# Patient Record
Sex: Female | Born: 1950
Health system: Southern US, Community
[De-identification: ages and names within clinical notes are randomized; demographics above are authoritative.]

## PROBLEM LIST (undated history)

## (undated) DIAGNOSIS — G4730 Sleep apnea, unspecified: Secondary | ICD-10-CM

## (undated) DIAGNOSIS — K219 Gastro-esophageal reflux disease without esophagitis: Secondary | ICD-10-CM

## (undated) DIAGNOSIS — I10 Essential (primary) hypertension: Secondary | ICD-10-CM

## (undated) DIAGNOSIS — F419 Anxiety disorder, unspecified: Secondary | ICD-10-CM

## (undated) DIAGNOSIS — Z789 Other specified health status: Secondary | ICD-10-CM

## (undated) DIAGNOSIS — G20A1 Parkinson's disease without dyskinesia, without mention of fluctuations: Secondary | ICD-10-CM

## (undated) DIAGNOSIS — E079 Disorder of thyroid, unspecified: Secondary | ICD-10-CM

## (undated) DIAGNOSIS — E785 Hyperlipidemia, unspecified: Secondary | ICD-10-CM

## (undated) DIAGNOSIS — E119 Type 2 diabetes mellitus without complications: Secondary | ICD-10-CM

## (undated) DIAGNOSIS — G20 Parkinson's disease: Secondary | ICD-10-CM

## (undated) DIAGNOSIS — M1990 Unspecified osteoarthritis, unspecified site: Secondary | ICD-10-CM

## (undated) HISTORY — PX: THYROIDECTOMY: SHX17

## (undated) HISTORY — PX: LAPAROSCOPIC HYSTERECTOMY: SHX1926

## (undated) HISTORY — PX: TONSILLECTOMY: SUR1361

## (undated) HISTORY — DX: Sleep apnea, unspecified: G47.30

## (undated) HISTORY — PX: CHOLECYSTECTOMY: SHX55

## (undated) HISTORY — DX: Parkinson's disease: G20

## (undated) HISTORY — DX: Anxiety disorder, unspecified: F41.9

## (undated) HISTORY — PX: GALLBLADDER SURGERY: SHX652

## (undated) HISTORY — DX: Unspecified osteoarthritis, unspecified site: M19.90

## (undated) HISTORY — PX: CATARACT EXTRACTION: SUR2

## (undated) HISTORY — DX: Parkinson's disease without dyskinesia, without mention of fluctuations: G20.A1

## (undated) HISTORY — DX: Disorder of thyroid, unspecified: E07.9

## (undated) HISTORY — DX: Type 2 diabetes mellitus without complications: E11.9

## (undated) HISTORY — PX: ABDOMINAL HYSTERECTOMY: SHX81

## (undated) HISTORY — DX: Gastro-esophageal reflux disease without esophagitis: K21.9

## (undated) HISTORY — DX: Essential (primary) hypertension: I10

## (undated) HISTORY — DX: Hyperlipidemia, unspecified: E78.5

---

## 2006-10-10 ENCOUNTER — Ambulatory Visit: Payer: Self-pay | Admitting: Gastroenterology

## 2006-11-28 ENCOUNTER — Ambulatory Visit: Payer: Self-pay | Admitting: Internal Medicine

## 2007-01-10 ENCOUNTER — Ambulatory Visit: Payer: Self-pay | Admitting: Internal Medicine

## 2007-11-22 ENCOUNTER — Ambulatory Visit: Payer: Self-pay | Admitting: Internal Medicine

## 2007-12-04 ENCOUNTER — Ambulatory Visit: Payer: Self-pay | Admitting: Internal Medicine

## 2007-12-04 ENCOUNTER — Ambulatory Visit: Payer: Self-pay

## 2007-12-10 ENCOUNTER — Ambulatory Visit: Payer: Self-pay

## 2008-05-25 ENCOUNTER — Ambulatory Visit: Payer: Self-pay

## 2008-06-11 ENCOUNTER — Ambulatory Visit: Payer: Self-pay | Admitting: Otolaryngology

## 2008-06-17 ENCOUNTER — Ambulatory Visit: Payer: Self-pay | Admitting: Otolaryngology

## 2008-06-18 ENCOUNTER — Ambulatory Visit: Payer: Self-pay | Admitting: Otolaryngology

## 2008-12-23 ENCOUNTER — Ambulatory Visit: Payer: Self-pay | Admitting: Otolaryngology

## 2009-03-19 ENCOUNTER — Ambulatory Visit: Payer: Self-pay | Admitting: Internal Medicine

## 2009-06-14 ENCOUNTER — Ambulatory Visit: Payer: Self-pay | Admitting: Otolaryngology

## 2009-10-21 ENCOUNTER — Ambulatory Visit: Payer: Self-pay | Admitting: Gastroenterology

## 2010-03-21 ENCOUNTER — Ambulatory Visit: Payer: Self-pay | Admitting: Internal Medicine

## 2010-06-22 ENCOUNTER — Ambulatory Visit: Payer: Self-pay | Admitting: Otolaryngology

## 2011-03-23 ENCOUNTER — Ambulatory Visit: Payer: Self-pay | Admitting: Internal Medicine

## 2011-06-23 ENCOUNTER — Ambulatory Visit: Payer: Self-pay | Admitting: Otolaryngology

## 2012-11-20 ENCOUNTER — Encounter: Payer: Self-pay | Admitting: Podiatry

## 2012-11-20 ENCOUNTER — Ambulatory Visit (INDEPENDENT_AMBULATORY_CARE_PROVIDER_SITE_OTHER): Payer: BC Managed Care – PPO | Admitting: Podiatry

## 2012-11-20 VITALS — BP 102/53 | HR 75 | Resp 16 | Ht 64.0 in | Wt 320.0 lb

## 2012-11-20 DIAGNOSIS — M79609 Pain in unspecified limb: Secondary | ICD-10-CM

## 2012-11-20 DIAGNOSIS — B351 Tinea unguium: Secondary | ICD-10-CM

## 2012-11-20 NOTE — Progress Notes (Signed)
Theresa Mills presents today with a chief complaint of painful toenails one through 5 bilaterally. She denies fever chills nausea vomiting muscle aches or pains.  Objective: Vital signs are stable she is alert and oriented x3. There is no erythema edema saline is drainage or odor. Pulses remain palpable bilateral foot. Nails are thick yellow dystrophic clinically mycotic.  Assessment: Pain in limb secondary to onychomycosis.  Plan: Debridement of nails 1 through 5 bilateral covered service secondary to pain. Followup with her in 3 months.

## 2013-01-20 ENCOUNTER — Ambulatory Visit (INDEPENDENT_AMBULATORY_CARE_PROVIDER_SITE_OTHER): Payer: BC Managed Care – PPO | Admitting: Podiatry

## 2013-01-20 ENCOUNTER — Encounter: Payer: Self-pay | Admitting: Podiatry

## 2013-01-20 VITALS — BP 121/68 | HR 70 | Resp 16 | Ht 64.0 in | Wt 320.8 lb

## 2013-01-20 DIAGNOSIS — B351 Tinea unguium: Secondary | ICD-10-CM

## 2013-01-20 DIAGNOSIS — M79609 Pain in unspecified limb: Secondary | ICD-10-CM

## 2013-01-20 NOTE — Progress Notes (Signed)
She presents today with a chief complaint of painful elongated toenails bilateral.  Objective: Vital signs are stable she is alert and oriented x3. Pulses remain palpable bilateral. Nails are thick yellow dystrophic lytic mycotic and painful palpation.  Assessment: Pain in limb secondary to onychomycosis 1 through 5 bilateral.  Plan: Debridement of nails 1 through 5 bilateral is cover service secondary to pain.

## 2013-03-24 ENCOUNTER — Encounter: Payer: Self-pay | Admitting: Podiatry

## 2013-03-24 ENCOUNTER — Ambulatory Visit (INDEPENDENT_AMBULATORY_CARE_PROVIDER_SITE_OTHER): Payer: BC Managed Care – PPO | Admitting: Podiatry

## 2013-03-24 VITALS — BP 113/67 | HR 81 | Resp 18

## 2013-03-24 DIAGNOSIS — L600 Ingrowing nail: Secondary | ICD-10-CM

## 2013-03-24 DIAGNOSIS — B351 Tinea unguium: Secondary | ICD-10-CM

## 2013-03-24 DIAGNOSIS — M79609 Pain in unspecified limb: Secondary | ICD-10-CM

## 2013-03-24 NOTE — Progress Notes (Signed)
Nails and calluses they are hurting about to have been trimmed.  Objective: Vital signs are stable she is alert and oriented x3. Nails are thick yellow dystrophic onychomycotic and painful palpation as well as as debridement.  Assessment: Pain in limb secondary to onychomycosis 1 through 5 bilateral.  Plan: Debridement of nails 1 through 5 bilateral covered service secondary to pain.

## 2013-04-10 ENCOUNTER — Ambulatory Visit: Payer: Self-pay | Admitting: Physician Assistant

## 2013-06-30 ENCOUNTER — Ambulatory Visit (INDEPENDENT_AMBULATORY_CARE_PROVIDER_SITE_OTHER): Payer: BC Managed Care – PPO | Admitting: Podiatry

## 2013-06-30 ENCOUNTER — Encounter: Payer: Self-pay | Admitting: Podiatry

## 2013-06-30 VITALS — BP 136/82 | HR 68 | Resp 16

## 2013-06-30 DIAGNOSIS — B351 Tinea unguium: Secondary | ICD-10-CM

## 2013-06-30 DIAGNOSIS — M79609 Pain in unspecified limb: Secondary | ICD-10-CM

## 2013-06-30 NOTE — Progress Notes (Signed)
She presents today with a chief complaint of painful elongated toenails.  Objective: Pulses are strongly palpable bilateral. Nails are thick yellow dystrophic onychomycotic and painful palpation.  Assessment: Pain in limb secondary to onychomycosis 1 through 5 bilateral.  Plan: Debridement of nails and all reactive hyperkeratotic tissue is cover service secondary to pain.

## 2013-08-22 ENCOUNTER — Ambulatory Visit (INDEPENDENT_AMBULATORY_CARE_PROVIDER_SITE_OTHER): Payer: BC Managed Care – PPO | Admitting: Podiatry

## 2013-08-22 ENCOUNTER — Encounter: Payer: Self-pay | Admitting: Podiatry

## 2013-08-22 DIAGNOSIS — M79609 Pain in unspecified limb: Secondary | ICD-10-CM

## 2013-08-22 DIAGNOSIS — B351 Tinea unguium: Secondary | ICD-10-CM

## 2013-08-22 DIAGNOSIS — M79676 Pain in unspecified toe(s): Secondary | ICD-10-CM

## 2013-08-23 NOTE — Progress Notes (Signed)
She presents today with a chief complaint of painful elongated toenails.  Objective: Nails are thick yellow dystrophic onychomycotic painful palpation. Nails are thick yellow dystrophic with mycotic painful palpation.  Assessment: Pain in limb secondary to onychomycosis 1 through 5 bilateral.  Plan: Debridement nails 1 through 5 bilateral.

## 2013-09-22 ENCOUNTER — Ambulatory Visit: Payer: BC Managed Care – PPO | Admitting: Podiatry

## 2013-10-08 ENCOUNTER — Ambulatory Visit (INDEPENDENT_AMBULATORY_CARE_PROVIDER_SITE_OTHER): Payer: BC Managed Care – PPO | Admitting: Podiatry

## 2013-10-08 DIAGNOSIS — B351 Tinea unguium: Secondary | ICD-10-CM

## 2013-10-08 DIAGNOSIS — M79676 Pain in unspecified toe(s): Secondary | ICD-10-CM

## 2013-10-08 DIAGNOSIS — M79609 Pain in unspecified limb: Secondary | ICD-10-CM

## 2013-10-08 NOTE — Progress Notes (Signed)
She presents today for a followup of her painful toenails. She informs me today that she has had Parkinson's disease for the past 2 years and her tremors are getting worse.  Objective: Vital signs are stable she is alert and oriented x3. She has pain on palpation of toenails one through 5 bilateral. Nails are thick yellow dystrophic onychomycotic and painful.  Assessment: Pain in limb secondary onychomycosis 1 through 5 bilateral.  Plan: Debridement of nails 1 through 5 bilateral covered service secondary to pain debridement all reactive hyperkeratosis.

## 2014-01-07 ENCOUNTER — Ambulatory Visit: Payer: BC Managed Care – PPO | Admitting: Podiatry

## 2014-01-12 DIAGNOSIS — M25561 Pain in right knee: Secondary | ICD-10-CM | POA: Diagnosis not present

## 2014-01-12 DIAGNOSIS — R05 Cough: Secondary | ICD-10-CM | POA: Diagnosis not present

## 2014-01-12 DIAGNOSIS — R55 Syncope and collapse: Secondary | ICD-10-CM | POA: Diagnosis not present

## 2014-01-12 DIAGNOSIS — W19XXXA Unspecified fall, initial encounter: Secondary | ICD-10-CM | POA: Diagnosis not present

## 2014-01-12 DIAGNOSIS — S0083XA Contusion of other part of head, initial encounter: Secondary | ICD-10-CM | POA: Diagnosis not present

## 2014-01-12 DIAGNOSIS — S0081XA Abrasion of other part of head, initial encounter: Secondary | ICD-10-CM | POA: Diagnosis not present

## 2014-01-12 DIAGNOSIS — R0602 Shortness of breath: Secondary | ICD-10-CM | POA: Diagnosis not present

## 2014-01-12 DIAGNOSIS — R079 Chest pain, unspecified: Secondary | ICD-10-CM | POA: Diagnosis not present

## 2014-01-12 LAB — COMPREHENSIVE METABOLIC PANEL
ALK PHOS: 45 U/L — AB
ALT: 24 U/L
ANION GAP: 8 (ref 7–16)
Albumin: 3.2 g/dL — ABNORMAL LOW (ref 3.4–5.0)
BUN: 20 mg/dL — ABNORMAL HIGH (ref 7–18)
Bilirubin,Total: 0.7 mg/dL (ref 0.2–1.0)
CO2: 28 mmol/L (ref 21–32)
Calcium, Total: 8.9 mg/dL (ref 8.5–10.1)
Chloride: 104 mmol/L (ref 98–107)
Creatinine: 0.42 mg/dL — ABNORMAL LOW (ref 0.60–1.30)
EGFR (Non-African Amer.): >60
Glucose: 111 mg/dL — ABNORMAL HIGH (ref 65–99)
OSMOLALITY: 283 (ref 275–301)
POTASSIUM: 4 mmol/L (ref 3.5–5.1)
SGOT(AST): 33 U/L (ref 15–37)
Sodium: 140 mmol/L (ref 136–145)
Total Protein: 7.3 g/dL (ref 6.4–8.2)

## 2014-01-12 LAB — CBC WITH DIFFERENTIAL/PLATELET
BASOS ABS: 0.1 10*3/uL (ref 0.0–0.1)
Basophil %: 0.6 %
Eosinophil #: 0.1 10*3/uL (ref 0.0–0.7)
Eosinophil %: 1 %
HCT: 48.3 % — ABNORMAL HIGH (ref 35.0–47.0)
HGB: 15.6 g/dL (ref 12.0–16.0)
Lymphocyte #: 2 10*3/uL (ref 1.0–3.6)
Lymphocyte %: 21.4 %
MCH: 30.3 pg (ref 26.0–34.0)
MCHC: 32.4 g/dL (ref 32.0–36.0)
MCV: 94 fL (ref 80–100)
Monocyte #: 0.6 x10 3/mm (ref 0.2–0.9)
Monocyte %: 7 %
NEUTROS PCT: 70 %
Neutrophil #: 6.4 10*3/uL (ref 1.4–6.5)
PLATELETS: 171 10*3/uL (ref 150–440)
RBC: 5.15 10*6/uL (ref 3.80–5.20)
RDW: 14 % (ref 11.5–14.5)
WBC: 9.1 10*3/uL (ref 3.6–11.0)

## 2014-01-12 LAB — LIPASE, BLOOD: LIPASE: 104 U/L (ref 73–393)

## 2014-01-12 LAB — TROPONIN I

## 2014-01-12 LAB — PROTIME-INR
INR: 1.1
Prothrombin Time: 14.3 secs (ref 11.5–14.7)

## 2014-01-12 LAB — MAGNESIUM: Magnesium: 1.8 mg/dL

## 2014-01-12 LAB — ETHANOL: Ethanol: <3 mg/dL

## 2014-01-13 ENCOUNTER — Inpatient Hospital Stay: Payer: Self-pay | Admitting: Specialist

## 2014-01-13 DIAGNOSIS — E785 Hyperlipidemia, unspecified: Secondary | ICD-10-CM | POA: Diagnosis present

## 2014-01-13 DIAGNOSIS — N390 Urinary tract infection, site not specified: Secondary | ICD-10-CM | POA: Diagnosis not present

## 2014-01-13 DIAGNOSIS — Z7983 Long term (current) use of bisphosphonates: Secondary | ICD-10-CM | POA: Diagnosis not present

## 2014-01-13 DIAGNOSIS — G2581 Restless legs syndrome: Secondary | ICD-10-CM | POA: Diagnosis present

## 2014-01-13 DIAGNOSIS — Z6841 Body Mass Index (BMI) 40.0 and over, adult: Secondary | ICD-10-CM | POA: Diagnosis not present

## 2014-01-13 DIAGNOSIS — E039 Hypothyroidism, unspecified: Secondary | ICD-10-CM | POA: Diagnosis not present

## 2014-01-13 DIAGNOSIS — R55 Syncope and collapse: Secondary | ICD-10-CM | POA: Diagnosis not present

## 2014-01-13 DIAGNOSIS — M8580 Other specified disorders of bone density and structure, unspecified site: Secondary | ICD-10-CM | POA: Diagnosis not present

## 2014-01-13 DIAGNOSIS — I951 Orthostatic hypotension: Secondary | ICD-10-CM | POA: Diagnosis present

## 2014-01-13 DIAGNOSIS — F418 Other specified anxiety disorders: Secondary | ICD-10-CM | POA: Diagnosis present

## 2014-01-13 DIAGNOSIS — Z8542 Personal history of malignant neoplasm of other parts of uterus: Secondary | ICD-10-CM | POA: Diagnosis not present

## 2014-01-13 DIAGNOSIS — B369 Superficial mycosis, unspecified: Secondary | ICD-10-CM | POA: Diagnosis present

## 2014-01-13 DIAGNOSIS — S0081XA Abrasion of other part of head, initial encounter: Secondary | ICD-10-CM | POA: Diagnosis not present

## 2014-01-13 DIAGNOSIS — Z8249 Family history of ischemic heart disease and other diseases of the circulatory system: Secondary | ICD-10-CM | POA: Diagnosis not present

## 2014-01-13 DIAGNOSIS — I10 Essential (primary) hypertension: Secondary | ICD-10-CM | POA: Diagnosis not present

## 2014-01-13 DIAGNOSIS — S0083XA Contusion of other part of head, initial encounter: Secondary | ICD-10-CM | POA: Diagnosis not present

## 2014-01-13 DIAGNOSIS — E119 Type 2 diabetes mellitus without complications: Secondary | ICD-10-CM | POA: Diagnosis not present

## 2014-01-13 DIAGNOSIS — R531 Weakness: Secondary | ICD-10-CM | POA: Diagnosis not present

## 2014-01-13 LAB — URINALYSIS, COMPLETE
BACTERIA: NONE SEEN
Bilirubin,UR: NEGATIVE
Glucose,UR: >=500 mg/dL (ref 0–75)
NITRITE: NEGATIVE
PH: 6 (ref 4.5–8.0)
Protein: NEGATIVE
RBC,UR: 40 /HPF (ref 0–5)
SPECIFIC GRAVITY: 1.021 (ref 1.003–1.030)
Squamous Epithelial: 13

## 2014-01-14 DIAGNOSIS — I10 Essential (primary) hypertension: Secondary | ICD-10-CM | POA: Diagnosis not present

## 2014-01-14 DIAGNOSIS — N390 Urinary tract infection, site not specified: Secondary | ICD-10-CM | POA: Diagnosis not present

## 2014-01-14 DIAGNOSIS — E039 Hypothyroidism, unspecified: Secondary | ICD-10-CM | POA: Diagnosis not present

## 2014-01-14 DIAGNOSIS — R531 Weakness: Secondary | ICD-10-CM | POA: Diagnosis not present

## 2014-01-14 DIAGNOSIS — R55 Syncope and collapse: Secondary | ICD-10-CM | POA: Diagnosis not present

## 2014-01-14 LAB — TSH: Thyroid Stimulating Horm: 3.04 u[IU]/mL

## 2014-01-14 LAB — URINE CULTURE

## 2014-01-14 LAB — HEMOGLOBIN A1C: Hemoglobin A1C: 6.3 % (ref 4.2–6.3)

## 2014-01-21 ENCOUNTER — Ambulatory Visit (INDEPENDENT_AMBULATORY_CARE_PROVIDER_SITE_OTHER): Payer: Medicare Other | Admitting: Podiatry

## 2014-01-21 DIAGNOSIS — E1142 Type 2 diabetes mellitus with diabetic polyneuropathy: Secondary | ICD-10-CM

## 2014-01-21 DIAGNOSIS — M79676 Pain in unspecified toe(s): Secondary | ICD-10-CM | POA: Diagnosis not present

## 2014-01-21 DIAGNOSIS — Q828 Other specified congenital malformations of skin: Secondary | ICD-10-CM

## 2014-01-21 DIAGNOSIS — B351 Tinea unguium: Secondary | ICD-10-CM

## 2014-01-21 NOTE — Progress Notes (Signed)
She presents today for routine nail debridement and slight C by getting a pair of diabetic shoes.  Objective: Vital signs are stable she is alert and oriented 3 pulses are diminished bilateral neurologic sensorium is decreased about per Semmes-Weinstein monofilament bilateral. She has a history of hammertoe deformities a history of pre-also give lesions and corns and calluses to the distal aspect of the second and third digits right foot.  Assessment: Diabetes mellitus with pain in limb secondary to onychomycosis and poor keratomas bilateral diabetic peripheral neuropathy and angiopathy. Pes planus right.  Plan: Debrided reactive hyperkeratoses and nails 1 through 5 bilateral. She was scanned today for set of insoles for diabetic shoes.

## 2014-01-29 ENCOUNTER — Telehealth: Payer: Self-pay | Admitting: *Deleted

## 2014-01-29 NOTE — Telephone Encounter (Signed)
OPENED IN ERROR

## 2014-01-30 DIAGNOSIS — G4710 Hypersomnia, unspecified: Secondary | ICD-10-CM | POA: Diagnosis not present

## 2014-01-30 DIAGNOSIS — E1165 Type 2 diabetes mellitus with hyperglycemia: Secondary | ICD-10-CM | POA: Diagnosis not present

## 2014-01-30 DIAGNOSIS — G20 Parkinson's disease: Secondary | ICD-10-CM | POA: Diagnosis not present

## 2014-01-30 DIAGNOSIS — N390 Urinary tract infection, site not specified: Secondary | ICD-10-CM | POA: Diagnosis not present

## 2014-01-30 DIAGNOSIS — R55 Syncope and collapse: Secondary | ICD-10-CM | POA: Diagnosis not present

## 2014-02-12 DIAGNOSIS — R55 Syncope and collapse: Secondary | ICD-10-CM | POA: Diagnosis not present

## 2014-03-04 ENCOUNTER — Emergency Department: Payer: Self-pay | Admitting: Emergency Medicine

## 2014-03-04 DIAGNOSIS — I10 Essential (primary) hypertension: Secondary | ICD-10-CM | POA: Diagnosis not present

## 2014-03-04 DIAGNOSIS — E119 Type 2 diabetes mellitus without complications: Secondary | ICD-10-CM | POA: Diagnosis not present

## 2014-03-04 DIAGNOSIS — R5383 Other fatigue: Secondary | ICD-10-CM | POA: Diagnosis not present

## 2014-03-04 DIAGNOSIS — F068 Other specified mental disorders due to known physiological condition: Secondary | ICD-10-CM | POA: Diagnosis not present

## 2014-03-04 DIAGNOSIS — M2550 Pain in unspecified joint: Secondary | ICD-10-CM | POA: Diagnosis not present

## 2014-03-04 DIAGNOSIS — E1165 Type 2 diabetes mellitus with hyperglycemia: Secondary | ICD-10-CM | POA: Diagnosis not present

## 2014-03-04 DIAGNOSIS — M6281 Muscle weakness (generalized): Secondary | ICD-10-CM | POA: Diagnosis not present

## 2014-03-04 DIAGNOSIS — R4182 Altered mental status, unspecified: Secondary | ICD-10-CM | POA: Diagnosis not present

## 2014-03-04 DIAGNOSIS — R531 Weakness: Secondary | ICD-10-CM | POA: Diagnosis not present

## 2014-03-04 DIAGNOSIS — R55 Syncope and collapse: Secondary | ICD-10-CM | POA: Diagnosis not present

## 2014-03-04 DIAGNOSIS — J069 Acute upper respiratory infection, unspecified: Secondary | ICD-10-CM | POA: Diagnosis not present

## 2014-03-04 DIAGNOSIS — N390 Urinary tract infection, site not specified: Secondary | ICD-10-CM | POA: Diagnosis not present

## 2014-03-04 DIAGNOSIS — J9811 Atelectasis: Secondary | ICD-10-CM | POA: Diagnosis not present

## 2014-03-12 DIAGNOSIS — N390 Urinary tract infection, site not specified: Secondary | ICD-10-CM | POA: Diagnosis not present

## 2014-03-12 DIAGNOSIS — G259 Extrapyramidal and movement disorder, unspecified: Secondary | ICD-10-CM | POA: Diagnosis not present

## 2014-03-12 DIAGNOSIS — E1165 Type 2 diabetes mellitus with hyperglycemia: Secondary | ICD-10-CM | POA: Diagnosis not present

## 2014-03-12 DIAGNOSIS — I10 Essential (primary) hypertension: Secondary | ICD-10-CM | POA: Diagnosis not present

## 2014-03-12 DIAGNOSIS — F411 Generalized anxiety disorder: Secondary | ICD-10-CM | POA: Diagnosis not present

## 2014-03-12 DIAGNOSIS — F330 Major depressive disorder, recurrent, mild: Secondary | ICD-10-CM | POA: Diagnosis not present

## 2014-03-14 DIAGNOSIS — E669 Obesity, unspecified: Secondary | ICD-10-CM | POA: Diagnosis not present

## 2014-03-14 DIAGNOSIS — F329 Major depressive disorder, single episode, unspecified: Secondary | ICD-10-CM | POA: Diagnosis not present

## 2014-03-14 DIAGNOSIS — Z87440 Personal history of urinary (tract) infections: Secondary | ICD-10-CM | POA: Diagnosis not present

## 2014-03-14 DIAGNOSIS — M6281 Muscle weakness (generalized): Secondary | ICD-10-CM | POA: Diagnosis not present

## 2014-03-14 DIAGNOSIS — R262 Difficulty in walking, not elsewhere classified: Secondary | ICD-10-CM | POA: Diagnosis not present

## 2014-03-14 DIAGNOSIS — G20 Parkinson's disease: Secondary | ICD-10-CM | POA: Diagnosis not present

## 2014-03-14 DIAGNOSIS — E119 Type 2 diabetes mellitus without complications: Secondary | ICD-10-CM | POA: Diagnosis not present

## 2014-03-17 DIAGNOSIS — F329 Major depressive disorder, single episode, unspecified: Secondary | ICD-10-CM | POA: Diagnosis not present

## 2014-03-17 DIAGNOSIS — M6281 Muscle weakness (generalized): Secondary | ICD-10-CM | POA: Diagnosis not present

## 2014-03-17 DIAGNOSIS — E119 Type 2 diabetes mellitus without complications: Secondary | ICD-10-CM | POA: Diagnosis not present

## 2014-03-17 DIAGNOSIS — R262 Difficulty in walking, not elsewhere classified: Secondary | ICD-10-CM | POA: Diagnosis not present

## 2014-03-17 DIAGNOSIS — G20 Parkinson's disease: Secondary | ICD-10-CM | POA: Diagnosis not present

## 2014-03-17 DIAGNOSIS — E669 Obesity, unspecified: Secondary | ICD-10-CM | POA: Diagnosis not present

## 2014-03-18 DIAGNOSIS — E669 Obesity, unspecified: Secondary | ICD-10-CM | POA: Diagnosis not present

## 2014-03-18 DIAGNOSIS — F329 Major depressive disorder, single episode, unspecified: Secondary | ICD-10-CM | POA: Diagnosis not present

## 2014-03-18 DIAGNOSIS — R262 Difficulty in walking, not elsewhere classified: Secondary | ICD-10-CM | POA: Diagnosis not present

## 2014-03-18 DIAGNOSIS — G20 Parkinson's disease: Secondary | ICD-10-CM | POA: Diagnosis not present

## 2014-03-18 DIAGNOSIS — E119 Type 2 diabetes mellitus without complications: Secondary | ICD-10-CM | POA: Diagnosis not present

## 2014-03-18 DIAGNOSIS — M6281 Muscle weakness (generalized): Secondary | ICD-10-CM | POA: Diagnosis not present

## 2014-03-19 DIAGNOSIS — G3184 Mild cognitive impairment, so stated: Secondary | ICD-10-CM | POA: Diagnosis not present

## 2014-03-19 DIAGNOSIS — G20 Parkinson's disease: Secondary | ICD-10-CM | POA: Diagnosis not present

## 2014-03-19 DIAGNOSIS — F329 Major depressive disorder, single episode, unspecified: Secondary | ICD-10-CM | POA: Diagnosis not present

## 2014-03-19 DIAGNOSIS — E538 Deficiency of other specified B group vitamins: Secondary | ICD-10-CM | POA: Diagnosis not present

## 2014-03-20 DIAGNOSIS — M6281 Muscle weakness (generalized): Secondary | ICD-10-CM | POA: Diagnosis not present

## 2014-03-20 DIAGNOSIS — R262 Difficulty in walking, not elsewhere classified: Secondary | ICD-10-CM | POA: Diagnosis not present

## 2014-03-20 DIAGNOSIS — E669 Obesity, unspecified: Secondary | ICD-10-CM | POA: Diagnosis not present

## 2014-03-20 DIAGNOSIS — E119 Type 2 diabetes mellitus without complications: Secondary | ICD-10-CM | POA: Diagnosis not present

## 2014-03-20 DIAGNOSIS — G20 Parkinson's disease: Secondary | ICD-10-CM | POA: Diagnosis not present

## 2014-03-20 DIAGNOSIS — F329 Major depressive disorder, single episode, unspecified: Secondary | ICD-10-CM | POA: Diagnosis not present

## 2014-03-23 DIAGNOSIS — R262 Difficulty in walking, not elsewhere classified: Secondary | ICD-10-CM | POA: Diagnosis not present

## 2014-03-23 DIAGNOSIS — F329 Major depressive disorder, single episode, unspecified: Secondary | ICD-10-CM | POA: Diagnosis not present

## 2014-03-23 DIAGNOSIS — E669 Obesity, unspecified: Secondary | ICD-10-CM | POA: Diagnosis not present

## 2014-03-23 DIAGNOSIS — M6281 Muscle weakness (generalized): Secondary | ICD-10-CM | POA: Diagnosis not present

## 2014-03-23 DIAGNOSIS — G20 Parkinson's disease: Secondary | ICD-10-CM | POA: Diagnosis not present

## 2014-03-23 DIAGNOSIS — E119 Type 2 diabetes mellitus without complications: Secondary | ICD-10-CM | POA: Diagnosis not present

## 2014-03-24 DIAGNOSIS — G20 Parkinson's disease: Secondary | ICD-10-CM | POA: Diagnosis not present

## 2014-03-24 DIAGNOSIS — G4700 Insomnia, unspecified: Secondary | ICD-10-CM | POA: Diagnosis not present

## 2014-03-24 DIAGNOSIS — F330 Major depressive disorder, recurrent, mild: Secondary | ICD-10-CM | POA: Diagnosis not present

## 2014-03-24 DIAGNOSIS — E039 Hypothyroidism, unspecified: Secondary | ICD-10-CM | POA: Diagnosis not present

## 2014-03-24 DIAGNOSIS — R4182 Altered mental status, unspecified: Secondary | ICD-10-CM | POA: Diagnosis not present

## 2014-03-24 DIAGNOSIS — Z0001 Encounter for general adult medical examination with abnormal findings: Secondary | ICD-10-CM | POA: Diagnosis not present

## 2014-03-24 DIAGNOSIS — I952 Hypotension due to drugs: Secondary | ICD-10-CM | POA: Diagnosis not present

## 2014-03-24 DIAGNOSIS — E119 Type 2 diabetes mellitus without complications: Secondary | ICD-10-CM | POA: Diagnosis not present

## 2014-03-26 DIAGNOSIS — M6281 Muscle weakness (generalized): Secondary | ICD-10-CM | POA: Diagnosis not present

## 2014-03-26 DIAGNOSIS — E669 Obesity, unspecified: Secondary | ICD-10-CM | POA: Diagnosis not present

## 2014-03-26 DIAGNOSIS — E119 Type 2 diabetes mellitus without complications: Secondary | ICD-10-CM | POA: Diagnosis not present

## 2014-03-26 DIAGNOSIS — R262 Difficulty in walking, not elsewhere classified: Secondary | ICD-10-CM | POA: Diagnosis not present

## 2014-03-26 DIAGNOSIS — F329 Major depressive disorder, single episode, unspecified: Secondary | ICD-10-CM | POA: Diagnosis not present

## 2014-03-26 DIAGNOSIS — G20 Parkinson's disease: Secondary | ICD-10-CM | POA: Diagnosis not present

## 2014-03-31 DIAGNOSIS — G20 Parkinson's disease: Secondary | ICD-10-CM | POA: Diagnosis not present

## 2014-03-31 DIAGNOSIS — E669 Obesity, unspecified: Secondary | ICD-10-CM | POA: Diagnosis not present

## 2014-03-31 DIAGNOSIS — M6281 Muscle weakness (generalized): Secondary | ICD-10-CM | POA: Diagnosis not present

## 2014-03-31 DIAGNOSIS — F329 Major depressive disorder, single episode, unspecified: Secondary | ICD-10-CM | POA: Diagnosis not present

## 2014-03-31 DIAGNOSIS — E119 Type 2 diabetes mellitus without complications: Secondary | ICD-10-CM | POA: Diagnosis not present

## 2014-03-31 DIAGNOSIS — R262 Difficulty in walking, not elsewhere classified: Secondary | ICD-10-CM | POA: Diagnosis not present

## 2014-04-01 DIAGNOSIS — M6281 Muscle weakness (generalized): Secondary | ICD-10-CM | POA: Diagnosis not present

## 2014-04-01 DIAGNOSIS — E669 Obesity, unspecified: Secondary | ICD-10-CM | POA: Diagnosis not present

## 2014-04-01 DIAGNOSIS — E119 Type 2 diabetes mellitus without complications: Secondary | ICD-10-CM | POA: Diagnosis not present

## 2014-04-01 DIAGNOSIS — R262 Difficulty in walking, not elsewhere classified: Secondary | ICD-10-CM | POA: Diagnosis not present

## 2014-04-01 DIAGNOSIS — G20 Parkinson's disease: Secondary | ICD-10-CM | POA: Diagnosis not present

## 2014-04-01 DIAGNOSIS — F329 Major depressive disorder, single episode, unspecified: Secondary | ICD-10-CM | POA: Diagnosis not present

## 2014-04-02 DIAGNOSIS — E119 Type 2 diabetes mellitus without complications: Secondary | ICD-10-CM | POA: Diagnosis not present

## 2014-04-02 DIAGNOSIS — F329 Major depressive disorder, single episode, unspecified: Secondary | ICD-10-CM | POA: Diagnosis not present

## 2014-04-02 DIAGNOSIS — R262 Difficulty in walking, not elsewhere classified: Secondary | ICD-10-CM | POA: Diagnosis not present

## 2014-04-02 DIAGNOSIS — M6281 Muscle weakness (generalized): Secondary | ICD-10-CM | POA: Diagnosis not present

## 2014-04-02 DIAGNOSIS — G20 Parkinson's disease: Secondary | ICD-10-CM | POA: Diagnosis not present

## 2014-04-02 DIAGNOSIS — E669 Obesity, unspecified: Secondary | ICD-10-CM | POA: Diagnosis not present

## 2014-04-03 ENCOUNTER — Ambulatory Visit: Payer: Self-pay | Admitting: Neurology

## 2014-04-03 DIAGNOSIS — G939 Disorder of brain, unspecified: Secondary | ICD-10-CM | POA: Diagnosis not present

## 2014-04-03 DIAGNOSIS — G3189 Other specified degenerative diseases of nervous system: Secondary | ICD-10-CM | POA: Diagnosis not present

## 2014-04-03 DIAGNOSIS — R413 Other amnesia: Secondary | ICD-10-CM | POA: Diagnosis not present

## 2014-04-03 DIAGNOSIS — I679 Cerebrovascular disease, unspecified: Secondary | ICD-10-CM | POA: Diagnosis not present

## 2014-04-03 DIAGNOSIS — G3184 Mild cognitive impairment, so stated: Secondary | ICD-10-CM | POA: Diagnosis not present

## 2014-04-03 DIAGNOSIS — R251 Tremor, unspecified: Secondary | ICD-10-CM | POA: Diagnosis not present

## 2014-04-03 DIAGNOSIS — R262 Difficulty in walking, not elsewhere classified: Secondary | ICD-10-CM | POA: Diagnosis not present

## 2014-04-03 DIAGNOSIS — F0280 Dementia in other diseases classified elsewhere without behavioral disturbance: Secondary | ICD-10-CM | POA: Diagnosis not present

## 2014-04-07 DIAGNOSIS — R5383 Other fatigue: Secondary | ICD-10-CM | POA: Diagnosis not present

## 2014-04-07 DIAGNOSIS — R262 Difficulty in walking, not elsewhere classified: Secondary | ICD-10-CM | POA: Diagnosis not present

## 2014-04-07 DIAGNOSIS — G20 Parkinson's disease: Secondary | ICD-10-CM | POA: Diagnosis not present

## 2014-04-07 DIAGNOSIS — E669 Obesity, unspecified: Secondary | ICD-10-CM | POA: Diagnosis not present

## 2014-04-07 DIAGNOSIS — F329 Major depressive disorder, single episode, unspecified: Secondary | ICD-10-CM | POA: Diagnosis not present

## 2014-04-07 DIAGNOSIS — M6281 Muscle weakness (generalized): Secondary | ICD-10-CM | POA: Diagnosis not present

## 2014-04-07 DIAGNOSIS — E119 Type 2 diabetes mellitus without complications: Secondary | ICD-10-CM | POA: Diagnosis not present

## 2014-04-08 DIAGNOSIS — M6281 Muscle weakness (generalized): Secondary | ICD-10-CM | POA: Diagnosis not present

## 2014-04-08 DIAGNOSIS — G20 Parkinson's disease: Secondary | ICD-10-CM | POA: Diagnosis not present

## 2014-04-08 DIAGNOSIS — E669 Obesity, unspecified: Secondary | ICD-10-CM | POA: Diagnosis not present

## 2014-04-08 DIAGNOSIS — R262 Difficulty in walking, not elsewhere classified: Secondary | ICD-10-CM | POA: Diagnosis not present

## 2014-04-08 DIAGNOSIS — F329 Major depressive disorder, single episode, unspecified: Secondary | ICD-10-CM | POA: Diagnosis not present

## 2014-04-08 DIAGNOSIS — E119 Type 2 diabetes mellitus without complications: Secondary | ICD-10-CM | POA: Diagnosis not present

## 2014-04-09 DIAGNOSIS — F329 Major depressive disorder, single episode, unspecified: Secondary | ICD-10-CM | POA: Diagnosis not present

## 2014-04-09 DIAGNOSIS — R262 Difficulty in walking, not elsewhere classified: Secondary | ICD-10-CM | POA: Diagnosis not present

## 2014-04-09 DIAGNOSIS — M6281 Muscle weakness (generalized): Secondary | ICD-10-CM | POA: Diagnosis not present

## 2014-04-09 DIAGNOSIS — E669 Obesity, unspecified: Secondary | ICD-10-CM | POA: Diagnosis not present

## 2014-04-09 DIAGNOSIS — G20 Parkinson's disease: Secondary | ICD-10-CM | POA: Diagnosis not present

## 2014-04-09 DIAGNOSIS — E119 Type 2 diabetes mellitus without complications: Secondary | ICD-10-CM | POA: Diagnosis not present

## 2014-04-10 DIAGNOSIS — F329 Major depressive disorder, single episode, unspecified: Secondary | ICD-10-CM | POA: Diagnosis not present

## 2014-04-10 DIAGNOSIS — E119 Type 2 diabetes mellitus without complications: Secondary | ICD-10-CM | POA: Diagnosis not present

## 2014-04-10 DIAGNOSIS — R262 Difficulty in walking, not elsewhere classified: Secondary | ICD-10-CM | POA: Diagnosis not present

## 2014-04-10 DIAGNOSIS — E669 Obesity, unspecified: Secondary | ICD-10-CM | POA: Diagnosis not present

## 2014-04-10 DIAGNOSIS — M6281 Muscle weakness (generalized): Secondary | ICD-10-CM | POA: Diagnosis not present

## 2014-04-10 DIAGNOSIS — G20 Parkinson's disease: Secondary | ICD-10-CM | POA: Diagnosis not present

## 2014-04-14 DIAGNOSIS — E669 Obesity, unspecified: Secondary | ICD-10-CM | POA: Diagnosis not present

## 2014-04-14 DIAGNOSIS — E119 Type 2 diabetes mellitus without complications: Secondary | ICD-10-CM | POA: Diagnosis not present

## 2014-04-14 DIAGNOSIS — R262 Difficulty in walking, not elsewhere classified: Secondary | ICD-10-CM | POA: Diagnosis not present

## 2014-04-14 DIAGNOSIS — F329 Major depressive disorder, single episode, unspecified: Secondary | ICD-10-CM | POA: Diagnosis not present

## 2014-04-14 DIAGNOSIS — M6281 Muscle weakness (generalized): Secondary | ICD-10-CM | POA: Diagnosis not present

## 2014-04-14 DIAGNOSIS — G20 Parkinson's disease: Secondary | ICD-10-CM | POA: Diagnosis not present

## 2014-04-15 ENCOUNTER — Ambulatory Visit: Payer: Medicare Other

## 2014-04-16 DIAGNOSIS — M6281 Muscle weakness (generalized): Secondary | ICD-10-CM | POA: Diagnosis not present

## 2014-04-16 DIAGNOSIS — G20 Parkinson's disease: Secondary | ICD-10-CM | POA: Diagnosis not present

## 2014-04-16 DIAGNOSIS — R262 Difficulty in walking, not elsewhere classified: Secondary | ICD-10-CM | POA: Diagnosis not present

## 2014-04-16 DIAGNOSIS — E669 Obesity, unspecified: Secondary | ICD-10-CM | POA: Diagnosis not present

## 2014-04-16 DIAGNOSIS — F329 Major depressive disorder, single episode, unspecified: Secondary | ICD-10-CM | POA: Diagnosis not present

## 2014-04-16 DIAGNOSIS — E119 Type 2 diabetes mellitus without complications: Secondary | ICD-10-CM | POA: Diagnosis not present

## 2014-04-20 ENCOUNTER — Ambulatory Visit: Payer: Medicare Other

## 2014-04-21 DIAGNOSIS — E669 Obesity, unspecified: Secondary | ICD-10-CM | POA: Diagnosis not present

## 2014-04-21 DIAGNOSIS — G20 Parkinson's disease: Secondary | ICD-10-CM | POA: Diagnosis not present

## 2014-04-21 DIAGNOSIS — M6281 Muscle weakness (generalized): Secondary | ICD-10-CM | POA: Diagnosis not present

## 2014-04-21 DIAGNOSIS — E119 Type 2 diabetes mellitus without complications: Secondary | ICD-10-CM | POA: Diagnosis not present

## 2014-04-21 DIAGNOSIS — R262 Difficulty in walking, not elsewhere classified: Secondary | ICD-10-CM | POA: Diagnosis not present

## 2014-04-21 DIAGNOSIS — F329 Major depressive disorder, single episode, unspecified: Secondary | ICD-10-CM | POA: Diagnosis not present

## 2014-04-22 DIAGNOSIS — E669 Obesity, unspecified: Secondary | ICD-10-CM | POA: Diagnosis not present

## 2014-04-22 DIAGNOSIS — G20 Parkinson's disease: Secondary | ICD-10-CM | POA: Diagnosis not present

## 2014-04-22 DIAGNOSIS — F329 Major depressive disorder, single episode, unspecified: Secondary | ICD-10-CM | POA: Diagnosis not present

## 2014-04-22 DIAGNOSIS — R262 Difficulty in walking, not elsewhere classified: Secondary | ICD-10-CM | POA: Diagnosis not present

## 2014-04-22 DIAGNOSIS — M6281 Muscle weakness (generalized): Secondary | ICD-10-CM | POA: Diagnosis not present

## 2014-04-22 DIAGNOSIS — E119 Type 2 diabetes mellitus without complications: Secondary | ICD-10-CM | POA: Diagnosis not present

## 2014-04-23 DIAGNOSIS — R262 Difficulty in walking, not elsewhere classified: Secondary | ICD-10-CM | POA: Diagnosis not present

## 2014-04-23 DIAGNOSIS — E119 Type 2 diabetes mellitus without complications: Secondary | ICD-10-CM | POA: Diagnosis not present

## 2014-04-23 DIAGNOSIS — M6281 Muscle weakness (generalized): Secondary | ICD-10-CM | POA: Diagnosis not present

## 2014-04-23 DIAGNOSIS — F329 Major depressive disorder, single episode, unspecified: Secondary | ICD-10-CM | POA: Diagnosis not present

## 2014-04-23 DIAGNOSIS — E669 Obesity, unspecified: Secondary | ICD-10-CM | POA: Diagnosis not present

## 2014-04-23 DIAGNOSIS — G20 Parkinson's disease: Secondary | ICD-10-CM | POA: Diagnosis not present

## 2014-05-07 DIAGNOSIS — G259 Extrapyramidal and movement disorder, unspecified: Secondary | ICD-10-CM | POA: Diagnosis not present

## 2014-05-07 DIAGNOSIS — R0602 Shortness of breath: Secondary | ICD-10-CM | POA: Diagnosis not present

## 2014-05-07 DIAGNOSIS — G20 Parkinson's disease: Secondary | ICD-10-CM | POA: Diagnosis not present

## 2014-05-07 DIAGNOSIS — D519 Vitamin B12 deficiency anemia, unspecified: Secondary | ICD-10-CM | POA: Diagnosis not present

## 2014-05-07 DIAGNOSIS — E119 Type 2 diabetes mellitus without complications: Secondary | ICD-10-CM | POA: Diagnosis not present

## 2014-05-07 DIAGNOSIS — M6281 Muscle weakness (generalized): Secondary | ICD-10-CM | POA: Diagnosis not present

## 2014-05-07 DIAGNOSIS — F330 Major depressive disorder, recurrent, mild: Secondary | ICD-10-CM | POA: Diagnosis not present

## 2014-05-07 DIAGNOSIS — R5383 Other fatigue: Secondary | ICD-10-CM | POA: Diagnosis not present

## 2014-05-07 DIAGNOSIS — E782 Mixed hyperlipidemia: Secondary | ICD-10-CM | POA: Diagnosis not present

## 2014-05-12 ENCOUNTER — Telehealth: Payer: Self-pay | Admitting: Podiatry

## 2014-05-12 NOTE — Telephone Encounter (Signed)
CALLED PATIENT TO MOVE APPOINTMENT FOR SCAN, PATIENT PHONE NOT WORKING

## 2014-05-13 ENCOUNTER — Ambulatory Visit (INDEPENDENT_AMBULATORY_CARE_PROVIDER_SITE_OTHER): Payer: Medicare Other | Admitting: Podiatry

## 2014-05-13 DIAGNOSIS — B351 Tinea unguium: Secondary | ICD-10-CM

## 2014-05-13 DIAGNOSIS — M79676 Pain in unspecified toe(s): Secondary | ICD-10-CM | POA: Diagnosis not present

## 2014-05-13 NOTE — Progress Notes (Signed)
Patient presents to the office today with a chief complaint of painful elongated toenails.  Objective: Pulses are palpable bilateral. Nails are thick yellow dystrophic clinically mycotic and painful palpation.  Assessment: Pain in limb secondary to onychomycosis 1 through 5 bilateral.  Plan: Debridement of nails 1 through 5 bilateral covered service secondary to pain.  

## 2014-05-16 NOTE — Discharge Summary (Signed)
PATIENT NAME:  Theresa Mills, Theresa Mills MR#:  [id number] DATE OF BIRTH:  [birth year]  DATE OF ADMISSION:  01/13/2014 DATE OF DISCHARGE: 01/14/2014.   For a detailed note, please see the history and physical done on admission by Dr. Norva Riffle. Diamond.   DIAGNOSES AT DISCHARGE: As follows:  1.  Syncope/fall likely vasovagal in nature probably related to deconditioning from morbid obesity.  2.  Urinary tract infection.  3.  Hypertension.  4.  Hypothyroidism.  5.  Hyperlipidemia.   The patient is being discharged with home health nursing and physical therapy services.   ACTIVITY: As tolerated.   DISPOSITION: The patient is being discharged on a low-sodium, low-fat, carb -controlled diet. Follow up is in the next one to two weeks with Dr. Clayborn Bigness.  DISCHARGE MEDICATIONS:  Simvastatin 40 mg at bedtime, Celebrex 200 mg b.i.d., Celexa 20 mg daily, pramipexole 0.5 mg t.i.d., HCTZ, losartan 12.5/50 one tablet daily, Coreg 6.25 mg b.i.d., verapamil 180 mg at bedtime, Synthroid 50 mcg daily, Janumet 50/500 mg 1 tab b.i.d., omeprazole 40 mg daily, fexofenadine 180 mg as needed, Xanax 0.5 mg daily at bedtime as needed, Actonel 150 mg monthly, Lexapro 10 mg daily, Darvocet 1 to 2 tabs q.4 hours as needed, ciprofloxacin 250 mg b.i.d. x4 days.   PERTINENT STUDIES DONE DURING THE HOSPITAL COURSE: As follows:  A CT scan of the head done without contrast showing no acute intracranial process. A chest x-ray done on admission showing bilateral lower lobe scarring or atelectasis.   HOSPITAL COURSE: This is a 64 year old female who presented to the hospital with suspected syncopal episode and a fall.    1.  Fall/syncope. The exact etiology of this is unclear, but suspected to be vasovagal or related to her deconditioning for morbid obesity.  This is unlikely a neurogenic syncope as she has a nonfocal neurological exam. Her CT scan of the head was negative. She was observed on telemetry, had no evidence of any acute  cardiac arrhythmias. She was seen by physical therapy initiated recommended short-term rehab, although after re-evaluating her she had significantly improved in 24 hours. She is currently being discharged with home health services.  Her orthostatic vital signs were also checked, which were negative too.  2.  Urinary tract infection. This was based on her urinalysis. The patient is being discharged on oral Cipro. Her urine cultures are essentially benign presently.  3.  Hypertension. The patient was maintained on losartan and verapamil and HCTZ. She will continue that. She was not noted to be orthostatic . 4.  Depression/anxiety. The patient was maintained on Lexapro and Xanax. She will resume that.  5.  Fungal skin infection. This was in her abdominal folds. She was given some nystatin powder and this has improved. 6.  Hypothyroidism. The patient was maintained on Synthroid. She will continue that.  7.  Hyperlipidemia. The patient was maintained on simvastatin and she will also resume that upon discharge.   CODE STATUS: The patient is a full code.   The patient is being discharged with home health services.   TIME SPENT: 35 minutes.    ____________________________ Belia Heman. Verdell Carmine, MD vjs:at D: 01/14/2014 16:15:51 ET T: 01/14/2014 16:39:11 ET JOB#: [id number]  cc: Belia Heman. Verdell Carmine, MD, <Dictator> Lavera Guise, MD Henreitta Leber MD ELECTRONICALLY SIGNED 01/22/2014 10:47

## 2014-05-20 NOTE — H&P (Signed)
PATIENT NAME:  Theresa Mills, Theresa Mills MR#:  [id number] DATE OF BIRTH:  1950-06-27  DATE OF ADMISSION:  01/13/2014  REFERRING PHYSICIAN:  Yetta Numbers. Karma Greaser, MD     PRIMARY CARE DOCTOR:  Lavera Guise, MD    ADMIT DIAGNOSIS:  Syncope.    HISTORY OF PRESENT ILLNESS:  This is a 64 year old Caucasian female who presents to the emergency department via EMS apparently after having an episode of syncope.  The patient does not remember any of the circumstances after she called 911 nor does she recall prodrome to losing consciousness.  It is clear at this time that she has a bruise on the right frontal aspect of her forehead but she has not had any nausea or vomiting or visual changes since being in the Emergency Department.  Overall the patient complains of weakness although she denies feeling acutely ill.  Due to inability to determine that she has a safe environment at home and potential concussive syndrome, the Emergency Department called for admission.    REVIEW OF SYSTEMS:   CONSTITUTIONAL: The patient denies fever but admits to generalized weakness.  EYES: Denies inflammation or blurred vision.  EARS, NOSE AND THROAT: Denies tinnitus or sore throat.  RESPIRATORY: Denies shortness of breath or cough.  CARDIOVASCULAR: Denies chest pain or palpitations.  GASTROINTESTINAL: Denies nausea, vomiting, diarrhea or abdominal pain.   GENITOURINARY: Denies dysuria, increased frequency or hesitancy of urination.  HEMATOLOGIC AND LYMPHATIC: Denies easy bruising or bleeding.   INTEGUMENTARY: Denies rashes or lesions.  MUSCULOSKELETAL: Denies myalgias or arthralgias but admits to restless legs.   ENDOCRINE: Denies polyuria or polydipsia.  NEUROLOGIC: Denies numbness in her extremities or dysarthria although notably the patient does have some difficulty finding words.   PSYCHIATRIC: Denies depression or suicidal ideation.    PAST MEDICAL HISTORY:  Hypertension, diabetes, history of endometrial cancer, osteopenia and  restless leg syndrome and obesity.    FAMILY HISTORY:   The patient admits to hypertension to her family.    SOCIAL HISTORY:   The patient lives alone.  She does not smoke, drink, or do any drugs.    MEDICATIONS:   1.  Actonel 150 mg 1 tablet p.o. once monthly.   2.  Alprazolam 0.5 mg 1 tablet p.o. at bedtime.   3.  Celebrex 200 mg 1 capsule p.o. b.i.d.   4.  Cephalexin 500 mg 1 tablet p.o. every 6 hours.   5.  Darvocet-N 100 mg tablet, take 1 to 2 tablets p.o. every 4 to 6 hours as needed for pain.   6.  Diovan HCT 80 mg - 12.5 mg 1 tablet p.o. daily.   7.  Fexofenadine 180 mg 1 tablet p.o. daily as needed.   8.  Janumet 50 mg - 500 mg 1 tablet p.o. b.i.d. with meals.   9.  Lexapro 10 mg 1 tablet p.o. daily.   10.  Omeprazole 40 mg delayed release capsule 1 capsule p.o. daily.   11.  Pramipexole 0.125 mg 1 tablet p.o. at bedtime.   12.  Synthroid 50 mcg 1 tablet p.o. once a day.   13.  Verapamil 180 mg extended release 1 tablet p.o. at bedtime.     ALLERGIES:  CODEINE, EPINEPHRINE, HYDROCODONE, AND VALIUM.    PERTINENT LABORATORY RESULTS AND RADIOGRAPHIC FINDINGS:  Serum glucose is 111, BUN is 20, creatinine 0.42, serum sodium is 140, potassium is 4, chloride 104, bicarb 28, magnesium 1.8, lipase 104, calcium is 8.9, serum albumin is 3.2, alkaline phosphatase is 45,  AST 33, ALT 24, troponin is negative, white blood cell count is 9.1, hemoglobin 15.6, hematocrit 48.3, platelet count is 171,000, INR is 1.1.  CT of the head without contrast shows generalized atrophy.  Chest x-ray shows bilateral lower lobe scarring or atelectasis.    PHYSICAL EXAMINATION:   VITAL SIGNS:   Temperature 98.9, pulse 73, respirations 18, blood pressure 125/55, pulse oximetry is 96% on room air.   GENERAL: The patient is alert and oriented, in no apparent distress but she looks very anxious.   HEENT: Normocephalic but the patient does have a bruise with petechia on the right frontal aspect of her forehead.   Extraocular movements are intact. Pupils equal, round, and reactive to light and accommodation. Mucous membranes are dry.  NECK: Trachea is midline. No adenopathy.  CHEST: Symmetric, atraumatic.  CARDIOVASCULAR: Regular rate and rhythm. Normal S1, S2. No rubs, clicks, or murmurs appreciated.  LUNGS: Clear to auscultation bilaterally. Normal effort and excursion.  ABDOMEN: Positive bowel sounds. Soft. Nontender.  Nondistended. No hepatosplenomegaly.  GENITOURINARY: Deferred.  MUSCULOSKELETAL: The patient moves all 4 extremities equally. There is 5/5 strength in upper and lower extremities bilaterally.  SKIN: No rashes or lesions.  EXTREMITIES: No clubbing or cyanosis. The patient does have tense edema of her lower extremities, right more than left that is painful to palpation.  NEUROLOGIC: Cranial nerves II through XII are grossly intact.  PSYCHIATRIC: The patient's mood is please but her affect is odd in that she takes an extraordinary amount of time to say very short sentences with simple syntax.  She also states that she is scared but she will not tell me of what or whom.    ASSESSMENT AND PLAN:  This is a 64 year old female admitted for syncope.   1.  Syncope:  The patient has no recollection of the prodrome or immediate aftermath of falling.  She states she feels confused but when challenged on details about her past medical history she is very forthcoming and I have verified some of those details in her past documentation.  Her head CT shows some general atrophy particularly in the right frontal lobe.  That may have something to do with her odd affect or if this is  a component of her personality, however, I am concerned that she may have some social stressors or home environment that is causing her a lot of anxiety.  At this time she does not have discernible etiology to syncope.  Her EKG is normal and she does not have a history of arrhythmias.  She may in fact be orthostatic at this time but it  is difficult to ambulate the patient given her weight and generalized weakness.  For these reasons, we will observe the patient overnight.  Also, she may have some concussive syndrome.   2.  Hypertension:  It is controlled at this time.  Continue Diovan as well as verapamil.   3.  Diabetes:  We will place the patient on sliding-scale insulin while hospitalized.  I will hold her oral hyperglycemics.   4.  Osteopenia:  The patient has no musculoskeletal pain at this time to warrant a skeletal survey and fortunately it seems the only trauma she has sustained is to her head and we will monitor her neurologic status overnight.  The patient will continue her bisphosphonate therapy per her monthly schedule.    5.  Restless leg syndrome, stable:  We will give the patient her pramipexole if she complains of excessive movement of her lower  extremities.   6.  Obesity:  The patient's body mass index is 50.7.  I have encouraged a diet and some form of exercise even if it is upper body calisthenics or range of motion and strengthening exercises.   7.  Deep venous thrombosis prophylaxis:  Sequential compression devices.   8.  Gastrointestinal prophylaxis:  None as the patient is not critically ill at this time.    CODE STATUS:  The patient is a full code.    TIME SPENT ON ADMISSION ORDERS AND PATIENT CARE:  Approximately 45 minutes.    ____________________________ Norva Riffle. Marcille Blanco, MD msd:AT D: 01/13/2014 28:20:60 ET T: 01/13/2014 03:19:24 ET JOB#: [id number]  cc: Norva Riffle. Marcille Blanco, MD, <Dictator> Norva Riffle Posie Lillibridge MD ELECTRONICALLY SIGNED 01/28/2014 0:29

## 2014-05-21 DIAGNOSIS — R0602 Shortness of breath: Secondary | ICD-10-CM | POA: Diagnosis not present

## 2014-05-29 DIAGNOSIS — Z01419 Encounter for gynecological examination (general) (routine) without abnormal findings: Secondary | ICD-10-CM | POA: Diagnosis not present

## 2014-05-29 DIAGNOSIS — Z1231 Encounter for screening mammogram for malignant neoplasm of breast: Secondary | ICD-10-CM | POA: Diagnosis not present

## 2014-05-29 DIAGNOSIS — K625 Hemorrhage of anus and rectum: Secondary | ICD-10-CM | POA: Diagnosis not present

## 2014-06-25 DIAGNOSIS — G20 Parkinson's disease: Secondary | ICD-10-CM | POA: Diagnosis not present

## 2014-06-25 DIAGNOSIS — R410 Disorientation, unspecified: Secondary | ICD-10-CM | POA: Diagnosis not present

## 2014-06-25 DIAGNOSIS — G3184 Mild cognitive impairment, so stated: Secondary | ICD-10-CM | POA: Diagnosis not present

## 2014-06-25 DIAGNOSIS — G479 Sleep disorder, unspecified: Secondary | ICD-10-CM | POA: Diagnosis not present

## 2014-06-25 DIAGNOSIS — R262 Difficulty in walking, not elsewhere classified: Secondary | ICD-10-CM | POA: Diagnosis not present

## 2014-06-25 DIAGNOSIS — G259 Extrapyramidal and movement disorder, unspecified: Secondary | ICD-10-CM | POA: Diagnosis not present

## 2014-06-25 DIAGNOSIS — E119 Type 2 diabetes mellitus without complications: Secondary | ICD-10-CM | POA: Diagnosis not present

## 2014-06-29 DIAGNOSIS — M159 Polyosteoarthritis, unspecified: Secondary | ICD-10-CM | POA: Diagnosis not present

## 2014-06-29 DIAGNOSIS — E668 Other obesity: Secondary | ICD-10-CM | POA: Diagnosis not present

## 2014-06-29 DIAGNOSIS — E063 Autoimmune thyroiditis: Secondary | ICD-10-CM | POA: Diagnosis not present

## 2014-06-29 DIAGNOSIS — E785 Hyperlipidemia, unspecified: Secondary | ICD-10-CM | POA: Diagnosis not present

## 2014-06-29 DIAGNOSIS — E118 Type 2 diabetes mellitus with unspecified complications: Secondary | ICD-10-CM | POA: Diagnosis not present

## 2014-06-29 DIAGNOSIS — E039 Hypothyroidism, unspecified: Secondary | ICD-10-CM | POA: Diagnosis not present

## 2014-07-01 DIAGNOSIS — G20 Parkinson's disease: Secondary | ICD-10-CM | POA: Diagnosis not present

## 2014-07-01 DIAGNOSIS — Z85850 Personal history of malignant neoplasm of thyroid: Secondary | ICD-10-CM | POA: Diagnosis not present

## 2014-07-01 DIAGNOSIS — M170 Bilateral primary osteoarthritis of knee: Secondary | ICD-10-CM | POA: Diagnosis not present

## 2014-07-01 DIAGNOSIS — E1151 Type 2 diabetes mellitus with diabetic peripheral angiopathy without gangrene: Secondary | ICD-10-CM | POA: Diagnosis not present

## 2014-07-01 DIAGNOSIS — I10 Essential (primary) hypertension: Secondary | ICD-10-CM | POA: Diagnosis not present

## 2014-07-01 DIAGNOSIS — E1165 Type 2 diabetes mellitus with hyperglycemia: Secondary | ICD-10-CM | POA: Diagnosis not present

## 2014-07-01 DIAGNOSIS — F330 Major depressive disorder, recurrent, mild: Secondary | ICD-10-CM | POA: Diagnosis not present

## 2014-07-01 DIAGNOSIS — Z9981 Dependence on supplemental oxygen: Secondary | ICD-10-CM | POA: Diagnosis not present

## 2014-07-06 ENCOUNTER — Ambulatory Visit (INDEPENDENT_AMBULATORY_CARE_PROVIDER_SITE_OTHER): Payer: Medicare Other | Admitting: Podiatry

## 2014-07-06 DIAGNOSIS — B351 Tinea unguium: Secondary | ICD-10-CM

## 2014-07-06 DIAGNOSIS — M79676 Pain in unspecified toe(s): Secondary | ICD-10-CM

## 2014-07-06 DIAGNOSIS — E1142 Type 2 diabetes mellitus with diabetic polyneuropathy: Secondary | ICD-10-CM

## 2014-07-06 NOTE — Progress Notes (Signed)
Patient presents to the office today with a chief complaint of painful elongated toenails.  Objective: Pulses are palpable bilateral. Nails are thick yellow dystrophic clinically mycotic and painful palpation.  Assessment: Pain in limb secondary to onychomycosis 1 through 5 bilateral.  Plan: Debridement of nails 1 through 5 bilateral covered service secondary to pain.          Mild hallux pinch callus B reduced.

## 2014-07-07 DIAGNOSIS — R404 Transient alteration of awareness: Secondary | ICD-10-CM | POA: Diagnosis not present

## 2014-07-07 DIAGNOSIS — G3184 Mild cognitive impairment, so stated: Secondary | ICD-10-CM | POA: Diagnosis not present

## 2014-07-08 DIAGNOSIS — M170 Bilateral primary osteoarthritis of knee: Secondary | ICD-10-CM | POA: Diagnosis not present

## 2014-07-08 DIAGNOSIS — I10 Essential (primary) hypertension: Secondary | ICD-10-CM | POA: Diagnosis not present

## 2014-07-08 DIAGNOSIS — G20 Parkinson's disease: Secondary | ICD-10-CM | POA: Diagnosis not present

## 2014-07-08 DIAGNOSIS — E1151 Type 2 diabetes mellitus with diabetic peripheral angiopathy without gangrene: Secondary | ICD-10-CM | POA: Diagnosis not present

## 2014-07-08 DIAGNOSIS — E1165 Type 2 diabetes mellitus with hyperglycemia: Secondary | ICD-10-CM | POA: Diagnosis not present

## 2014-07-08 DIAGNOSIS — F330 Major depressive disorder, recurrent, mild: Secondary | ICD-10-CM | POA: Diagnosis not present

## 2014-07-15 DIAGNOSIS — E1165 Type 2 diabetes mellitus with hyperglycemia: Secondary | ICD-10-CM | POA: Diagnosis not present

## 2014-07-15 DIAGNOSIS — I10 Essential (primary) hypertension: Secondary | ICD-10-CM | POA: Diagnosis not present

## 2014-07-15 DIAGNOSIS — F330 Major depressive disorder, recurrent, mild: Secondary | ICD-10-CM | POA: Diagnosis not present

## 2014-07-15 DIAGNOSIS — M170 Bilateral primary osteoarthritis of knee: Secondary | ICD-10-CM | POA: Diagnosis not present

## 2014-07-15 DIAGNOSIS — E1151 Type 2 diabetes mellitus with diabetic peripheral angiopathy without gangrene: Secondary | ICD-10-CM | POA: Diagnosis not present

## 2014-07-15 DIAGNOSIS — G20 Parkinson's disease: Secondary | ICD-10-CM | POA: Diagnosis not present

## 2014-07-21 DIAGNOSIS — F330 Major depressive disorder, recurrent, mild: Secondary | ICD-10-CM | POA: Diagnosis not present

## 2014-07-21 DIAGNOSIS — I10 Essential (primary) hypertension: Secondary | ICD-10-CM | POA: Diagnosis not present

## 2014-07-21 DIAGNOSIS — M170 Bilateral primary osteoarthritis of knee: Secondary | ICD-10-CM | POA: Diagnosis not present

## 2014-07-21 DIAGNOSIS — E1165 Type 2 diabetes mellitus with hyperglycemia: Secondary | ICD-10-CM | POA: Diagnosis not present

## 2014-07-21 DIAGNOSIS — E1151 Type 2 diabetes mellitus with diabetic peripheral angiopathy without gangrene: Secondary | ICD-10-CM | POA: Diagnosis not present

## 2014-07-21 DIAGNOSIS — G20 Parkinson's disease: Secondary | ICD-10-CM | POA: Diagnosis not present

## 2014-07-29 DIAGNOSIS — F330 Major depressive disorder, recurrent, mild: Secondary | ICD-10-CM | POA: Diagnosis not present

## 2014-07-29 DIAGNOSIS — E1151 Type 2 diabetes mellitus with diabetic peripheral angiopathy without gangrene: Secondary | ICD-10-CM | POA: Diagnosis not present

## 2014-07-29 DIAGNOSIS — I10 Essential (primary) hypertension: Secondary | ICD-10-CM | POA: Diagnosis not present

## 2014-07-29 DIAGNOSIS — M170 Bilateral primary osteoarthritis of knee: Secondary | ICD-10-CM | POA: Diagnosis not present

## 2014-07-29 DIAGNOSIS — G20 Parkinson's disease: Secondary | ICD-10-CM | POA: Diagnosis not present

## 2014-07-29 DIAGNOSIS — E1165 Type 2 diabetes mellitus with hyperglycemia: Secondary | ICD-10-CM | POA: Diagnosis not present

## 2014-08-04 DIAGNOSIS — R262 Difficulty in walking, not elsewhere classified: Secondary | ICD-10-CM | POA: Diagnosis not present

## 2014-08-04 DIAGNOSIS — E1165 Type 2 diabetes mellitus with hyperglycemia: Secondary | ICD-10-CM | POA: Diagnosis not present

## 2014-08-04 DIAGNOSIS — G479 Sleep disorder, unspecified: Secondary | ICD-10-CM | POA: Diagnosis not present

## 2014-08-04 DIAGNOSIS — G20 Parkinson's disease: Secondary | ICD-10-CM | POA: Diagnosis not present

## 2014-08-05 DIAGNOSIS — E1151 Type 2 diabetes mellitus with diabetic peripheral angiopathy without gangrene: Secondary | ICD-10-CM | POA: Diagnosis not present

## 2014-08-05 DIAGNOSIS — G20 Parkinson's disease: Secondary | ICD-10-CM | POA: Diagnosis not present

## 2014-08-05 DIAGNOSIS — E1165 Type 2 diabetes mellitus with hyperglycemia: Secondary | ICD-10-CM | POA: Diagnosis not present

## 2014-08-05 DIAGNOSIS — M170 Bilateral primary osteoarthritis of knee: Secondary | ICD-10-CM | POA: Diagnosis not present

## 2014-08-05 DIAGNOSIS — I10 Essential (primary) hypertension: Secondary | ICD-10-CM | POA: Diagnosis not present

## 2014-08-05 DIAGNOSIS — F330 Major depressive disorder, recurrent, mild: Secondary | ICD-10-CM | POA: Diagnosis not present

## 2014-08-12 DIAGNOSIS — E1165 Type 2 diabetes mellitus with hyperglycemia: Secondary | ICD-10-CM | POA: Diagnosis not present

## 2014-08-12 DIAGNOSIS — E1151 Type 2 diabetes mellitus with diabetic peripheral angiopathy without gangrene: Secondary | ICD-10-CM | POA: Diagnosis not present

## 2014-08-12 DIAGNOSIS — G20 Parkinson's disease: Secondary | ICD-10-CM | POA: Diagnosis not present

## 2014-08-12 DIAGNOSIS — M170 Bilateral primary osteoarthritis of knee: Secondary | ICD-10-CM | POA: Diagnosis not present

## 2014-08-19 DIAGNOSIS — F330 Major depressive disorder, recurrent, mild: Secondary | ICD-10-CM | POA: Diagnosis not present

## 2014-08-19 DIAGNOSIS — E1151 Type 2 diabetes mellitus with diabetic peripheral angiopathy without gangrene: Secondary | ICD-10-CM | POA: Diagnosis not present

## 2014-08-19 DIAGNOSIS — M170 Bilateral primary osteoarthritis of knee: Secondary | ICD-10-CM | POA: Diagnosis not present

## 2014-08-19 DIAGNOSIS — E1165 Type 2 diabetes mellitus with hyperglycemia: Secondary | ICD-10-CM | POA: Diagnosis not present

## 2014-08-19 DIAGNOSIS — G20 Parkinson's disease: Secondary | ICD-10-CM | POA: Diagnosis not present

## 2014-08-19 DIAGNOSIS — I10 Essential (primary) hypertension: Secondary | ICD-10-CM | POA: Diagnosis not present

## 2014-08-31 ENCOUNTER — Ambulatory Visit (INDEPENDENT_AMBULATORY_CARE_PROVIDER_SITE_OTHER): Payer: Medicare Other | Admitting: Podiatry

## 2014-08-31 DIAGNOSIS — B351 Tinea unguium: Secondary | ICD-10-CM

## 2014-08-31 DIAGNOSIS — M79676 Pain in unspecified toe(s): Secondary | ICD-10-CM | POA: Diagnosis not present

## 2014-08-31 NOTE — Progress Notes (Signed)
Patient presents to the office today with a chief complaint of painful elongated toenails.  Objective: Pulses are palpable bilateral. Nails are thick yellow dystrophic clinically mycotic and painful palpation.  Assessment: Pain in limb secondary to onychomycosis 1 through 5 bilateral.  Plan: Debridement of nails 1 through 5 bilateral covered service secondary to pain.          Mild hallux pinch callus B reduced, greater R.  3 mo.Marland Kitchen

## 2014-09-03 DIAGNOSIS — D519 Vitamin B12 deficiency anemia, unspecified: Secondary | ICD-10-CM | POA: Diagnosis not present

## 2014-09-03 DIAGNOSIS — G20 Parkinson's disease: Secondary | ICD-10-CM | POA: Diagnosis not present

## 2014-09-03 DIAGNOSIS — R262 Difficulty in walking, not elsewhere classified: Secondary | ICD-10-CM | POA: Diagnosis not present

## 2014-09-03 DIAGNOSIS — E1151 Type 2 diabetes mellitus with diabetic peripheral angiopathy without gangrene: Secondary | ICD-10-CM | POA: Diagnosis not present

## 2014-09-08 DIAGNOSIS — I10 Essential (primary) hypertension: Secondary | ICD-10-CM | POA: Diagnosis not present

## 2014-09-08 DIAGNOSIS — F411 Generalized anxiety disorder: Secondary | ICD-10-CM | POA: Diagnosis not present

## 2014-09-08 DIAGNOSIS — Z9981 Dependence on supplemental oxygen: Secondary | ICD-10-CM | POA: Diagnosis not present

## 2014-09-08 DIAGNOSIS — G20 Parkinson's disease: Secondary | ICD-10-CM | POA: Diagnosis not present

## 2014-09-08 DIAGNOSIS — M1990 Unspecified osteoarthritis, unspecified site: Secondary | ICD-10-CM | POA: Diagnosis not present

## 2014-09-08 DIAGNOSIS — Z85850 Personal history of malignant neoplasm of thyroid: Secondary | ICD-10-CM | POA: Diagnosis not present

## 2014-09-08 DIAGNOSIS — F330 Major depressive disorder, recurrent, mild: Secondary | ICD-10-CM | POA: Diagnosis not present

## 2014-09-08 DIAGNOSIS — E1151 Type 2 diabetes mellitus with diabetic peripheral angiopathy without gangrene: Secondary | ICD-10-CM | POA: Diagnosis not present

## 2014-09-08 DIAGNOSIS — Z8542 Personal history of malignant neoplasm of other parts of uterus: Secondary | ICD-10-CM | POA: Diagnosis not present

## 2014-09-10 DIAGNOSIS — I10 Essential (primary) hypertension: Secondary | ICD-10-CM | POA: Diagnosis not present

## 2014-09-10 DIAGNOSIS — E1151 Type 2 diabetes mellitus with diabetic peripheral angiopathy without gangrene: Secondary | ICD-10-CM | POA: Diagnosis not present

## 2014-09-10 DIAGNOSIS — M1990 Unspecified osteoarthritis, unspecified site: Secondary | ICD-10-CM | POA: Diagnosis not present

## 2014-09-10 DIAGNOSIS — F330 Major depressive disorder, recurrent, mild: Secondary | ICD-10-CM | POA: Diagnosis not present

## 2014-09-10 DIAGNOSIS — G20 Parkinson's disease: Secondary | ICD-10-CM | POA: Diagnosis not present

## 2014-09-10 DIAGNOSIS — F411 Generalized anxiety disorder: Secondary | ICD-10-CM | POA: Diagnosis not present

## 2014-09-15 DIAGNOSIS — E1151 Type 2 diabetes mellitus with diabetic peripheral angiopathy without gangrene: Secondary | ICD-10-CM | POA: Diagnosis not present

## 2014-09-15 DIAGNOSIS — F411 Generalized anxiety disorder: Secondary | ICD-10-CM | POA: Diagnosis not present

## 2014-09-15 DIAGNOSIS — I10 Essential (primary) hypertension: Secondary | ICD-10-CM | POA: Diagnosis not present

## 2014-09-15 DIAGNOSIS — F330 Major depressive disorder, recurrent, mild: Secondary | ICD-10-CM | POA: Diagnosis not present

## 2014-09-15 DIAGNOSIS — M1990 Unspecified osteoarthritis, unspecified site: Secondary | ICD-10-CM | POA: Diagnosis not present

## 2014-09-15 DIAGNOSIS — G20 Parkinson's disease: Secondary | ICD-10-CM | POA: Diagnosis not present

## 2014-09-17 DIAGNOSIS — N390 Urinary tract infection, site not specified: Secondary | ICD-10-CM | POA: Diagnosis not present

## 2014-09-17 DIAGNOSIS — Z0000 Encounter for general adult medical examination without abnormal findings: Secondary | ICD-10-CM | POA: Diagnosis not present

## 2014-09-23 DIAGNOSIS — G20 Parkinson's disease: Secondary | ICD-10-CM | POA: Diagnosis not present

## 2014-09-23 DIAGNOSIS — F330 Major depressive disorder, recurrent, mild: Secondary | ICD-10-CM | POA: Diagnosis not present

## 2014-09-23 DIAGNOSIS — M1990 Unspecified osteoarthritis, unspecified site: Secondary | ICD-10-CM | POA: Diagnosis not present

## 2014-09-23 DIAGNOSIS — F411 Generalized anxiety disorder: Secondary | ICD-10-CM | POA: Diagnosis not present

## 2014-09-23 DIAGNOSIS — I10 Essential (primary) hypertension: Secondary | ICD-10-CM | POA: Diagnosis not present

## 2014-09-23 DIAGNOSIS — E1151 Type 2 diabetes mellitus with diabetic peripheral angiopathy without gangrene: Secondary | ICD-10-CM | POA: Diagnosis not present

## 2014-09-25 DIAGNOSIS — E1151 Type 2 diabetes mellitus with diabetic peripheral angiopathy without gangrene: Secondary | ICD-10-CM | POA: Diagnosis not present

## 2014-09-25 DIAGNOSIS — F411 Generalized anxiety disorder: Secondary | ICD-10-CM | POA: Diagnosis not present

## 2014-09-25 DIAGNOSIS — M1990 Unspecified osteoarthritis, unspecified site: Secondary | ICD-10-CM | POA: Diagnosis not present

## 2014-09-25 DIAGNOSIS — F330 Major depressive disorder, recurrent, mild: Secondary | ICD-10-CM | POA: Diagnosis not present

## 2014-09-25 DIAGNOSIS — I10 Essential (primary) hypertension: Secondary | ICD-10-CM | POA: Diagnosis not present

## 2014-09-25 DIAGNOSIS — G20 Parkinson's disease: Secondary | ICD-10-CM | POA: Diagnosis not present

## 2014-09-30 DIAGNOSIS — M1990 Unspecified osteoarthritis, unspecified site: Secondary | ICD-10-CM | POA: Diagnosis not present

## 2014-09-30 DIAGNOSIS — F330 Major depressive disorder, recurrent, mild: Secondary | ICD-10-CM | POA: Diagnosis not present

## 2014-09-30 DIAGNOSIS — E1151 Type 2 diabetes mellitus with diabetic peripheral angiopathy without gangrene: Secondary | ICD-10-CM | POA: Diagnosis not present

## 2014-09-30 DIAGNOSIS — F411 Generalized anxiety disorder: Secondary | ICD-10-CM | POA: Diagnosis not present

## 2014-09-30 DIAGNOSIS — I10 Essential (primary) hypertension: Secondary | ICD-10-CM | POA: Diagnosis not present

## 2014-09-30 DIAGNOSIS — G20 Parkinson's disease: Secondary | ICD-10-CM | POA: Diagnosis not present

## 2014-10-02 DIAGNOSIS — E1151 Type 2 diabetes mellitus with diabetic peripheral angiopathy without gangrene: Secondary | ICD-10-CM | POA: Diagnosis not present

## 2014-10-02 DIAGNOSIS — F411 Generalized anxiety disorder: Secondary | ICD-10-CM | POA: Diagnosis not present

## 2014-10-02 DIAGNOSIS — I10 Essential (primary) hypertension: Secondary | ICD-10-CM | POA: Diagnosis not present

## 2014-10-02 DIAGNOSIS — E785 Hyperlipidemia, unspecified: Secondary | ICD-10-CM | POA: Diagnosis not present

## 2014-10-02 DIAGNOSIS — E118 Type 2 diabetes mellitus with unspecified complications: Secondary | ICD-10-CM | POA: Diagnosis not present

## 2014-10-02 DIAGNOSIS — G20 Parkinson's disease: Secondary | ICD-10-CM | POA: Diagnosis not present

## 2014-10-02 DIAGNOSIS — M1990 Unspecified osteoarthritis, unspecified site: Secondary | ICD-10-CM | POA: Diagnosis not present

## 2014-10-02 DIAGNOSIS — F330 Major depressive disorder, recurrent, mild: Secondary | ICD-10-CM | POA: Diagnosis not present

## 2014-10-06 DIAGNOSIS — I10 Essential (primary) hypertension: Secondary | ICD-10-CM | POA: Diagnosis not present

## 2014-10-06 DIAGNOSIS — E1151 Type 2 diabetes mellitus with diabetic peripheral angiopathy without gangrene: Secondary | ICD-10-CM | POA: Diagnosis not present

## 2014-10-06 DIAGNOSIS — F411 Generalized anxiety disorder: Secondary | ICD-10-CM | POA: Diagnosis not present

## 2014-10-06 DIAGNOSIS — F330 Major depressive disorder, recurrent, mild: Secondary | ICD-10-CM | POA: Diagnosis not present

## 2014-10-06 DIAGNOSIS — M1990 Unspecified osteoarthritis, unspecified site: Secondary | ICD-10-CM | POA: Diagnosis not present

## 2014-10-06 DIAGNOSIS — G20 Parkinson's disease: Secondary | ICD-10-CM | POA: Diagnosis not present

## 2014-10-07 DIAGNOSIS — E039 Hypothyroidism, unspecified: Secondary | ICD-10-CM | POA: Diagnosis not present

## 2014-10-07 DIAGNOSIS — C73 Malignant neoplasm of thyroid gland: Secondary | ICD-10-CM | POA: Diagnosis not present

## 2014-10-07 DIAGNOSIS — Z23 Encounter for immunization: Secondary | ICD-10-CM | POA: Diagnosis not present

## 2014-10-07 DIAGNOSIS — E063 Autoimmune thyroiditis: Secondary | ICD-10-CM | POA: Diagnosis not present

## 2014-10-07 DIAGNOSIS — E118 Type 2 diabetes mellitus with unspecified complications: Secondary | ICD-10-CM | POA: Diagnosis not present

## 2014-10-07 DIAGNOSIS — M159 Polyosteoarthritis, unspecified: Secondary | ICD-10-CM | POA: Diagnosis not present

## 2014-10-07 DIAGNOSIS — E668 Other obesity: Secondary | ICD-10-CM | POA: Diagnosis not present

## 2014-10-07 DIAGNOSIS — E785 Hyperlipidemia, unspecified: Secondary | ICD-10-CM | POA: Diagnosis not present

## 2014-10-12 ENCOUNTER — Ambulatory Visit (INDEPENDENT_AMBULATORY_CARE_PROVIDER_SITE_OTHER): Payer: Medicare Other | Admitting: Podiatry

## 2014-10-12 DIAGNOSIS — B351 Tinea unguium: Secondary | ICD-10-CM

## 2014-10-12 DIAGNOSIS — M79676 Pain in unspecified toe(s): Secondary | ICD-10-CM | POA: Diagnosis not present

## 2014-10-12 DIAGNOSIS — E1142 Type 2 diabetes mellitus with diabetic polyneuropathy: Secondary | ICD-10-CM

## 2014-10-12 NOTE — Progress Notes (Signed)
Patient presents to the office today with a chief complaint of painful elongated toenails.  Objective: Pulses are palpable bilateral. Nails are thick yellow dystrophic clinically mycotic and painful palpation.  Assessment: Pain in limb secondary to onychomycosis 1 through 5 bilateral.  Plan: Debridement of nails 1 through 5 bilateral covered service secondary to pain.          Mild hallux pinch callus B reduced, greater R.  Mild distal corn 3 R.  3 mo.

## 2014-10-13 DIAGNOSIS — G20 Parkinson's disease: Secondary | ICD-10-CM | POA: Diagnosis not present

## 2014-10-13 DIAGNOSIS — F330 Major depressive disorder, recurrent, mild: Secondary | ICD-10-CM | POA: Diagnosis not present

## 2014-10-13 DIAGNOSIS — M1990 Unspecified osteoarthritis, unspecified site: Secondary | ICD-10-CM | POA: Diagnosis not present

## 2014-10-13 DIAGNOSIS — I10 Essential (primary) hypertension: Secondary | ICD-10-CM | POA: Diagnosis not present

## 2014-10-13 DIAGNOSIS — F411 Generalized anxiety disorder: Secondary | ICD-10-CM | POA: Diagnosis not present

## 2014-10-13 DIAGNOSIS — E1151 Type 2 diabetes mellitus with diabetic peripheral angiopathy without gangrene: Secondary | ICD-10-CM | POA: Diagnosis not present

## 2014-10-15 DIAGNOSIS — E1151 Type 2 diabetes mellitus with diabetic peripheral angiopathy without gangrene: Secondary | ICD-10-CM | POA: Diagnosis not present

## 2014-10-15 DIAGNOSIS — F411 Generalized anxiety disorder: Secondary | ICD-10-CM | POA: Diagnosis not present

## 2014-10-15 DIAGNOSIS — G20 Parkinson's disease: Secondary | ICD-10-CM | POA: Diagnosis not present

## 2014-10-15 DIAGNOSIS — F330 Major depressive disorder, recurrent, mild: Secondary | ICD-10-CM | POA: Diagnosis not present

## 2014-10-15 DIAGNOSIS — I10 Essential (primary) hypertension: Secondary | ICD-10-CM | POA: Diagnosis not present

## 2014-10-15 DIAGNOSIS — M1990 Unspecified osteoarthritis, unspecified site: Secondary | ICD-10-CM | POA: Diagnosis not present

## 2014-10-19 ENCOUNTER — Ambulatory Visit (INDEPENDENT_AMBULATORY_CARE_PROVIDER_SITE_OTHER): Payer: Medicare Other

## 2014-10-19 ENCOUNTER — Encounter: Payer: Self-pay | Admitting: Podiatry

## 2014-10-19 ENCOUNTER — Ambulatory Visit (INDEPENDENT_AMBULATORY_CARE_PROVIDER_SITE_OTHER): Payer: Medicare Other | Admitting: Podiatry

## 2014-10-19 VITALS — BP 129/76 | HR 78 | Resp 16

## 2014-10-19 DIAGNOSIS — S92912A Unspecified fracture of left toe(s), initial encounter for closed fracture: Secondary | ICD-10-CM

## 2014-10-19 NOTE — Progress Notes (Signed)
She presents today for chief complaint of fourth painful toe which she had on the bedpost earlier last week. She states that I know I broke my toe but I would like to know how bad it is.. She denies any other traumas..  Objective: Vital signs are stable alert and oriented 3. Pulses are strongly palpable. She has ecchymosis with mild lateral deviation of the fourth digit at the level of the mid diaphyseal proximal phalanx. Pulses remain palpable. 3 views radiographs taken in the office today demonstrates an attenuated diaphysis of the fourth digit left foot with a transverse fracture and mild displacement laterally. The bone is in contact with the other fragment and there is no comminution.  Assessment: Fracture proximal phalanx fourth digit left foot no comminution or fragmentation.  Plan: Placed her in a Darco shoe instructed her to stay in the shoe and will follow-up with her in 4-6 weeks.

## 2014-10-23 DIAGNOSIS — F330 Major depressive disorder, recurrent, mild: Secondary | ICD-10-CM | POA: Diagnosis not present

## 2014-10-23 DIAGNOSIS — F411 Generalized anxiety disorder: Secondary | ICD-10-CM | POA: Diagnosis not present

## 2014-10-23 DIAGNOSIS — E1151 Type 2 diabetes mellitus with diabetic peripheral angiopathy without gangrene: Secondary | ICD-10-CM | POA: Diagnosis not present

## 2014-10-23 DIAGNOSIS — I10 Essential (primary) hypertension: Secondary | ICD-10-CM | POA: Diagnosis not present

## 2014-10-23 DIAGNOSIS — M1990 Unspecified osteoarthritis, unspecified site: Secondary | ICD-10-CM | POA: Diagnosis not present

## 2014-10-23 DIAGNOSIS — G20 Parkinson's disease: Secondary | ICD-10-CM | POA: Diagnosis not present

## 2014-11-04 DIAGNOSIS — G20 Parkinson's disease: Secondary | ICD-10-CM | POA: Diagnosis not present

## 2014-11-04 DIAGNOSIS — I10 Essential (primary) hypertension: Secondary | ICD-10-CM | POA: Diagnosis not present

## 2014-11-04 DIAGNOSIS — M1990 Unspecified osteoarthritis, unspecified site: Secondary | ICD-10-CM | POA: Diagnosis not present

## 2014-11-04 DIAGNOSIS — E1151 Type 2 diabetes mellitus with diabetic peripheral angiopathy without gangrene: Secondary | ICD-10-CM | POA: Diagnosis not present

## 2014-11-04 DIAGNOSIS — F411 Generalized anxiety disorder: Secondary | ICD-10-CM | POA: Diagnosis not present

## 2014-11-04 DIAGNOSIS — F330 Major depressive disorder, recurrent, mild: Secondary | ICD-10-CM | POA: Diagnosis not present

## 2014-11-23 ENCOUNTER — Encounter: Payer: Self-pay | Admitting: Podiatry

## 2014-11-23 ENCOUNTER — Ambulatory Visit (INDEPENDENT_AMBULATORY_CARE_PROVIDER_SITE_OTHER): Payer: Medicare Other | Admitting: Podiatry

## 2014-11-23 ENCOUNTER — Ambulatory Visit (INDEPENDENT_AMBULATORY_CARE_PROVIDER_SITE_OTHER): Payer: Medicare Other

## 2014-11-23 VITALS — BP 120/69 | HR 68 | Resp 16

## 2014-11-23 DIAGNOSIS — S92912D Unspecified fracture of left toe(s), subsequent encounter for fracture with routine healing: Secondary | ICD-10-CM

## 2014-11-23 DIAGNOSIS — E1142 Type 2 diabetes mellitus with diabetic polyneuropathy: Secondary | ICD-10-CM | POA: Diagnosis not present

## 2014-11-23 DIAGNOSIS — B351 Tinea unguium: Secondary | ICD-10-CM

## 2014-11-23 DIAGNOSIS — M79674 Pain in right toe(s): Secondary | ICD-10-CM

## 2014-11-23 DIAGNOSIS — M79675 Pain in left toe(s): Secondary | ICD-10-CM

## 2014-11-23 NOTE — Progress Notes (Signed)
She presents today for follow-up of her fractured proximal phalanx fourth digit of the left foot. She states this seems to be doing some better and she continues to wear her Darco shoe. She also like to have her toenails cut setting and they're long and painful with ambulation.  Objective: Vital signs are stable she is alert and oriented 3. Pulses remain palpable bilateral. She has much decrease in symptomatology such as pain on palpation fourth toe left foot. Much decrease in edema no erythema saline as drainage or ecchymosis. Radiograph confirms soft tissue callus forming around the bone and healing. I would estimate probably 50-75% healed. Her toenails are thick yellow dystrophic clinic for mycotic bilateral foot. Areas of reactive hyperkeratosis are also present bilateral.  Assessment: Fracture fourth toe left foot healing well. Pain in limb secondary to onychomycosis bilateral.  Plan: Debrided all reactive hyperkeratosis bilateral. Debrided all of her nails 1 through 5 bilateral. Encouraged her to continue to wear the Darco shoe for at least another 3 weeks. I will follow-up with her at her regular scheduled appointment.  Roselind Messier DPM

## 2014-11-30 ENCOUNTER — Ambulatory Visit: Payer: Medicare Other

## 2014-12-04 DIAGNOSIS — E039 Hypothyroidism, unspecified: Secondary | ICD-10-CM | POA: Diagnosis not present

## 2014-12-04 DIAGNOSIS — F330 Major depressive disorder, recurrent, mild: Secondary | ICD-10-CM | POA: Diagnosis not present

## 2014-12-04 DIAGNOSIS — G20 Parkinson's disease: Secondary | ICD-10-CM | POA: Diagnosis not present

## 2014-12-04 DIAGNOSIS — E782 Mixed hyperlipidemia: Secondary | ICD-10-CM | POA: Diagnosis not present

## 2014-12-04 DIAGNOSIS — E1151 Type 2 diabetes mellitus with diabetic peripheral angiopathy without gangrene: Secondary | ICD-10-CM | POA: Diagnosis not present

## 2014-12-08 DIAGNOSIS — Z23 Encounter for immunization: Secondary | ICD-10-CM | POA: Diagnosis not present

## 2014-12-11 DIAGNOSIS — Z7984 Long term (current) use of oral hypoglycemic drugs: Secondary | ICD-10-CM | POA: Diagnosis not present

## 2014-12-11 DIAGNOSIS — M170 Bilateral primary osteoarthritis of knee: Secondary | ICD-10-CM | POA: Diagnosis not present

## 2014-12-11 DIAGNOSIS — Z8542 Personal history of malignant neoplasm of other parts of uterus: Secondary | ICD-10-CM | POA: Diagnosis not present

## 2014-12-11 DIAGNOSIS — F419 Anxiety disorder, unspecified: Secondary | ICD-10-CM | POA: Diagnosis not present

## 2014-12-11 DIAGNOSIS — G20 Parkinson's disease: Secondary | ICD-10-CM | POA: Diagnosis not present

## 2014-12-11 DIAGNOSIS — Z87440 Personal history of urinary (tract) infections: Secondary | ICD-10-CM | POA: Diagnosis not present

## 2014-12-11 DIAGNOSIS — E1151 Type 2 diabetes mellitus with diabetic peripheral angiopathy without gangrene: Secondary | ICD-10-CM | POA: Diagnosis not present

## 2014-12-11 DIAGNOSIS — Z9981 Dependence on supplemental oxygen: Secondary | ICD-10-CM | POA: Diagnosis not present

## 2014-12-11 DIAGNOSIS — I10 Essential (primary) hypertension: Secondary | ICD-10-CM | POA: Diagnosis not present

## 2014-12-11 DIAGNOSIS — Z85850 Personal history of malignant neoplasm of thyroid: Secondary | ICD-10-CM | POA: Diagnosis not present

## 2014-12-11 DIAGNOSIS — E669 Obesity, unspecified: Secondary | ICD-10-CM | POA: Diagnosis not present

## 2014-12-11 DIAGNOSIS — R309 Painful micturition, unspecified: Secondary | ICD-10-CM | POA: Diagnosis not present

## 2014-12-11 DIAGNOSIS — F329 Major depressive disorder, single episode, unspecified: Secondary | ICD-10-CM | POA: Diagnosis not present

## 2014-12-13 DIAGNOSIS — N399 Disorder of urinary system, unspecified: Secondary | ICD-10-CM | POA: Diagnosis not present

## 2014-12-13 DIAGNOSIS — N390 Urinary tract infection, site not specified: Secondary | ICD-10-CM | POA: Diagnosis not present

## 2014-12-15 DIAGNOSIS — R309 Painful micturition, unspecified: Secondary | ICD-10-CM | POA: Diagnosis not present

## 2014-12-15 DIAGNOSIS — F329 Major depressive disorder, single episode, unspecified: Secondary | ICD-10-CM | POA: Diagnosis not present

## 2014-12-15 DIAGNOSIS — G20 Parkinson's disease: Secondary | ICD-10-CM | POA: Diagnosis not present

## 2014-12-15 DIAGNOSIS — M170 Bilateral primary osteoarthritis of knee: Secondary | ICD-10-CM | POA: Diagnosis not present

## 2014-12-15 DIAGNOSIS — I10 Essential (primary) hypertension: Secondary | ICD-10-CM | POA: Diagnosis not present

## 2014-12-15 DIAGNOSIS — E1151 Type 2 diabetes mellitus with diabetic peripheral angiopathy without gangrene: Secondary | ICD-10-CM | POA: Diagnosis not present

## 2014-12-21 DIAGNOSIS — F329 Major depressive disorder, single episode, unspecified: Secondary | ICD-10-CM | POA: Diagnosis not present

## 2014-12-21 DIAGNOSIS — E1151 Type 2 diabetes mellitus with diabetic peripheral angiopathy without gangrene: Secondary | ICD-10-CM | POA: Diagnosis not present

## 2014-12-21 DIAGNOSIS — M170 Bilateral primary osteoarthritis of knee: Secondary | ICD-10-CM | POA: Diagnosis not present

## 2014-12-21 DIAGNOSIS — G20 Parkinson's disease: Secondary | ICD-10-CM | POA: Diagnosis not present

## 2014-12-21 DIAGNOSIS — R309 Painful micturition, unspecified: Secondary | ICD-10-CM | POA: Diagnosis not present

## 2014-12-21 DIAGNOSIS — I10 Essential (primary) hypertension: Secondary | ICD-10-CM | POA: Diagnosis not present

## 2014-12-25 DIAGNOSIS — R309 Painful micturition, unspecified: Secondary | ICD-10-CM | POA: Diagnosis not present

## 2014-12-25 DIAGNOSIS — G20 Parkinson's disease: Secondary | ICD-10-CM | POA: Diagnosis not present

## 2014-12-25 DIAGNOSIS — M170 Bilateral primary osteoarthritis of knee: Secondary | ICD-10-CM | POA: Diagnosis not present

## 2014-12-25 DIAGNOSIS — I10 Essential (primary) hypertension: Secondary | ICD-10-CM | POA: Diagnosis not present

## 2014-12-25 DIAGNOSIS — E1151 Type 2 diabetes mellitus with diabetic peripheral angiopathy without gangrene: Secondary | ICD-10-CM | POA: Diagnosis not present

## 2014-12-25 DIAGNOSIS — F329 Major depressive disorder, single episode, unspecified: Secondary | ICD-10-CM | POA: Diagnosis not present

## 2014-12-30 DIAGNOSIS — R309 Painful micturition, unspecified: Secondary | ICD-10-CM | POA: Diagnosis not present

## 2014-12-30 DIAGNOSIS — G20 Parkinson's disease: Secondary | ICD-10-CM | POA: Diagnosis not present

## 2014-12-30 DIAGNOSIS — I10 Essential (primary) hypertension: Secondary | ICD-10-CM | POA: Diagnosis not present

## 2014-12-30 DIAGNOSIS — M170 Bilateral primary osteoarthritis of knee: Secondary | ICD-10-CM | POA: Diagnosis not present

## 2014-12-30 DIAGNOSIS — R3915 Urgency of urination: Secondary | ICD-10-CM | POA: Diagnosis not present

## 2014-12-30 DIAGNOSIS — F329 Major depressive disorder, single episode, unspecified: Secondary | ICD-10-CM | POA: Diagnosis not present

## 2014-12-30 DIAGNOSIS — E1151 Type 2 diabetes mellitus with diabetic peripheral angiopathy without gangrene: Secondary | ICD-10-CM | POA: Diagnosis not present

## 2015-01-01 DIAGNOSIS — M170 Bilateral primary osteoarthritis of knee: Secondary | ICD-10-CM | POA: Diagnosis not present

## 2015-01-01 DIAGNOSIS — F329 Major depressive disorder, single episode, unspecified: Secondary | ICD-10-CM | POA: Diagnosis not present

## 2015-01-01 DIAGNOSIS — R309 Painful micturition, unspecified: Secondary | ICD-10-CM | POA: Diagnosis not present

## 2015-01-01 DIAGNOSIS — I10 Essential (primary) hypertension: Secondary | ICD-10-CM | POA: Diagnosis not present

## 2015-01-01 DIAGNOSIS — G20 Parkinson's disease: Secondary | ICD-10-CM | POA: Diagnosis not present

## 2015-01-01 DIAGNOSIS — E1151 Type 2 diabetes mellitus with diabetic peripheral angiopathy without gangrene: Secondary | ICD-10-CM | POA: Diagnosis not present

## 2015-01-04 DIAGNOSIS — G250 Essential tremor: Secondary | ICD-10-CM | POA: Diagnosis not present

## 2015-01-04 DIAGNOSIS — G20 Parkinson's disease: Secondary | ICD-10-CM | POA: Diagnosis not present

## 2015-01-05 DIAGNOSIS — E668 Other obesity: Secondary | ICD-10-CM | POA: Diagnosis not present

## 2015-01-05 DIAGNOSIS — C73 Malignant neoplasm of thyroid gland: Secondary | ICD-10-CM | POA: Diagnosis not present

## 2015-01-05 DIAGNOSIS — E118 Type 2 diabetes mellitus with unspecified complications: Secondary | ICD-10-CM | POA: Diagnosis not present

## 2015-01-05 DIAGNOSIS — E063 Autoimmune thyroiditis: Secondary | ICD-10-CM | POA: Diagnosis not present

## 2015-01-05 DIAGNOSIS — M159 Polyosteoarthritis, unspecified: Secondary | ICD-10-CM | POA: Diagnosis not present

## 2015-01-05 DIAGNOSIS — E785 Hyperlipidemia, unspecified: Secondary | ICD-10-CM | POA: Diagnosis not present

## 2015-01-05 DIAGNOSIS — E039 Hypothyroidism, unspecified: Secondary | ICD-10-CM | POA: Diagnosis not present

## 2015-01-08 DIAGNOSIS — R309 Painful micturition, unspecified: Secondary | ICD-10-CM | POA: Diagnosis not present

## 2015-01-08 DIAGNOSIS — G20 Parkinson's disease: Secondary | ICD-10-CM | POA: Diagnosis not present

## 2015-01-08 DIAGNOSIS — E1151 Type 2 diabetes mellitus with diabetic peripheral angiopathy without gangrene: Secondary | ICD-10-CM | POA: Diagnosis not present

## 2015-01-08 DIAGNOSIS — F329 Major depressive disorder, single episode, unspecified: Secondary | ICD-10-CM | POA: Diagnosis not present

## 2015-01-08 DIAGNOSIS — I10 Essential (primary) hypertension: Secondary | ICD-10-CM | POA: Diagnosis not present

## 2015-01-08 DIAGNOSIS — M170 Bilateral primary osteoarthritis of knee: Secondary | ICD-10-CM | POA: Diagnosis not present

## 2015-01-11 ENCOUNTER — Ambulatory Visit (INDEPENDENT_AMBULATORY_CARE_PROVIDER_SITE_OTHER): Payer: Medicare Other

## 2015-01-11 ENCOUNTER — Ambulatory Visit: Payer: Medicare Other

## 2015-01-11 ENCOUNTER — Ambulatory Visit (INDEPENDENT_AMBULATORY_CARE_PROVIDER_SITE_OTHER): Payer: Medicare Other | Admitting: Podiatry

## 2015-01-11 ENCOUNTER — Encounter: Payer: Self-pay | Admitting: Podiatry

## 2015-01-11 DIAGNOSIS — S92912D Unspecified fracture of left toe(s), subsequent encounter for fracture with routine healing: Secondary | ICD-10-CM

## 2015-01-11 DIAGNOSIS — E1142 Type 2 diabetes mellitus with diabetic polyneuropathy: Secondary | ICD-10-CM

## 2015-01-11 DIAGNOSIS — M79676 Pain in unspecified toe(s): Secondary | ICD-10-CM

## 2015-01-11 DIAGNOSIS — B351 Tinea unguium: Secondary | ICD-10-CM | POA: Diagnosis not present

## 2015-01-11 NOTE — Progress Notes (Signed)
She presents today for follow-up of her fractured fourth toe left foot also for debridement of her painfully elongated toenails and calluses.  Objective: Vital signs are stable alert and oriented 3. Pulses are strongly palpable. Porokeratosis and calluses plantar aspect of the bilateral foot with sharp indurated thickened dystrophic clinic with mycotic nails 1 through 5 bilaterally. She has mild edema to the fourth digit of the left foot which is nontender. Radiographs taken today demonstrate well healing fracture fourth digit left foot.  Assessment: Limb secondary to onychomycosis 1 through 5 bilateral. Well healing fracture fourth proximal phalanx left foot.  Plan: Follow-up with the as needed for the toe. Follow-up with me in 3 months for nail debridement. Debrided toenails 1 through 5 bilateral covered service secondary to pain today.

## 2015-03-01 DIAGNOSIS — R1310 Dysphagia, unspecified: Secondary | ICD-10-CM | POA: Diagnosis not present

## 2015-03-01 DIAGNOSIS — K625 Hemorrhage of anus and rectum: Secondary | ICD-10-CM | POA: Diagnosis not present

## 2015-03-01 DIAGNOSIS — K219 Gastro-esophageal reflux disease without esophagitis: Secondary | ICD-10-CM | POA: Diagnosis not present

## 2015-03-05 ENCOUNTER — Encounter: Payer: Self-pay | Admitting: Sports Medicine

## 2015-03-05 ENCOUNTER — Ambulatory Visit (INDEPENDENT_AMBULATORY_CARE_PROVIDER_SITE_OTHER): Payer: Medicare Other | Admitting: Sports Medicine

## 2015-03-05 ENCOUNTER — Ambulatory Visit (INDEPENDENT_AMBULATORY_CARE_PROVIDER_SITE_OTHER): Payer: Medicare Other

## 2015-03-05 DIAGNOSIS — M79672 Pain in left foot: Secondary | ICD-10-CM

## 2015-03-05 DIAGNOSIS — S90122A Contusion of left lesser toe(s) without damage to nail, initial encounter: Secondary | ICD-10-CM | POA: Diagnosis not present

## 2015-03-05 NOTE — Progress Notes (Signed)
Patient ID: Theresa Mills, female   DOB: Jan 06, 1951, 65 y.o.   MRN: [id number] Subjective: Theresa Mills is a 65 y.o. female patient who presents to office for evaluation ofLeft foot pain. Patient states that 1 week ago was putting away groceries and a can fell off countertop onto foot; since the area is bruised and a little tender wanted to get it checked out to make sure she didn't break another toe like she did on the 4th toe. Admits to mild pain with extension of toes otherwise Patient denies any other pedal complaints.  There are no active problems to display for this patient.  Current Outpatient Prescriptions on File Prior to Visit  Medication Sig Dispense Refill  . ALPRAZolam (XANAX) 0.5 MG tablet daily.    . carbidopa-levodopa (SINEMET CR) 50-200 MG per tablet daily.    . carvedilol (COREG) 6.25 MG tablet daily.    . celecoxib (CELEBREX) 200 MG capsule Take 200 mg by mouth as needed for pain.    . citalopram (CELEXA) 20 MG tablet daily.    Marland Kitchen glimepiride (AMARYL) 2 MG tablet daily.    . INVOKANA 300 MG TABS daily.    Marland Kitchen KOMBIGLYZE XR 05-998 MG TB24 daily.    Marland Kitchen losartan-hydrochlorothiazide (HYZAAR) 50-12.5 MG per tablet daily.    Marland Kitchen omeprazole (PRILOSEC) 40 MG capsule daily.    . ONE TOUCH ULTRA TEST test strip daily.    Glory Rosebush DELICA LANCETS [id number] MISC daily.    . pramipexole (MIRAPEX) 0.5 MG tablet daily.    . simvastatin (ZOCOR) 40 MG tablet daily.    Marland Kitchen SYNTHROID 200 MCG tablet daily.     No current facility-administered medications on file prior to visit.   Allergies  Allergen Reactions  . Codeine Other (See Comments)    HALLUCINATIONS  . Ephadrene [Cholestatin] Other (See Comments)    HYPER  . Valium [Diazepam] Other (See Comments)    OVERLY SENSITIVE/TO STRONG    Objective:  General: Alert and oriented x3 in no acute distress, cane assisted gait  Dermatology: No open lesions bilateral lower extremities, no webspace macerations, + ecchymosis to base of left 3rd toe  with mild soft tissue swelling, no warmth, no acute signs of infection. Nails short and thickened 1-5 bilateral, asymptomatic.   Vascular: Dorsalis Pedis and Posterior Tibial pedal pulses 1/4, Capillary Fill Time 3 seconds,(+) pedal hair growth bilateral, Temperature gradient within normal limits.  Neurology: Johney Maine sensation intact via light touch bilateral.   Musculoskeletal: Mild tenderness with palpation at base of 3rd toe on left, mild guarding with range of motion,No pain with calf compression bilateral. Planus foot type bilateral with severe abdductus and limp deformity on right foot.   Xrays  Left Foot 2 views    Impression: Healed fracture of left 4th toe, No other fractures/dislocation, Significant inferior and posterior heel spurs, midfoot breach with arthritic changes, pes planus foot type. No foreign body. Soft tissues within normal limits  Assessment and Plan: Problem List Items Addressed This Visit    None    Visit Diagnoses    Left foot pain    -  Primary    Relevant Orders    DG Foot 2 Views Left    Contusion of lesser toe of left foot without damage to nail, initial encounter        3rd toe, contusion of skin and bone with no fracture, secondary to trauma/dropping can on foot        -Complete examination performed -Xrays  reviewed -Discussed treatement options for skin and bone contusion to left 3rd toe -Recommend to return to post op shoe until pain and ecchymosis improves -Recommend ice 1-2x daily to affected area -Recommend cont with current anti-inflammatory  -Patient to return to office in 1 month for re-check of contusion site or sooner if condition worsens.  Landis Martins, DPM

## 2015-03-12 DIAGNOSIS — E1151 Type 2 diabetes mellitus with diabetic peripheral angiopathy without gangrene: Secondary | ICD-10-CM | POA: Diagnosis not present

## 2015-03-12 DIAGNOSIS — M170 Bilateral primary osteoarthritis of knee: Secondary | ICD-10-CM | POA: Diagnosis not present

## 2015-03-12 DIAGNOSIS — Z0001 Encounter for general adult medical examination with abnormal findings: Secondary | ICD-10-CM | POA: Diagnosis not present

## 2015-03-12 DIAGNOSIS — G20 Parkinson's disease: Secondary | ICD-10-CM | POA: Diagnosis not present

## 2015-03-12 DIAGNOSIS — F329 Major depressive disorder, single episode, unspecified: Secondary | ICD-10-CM | POA: Diagnosis not present

## 2015-03-29 DIAGNOSIS — E1165 Type 2 diabetes mellitus with hyperglycemia: Secondary | ICD-10-CM | POA: Diagnosis not present

## 2015-03-29 DIAGNOSIS — E063 Autoimmune thyroiditis: Secondary | ICD-10-CM | POA: Diagnosis not present

## 2015-03-29 DIAGNOSIS — C73 Malignant neoplasm of thyroid gland: Secondary | ICD-10-CM | POA: Diagnosis not present

## 2015-03-29 DIAGNOSIS — E785 Hyperlipidemia, unspecified: Secondary | ICD-10-CM | POA: Diagnosis not present

## 2015-03-29 DIAGNOSIS — E668 Other obesity: Secondary | ICD-10-CM | POA: Diagnosis not present

## 2015-03-31 DIAGNOSIS — H903 Sensorineural hearing loss, bilateral: Secondary | ICD-10-CM | POA: Diagnosis not present

## 2015-04-01 ENCOUNTER — Encounter: Payer: Self-pay | Admitting: *Deleted

## 2015-04-02 ENCOUNTER — Ambulatory Visit: Payer: Medicare Other | Admitting: Anesthesiology

## 2015-04-02 ENCOUNTER — Ambulatory Visit
Admission: RE | Admit: 2015-04-02 | Discharge: 2015-04-02 | Disposition: A | Discharge status: Home / Self Care | Payer: Medicare Other | Source: Ambulatory Visit | Attending: Gastroenterology | Admitting: Gastroenterology

## 2015-04-02 ENCOUNTER — Encounter: Payer: Self-pay | Admitting: *Deleted

## 2015-04-02 ENCOUNTER — Encounter
Admission: RE | Disposition: A | Discharge status: Home / Self Care | Payer: Self-pay | Source: Ambulatory Visit | Attending: Gastroenterology

## 2015-04-02 DIAGNOSIS — K921 Melena: Secondary | ICD-10-CM | POA: Diagnosis not present

## 2015-04-02 DIAGNOSIS — R1310 Dysphagia, unspecified: Secondary | ICD-10-CM | POA: Insufficient documentation

## 2015-04-02 DIAGNOSIS — Z79899 Other long term (current) drug therapy: Secondary | ICD-10-CM | POA: Diagnosis not present

## 2015-04-02 DIAGNOSIS — Z888 Allergy status to other drugs, medicaments and biological substances status: Secondary | ICD-10-CM | POA: Insufficient documentation

## 2015-04-02 DIAGNOSIS — M1990 Unspecified osteoarthritis, unspecified site: Secondary | ICD-10-CM | POA: Insufficient documentation

## 2015-04-02 DIAGNOSIS — K2270 Barrett's esophagus without dysplasia: Secondary | ICD-10-CM | POA: Insufficient documentation

## 2015-04-02 DIAGNOSIS — K2970 Gastritis, unspecified, without bleeding: Secondary | ICD-10-CM | POA: Insufficient documentation

## 2015-04-02 DIAGNOSIS — K640 First degree hemorrhoids: Secondary | ICD-10-CM | POA: Insufficient documentation

## 2015-04-02 DIAGNOSIS — Z885 Allergy status to narcotic agent status: Secondary | ICD-10-CM | POA: Diagnosis not present

## 2015-04-02 DIAGNOSIS — E119 Type 2 diabetes mellitus without complications: Secondary | ICD-10-CM | POA: Diagnosis not present

## 2015-04-02 DIAGNOSIS — K5730 Diverticulosis of large intestine without perforation or abscess without bleeding: Secondary | ICD-10-CM | POA: Diagnosis not present

## 2015-04-02 DIAGNOSIS — D122 Benign neoplasm of ascending colon: Secondary | ICD-10-CM | POA: Diagnosis not present

## 2015-04-02 DIAGNOSIS — G4730 Sleep apnea, unspecified: Secondary | ICD-10-CM | POA: Diagnosis not present

## 2015-04-02 DIAGNOSIS — F419 Anxiety disorder, unspecified: Secondary | ICD-10-CM | POA: Insufficient documentation

## 2015-04-02 DIAGNOSIS — K625 Hemorrhage of anus and rectum: Secondary | ICD-10-CM | POA: Diagnosis not present

## 2015-04-02 DIAGNOSIS — K219 Gastro-esophageal reflux disease without esophagitis: Secondary | ICD-10-CM | POA: Insufficient documentation

## 2015-04-02 DIAGNOSIS — G20 Parkinson's disease: Secondary | ICD-10-CM | POA: Diagnosis not present

## 2015-04-02 DIAGNOSIS — Z800 Family history of malignant neoplasm of digestive organs: Secondary | ICD-10-CM | POA: Diagnosis not present

## 2015-04-02 DIAGNOSIS — E079 Disorder of thyroid, unspecified: Secondary | ICD-10-CM | POA: Diagnosis not present

## 2015-04-02 DIAGNOSIS — K210 Gastro-esophageal reflux disease with esophagitis: Secondary | ICD-10-CM | POA: Diagnosis not present

## 2015-04-02 DIAGNOSIS — K5790 Diverticulosis of intestine, part unspecified, without perforation or abscess without bleeding: Secondary | ICD-10-CM | POA: Diagnosis not present

## 2015-04-02 DIAGNOSIS — K635 Polyp of colon: Secondary | ICD-10-CM | POA: Diagnosis not present

## 2015-04-02 HISTORY — DX: Other specified health status: Z78.9

## 2015-04-02 HISTORY — PX: ESOPHAGOGASTRODUODENOSCOPY (EGD) WITH PROPOFOL: SHX5813

## 2015-04-02 HISTORY — PX: COLONOSCOPY WITH PROPOFOL: SHX5780

## 2015-04-02 LAB — GLUCOSE, CAPILLARY: GLUCOSE-CAPILLARY: 92 mg/dL (ref 65–99)

## 2015-04-02 SURGERY — COLONOSCOPY WITH PROPOFOL
Anesthesia: General

## 2015-04-02 MED ORDER — GLYCOPYRROLATE 0.2 MG/ML IJ SOLN
INTRAMUSCULAR | Status: DC | PRN
  Administered 2015-04-02 (×2): 0.2 mg via INTRAVENOUS

## 2015-04-02 MED ORDER — LIDOCAINE HCL (PF) 2 % IJ SOLN
INTRAMUSCULAR | Status: DC | PRN
  Administered 2015-04-02: 60 mg

## 2015-04-02 MED ORDER — SODIUM CHLORIDE 0.9 % IV SOLN
INTRAVENOUS | Status: DC
  Administered 2015-04-02: 08:00:00 via INTRAVENOUS

## 2015-04-02 MED ORDER — IPRATROPIUM-ALBUTEROL 0.5-2.5 (3) MG/3ML IN SOLN
RESPIRATORY_TRACT | Status: AC
  Filled 2015-04-02: qty 3

## 2015-04-02 MED ORDER — PROPOFOL 500 MG/50ML IV EMUL
INTRAVENOUS | Status: DC | PRN
  Administered 2015-04-02: 50 ug/kg/min via INTRAVENOUS

## 2015-04-02 MED ORDER — MIDAZOLAM HCL 5 MG/5ML IJ SOLN
INTRAMUSCULAR | Status: DC | PRN
  Administered 2015-04-02: 1 mg via INTRAVENOUS

## 2015-04-02 MED ORDER — PROPOFOL 10 MG/ML IV BOLUS: INTRAVENOUS | Status: DC | PRN

## 2015-04-02 MED ORDER — PROPOFOL 10 MG/ML IV BOLUS
INTRAVENOUS | Status: DC | PRN
  Administered 2015-04-02: 30 mg via INTRAVENOUS

## 2015-04-02 MED ORDER — PROPOFOL 500 MG/50ML IV EMUL: INTRAVENOUS | Status: DC | PRN

## 2015-04-02 MED ORDER — EPHEDRINE SULFATE 50 MG/ML IJ SOLN
INTRAMUSCULAR | Status: DC | PRN
  Administered 2015-04-02: 10 mg via INTRAVENOUS

## 2015-04-02 MED ORDER — IPRATROPIUM-ALBUTEROL 0.5-2.5 (3) MG/3ML IN SOLN
3.0000 mL | Freq: Once | RESPIRATORY_TRACT | Status: AC
Start: 2015-04-02 — End: 2015-04-02
  Administered 2015-04-02: 3 mL via RESPIRATORY_TRACT

## 2015-04-02 MED ORDER — FENTANYL CITRATE (PF) 100 MCG/2ML IJ SOLN
INTRAMUSCULAR | Status: DC | PRN
  Administered 2015-04-02: 50 ug via INTRAVENOUS

## 2015-04-02 NOTE — Anesthesia Postprocedure Evaluation (Signed)
Anesthesia Post Note  Patient: TANG RATHJE  Procedure(s) Performed: Procedure(s) (LRB): COLONOSCOPY WITH PROPOFOL (N/A) ESOPHAGOGASTRODUODENOSCOPY (EGD) WITH PROPOFOL (N/A)  Patient location during evaluation: Endoscopy Anesthesia Type: General Level of consciousness: awake and alert Pain management: pain level controlled Vital Signs Assessment: post-procedure vital signs reviewed and stable Respiratory status: spontaneous breathing, nonlabored ventilation, respiratory function stable and patient connected to nasal cannula oxygen Cardiovascular status: blood pressure returned to baseline and stable Postop Assessment: no signs of nausea or vomiting Anesthetic complications: no    Last Vitals:  Filed Vitals:   04/02/15 0940 04/02/15 0950  BP: 117/43 113/78  Pulse: 73 61  Temp:    Resp: 17 16    Last Pain: There were no vitals filed for this visit.               Precious Haws Lucillie Kiesel

## 2015-04-02 NOTE — Anesthesia Preprocedure Evaluation (Addendum)
Anesthesia Evaluation  Patient identified by MRN, date of birth, ID band Patient awake    Reviewed: Allergy & Precautions, H&P , NPO status , Patient's Chart, lab work & pertinent test results  History of Anesthesia Complications (+) DIFFICULT IV STICK / SPECIAL LINE and history of anesthetic complications  Airway Mallampati: III  TM Distance: >3 FB Neck ROM: limited    Dental  (+) Poor Dentition, Chipped   Pulmonary neg pulmonary ROS, shortness of breath and with exertion, sleep apnea, Continuous Positive Airway Pressure Ventilation and Oxygen sleep apnea ,    Pulmonary exam normal breath sounds clear to auscultation       Cardiovascular Exercise Tolerance: Poor hypertension, (-) angina+ DOE  (-) Past MI negative cardio ROS Normal cardiovascular exam Rhythm:regular Rate:Normal     Neuro/Psych PSYCHIATRIC DISORDERS Anxiety negative neurological ROS     GI/Hepatic negative GI ROS, Neg liver ROS, neg GERD  ,  Endo/Other  diabetes, Type 2  Renal/GU negative Renal ROS  negative genitourinary   Musculoskeletal  (+) Arthritis ,   Abdominal   Peds  Hematology negative hematology ROS (+)   Anesthesia Other Findings Past Medical History:   Diabetes ([place])                                               Osteoarthritis                                               HBP (high blood pressure)                                    Parkinson disease (H[facility])                                      Anxiety                                                      Thyroid disease                                             Past Surgical History:   LAPAROSCOPIC HYSTERECTOMY                                     TONSILLECTOMY                                                 GALLBLADDER SURGERY  CATARACT EXTRACTION                             Bilateral              THYROID SURGERY                                                CHOLECYSTECTOMY                                               ABDOMINAL HYSTERECTOMY                                       BMI    Body Mass Index   42.89 kg/m 2      Reproductive/Obstetrics negative OB ROS                            Anesthesia Physical Anesthesia Plan  ASA: IV  Anesthesia Plan: General   Post-op Pain Management:    Induction:   Airway Management Planned:   Additional Equipment:   Intra-op Plan:   Post-operative Plan:   Informed Consent: I have reviewed the patients History and Physical, chart, labs and discussed the procedure including the risks, benefits and alternatives for the proposed anesthesia with the patient or authorized representative who has indicated his/her understanding and acceptance.   Dental Advisory Given  Plan Discussed with: Anesthesiologist, CRNA and Surgeon  Anesthesia Plan Comments:         Anesthesia Quick Evaluation

## 2015-04-02 NOTE — Op Note (Signed)
[facility] Patient Name: Theresa Mills Procedure Date: 04/02/2015 8:33 AM MRN: [id number] Account #: [id number] Date of Birth: [birth year] Admit Type: Outpatient Age: 65 Room: [facility] ENDO ROOM 4 Gender: Female Note Status: Finalized Procedure:            Upper GI endoscopy Indications:          Dysphagia, Heartburn, Suspected esophageal reflux(now                        resolved) Patient Profile:      This is a 65 year old female. Providers:            Gerrit Heck. Rayann Heman, MD Referring MD:         Lavera Guise, MD (Referring MD) Medicines:            Propofol per Anesthesia Complications:        No immediate complications. Estimated blood loss:                        Minimal. Procedure:            Pre-Anesthesia Assessment:                       - Prior to the procedure, a History and Physical was                        performed, and patient medications, allergies and                        sensitivities were reviewed. The patient's tolerance of                        previous anesthesia was reviewed.                       After obtaining informed consent, the endoscope was                        passed under direct vision. Throughout the procedure,                        the patient's blood pressure, pulse, and oxygen                        saturations were monitored continuously. The Endoscope                        was introduced through the mouth, and advanced to the                        second part of duodenum. The upper GI endoscopy was                        accomplished without difficulty. The patient tolerated                        the procedure well. Findings:      Two tongues of salmon-colored mucosa were present. No other visible       abnormalities were present. The maximum longitudinal extent of these       esophageal mucosal changes  was 1 cm in length. Mucosa was biopsied with       a cold forceps for histology. One specimen bottle  was sent to pathology.      Localized mild inflammation characterized by erythema was found in the       gastric body.      The examined duodenum was normal. Impression:           - Salmon-colored mucosa suspicious for short-segment                        Barrett's esophagus. Biopsied.                       - Gastritis.                       - Normal examined duodenum. Recommendation:       - Perform a colonoscopy today.                       - The findings and recommendations were discussed with                        the patient.                       - The findings and recommendations were discussed with                        the patient's family.                       - Await pathology results. Procedure Code(s):    --- Professional ---                       [phone], Esophagogastroduodenoscopy, flexible, transoral;                        with biopsy, single or multiple Diagnosis Code(s):    --- Professional ---                       K22.8, Other specified diseases of esophagus                       K29.70, Gastritis, unspecified, without bleeding                       R13.10, Dysphagia, unspecified                       R12, Heartburn CPT copyright 2016 American Medical Association. All rights reserved. The codes documented in this report are preliminary and upon coder review may  be revised to meet current compliance requirements. Mellody Life, MD 04/02/2015 8:47:45 AM This report has been signed electronically. Number of Addenda: 0 Note Initiated On: 04/02/2015 8:33 AM      [facility]

## 2015-04-02 NOTE — Transfer of Care (Signed)
Immediate Anesthesia Transfer of Care Note  Patient: Theresa Mills  Procedure(s) Performed: Procedure(s): COLONOSCOPY WITH PROPOFOL (N/A) ESOPHAGOGASTRODUODENOSCOPY (EGD) WITH PROPOFOL (N/A)  Patient Location: PACU  Anesthesia Type:General  Level of Consciousness: sedated  Airway & Oxygen Therapy: Patient Spontanous Breathing and Patient connected to nasal cannula oxygen  Post-op Assessment: Report given to RN and Post -op Vital signs reviewed and stable  Post vital signs: Reviewed and stable  Last Vitals:  Filed Vitals:   04/02/15 0716  BP: 141/95  Pulse: 65  Temp: 37.4 C  Resp: 16    Complications: No apparent anesthesia complications

## 2015-04-02 NOTE — H&P (Signed)
Correction: colonoscopy is for rectal bleeeding

## 2015-04-02 NOTE — H&P (Signed)
Primary Care Physician:  Christie Nottingham., PA  Pre-Procedure History & Physical: HPI:  Theresa Mills is a 65 y.o. female is here for an endoscopy/colonoscopy   Past Medical History  Diagnosis Date  . Diabetes ([place])   . Osteoarthritis   . HBP (high blood pressure)   . Parkinson disease ([place])   . Anxiety   . Thyroid disease   . Difficult intravenous access     Past Surgical History  Procedure Laterality Date  . Laparoscopic hysterectomy    . Tonsillectomy    . Gallbladder surgery    . Cataract extraction Bilateral   . Thyroid surgery    . Cholecystectomy    . Abdominal hysterectomy      Prior to Admission medications   Medication Sig Start Date End Date Taking? Authorizing Provider  ALPRAZolam Duanne Moron) 0.5 MG tablet daily. 11/01/12  Yes Historical Provider, MD  carbidopa-levodopa (SINEMET CR) 50-200 MG per tablet daily. 11/01/12  Yes Historical Provider, MD  celecoxib (CELEBREX) 200 MG capsule Take 200 mg by mouth as needed for pain.   Yes Historical Provider, MD  carvedilol (COREG) 6.25 MG tablet daily. 11/01/12   Historical Provider, MD  citalopram (CELEXA) 20 MG tablet daily. 11/18/12   Historical Provider, MD  glimepiride (AMARYL) 2 MG tablet daily. 11/18/12   Historical Provider, MD  INVOKANA 300 MG TABS daily. 11/18/12   Historical Provider, MD  KOMBIGLYZE XR 05-998 MG TB24 daily. 11/18/12   Historical Provider, MD  losartan-hydrochlorothiazide (HYZAAR) 50-12.5 MG per tablet daily. 11/18/12   Historical Provider, MD  omeprazole (PRILOSEC) 40 MG capsule daily. 11/01/12   Historical Provider, MD  ONE TOUCH ULTRA TEST test strip daily. 10/18/12   Historical Provider, MD  Jonetta Speak LANCETS [phone] MISC daily. 09/23/12   Historical Provider, MD  pramipexole (MIRAPEX) 0.5 MG tablet daily. 11/01/12   Historical Provider, MD  simvastatin (ZOCOR) 40 MG tablet daily. 11/01/12   Historical Provider, MD  SYNTHROID 200 MCG tablet daily. 11/01/12   Historical Provider, MD     Allergies as of 03/23/2015 - Review Complete 03/05/2015  Allergen Reaction Noted  . Codeine Other (See Comments) 11/20/2012  . Ephadrene [cholestatin] Other (See Comments) 11/20/2012  . Valium [diazepam] Other (See Comments) 11/20/2012    History reviewed. No pertinent family history.  Social History   Social History  . Marital Status: Widowed    Spouse Name: N/A  . Number of Children: N/A  . Years of Education: N/A   Occupational History  . Not on file.   Social History Main Topics  . Smoking status: Never Smoker   . Smokeless tobacco: Never Used  . Alcohol Use: No     Comment: OCCASIONALLY  . Drug Use: No  . Sexual Activity: Not on file   Other Topics Concern  . Not on file   Social History Narrative     Physical Exam: BP 141/95 mmHg  Pulse 65  Temp(Src) 99.3 F (37.4 C) (Tympanic)  Resp 16  Ht 5\' 4"  (1.626 m)  Wt 113.399 kg (250 lb)  BMI 42.89 kg/m2  SpO2 96% General:   Alert,  pleasant and cooperative in NAD Head:  Normocephalic and atraumatic. Neck:  Supple; no masses or thyromegaly. Lungs:  Clear throughout to auscultation.    Heart:  Regular rate and rhythm. Abdomen:  Soft, nontender and nondistended. Normal bowel sounds, without guarding, and without rebound.   Neurologic:  Alert and  oriented x4;  grossly normal neurologically.  Impression/Plan: Theresa Mills  is here for an endoscopy to be performed for dysphagia, GERD,  Colonoscopy for screening, fam hx colon ca  Risks, benefits, limitations, and alternatives regarding  Endoscopy/colonoscopy have been reviewed with the patient.  Questions have been answered.  All parties agreeable.   Josefine Class, MD  04/02/2015, 8:32 AM

## 2015-04-02 NOTE — Discharge Instructions (Signed)

## 2015-04-02 NOTE — Op Note (Signed)
[facility] Patient Name: Theresa Mills Procedure Date: 04/02/2015 8:47 AM MRN: [id number] Account #: [id number] Date of Birth: [birth year] Admit Type: Outpatient Age: 65 Room: [facility] ENDO ROOM 4 Gender: Female Note Status: Finalized Procedure:            Colonoscopy Indications:          Last colonoscopy 10 years ago, Hematochezia, Family                        history of colon cancer in a first-degree relative Patient Profile:      This is a 65 year old female. Providers:            Gerrit Heck. Rayann Heman, MD Referring MD:         Lavera Guise, MD (Referring MD) Medicines:            Propofol per Anesthesia Complications:        No immediate complications. Procedure:            Pre-Anesthesia Assessment:                       - Prior to the procedure, a History and Physical was                        performed, and patient medications, allergies and                        sensitivities were reviewed. The patient's tolerance of                        previous anesthesia was reviewed.                       After obtaining informed consent, the colonoscope was                        passed under direct vision. Throughout the procedure,                        the patient's blood pressure, pulse, and oxygen                        saturations were monitored continuously. The Olympus                        CF-H180AL colonoscope ( S#: [id number] ) was introduced                        through the anus and advanced to the the cecum,                        identified by appendiceal orifice and ileocecal valve.                        The colonoscopy was performed without difficulty. The                        patient tolerated the procedure well. The quality of                        the bowel  preparation was good. Findings:      The perianal and digital rectal examinations were normal.      A 5 mm polyp was found in the ascending colon. The polyp was sessile.   The polyp was removed with a hot snare. Resection and retrieval were       complete.      A few large-mouthed diverticula were found in the sigmoid colon.      Internal hemorrhoids were found during retroflexion. The hemorrhoids       were Grade I (internal hemorrhoids that do not prolapse).      The exam was otherwise without abnormality. Impression:           - One 5 mm polyp in the ascending colon, removed with a                        hot snare. Resected and retrieved.                       - Diverticulosis in the sigmoid colon.                       - Internal hemorrhoids.                       - The examination was otherwise normal. Recommendation:       - Observe patient in GI recovery unit.                       - High fiber diet.                       - Continue present medications.                       - Await pathology results.                       - Repeat colonoscopy for surveillance based on                        pathology results.                       - Return to referring physician.                       - The findings and recommendations were discussed with                        the patient.                       - The findings and recommendations were discussed with                        the patient's family. Procedure Code(s):    --- Professional ---                       [phone], Colonoscopy, flexible; with removal of tumor(s),                        polyp(s), or other lesion(s) by snare technique Diagnosis Code(s):    ---  Professional ---                       D12.2, Benign neoplasm of ascending colon                       K64.0, First degree hemorrhoids                       K92.1, Melena (includes Hematochezia)                       Z80.0, Family history of malignant neoplasm of                        digestive organs                       K57.30, Diverticulosis of large intestine without                        perforation or abscess without bleeding CPT  copyright 2016 American Medical Association. All rights reserved. The codes documented in this report are preliminary and upon coder review may  be revised to meet current compliance requirements. Mellody Life, MD 04/02/2015 9:14:14 AM This report has been signed electronically. Number of Addenda: 0 Note Initiated On: 04/02/2015 8:47 AM Scope Withdrawal Time: 0 hours 9 minutes 45 seconds  Total Procedure Duration: 0 hours 17 minutes 52 seconds       [facility]

## 2015-04-03 ENCOUNTER — Encounter: Payer: Self-pay | Admitting: Gastroenterology

## 2015-04-05 ENCOUNTER — Ambulatory Visit (INDEPENDENT_AMBULATORY_CARE_PROVIDER_SITE_OTHER): Payer: Medicare Other

## 2015-04-05 ENCOUNTER — Encounter: Payer: Self-pay | Admitting: Podiatry

## 2015-04-05 ENCOUNTER — Ambulatory Visit (INDEPENDENT_AMBULATORY_CARE_PROVIDER_SITE_OTHER): Payer: Medicare Other | Admitting: Podiatry

## 2015-04-05 VITALS — BP 134/73 | HR 69 | Resp 16

## 2015-04-05 DIAGNOSIS — M79676 Pain in unspecified toe(s): Secondary | ICD-10-CM

## 2015-04-05 DIAGNOSIS — E785 Hyperlipidemia, unspecified: Secondary | ICD-10-CM | POA: Diagnosis not present

## 2015-04-05 DIAGNOSIS — S90122D Contusion of left lesser toe(s) without damage to nail, subsequent encounter: Secondary | ICD-10-CM | POA: Diagnosis not present

## 2015-04-05 DIAGNOSIS — B351 Tinea unguium: Secondary | ICD-10-CM | POA: Diagnosis not present

## 2015-04-05 DIAGNOSIS — E039 Hypothyroidism, unspecified: Secondary | ICD-10-CM | POA: Diagnosis not present

## 2015-04-05 DIAGNOSIS — E063 Autoimmune thyroiditis: Secondary | ICD-10-CM | POA: Diagnosis not present

## 2015-04-05 DIAGNOSIS — Q828 Other specified congenital malformations of skin: Secondary | ICD-10-CM | POA: Diagnosis not present

## 2015-04-05 DIAGNOSIS — M159 Polyosteoarthritis, unspecified: Secondary | ICD-10-CM | POA: Diagnosis not present

## 2015-04-05 DIAGNOSIS — E668 Other obesity: Secondary | ICD-10-CM | POA: Diagnosis not present

## 2015-04-05 DIAGNOSIS — E1142 Type 2 diabetes mellitus with diabetic polyneuropathy: Secondary | ICD-10-CM

## 2015-04-05 DIAGNOSIS — C73 Malignant neoplasm of thyroid gland: Secondary | ICD-10-CM | POA: Diagnosis not present

## 2015-04-05 DIAGNOSIS — E1165 Type 2 diabetes mellitus with hyperglycemia: Secondary | ICD-10-CM | POA: Diagnosis not present

## 2015-04-05 DIAGNOSIS — E118 Type 2 diabetes mellitus with unspecified complications: Secondary | ICD-10-CM | POA: Diagnosis not present

## 2015-04-05 LAB — SURGICAL PATHOLOGY

## 2015-04-05 NOTE — Progress Notes (Signed)
She presents today for follow-up of her contusion fourth toe left foot. She states it seems to be doing much improved. She also states her toenails are long and thick and painful.  Objective: Vital signs stable alert and oriented 3. Pulses are palpable. Long thick yellow dystrophic onychomycotic nails. Radiographs do not demonstrate any type of fracture fourth toe left foot.  Assessment: Contusion toe well-healing fourth left. Pain in limb secondary to onychomycosis and porokeratosis bilaterally.  Plan: Debrided all reactive hyperkeratosis today debridement of nails 1 through 5 bilateral covered service secondary to pain. X-rays taken today for contusion.

## 2015-04-06 DIAGNOSIS — M8580 Other specified disorders of bone density and structure, unspecified site: Secondary | ICD-10-CM | POA: Diagnosis not present

## 2015-04-06 DIAGNOSIS — E2839 Other primary ovarian failure: Secondary | ICD-10-CM | POA: Diagnosis not present

## 2015-04-06 DIAGNOSIS — Z13820 Encounter for screening for osteoporosis: Secondary | ICD-10-CM | POA: Diagnosis not present

## 2015-04-06 DIAGNOSIS — Z1231 Encounter for screening mammogram for malignant neoplasm of breast: Secondary | ICD-10-CM | POA: Diagnosis not present

## 2015-04-12 ENCOUNTER — Ambulatory Visit: Payer: Medicare Other | Admitting: Podiatry

## 2015-04-26 DIAGNOSIS — R0602 Shortness of breath: Secondary | ICD-10-CM | POA: Diagnosis not present

## 2015-04-26 DIAGNOSIS — Z6841 Body Mass Index (BMI) 40.0 and over, adult: Secondary | ICD-10-CM | POA: Diagnosis not present

## 2015-04-26 DIAGNOSIS — G4730 Sleep apnea, unspecified: Secondary | ICD-10-CM | POA: Diagnosis not present

## 2015-05-17 DIAGNOSIS — E119 Type 2 diabetes mellitus without complications: Secondary | ICD-10-CM | POA: Diagnosis not present

## 2015-06-02 ENCOUNTER — Ambulatory Visit: Payer: Medicare Other | Admitting: Podiatry

## 2015-06-07 ENCOUNTER — Ambulatory Visit (INDEPENDENT_AMBULATORY_CARE_PROVIDER_SITE_OTHER): Payer: Medicare Other | Admitting: Podiatry

## 2015-06-07 ENCOUNTER — Encounter: Payer: Self-pay | Admitting: Podiatry

## 2015-06-07 DIAGNOSIS — Q828 Other specified congenital malformations of skin: Secondary | ICD-10-CM

## 2015-06-07 DIAGNOSIS — M79676 Pain in unspecified toe(s): Secondary | ICD-10-CM

## 2015-06-07 DIAGNOSIS — E1142 Type 2 diabetes mellitus with diabetic polyneuropathy: Secondary | ICD-10-CM | POA: Diagnosis not present

## 2015-06-07 DIAGNOSIS — B351 Tinea unguium: Secondary | ICD-10-CM | POA: Diagnosis not present

## 2015-06-08 NOTE — Progress Notes (Signed)
She presents today chief complaint of painful elongated toenails and multiple calluses.  Objective: Vital signs are stable she is alert and oriented 3. Pulses are palpable. Toenails are grossly elongated painful palpation as well as debridement. Reactive hyperkeratosis to the medial aspect of the first metatarsophalangeal joint right foot greater than left. As well as distal clavi to the second third digits of the right foot. These appear to be preoperatively lesions.  Assessment: Pain in limb secondary to onychomycosis and porokeratosis.  Plan: Debridement of all reactive hyperkeratotic tissue and nails 1 through 5 bilateral. Also I will follow-up with her in 2 months.

## 2015-06-14 DIAGNOSIS — I5030 Unspecified diastolic (congestive) heart failure: Secondary | ICD-10-CM | POA: Diagnosis not present

## 2015-06-14 DIAGNOSIS — K2270 Barrett's esophagus without dysplasia: Secondary | ICD-10-CM | POA: Diagnosis not present

## 2015-06-14 DIAGNOSIS — D519 Vitamin B12 deficiency anemia, unspecified: Secondary | ICD-10-CM | POA: Diagnosis not present

## 2015-06-14 DIAGNOSIS — K219 Gastro-esophageal reflux disease without esophagitis: Secondary | ICD-10-CM | POA: Diagnosis not present

## 2015-06-14 DIAGNOSIS — M170 Bilateral primary osteoarthritis of knee: Secondary | ICD-10-CM | POA: Diagnosis not present

## 2015-06-14 DIAGNOSIS — E1151 Type 2 diabetes mellitus with diabetic peripheral angiopathy without gangrene: Secondary | ICD-10-CM | POA: Diagnosis not present

## 2015-07-05 DIAGNOSIS — G3184 Mild cognitive impairment, so stated: Secondary | ICD-10-CM | POA: Diagnosis not present

## 2015-07-05 DIAGNOSIS — Z6841 Body Mass Index (BMI) 40.0 and over, adult: Secondary | ICD-10-CM | POA: Diagnosis not present

## 2015-07-05 DIAGNOSIS — G20 Parkinson's disease: Secondary | ICD-10-CM | POA: Diagnosis not present

## 2015-07-12 ENCOUNTER — Ambulatory Visit: Payer: Medicare Other | Admitting: Podiatry

## 2015-08-16 ENCOUNTER — Ambulatory Visit (INDEPENDENT_AMBULATORY_CARE_PROVIDER_SITE_OTHER): Payer: Medicare Other | Admitting: Podiatry

## 2015-08-16 ENCOUNTER — Encounter: Payer: Self-pay | Admitting: Podiatry

## 2015-08-16 DIAGNOSIS — B351 Tinea unguium: Secondary | ICD-10-CM

## 2015-08-16 DIAGNOSIS — M79676 Pain in unspecified toe(s): Secondary | ICD-10-CM

## 2015-08-16 DIAGNOSIS — Q828 Other specified congenital malformations of skin: Secondary | ICD-10-CM | POA: Diagnosis not present

## 2015-08-16 NOTE — Progress Notes (Signed)
She presents today with a chief complaint of painful elongated toenails 1 through 5 bilaterally. She is also concerned of painful calluses.  Objective: Vital signs are stable alert and oriented 3 pulses are palpable bilateral. No open lesions or wounds noted. Toenails are thick yellow dystrophic onychomycotic reactive hyperkeratosis medial aspect of the hallux interphalangeal joint.  Assessment: Pain limb secondary to onychomycosis and poor keratomas.  Plan: Debridement of toenails 1 through 5 bilateral covered service secondary to pain.

## 2015-09-28 ENCOUNTER — Other Ambulatory Visit: Payer: Self-pay

## 2015-09-29 DIAGNOSIS — E039 Hypothyroidism, unspecified: Secondary | ICD-10-CM | POA: Diagnosis not present

## 2015-09-29 DIAGNOSIS — E063 Autoimmune thyroiditis: Secondary | ICD-10-CM | POA: Diagnosis not present

## 2015-09-29 DIAGNOSIS — E1165 Type 2 diabetes mellitus with hyperglycemia: Secondary | ICD-10-CM | POA: Diagnosis not present

## 2015-10-06 DIAGNOSIS — E785 Hyperlipidemia, unspecified: Secondary | ICD-10-CM | POA: Diagnosis not present

## 2015-10-06 DIAGNOSIS — M159 Polyosteoarthritis, unspecified: Secondary | ICD-10-CM | POA: Diagnosis not present

## 2015-10-06 DIAGNOSIS — E668 Other obesity: Secondary | ICD-10-CM | POA: Diagnosis not present

## 2015-10-06 DIAGNOSIS — C73 Malignant neoplasm of thyroid gland: Secondary | ICD-10-CM | POA: Diagnosis not present

## 2015-10-06 DIAGNOSIS — E1165 Type 2 diabetes mellitus with hyperglycemia: Secondary | ICD-10-CM | POA: Diagnosis not present

## 2015-10-06 DIAGNOSIS — E039 Hypothyroidism, unspecified: Secondary | ICD-10-CM | POA: Diagnosis not present

## 2015-10-06 DIAGNOSIS — E118 Type 2 diabetes mellitus with unspecified complications: Secondary | ICD-10-CM | POA: Diagnosis not present

## 2015-10-06 DIAGNOSIS — E063 Autoimmune thyroiditis: Secondary | ICD-10-CM | POA: Diagnosis not present

## 2015-10-06 DIAGNOSIS — Z23 Encounter for immunization: Secondary | ICD-10-CM | POA: Diagnosis not present

## 2015-10-12 DIAGNOSIS — K2270 Barrett's esophagus without dysplasia: Secondary | ICD-10-CM | POA: Diagnosis not present

## 2015-10-12 DIAGNOSIS — M170 Bilateral primary osteoarthritis of knee: Secondary | ICD-10-CM | POA: Diagnosis not present

## 2015-10-12 DIAGNOSIS — E1151 Type 2 diabetes mellitus with diabetic peripheral angiopathy without gangrene: Secondary | ICD-10-CM | POA: Diagnosis not present

## 2015-10-12 DIAGNOSIS — I10 Essential (primary) hypertension: Secondary | ICD-10-CM | POA: Diagnosis not present

## 2015-10-12 DIAGNOSIS — G20 Parkinson's disease: Secondary | ICD-10-CM | POA: Diagnosis not present

## 2015-10-12 DIAGNOSIS — G259 Extrapyramidal and movement disorder, unspecified: Secondary | ICD-10-CM | POA: Diagnosis not present

## 2015-10-12 DIAGNOSIS — E782 Mixed hyperlipidemia: Secondary | ICD-10-CM | POA: Diagnosis not present

## 2015-10-27 ENCOUNTER — Ambulatory Visit (INDEPENDENT_AMBULATORY_CARE_PROVIDER_SITE_OTHER): Payer: Medicare Other | Admitting: Podiatry

## 2015-10-27 ENCOUNTER — Encounter: Payer: Self-pay | Admitting: Podiatry

## 2015-10-27 DIAGNOSIS — Q828 Other specified congenital malformations of skin: Secondary | ICD-10-CM

## 2015-10-27 DIAGNOSIS — M79676 Pain in unspecified toe(s): Secondary | ICD-10-CM

## 2015-10-27 DIAGNOSIS — B351 Tinea unguium: Secondary | ICD-10-CM

## 2015-10-28 NOTE — Progress Notes (Signed)
She presents today with chief complaint of painful calluses and elongated toenails.  Objective: Vital signs are stable she is alert and oriented 3 pulses remain palpable no open lesions or wounds are noted. Her toenails are thick yellow dystrophic onychomycotic painful on palpation as well as debridement. Multiple calluses and particularly along the distal aspect of the toes and the medial aspect of the foot.  Assessment: Diabetes mellitus with diabetic peripheral neuropathy hammertoe deformities pain in limb secondary to onychomycosis and neuropathy. Pain in limb secondary to porokeratosis was anti-limbus.  Plan: Debridement of all reactive hyperkeratotic tissue and toenails.

## 2015-11-29 DIAGNOSIS — G20 Parkinson's disease: Secondary | ICD-10-CM | POA: Diagnosis not present

## 2015-11-29 DIAGNOSIS — G3184 Mild cognitive impairment, so stated: Secondary | ICD-10-CM | POA: Diagnosis not present

## 2016-01-03 ENCOUNTER — Ambulatory Visit (INDEPENDENT_AMBULATORY_CARE_PROVIDER_SITE_OTHER): Payer: Medicare Other | Admitting: Podiatry

## 2016-01-03 ENCOUNTER — Ambulatory Visit: Payer: Medicare Other | Admitting: Podiatry

## 2016-01-03 DIAGNOSIS — M79676 Pain in unspecified toe(s): Secondary | ICD-10-CM | POA: Diagnosis not present

## 2016-01-03 DIAGNOSIS — Q828 Other specified congenital malformations of skin: Secondary | ICD-10-CM | POA: Diagnosis not present

## 2016-01-03 DIAGNOSIS — B351 Tinea unguium: Secondary | ICD-10-CM | POA: Diagnosis not present

## 2016-01-03 NOTE — Progress Notes (Signed)
She presents today with a chief complaint of painful toenails and calluses bilateral.  Objective: Vital signs are stable she is alert and oriented 3. Pulsatile. Neurologic sensorium is intact. Deep tendon reflexes are intact. Toenails are thick yellow dystrophic with mycotic multiple porokeratotic lesions particularly distal aspect of the toes on the right foot.  Assessment: Pain in limb second onychomycosis and porokeratosis.  Plan: Debridement of toenails and porokeratotic lesions bilateral.

## 2016-01-07 DIAGNOSIS — G20 Parkinson's disease: Secondary | ICD-10-CM | POA: Diagnosis not present

## 2016-01-07 DIAGNOSIS — Z6841 Body Mass Index (BMI) 40.0 and over, adult: Secondary | ICD-10-CM | POA: Diagnosis not present

## 2016-01-07 DIAGNOSIS — F0280 Dementia in other diseases classified elsewhere without behavioral disturbance: Secondary | ICD-10-CM | POA: Diagnosis not present

## 2016-03-06 ENCOUNTER — Ambulatory Visit (INDEPENDENT_AMBULATORY_CARE_PROVIDER_SITE_OTHER): Payer: Medicare Other | Admitting: Podiatry

## 2016-03-06 DIAGNOSIS — M79676 Pain in unspecified toe(s): Secondary | ICD-10-CM

## 2016-03-06 DIAGNOSIS — M79674 Pain in right toe(s): Secondary | ICD-10-CM

## 2016-03-06 DIAGNOSIS — M79675 Pain in left toe(s): Secondary | ICD-10-CM

## 2016-03-06 DIAGNOSIS — Q828 Other specified congenital malformations of skin: Secondary | ICD-10-CM | POA: Diagnosis not present

## 2016-03-06 DIAGNOSIS — B351 Tinea unguium: Secondary | ICD-10-CM

## 2016-03-06 NOTE — Progress Notes (Signed)
She presents today. Complaint of painful elongated toenails.  Objective: Toenails along thick yellow dystrophic Lee mycotic and painful palpation.  Assessment: Pain limits her onychomycosis.  Plan: Debridement of toenails 1 through 5 bilateral debridement of all reactive hyperkeratosis bilateral. Follow up with me as needed.

## 2016-03-14 DIAGNOSIS — E119 Type 2 diabetes mellitus without complications: Secondary | ICD-10-CM | POA: Diagnosis not present

## 2016-03-14 DIAGNOSIS — E039 Hypothyroidism, unspecified: Secondary | ICD-10-CM | POA: Diagnosis not present

## 2016-03-14 DIAGNOSIS — Z0001 Encounter for general adult medical examination with abnormal findings: Secondary | ICD-10-CM | POA: Diagnosis not present

## 2016-03-14 DIAGNOSIS — E782 Mixed hyperlipidemia: Secondary | ICD-10-CM | POA: Diagnosis not present

## 2016-03-14 DIAGNOSIS — G20 Parkinson's disease: Secondary | ICD-10-CM | POA: Diagnosis not present

## 2016-03-14 DIAGNOSIS — F329 Major depressive disorder, single episode, unspecified: Secondary | ICD-10-CM | POA: Diagnosis not present

## 2016-03-14 DIAGNOSIS — K2270 Barrett's esophagus without dysplasia: Secondary | ICD-10-CM | POA: Diagnosis not present

## 2016-03-28 DIAGNOSIS — E063 Autoimmune thyroiditis: Secondary | ICD-10-CM | POA: Diagnosis not present

## 2016-03-28 DIAGNOSIS — C73 Malignant neoplasm of thyroid gland: Secondary | ICD-10-CM | POA: Diagnosis not present

## 2016-03-28 DIAGNOSIS — E1165 Type 2 diabetes mellitus with hyperglycemia: Secondary | ICD-10-CM | POA: Diagnosis not present

## 2016-03-28 DIAGNOSIS — E785 Hyperlipidemia, unspecified: Secondary | ICD-10-CM | POA: Diagnosis not present

## 2016-03-28 DIAGNOSIS — M159 Polyosteoarthritis, unspecified: Secondary | ICD-10-CM | POA: Diagnosis not present

## 2016-03-28 DIAGNOSIS — E559 Vitamin D deficiency, unspecified: Secondary | ICD-10-CM | POA: Diagnosis not present

## 2016-03-28 DIAGNOSIS — E039 Hypothyroidism, unspecified: Secondary | ICD-10-CM | POA: Diagnosis not present

## 2016-03-28 DIAGNOSIS — E118 Type 2 diabetes mellitus with unspecified complications: Secondary | ICD-10-CM | POA: Diagnosis not present

## 2016-03-28 DIAGNOSIS — E668 Other obesity: Secondary | ICD-10-CM | POA: Diagnosis not present

## 2016-04-04 DIAGNOSIS — E1165 Type 2 diabetes mellitus with hyperglycemia: Secondary | ICD-10-CM | POA: Diagnosis not present

## 2016-04-04 DIAGNOSIS — C73 Malignant neoplasm of thyroid gland: Secondary | ICD-10-CM | POA: Diagnosis not present

## 2016-04-04 DIAGNOSIS — E785 Hyperlipidemia, unspecified: Secondary | ICD-10-CM | POA: Diagnosis not present

## 2016-04-04 DIAGNOSIS — E039 Hypothyroidism, unspecified: Secondary | ICD-10-CM | POA: Diagnosis not present

## 2016-04-04 DIAGNOSIS — E063 Autoimmune thyroiditis: Secondary | ICD-10-CM | POA: Diagnosis not present

## 2016-04-04 DIAGNOSIS — E118 Type 2 diabetes mellitus with unspecified complications: Secondary | ICD-10-CM | POA: Diagnosis not present

## 2016-04-04 DIAGNOSIS — E668 Other obesity: Secondary | ICD-10-CM | POA: Diagnosis not present

## 2016-04-04 DIAGNOSIS — M159 Polyosteoarthritis, unspecified: Secondary | ICD-10-CM | POA: Diagnosis not present

## 2016-05-08 DIAGNOSIS — Z6841 Body Mass Index (BMI) 40.0 and over, adult: Secondary | ICD-10-CM | POA: Diagnosis not present

## 2016-05-08 DIAGNOSIS — Z8659 Personal history of other mental and behavioral disorders: Secondary | ICD-10-CM | POA: Diagnosis not present

## 2016-05-08 DIAGNOSIS — G20 Parkinson's disease: Secondary | ICD-10-CM | POA: Diagnosis not present

## 2016-05-08 DIAGNOSIS — F411 Generalized anxiety disorder: Secondary | ICD-10-CM | POA: Diagnosis not present

## 2016-05-08 DIAGNOSIS — F0280 Dementia in other diseases classified elsewhere without behavioral disturbance: Secondary | ICD-10-CM | POA: Diagnosis not present

## 2016-05-17 ENCOUNTER — Encounter: Payer: Self-pay | Admitting: Podiatry

## 2016-05-17 ENCOUNTER — Ambulatory Visit (INDEPENDENT_AMBULATORY_CARE_PROVIDER_SITE_OTHER): Payer: Medicare Other | Admitting: Podiatry

## 2016-05-17 DIAGNOSIS — Q828 Other specified congenital malformations of skin: Secondary | ICD-10-CM | POA: Diagnosis not present

## 2016-05-17 DIAGNOSIS — B351 Tinea unguium: Secondary | ICD-10-CM | POA: Diagnosis not present

## 2016-05-17 DIAGNOSIS — E1142 Type 2 diabetes mellitus with diabetic polyneuropathy: Secondary | ICD-10-CM

## 2016-05-17 DIAGNOSIS — M79676 Pain in unspecified toe(s): Secondary | ICD-10-CM | POA: Diagnosis not present

## 2016-05-17 NOTE — Progress Notes (Signed)
She has a chief complaint of painful elongated toenails and multiple calluses.  Objective: Pulses are palpable bilateral hammertoe deformities resulting in a distal clavus second digit right foot exquisitely painful on palpation. Otherwise toenails are long patellar dystrophic with mycotic no open lesions or wounds are noted.  Assessment: Pain in limb secondary to onychomycosis and porokeratosis distal clavus second digit right foot with hammertoe area  Plan: Debridement of toenails 1 through 5 bilateral debridement reactive hyperkeratosis. Follow up with her

## 2016-06-16 DIAGNOSIS — E119 Type 2 diabetes mellitus without complications: Secondary | ICD-10-CM | POA: Diagnosis not present

## 2016-07-19 DIAGNOSIS — K5289 Other specified noninfective gastroenteritis and colitis: Secondary | ICD-10-CM | POA: Diagnosis not present

## 2016-07-19 DIAGNOSIS — G20 Parkinson's disease: Secondary | ICD-10-CM | POA: Diagnosis not present

## 2016-07-19 DIAGNOSIS — I10 Essential (primary) hypertension: Secondary | ICD-10-CM | POA: Diagnosis not present

## 2016-07-19 DIAGNOSIS — F411 Generalized anxiety disorder: Secondary | ICD-10-CM | POA: Diagnosis not present

## 2016-07-19 DIAGNOSIS — E1165 Type 2 diabetes mellitus with hyperglycemia: Secondary | ICD-10-CM | POA: Diagnosis not present

## 2016-08-16 ENCOUNTER — Ambulatory Visit: Payer: Medicare Other | Admitting: Podiatry

## 2016-08-28 ENCOUNTER — Ambulatory Visit (INDEPENDENT_AMBULATORY_CARE_PROVIDER_SITE_OTHER): Payer: Medicare Other | Admitting: Podiatry

## 2016-08-28 ENCOUNTER — Encounter: Payer: Self-pay | Admitting: Podiatry

## 2016-08-28 DIAGNOSIS — M79676 Pain in unspecified toe(s): Secondary | ICD-10-CM

## 2016-08-28 DIAGNOSIS — B351 Tinea unguium: Secondary | ICD-10-CM | POA: Diagnosis not present

## 2016-08-28 DIAGNOSIS — E1142 Type 2 diabetes mellitus with diabetic polyneuropathy: Secondary | ICD-10-CM | POA: Diagnosis not present

## 2016-08-28 DIAGNOSIS — Q828 Other specified congenital malformations of skin: Secondary | ICD-10-CM

## 2016-08-28 NOTE — Progress Notes (Signed)
She presents today chief complaint of painful elongated toenails and calluses bilateral.  Objective: Tenderness along thick yellow dystrophic onychomycotic painful palpation as well as debridement. Multiple porokeratosis and tyloma splinter aspect of the bilateral foot and distal toes no open lesions or wounds are noted and pulses continue to be palpable.  Assessment: Pain in limb sigmoid onychomycosis porokeratosis.  Plan: Debridement of all reactive hyperkeratotic tissue debridement of painful mycotic nails 1 through 5 bilateral.

## 2016-08-29 DIAGNOSIS — G2581 Restless legs syndrome: Secondary | ICD-10-CM | POA: Diagnosis not present

## 2016-08-29 DIAGNOSIS — Z8659 Personal history of other mental and behavioral disorders: Secondary | ICD-10-CM | POA: Diagnosis not present

## 2016-08-29 DIAGNOSIS — G20 Parkinson's disease: Secondary | ICD-10-CM | POA: Diagnosis not present

## 2016-08-29 DIAGNOSIS — F411 Generalized anxiety disorder: Secondary | ICD-10-CM | POA: Diagnosis not present

## 2016-08-29 DIAGNOSIS — Z6841 Body Mass Index (BMI) 40.0 and over, adult: Secondary | ICD-10-CM | POA: Diagnosis not present

## 2016-08-29 DIAGNOSIS — F0280 Dementia in other diseases classified elsewhere without behavioral disturbance: Secondary | ICD-10-CM | POA: Diagnosis not present

## 2016-09-01 DIAGNOSIS — G2581 Restless legs syndrome: Secondary | ICD-10-CM | POA: Insufficient documentation

## 2016-09-01 DIAGNOSIS — Z8659 Personal history of other mental and behavioral disorders: Secondary | ICD-10-CM | POA: Insufficient documentation

## 2016-09-01 DIAGNOSIS — F411 Generalized anxiety disorder: Secondary | ICD-10-CM | POA: Insufficient documentation

## 2016-09-01 DIAGNOSIS — Z6841 Body Mass Index (BMI) 40.0 and over, adult: Secondary | ICD-10-CM

## 2016-09-01 DIAGNOSIS — G20 Parkinson's disease: Secondary | ICD-10-CM

## 2016-09-01 DIAGNOSIS — F0280 Dementia in other diseases classified elsewhere without behavioral disturbance: Secondary | ICD-10-CM | POA: Insufficient documentation

## 2016-10-11 DIAGNOSIS — E1165 Type 2 diabetes mellitus with hyperglycemia: Secondary | ICD-10-CM | POA: Diagnosis not present

## 2016-10-18 DIAGNOSIS — E039 Hypothyroidism, unspecified: Secondary | ICD-10-CM | POA: Diagnosis not present

## 2016-10-18 DIAGNOSIS — E063 Autoimmune thyroiditis: Secondary | ICD-10-CM | POA: Diagnosis not present

## 2016-10-18 DIAGNOSIS — E118 Type 2 diabetes mellitus with unspecified complications: Secondary | ICD-10-CM | POA: Diagnosis not present

## 2016-10-18 DIAGNOSIS — E785 Hyperlipidemia, unspecified: Secondary | ICD-10-CM | POA: Diagnosis not present

## 2016-10-18 DIAGNOSIS — E668 Other obesity: Secondary | ICD-10-CM | POA: Diagnosis not present

## 2016-10-18 DIAGNOSIS — E1165 Type 2 diabetes mellitus with hyperglycemia: Secondary | ICD-10-CM | POA: Diagnosis not present

## 2016-10-18 DIAGNOSIS — M159 Polyosteoarthritis, unspecified: Secondary | ICD-10-CM | POA: Diagnosis not present

## 2016-10-18 DIAGNOSIS — C73 Malignant neoplasm of thyroid gland: Secondary | ICD-10-CM | POA: Diagnosis not present

## 2016-11-17 DIAGNOSIS — Z23 Encounter for immunization: Secondary | ICD-10-CM | POA: Diagnosis not present

## 2016-11-21 DIAGNOSIS — G20 Parkinson's disease: Secondary | ICD-10-CM | POA: Diagnosis not present

## 2016-11-21 DIAGNOSIS — E1165 Type 2 diabetes mellitus with hyperglycemia: Secondary | ICD-10-CM | POA: Diagnosis not present

## 2016-11-21 DIAGNOSIS — I10 Essential (primary) hypertension: Secondary | ICD-10-CM | POA: Diagnosis not present

## 2016-11-21 DIAGNOSIS — F411 Generalized anxiety disorder: Secondary | ICD-10-CM | POA: Diagnosis not present

## 2016-11-21 DIAGNOSIS — R110 Nausea: Secondary | ICD-10-CM | POA: Diagnosis not present

## 2016-11-29 ENCOUNTER — Encounter: Payer: Self-pay | Admitting: Podiatry

## 2016-11-29 ENCOUNTER — Ambulatory Visit (INDEPENDENT_AMBULATORY_CARE_PROVIDER_SITE_OTHER): Payer: Medicare Other | Admitting: Podiatry

## 2016-11-29 DIAGNOSIS — M79676 Pain in unspecified toe(s): Secondary | ICD-10-CM | POA: Diagnosis not present

## 2016-11-29 DIAGNOSIS — E1142 Type 2 diabetes mellitus with diabetic polyneuropathy: Secondary | ICD-10-CM

## 2016-11-29 DIAGNOSIS — Q828 Other specified congenital malformations of skin: Secondary | ICD-10-CM | POA: Diagnosis not present

## 2016-11-29 DIAGNOSIS — B351 Tinea unguium: Secondary | ICD-10-CM

## 2016-11-29 NOTE — Progress Notes (Signed)
She presents today for follow-up of her diabetic peripheral neuropathy as well as hammertoe deformities calluses and ankle corns and toenails.  Objective: Pulses remain palpable. No change in neurovascular status. Hammertoes are present. Distal clavi or noted multiple porokeratotic lesions plantar aspect bilateral foot across the metatarsal heads. She also has thick yellow dystrophic mycotic nails. Painful on palpation as well as debridement.  Assessment: Diabetes mellitus diabetic peripheral neuropathy with hammertoe deformities distal clavi porokeratosis plantar forefoot and painful discolored thickened nails.  Plan: Debridement of toenails 1 through 5 bilateral was covered service secondary to pain debridement of all reactive hyperkeratosis.

## 2016-12-19 DIAGNOSIS — G479 Sleep disorder, unspecified: Secondary | ICD-10-CM | POA: Diagnosis not present

## 2016-12-19 DIAGNOSIS — G20 Parkinson's disease: Secondary | ICD-10-CM | POA: Diagnosis not present

## 2016-12-19 DIAGNOSIS — G2581 Restless legs syndrome: Secondary | ICD-10-CM | POA: Diagnosis not present

## 2016-12-19 DIAGNOSIS — F411 Generalized anxiety disorder: Secondary | ICD-10-CM | POA: Diagnosis not present

## 2016-12-19 DIAGNOSIS — F0280 Dementia in other diseases classified elsewhere without behavioral disturbance: Secondary | ICD-10-CM | POA: Diagnosis not present

## 2016-12-19 DIAGNOSIS — Z8659 Personal history of other mental and behavioral disorders: Secondary | ICD-10-CM | POA: Diagnosis not present

## 2017-01-08 ENCOUNTER — Encounter: Payer: Self-pay | Admitting: Nurse Practitioner

## 2017-01-08 ENCOUNTER — Ambulatory Visit (INDEPENDENT_AMBULATORY_CARE_PROVIDER_SITE_OTHER): Payer: Medicare Other | Admitting: Nurse Practitioner

## 2017-01-08 VITALS — BP 130/80 | HR 71 | Resp 16 | Ht 64.0 in | Wt 221.0 lb

## 2017-01-08 DIAGNOSIS — E785 Hyperlipidemia, unspecified: Secondary | ICD-10-CM | POA: Insufficient documentation

## 2017-01-08 DIAGNOSIS — F329 Major depressive disorder, single episode, unspecified: Secondary | ICD-10-CM | POA: Insufficient documentation

## 2017-01-08 DIAGNOSIS — E039 Hypothyroidism, unspecified: Secondary | ICD-10-CM | POA: Diagnosis not present

## 2017-01-08 DIAGNOSIS — E119 Type 2 diabetes mellitus without complications: Secondary | ICD-10-CM | POA: Insufficient documentation

## 2017-01-08 DIAGNOSIS — D649 Anemia, unspecified: Secondary | ICD-10-CM | POA: Diagnosis not present

## 2017-01-08 DIAGNOSIS — R531 Weakness: Secondary | ICD-10-CM

## 2017-01-08 DIAGNOSIS — H269 Unspecified cataract: Secondary | ICD-10-CM | POA: Insufficient documentation

## 2017-01-08 DIAGNOSIS — F0280 Dementia in other diseases classified elsewhere without behavioral disturbance: Secondary | ICD-10-CM

## 2017-01-08 DIAGNOSIS — G20A1 Parkinson's disease without dyskinesia, without mention of fluctuations: Secondary | ICD-10-CM | POA: Insufficient documentation

## 2017-01-08 DIAGNOSIS — F32A Depression, unspecified: Secondary | ICD-10-CM | POA: Insufficient documentation

## 2017-01-08 DIAGNOSIS — N390 Urinary tract infection, site not specified: Secondary | ICD-10-CM | POA: Diagnosis not present

## 2017-01-08 DIAGNOSIS — B372 Candidiasis of skin and nail: Secondary | ICD-10-CM

## 2017-01-08 DIAGNOSIS — I10 Essential (primary) hypertension: Secondary | ICD-10-CM | POA: Insufficient documentation

## 2017-01-08 DIAGNOSIS — G20 Parkinson's disease: Secondary | ICD-10-CM | POA: Diagnosis not present

## 2017-01-08 DIAGNOSIS — B019 Varicella without complication: Secondary | ICD-10-CM | POA: Insufficient documentation

## 2017-01-08 DIAGNOSIS — G43909 Migraine, unspecified, not intractable, without status migrainosus: Secondary | ICD-10-CM | POA: Insufficient documentation

## 2017-01-08 DIAGNOSIS — F419 Anxiety disorder, unspecified: Secondary | ICD-10-CM | POA: Insufficient documentation

## 2017-01-08 DIAGNOSIS — B029 Zoster without complications: Secondary | ICD-10-CM | POA: Insufficient documentation

## 2017-01-08 DIAGNOSIS — M1990 Unspecified osteoarthritis, unspecified site: Secondary | ICD-10-CM | POA: Insufficient documentation

## 2017-01-08 DIAGNOSIS — E079 Disorder of thyroid, unspecified: Secondary | ICD-10-CM | POA: Insufficient documentation

## 2017-01-08 MED ORDER — CLOTRIMAZOLE-BETAMETHASONE 1-0.05 % EX CREA: 1.0000 "application " | TOPICAL_CREAM | Freq: Two times a day (BID) | CUTANEOUS | 0 refills | Status: DC

## 2017-01-08 MED ORDER — CIPROFLOXACIN HCL 500 MG PO TABS: 500.0000 mg | ORAL_TABLET | Freq: Two times a day (BID) | ORAL | 0 refills | Status: DC

## 2017-01-08 NOTE — Progress Notes (Signed)
Subjective:     Patient ID: Theresa Mills, female   DOB: [birth year], 66 y.o.   MRN: [id number]  Patient here c/o nausea, decreased appetite, worsening weakness. Did see Parkinson's doctor, who believes some of these symptoms may be related to medications. They are currently adjusting dosages and times. She has been given a medication to help nausea. Still has no energy and feels very weak.  The patient is here with friend/cregiver. States that she has been urinating more often, especially at night. The urine is dark in color and strong odor was noted.    Very red and irritated appearing rash along chest and left breast .has been putting lotion on it but not helping.      Review of Systems  Constitutional: Positive for appetite change and fatigue.  HENT: Negative.   Eyes: Negative.   Respiratory: Positive for shortness of breath.   Cardiovascular: Negative.   Gastrointestinal: Positive for nausea.  Endocrine:       History of thyroid disorder, but sees endocrinology  Genitourinary: Positive for dysuria and frequency.       Steong odor in urine.   Skin: Positive for rash.       Very red and inflamed rash in center of chest and around the left breast.   Allergic/Immunologic: Negative.   Neurological: Positive for tremors and weakness.  Hematological: Negative.   Psychiatric/Behavioral: Negative.        Objective:   Physical Exam  Constitutional: She is oriented to person, place, and time. She appears well-developed and well-nourished.  HENT:  Head: Normocephalic and atraumatic.  Neck: Normal range of motion. Neck supple.  Cardiovascular: Normal rate and regular rhythm.  Pulmonary/Chest: Effort normal and breath sounds normal.  Abdominal: Soft. Normal appearance. Bowel sounds are increased. There is no hepatosplenomegaly. There is tenderness in the right lower quadrant. There is rebound. There is no CVA tenderness and no tenderness at McBurney's point.  Genitourinary:    Genitourinary Comments: The patient was unable to provide a urine dample at today's visit  Musculoskeletal:  Mild generalized musculoskeletal weakness present. Using her walker to hselp with balance and mobility  Neurological: She is alert and oriented to person, place, and time. She displays tremor. Gait abnormal.  Skin: Skin is warm and dry. Rash noted. Rash is urticarial.     vwery red and inflamed rash in center of chest and underneath both breasts. Skin intact with no drainage present.   Psychiatric: She exhibits a depressed mood.       Assessment:     Urinary tract infection without hematuria, site unspecified - Plan: ciprofloxacin (CIPRO) 500 MG tablet, Urinalysis, Routine w reflex microscopic  Fatigue associated with anemia - Plan: CBC with Differential/Platelet, Ferritin, Vitamin B12  Weakness generalized - Plan: Comprehensive metabolic panel  Hypothyroidism, unspecified type - Plan: TSH, T4, free  Candidal skin infection - Plan: clotrimazole-betamethasone (LOTRISONE) cream  Dementia due to Parkinson's disease without behavioral disturbance ([place]), Chronic     Plan:       1. uti - suspected. Start cipro 500mg  po BID for 7 days. Send for u/a with micro reflex and adjust as indicated.  2. Fatigue - check anemia panel for further evaluation. Medication adjustments per neurology. 3. Weakness - check CMP. Possibly related to meds for parkinson's. Continue with adjustments made, recently, per neuroogy 4 . Hypothyroid - check TSH and free T4. Will send results to endocrinology 5. Dementia due to parkinson's disease - continue medications per neurology and regular follow  ups as scheduled.  Follow up 2 weeks to review labs and revaluate symptoms.

## 2017-01-09 DIAGNOSIS — R531 Weakness: Secondary | ICD-10-CM | POA: Diagnosis not present

## 2017-01-09 DIAGNOSIS — E039 Hypothyroidism, unspecified: Secondary | ICD-10-CM | POA: Diagnosis not present

## 2017-01-09 DIAGNOSIS — E538 Deficiency of other specified B group vitamins: Secondary | ICD-10-CM | POA: Diagnosis not present

## 2017-01-09 DIAGNOSIS — D649 Anemia, unspecified: Secondary | ICD-10-CM | POA: Diagnosis not present

## 2017-01-10 ENCOUNTER — Other Ambulatory Visit: Payer: Self-pay | Admitting: Nurse Practitioner

## 2017-01-10 DIAGNOSIS — E039 Hypothyroidism, unspecified: Secondary | ICD-10-CM

## 2017-01-10 LAB — COMPREHENSIVE METABOLIC PANEL
ALK PHOS: 56 IU/L (ref 39–117)
ALT: 12 IU/L (ref 0–32)
AST: 16 IU/L (ref 0–40)
Albumin/Globulin Ratio: 1.6 (ref 1.2–2.2)
Albumin: 4.2 g/dL (ref 3.6–4.8)
BILIRUBIN TOTAL: 0.6 mg/dL (ref 0.0–1.2)
BUN/Creatinine Ratio: 29 — ABNORMAL HIGH (ref 12–28)
BUN: 19 mg/dL (ref 8–27)
CHLORIDE: 108 mmol/L — AB (ref 96–106)
CO2: 24 mmol/L (ref 20–29)
CREATININE: 0.65 mg/dL (ref 0.57–1.00)
Calcium: 9.5 mg/dL (ref 8.7–10.3)
GFR calc Af Amer: 106 mL/min/{1.73_m2} (ref >59)
GFR calc non Af Amer: 92 mL/min/{1.73_m2} (ref >59)
GLOBULIN, TOTAL: 2.6 g/dL (ref 1.5–4.5)
Glucose: 108 mg/dL — ABNORMAL HIGH (ref 65–99)
POTASSIUM: 4.6 mmol/L (ref 3.5–5.2)
SODIUM: 149 mmol/L — AB (ref 134–144)
Total Protein: 6.8 g/dL (ref 6.0–8.5)

## 2017-01-10 LAB — CBC WITH DIFFERENTIAL/PLATELET
BASOS ABS: 0 10*3/uL (ref 0.0–0.2)
Basos: 0 %
EOS (ABSOLUTE): 0.1 10*3/uL (ref 0.0–0.4)
EOS: 1 %
HEMATOCRIT: 49.8 % — AB (ref 34.0–46.6)
Hemoglobin: 15.9 g/dL (ref 11.1–15.9)
Immature Grans (Abs): 0 10*3/uL (ref 0.0–0.1)
Immature Granulocytes: 0 %
LYMPHS ABS: 1.6 10*3/uL (ref 0.7–3.1)
Lymphs: 19 %
MCH: 30.8 pg (ref 26.6–33.0)
MCHC: 31.9 g/dL (ref 31.5–35.7)
MCV: 97 fL (ref 79–97)
MONOS ABS: 0.5 10*3/uL (ref 0.1–0.9)
Monocytes: 6 %
Neutrophils Absolute: 5.9 10*3/uL (ref 1.4–7.0)
Neutrophils: 74 %
Platelets: 237 10*3/uL (ref 150–379)
RBC: 5.16 x10E6/uL (ref 3.77–5.28)
RDW: 13.5 % (ref 12.3–15.4)
WBC: 8.1 10*3/uL (ref 3.4–10.8)

## 2017-01-10 LAB — T4, FREE: Free T4: 2.14 ng/dL — ABNORMAL HIGH (ref 0.82–1.77)

## 2017-01-10 LAB — TSH: TSH: 0.237 u[IU]/mL — ABNORMAL LOW (ref 0.450–4.500)

## 2017-01-10 LAB — FERRITIN: Ferritin: 94 ng/mL (ref 15–150)

## 2017-01-10 LAB — VITAMIN B12: VITAMIN B 12: 625 pg/mL (ref 232–1245)

## 2017-01-10 MED ORDER — LEVOTHYROXINE SODIUM 175 MCG PO TABS: 175.0000 ug | ORAL_TABLET | Freq: Every day | ORAL | 2 refills | Status: DC

## 2017-01-10 NOTE — Progress Notes (Signed)
Recent labs indicate that synthroid dosing too high. Reduced synthroid to 1107mcg daily, down from 272mcg. Recheck levels in 2 months

## 2017-01-11 LAB — URINALYSIS, ROUTINE W REFLEX MICROSCOPIC
BILIRUBIN UA: NEGATIVE
Nitrite, UA: NEGATIVE
PH UA: 5.5 (ref 5.0–7.5)
PROTEIN UA: NEGATIVE
RBC UA: NEGATIVE
Urobilinogen, Ur: 0.2 mg/dL (ref 0.2–1.0)

## 2017-01-11 LAB — MICROSCOPIC EXAMINATION: Casts: NONE SEEN /lpf

## 2017-01-12 NOTE — Progress Notes (Signed)
Pt advised for labs /np

## 2017-01-22 ENCOUNTER — Ambulatory Visit: Payer: Self-pay | Admitting: Nurse Practitioner

## 2017-01-29 ENCOUNTER — Other Ambulatory Visit: Payer: Self-pay | Admitting: Internal Medicine

## 2017-02-20 ENCOUNTER — Ambulatory Visit: Payer: Self-pay | Admitting: Nurse Practitioner

## 2017-02-26 ENCOUNTER — Other Ambulatory Visit: Payer: Self-pay | Admitting: Nurse Practitioner

## 2017-02-26 DIAGNOSIS — F411 Generalized anxiety disorder: Secondary | ICD-10-CM

## 2017-02-26 MED ORDER — ALPRAZOLAM 0.5 MG PO TABS: ORAL_TABLET | ORAL | 2 refills | Status: DC

## 2017-03-07 ENCOUNTER — Ambulatory Visit: Payer: Medicare Other | Admitting: Podiatry

## 2017-03-12 ENCOUNTER — Ambulatory Visit (INDEPENDENT_AMBULATORY_CARE_PROVIDER_SITE_OTHER): Payer: Medicare Other | Admitting: Internal Medicine

## 2017-03-12 ENCOUNTER — Encounter: Payer: Self-pay | Admitting: Internal Medicine

## 2017-03-12 VITALS — BP 118/64 | HR 73 | Resp 16 | Ht 64.0 in | Wt 215.0 lb

## 2017-03-12 DIAGNOSIS — J4520 Mild intermittent asthma, uncomplicated: Secondary | ICD-10-CM | POA: Diagnosis not present

## 2017-03-12 DIAGNOSIS — G4733 Obstructive sleep apnea (adult) (pediatric): Secondary | ICD-10-CM | POA: Diagnosis not present

## 2017-03-12 NOTE — Progress Notes (Signed)
[facility] [place], [place] [address]  Pulmonary Sleep Medicine  Office Visit Note  Patient Name: Theresa Mills DOB: 11/13/1950 MRN [id number]  Date of Service: 03/12/2017  Complaints/HPI: Sheis doing well. She has not been able to tolerate the CPAP device. Patient is on oxygen instead of the CPAP. Patient has no cough noted no distress noted at this time. No feverno admisssions  ROS  General: (-) fever, (-) chills, (-) night sweats, (-) weakness Skin: (-) rashes, (-) itching,. Eyes: (-) visual changes, (-) redness, (-) itching. Nose and Sinuses: (-) nasal stuffiness or itchiness, (-) postnasal drip, (-) nosebleeds, (-) sinus trouble. Mouth and Throat: (-) sore throat, (-) hoarseness. Neck: (-) swollen glands, (-) enlarged thyroid, (-) neck pain. Respiratory: - cough, (-) bloody sputum, + shortness of breath, - wheezing. Cardiovascular: + ankle swelling, (-) chest pain. Lymphatic: (-) lymph node enlargement. Neurologic: (-) numbness, (-) tingling. Psychiatric: (-) anxiety, (-) depression   Current Medication: Outpatient Encounter Medications as of 03/12/2017  Medication Sig Note  . ALPRAZolam (XANAX) 0.5 MG tablet Take 1/2 tablet QAM and 1 tablet po QHS prn   . Calcium Carbonate-Vitamin D (CALCIUM HIGH POTENCY/VITAMIN D) 600-200 MG-UNIT TABS Take by mouth.   . carbidopa-levodopa (SINEMET CR) 50-200 MG per tablet daily. 11/20/2012: Received from: External Pharmacy Received Sig:   . carbidopa-levodopa (SINEMET IR) 25-250 MG tablet    . celecoxib (CELEBREX) 200 MG capsule Take 200 mg by mouth as needed for pain.   . citalopram (CELEXA) 40 MG tablet    . donepezil (ARICEPT) 10 MG tablet    . glucose blood (ONETOUCH VERIO) test strip TEST BLOOD SUGAR BID   . INVOKANA 300 MG TABS daily. 11/20/2012: Received from: External Pharmacy Received Sig:   . levothyroxine (SYNTHROID, LEVOTHROID) 175 MCG tablet Take 1 tablet (175 mcg total) by mouth daily.   .  metFORMIN (GLUCOPHAGE) 500 MG tablet    . Multiple Vitamins-Minerals (MULTIVITAMIN WOMEN 50+ PO) Take by mouth.   Marland Kitchen omeprazole (PRILOSEC) 40 MG capsule daily. 11/20/2012: Received from: External Pharmacy Received Sig:   . ondansetron (ZOFRAN) 4 MG tablet Take by mouth.   Glory Rosebush DELICA LANCETS 27P MISC TEST BLOOD SUGAR BID   . rOPINIRole (REQUIP XL) 2 MG 24 hr tablet Take by mouth.   . sertraline (ZOLOFT) 25 MG tablet    . simvastatin (ZOCOR) 40 MG tablet TAKE 1 TABLET BY MOUTH AT BEDTIME   . [DISCONTINUED] ONETOUCH DELICA LANCETS 82U MISC daily. 11/20/2012: Received from: External Pharmacy Received Sig:   . carbidopa-levodopa (SINEMET CR) 50-200 MG tablet Take by mouth.   . carvedilol (COREG) 6.25 MG tablet daily. 11/20/2012: Received from: External Pharmacy Received Sig:   . [DISCONTINUED] ciprofloxacin (CIPRO) 500 MG tablet Take 1 tablet (500 mg total) by mouth 2 (two) times daily. (Patient not taking: Reported on 03/12/2017)   . [DISCONTINUED] clotrimazole-betamethasone (LOTRISONE) cream Apply 1 application topically 2 (two) times daily. (Patient not taking: Reported on 03/12/2017)   . [DISCONTINUED] rOPINIRole (REQUIP XL) 2 MG 24 hr tablet     No facility-administered encounter medications on file as of 03/12/2017.     Surgical History: Past Surgical History:  Procedure Laterality Date  . ABDOMINAL HYSTERECTOMY    . CATARACT EXTRACTION Bilateral   . CHOLECYSTECTOMY    . COLONOSCOPY WITH PROPOFOL N/A 04/02/2015   Procedure: COLONOSCOPY WITH PROPOFOL;  Surgeon: Josefine Class, MD;  Location: [facility] ENDOSCOPY;  Service: Endoscopy;  Laterality: N/A;  . ESOPHAGOGASTRODUODENOSCOPY (EGD)  WITH PROPOFOL N/A 04/02/2015   Procedure: ESOPHAGOGASTRODUODENOSCOPY (EGD) WITH PROPOFOL;  Surgeon: Josefine Class, MD;  Location[facility] ENDOSCOPY;  Service: Endoscopy;  Laterality: N/A;  . GALLBLADDER SURGERY    . LAPAROSCOPIC HYSTERECTOMY    . THYROIDECTOMY Left   . TONSILLECTOMY    .  TONSILLECTOMY Bilateral     Medical History: Past Medical History:  Diagnosis Date  . Anxiety   . Diabetes ([place])   . Difficult intravenous access   . GERD (gastroesophageal reflux disease)   . HBP (high blood pressure)   . Hyperlipidemia   . Osteoarthritis   . Parkinson disease ([place])   . Sleep apnea   . Thyroid disease     Family History: Family History  Problem Relation Age of Onset  . Asthma Father   . Cancer Sister   . Stroke Maternal Uncle   . Heart disease Maternal Uncle   . Diabetes Maternal Uncle     Social History: Social History   Socioeconomic History  . Marital status: Widowed    Spouse name: Not on file  . Number of children: Not on file  . Years of education: Not on file  . Highest education level: Not on file  Social Needs  . Financial resource strain: Not on file  . Food insecurity - worry: Not on file  . Food insecurity - inability: Not on file  . Transportation needs - medical: Not on file  . Transportation needs - non-medical: Not on file  Occupational History  . Not on file  Tobacco Use  . Smoking status: Never Smoker  . Smokeless tobacco: Never Used  Substance and Sexual Activity  . Alcohol use: No    Comment: OCCASIONALLY  . Drug use: No  . Sexual activity: Not on file  Other Topics Concern  . Not on file  Social History Narrative  . Not on file    Vital Signs: Blood pressure 118/64, pulse 73, resp. rate 16, height 5\' 4"  (1.626 m), weight 215 lb (97.5 kg), SpO2 99 %.  Examination: General Appearance: The patient is well-developed, well-nourished, and in no distress. Skin: Gross inspection of skin unremarkable. Head: normocephalic, no gross deformities. Eyes: no gross deformities noted. ENT: ears appear grossly normal no exudates. Neck: Supple. No thyromegaly. No LAD. Respiratory: scattered rhonchi noted. Cardiovascular: Normal S1 and S2 without murmur or rub. Extremities: No cyanosis. pulses are equal. Neurologic: Alert and  oriented. No involuntary movements.  LABS: Recent Results (from the past 2160 hour(s))  CBC with Differential/Platelet     Status: Abnormal   Collection Time: 01/09/17 10:41 AM  Result Value Ref Range   WBC 8.1 3.4 - 10.8 x10E3/uL   RBC 5.16 3.77 - 5.28 x10E6/uL   Hemoglobin 15.9 11.1 - 15.9 g/dL   Hematocrit 49.8 (H) 34.0 - 46.6 %   MCV 97 79 - 97 fL   MCH 30.8 26.6 - 33.0 pg   MCHC 31.9 31.5 - 35.7 g/dL   RDW 13.5 12.3 - 15.4 %   Platelets 237 150 - 379 x10E3/uL   Neutrophils 74 Not Estab. %   Lymphs 19 Not Estab. %   Monocytes 6 Not Estab. %   Eos 1 Not Estab. %   Basos 0 Not Estab. %   Neutrophils Absolute 5.9 1.4 - 7.0 x10E3/uL   Lymphocytes Absolute 1.6 0.7 - 3.1 x10E3/uL   Monocytes Absolute 0.5 0.1 - 0.9 x10E3/uL   EOS (ABSOLUTE) 0.1 0.0 - 0.4 x10E3/uL   Basophils Absolute 0.0 0.0 -  0.2 x10E3/uL   Immature Granulocytes 0 Not Estab. %   Immature Grans (Abs) 0.0 0.0 - 0.1 x10E3/uL  Comprehensive metabolic panel     Status: Abnormal   Collection Time: 01/09/17 10:41 AM  Result Value Ref Range   Glucose 108 (H) 65 - 99 mg/dL   BUN 19 8 - 27 mg/dL   Creatinine, Ser 0.65 0.57 - 1.00 mg/dL   GFR calc non Af Amer 92 >59 mL/min/1.73   GFR calc Af Amer 106 >59 mL/min/1.73   BUN/Creatinine Ratio 29 (H) 12 - 28   Sodium 149 (H) 134 - 144 mmol/L   Potassium 4.6 3.5 - 5.2 mmol/L   Chloride 108 (H) 96 - 106 mmol/L   CO2 24 20 - 29 mmol/L   Calcium 9.5 8.7 - 10.3 mg/dL   Total Protein 6.8 6.0 - 8.5 g/dL   Albumin 4.2 3.6 - 4.8 g/dL   Globulin, Total 2.6 1.5 - 4.5 g/dL   Albumin/Globulin Ratio 1.6 1.2 - 2.2   Bilirubin Total 0.6 0.0 - 1.2 mg/dL   Alkaline Phosphatase 56 39 - 117 IU/L   AST 16 0 - 40 IU/L   ALT 12 0 - 32 IU/L  Ferritin     Status: None   Collection Time: 01/09/17 10:41 AM  Result Value Ref Range   Ferritin 94 15 - 150 ng/mL  Vitamin B12     Status: None   Collection Time: 01/09/17 10:41 AM  Result Value Ref Range   Vitamin B-12 625 232 - 1,245 pg/mL   TSH     Status: Abnormal   Collection Time: 01/09/17 10:41 AM  Result Value Ref Range   TSH 0.237 (L) 0.450 - 4.500 uIU/mL  T4, free     Status: Abnormal   Collection Time: 01/09/17 10:41 AM  Result Value Ref Range   Free T4 2.14 (H) 0.82 - 1.77 ng/dL  Urinalysis, Routine w reflex microscopic     Status: Abnormal   Collection Time: 01/11/17  9:39 AM  Result Value Ref Range   Specific Gravity, UA      >=1.030 (A) 1.005 - 1.030   pH, UA 5.5 5.0 - 7.5   Color, UA Yellow Yellow   Appearance Ur Cloudy (A) Clear   Leukocytes, UA 1+ (A) Negative   Protein, UA Negative Negative/Trace   Glucose, UA 3+ (A) Negative   Ketones, UA Trace (A) Negative   RBC, UA Negative Negative   Bilirubin, UA Negative Negative   Urobilinogen, Ur 0.2 0.2 - 1.0 mg/dL   Nitrite, UA Negative Negative   Microscopic Examination See below:     Comment: Microscopic was indicated and was performed.  Microscopic Examination     Status: Abnormal   Collection Time: 01/11/17  9:39 AM  Result Value Ref Range   WBC, UA 11-30 (A) 0 - 5 /hpf   RBC, UA 3-10 (A) 0 - 2 /hpf   Epithelial Cells (non renal) 0-10 0 - 10 /hpf   Casts None seen None seen /lpf   Crystals Present (A) N/A   Crystal Type Calcium Oxalate N/A   Mucus, UA Present Not Estab.   Bacteria, UA Few None seen/Few   Yeast, UA Present (A) None seen    Radiology: No results found.  No results found.  No results found.    Assessment and Plan: Patient Active Problem List   Diagnosis Date Noted  . Arthritis 01/08/2017  . Cataracts, bilateral 01/08/2017  . Chickenpox 01/08/2017  . Shingles 01/08/2017  .  Depression 01/08/2017  . Diabetes mellitus type 2, uncomplicated ([place]) 04/59/9774  . Hyperlipidemia, unspecified 01/08/2017  . Hypertension 01/08/2017  . Migraine headache 01/08/2017  . Thyroid disease 01/08/2017  . Parkinson disease ([place]) 01/08/2017  . Anxiety, generalized 09/01/2016  . Dementia due to Parkinson's disease without behavioral  disturbance ([place]) 09/01/2016  . History of depression 09/01/2016  . Morbid obesity with BMI of 40.0-44.9, adult ([place]) 09/01/2016  . RLS (restless legs syndrome) 09/01/2016    1. OSA she is not able to tolerate PAP on home oxygen which she is happy with 2. Asthma has nothad follow up PFT will check. She is not on any inhalers at this time 3. Obesity she is not able to lose weight  General Counseling: I have discussed the findings of the evaluation and examination with Theresa Mills.  I have also discussed any further diagnostic evaluation thatmay be needed or ordered today. Theresa Mills verbalizes understanding of the findings of todays visit. We also reviewed her medications today and discussed drug interactions and side effects including but not limited excessive drowsiness and altered mental states. We also discussed that there is always a risk not just to her but also people around her. she has been encouraged to call the office with any questions or concerns that should arise related to todays visit.    Time spent: 53min  I have personally obtained a history, examined the patient, evaluated laboratory and imaging results, formulated the assessment and plan and placed orders.    Theresa Gee, MD [facility] Pulmonary and Critical Care Sleep medicine

## 2017-03-12 NOTE — Patient Instructions (Signed)

## 2017-03-15 ENCOUNTER — Ambulatory Visit: Payer: Medicare Other | Admitting: Podiatry

## 2017-03-15 ENCOUNTER — Other Ambulatory Visit: Payer: Self-pay

## 2017-03-15 MED ORDER — ONDANSETRON HCL 4 MG PO TABS: 4.0000 mg | ORAL_TABLET | Freq: Three times a day (TID) | ORAL | 1 refills | Status: DC | PRN

## 2017-03-19 ENCOUNTER — Encounter: Payer: Self-pay | Admitting: Podiatry

## 2017-03-19 ENCOUNTER — Ambulatory Visit (INDEPENDENT_AMBULATORY_CARE_PROVIDER_SITE_OTHER): Payer: Medicare Other | Admitting: Podiatry

## 2017-03-19 DIAGNOSIS — M79676 Pain in unspecified toe(s): Secondary | ICD-10-CM

## 2017-03-19 DIAGNOSIS — E1142 Type 2 diabetes mellitus with diabetic polyneuropathy: Secondary | ICD-10-CM | POA: Diagnosis not present

## 2017-03-19 DIAGNOSIS — L84 Corns and callosities: Secondary | ICD-10-CM

## 2017-03-19 DIAGNOSIS — B351 Tinea unguium: Secondary | ICD-10-CM

## 2017-03-19 NOTE — Progress Notes (Addendum)
Complaint:  Visit Type: Patient returns to my office for continued preventative foot care services. Complaint: Patient states" my nails have grown long and thick and become painful to walk and wear shoes" Patient has been diagnosed with DM with no foot complications. The patient presents for preventative foot care services. No changes to ROS.  Painful callus noted on third toe right foot.  Podiatric Exam: Vascular: dorsalis pedis and posterior tibial pulses are palpable bilateral. Capillary return is immediate. Temperature gradient is WNL. Skin turgor WNL  Sensorium: Normal Semmes Weinstein monofilament test. Normal tactile sensation bilaterally. Nail Exam: Pt has thick disfigured discolored nails with subungual debris noted bilateral entire nail hallux through fifth toenails Ulcer Exam: There is no evidence of ulcer or pre-ulcerative changes or infection. Orthopedic Exam: Muscle tone and strength are WNL. No limitations in general ROM. No crepitus or effusions noted. HAV  B/L with hammer toes 2-4  B/L. Skin: No Porokeratosis. No infection or ulcers.  Clavi 3rd toe right foot.  Diagnosis:  Onychomycosis, , Pain in right toe, pain in left toes  Debride clavi.  Treatment & Plan Procedures and Treatment: Consent by patient was obtained for treatment procedures.   Debridement of mycotic and hypertrophic toenails, 1 through 5 bilateral and clearing of subungual debris. No ulceration, no infection noted. Debride clavi 3rd right.  Padding for hammer toe third left. Return Visit-Office Procedure: Patient instructed to return to the office for a follow up visit 3 months for continued evaluation and treatment.    Gardiner Barefoot DPM

## 2017-03-21 DIAGNOSIS — G20 Parkinson's disease: Secondary | ICD-10-CM | POA: Diagnosis not present

## 2017-03-21 DIAGNOSIS — Z8659 Personal history of other mental and behavioral disorders: Secondary | ICD-10-CM | POA: Diagnosis not present

## 2017-03-21 DIAGNOSIS — G479 Sleep disorder, unspecified: Secondary | ICD-10-CM | POA: Diagnosis not present

## 2017-03-21 DIAGNOSIS — G2581 Restless legs syndrome: Secondary | ICD-10-CM | POA: Diagnosis not present

## 2017-03-21 DIAGNOSIS — F411 Generalized anxiety disorder: Secondary | ICD-10-CM | POA: Diagnosis not present

## 2017-03-21 DIAGNOSIS — F0280 Dementia in other diseases classified elsewhere without behavioral disturbance: Secondary | ICD-10-CM | POA: Diagnosis not present

## 2017-03-28 ENCOUNTER — Ambulatory Visit (INDEPENDENT_AMBULATORY_CARE_PROVIDER_SITE_OTHER): Payer: Medicare Other | Admitting: Internal Medicine

## 2017-03-28 DIAGNOSIS — J4520 Mild intermittent asthma, uncomplicated: Secondary | ICD-10-CM

## 2017-03-28 DIAGNOSIS — R0602 Shortness of breath: Secondary | ICD-10-CM

## 2017-04-09 ENCOUNTER — Other Ambulatory Visit: Payer: Self-pay

## 2017-04-09 ENCOUNTER — Encounter: Payer: Self-pay | Admitting: Internal Medicine

## 2017-04-09 DIAGNOSIS — E039 Hypothyroidism, unspecified: Secondary | ICD-10-CM

## 2017-04-09 MED ORDER — LEVOTHYROXINE SODIUM 175 MCG PO TABS
175.0000 ug | ORAL_TABLET | Freq: Every day | ORAL | 2 refills | Status: DC
Start: 2017-04-09 — End: 2019-02-10

## 2017-04-09 NOTE — Progress Notes (Signed)
[place], [id number]  DATE OF SERVICE:  March 28, 2017  Complete Pulmonary Function Testing Interpretation:  FINDINGS:   forced vital capacity is normal FEV1 is normal FEV1 FVC ratio was mildly decreased.  Post bronchodilator no significant changes noted.  The total lung capacity is normal residual volume was decreased residual volume total lung capacity ratio is decreased FRC is normal.  DLCO is within normal limits  IMPRESSION:    This pulmonary function studies within normal limits.  There is no significant change after bronchodilators however clinical improvement be still occur in the absence of spirometric improvement.  DLCO was within normal limits  Allyne Gee, MD [facility] Pulmonary Critical Care Medicine Sleep Medicine

## 2017-04-09 NOTE — Patient Instructions (Signed)

## 2017-04-11 DIAGNOSIS — E063 Autoimmune thyroiditis: Secondary | ICD-10-CM | POA: Diagnosis not present

## 2017-04-11 DIAGNOSIS — E1165 Type 2 diabetes mellitus with hyperglycemia: Secondary | ICD-10-CM | POA: Diagnosis not present

## 2017-04-11 DIAGNOSIS — E785 Hyperlipidemia, unspecified: Secondary | ICD-10-CM | POA: Diagnosis not present

## 2017-04-12 ENCOUNTER — Ambulatory Visit: Payer: Self-pay | Admitting: Nurse Practitioner

## 2017-04-18 DIAGNOSIS — M159 Polyosteoarthritis, unspecified: Secondary | ICD-10-CM | POA: Diagnosis not present

## 2017-04-18 DIAGNOSIS — E785 Hyperlipidemia, unspecified: Secondary | ICD-10-CM | POA: Diagnosis not present

## 2017-04-18 DIAGNOSIS — E063 Autoimmune thyroiditis: Secondary | ICD-10-CM | POA: Diagnosis not present

## 2017-04-18 DIAGNOSIS — G3183 Dementia with Lewy bodies: Secondary | ICD-10-CM | POA: Diagnosis not present

## 2017-04-18 DIAGNOSIS — E039 Hypothyroidism, unspecified: Secondary | ICD-10-CM | POA: Diagnosis not present

## 2017-04-18 DIAGNOSIS — C73 Malignant neoplasm of thyroid gland: Secondary | ICD-10-CM | POA: Diagnosis not present

## 2017-04-18 DIAGNOSIS — Z6834 Body mass index (BMI) 34.0-34.9, adult: Secondary | ICD-10-CM | POA: Diagnosis not present

## 2017-04-18 DIAGNOSIS — E668 Other obesity: Secondary | ICD-10-CM | POA: Diagnosis not present

## 2017-04-18 DIAGNOSIS — G20 Parkinson's disease: Secondary | ICD-10-CM | POA: Diagnosis not present

## 2017-04-18 DIAGNOSIS — E1165 Type 2 diabetes mellitus with hyperglycemia: Secondary | ICD-10-CM | POA: Diagnosis not present

## 2017-04-19 ENCOUNTER — Encounter: Payer: Self-pay | Admitting: Internal Medicine

## 2017-04-19 ENCOUNTER — Ambulatory Visit (INDEPENDENT_AMBULATORY_CARE_PROVIDER_SITE_OTHER): Payer: Medicare Other | Admitting: Internal Medicine

## 2017-04-19 VITALS — BP 122/60 | HR 63 | Resp 16 | Ht 64.0 in | Wt 213.2 lb

## 2017-04-19 DIAGNOSIS — G2581 Restless legs syndrome: Secondary | ICD-10-CM | POA: Diagnosis not present

## 2017-04-19 DIAGNOSIS — J449 Chronic obstructive pulmonary disease, unspecified: Secondary | ICD-10-CM | POA: Diagnosis not present

## 2017-04-19 DIAGNOSIS — G20 Parkinson's disease: Secondary | ICD-10-CM

## 2017-04-19 DIAGNOSIS — G4733 Obstructive sleep apnea (adult) (pediatric): Secondary | ICD-10-CM | POA: Diagnosis not present

## 2017-04-19 NOTE — Patient Instructions (Signed)

## 2017-04-19 NOTE — Progress Notes (Signed)
[facility] [place], [place] [address]  Pulmonary Sleep Medicine   Office Visit Note  Patient Name: Theresa Mills DOB: 06-25-1950 MRN [id number]  Date of Service: 04/19/2017  Complaints/HPI:  Patient is doing well at this time right now she is on oxygen therapy for her sleep apnea she is not able to tolerate CPAP so she opted to stay with oxygen.  Pulmonary function tests were normal she is actually more of an asthmatic vent COPD.  She is being followed by Neurology for her Parkinson's disease.  Her restless legs have been under control  ROS  General: (-) fever, (-) chills, (-) night sweats, (-) weakness Skin: (-) rashes, (-) itching,. Eyes: (-) visual changes, (-) redness, (-) itching. Nose and Sinuses: (-) nasal stuffiness or itchiness, (-) postnasal drip, (-) nosebleeds, (-) sinus trouble. Mouth and Throat: (-) sore throat, (-) hoarseness. Neck: (-) swollen glands, (-) enlarged thyroid, (-) neck pain. Respiratory: - cough, (-) bloody sputum, + shortness of breath, - wheezing. Cardiovascular: - ankle swelling, (-) chest pain. Lymphatic: (-) lymph node enlargement. Neurologic: (-) numbness, (-) tingling. Psychiatric: (-) anxiety, (-) depression   Current Medication: Outpatient Encounter Medications as of 04/19/2017  Medication Sig Note  . ALPRAZolam (XANAX) 0.5 MG tablet Take 1/2 tablet QAM and 1 tablet po QHS prn   . Calcium Carbonate-Vitamin D (CALCIUM HIGH POTENCY/VITAMIN D) 600-200 MG-UNIT TABS Take by mouth.   . carbidopa-levodopa (SINEMET CR) 50-200 MG per tablet daily. 11/20/2012: Received from: External Pharmacy Received Sig:   . carbidopa-levodopa (SINEMET CR) 50-200 MG tablet Take by mouth.   . carbidopa-levodopa (SINEMET IR) 25-250 MG tablet    . carvedilol (COREG) 6.25 MG tablet daily. 11/20/2012: Received from: External Pharmacy Received Sig:   . celecoxib (CELEBREX) 200 MG capsule Take 200 mg by mouth as needed for pain.   . citalopram  (CELEXA) 40 MG tablet    . donepezil (ARICEPT) 10 MG tablet    . glucose blood (ONETOUCH VERIO) test strip TEST BLOOD SUGAR BID   . INVOKANA 300 MG TABS daily. 11/20/2012: Received from: External Pharmacy Received Sig:   . levothyroxine (SYNTHROID, LEVOTHROID) 175 MCG tablet Take 1 tablet (175 mcg total) by mouth daily.   . metFORMIN (GLUCOPHAGE) 500 MG tablet    . Multiple Vitamins-Minerals (MULTIVITAMIN WOMEN 50+ PO) Take by mouth.   Marland Kitchen omeprazole (PRILOSEC) 40 MG capsule daily. 11/20/2012: Received from: External Pharmacy Received Sig:   . ondansetron (ZOFRAN) 4 MG tablet Take 1 tablet (4 mg total) by mouth every 8 (eight) hours as needed for nausea or vomiting.   Glory Rosebush DELICA LANCETS 71I MISC TEST BLOOD SUGAR BID   . rOPINIRole (REQUIP XL) 2 MG 24 hr tablet Take by mouth.   . sertraline (ZOLOFT) 25 MG tablet    . simvastatin (ZOCOR) 40 MG tablet TAKE 1 TABLET BY MOUTH AT BEDTIME    No facility-administered encounter medications on file as of 04/19/2017.     Surgical History: Past Surgical History:  Procedure Laterality Date  . ABDOMINAL HYSTERECTOMY    . CATARACT EXTRACTION Bilateral   . CHOLECYSTECTOMY    . COLONOSCOPY WITH PROPOFOL N/A 04/02/2015   Procedure: COLONOSCOPY WITH PROPOFOL;  Surgeon: Josefine Class, MD;  Location: [facility] ENDOSCOPY;  Service: Endoscopy;  Laterality: N/A;  . ESOPHAGOGASTRODUODENOSCOPY (EGD) WITH PROPOFOL N/A 04/02/2015   Procedure: ESOPHAGOGASTRODUODENOSCOPY (EGD) WITH PROPOFOL;  Surgeon: Josefine Class, MD;  Location: [facility] ENDOSCOPY;  Service: Endoscopy;  Laterality: N/A;  . GALLBLADDER  SURGERY    . LAPAROSCOPIC HYSTERECTOMY    . THYROIDECTOMY Left   . TONSILLECTOMY    . TONSILLECTOMY Bilateral     Medical History: Past Medical History:  Diagnosis Date  . Anxiety   . Diabetes ([place])   . Difficult intravenous access   . GERD (gastroesophageal reflux disease)   . HBP (high blood pressure)   . Hyperlipidemia   . Osteoarthritis   .  Parkinson disease ([place])   . Sleep apnea   . Thyroid disease     Family History: Family History  Problem Relation Age of Onset  . Asthma Father   . Cancer Sister   . Stroke Maternal Uncle   . Heart disease Maternal Uncle   . Diabetes Maternal Uncle     Social History: Social History   Socioeconomic History  . Marital status: Widowed    Spouse name: Not on file  . Number of children: Not on file  . Years of education: Not on file  . Highest education level: Not on file  Occupational History  . Not on file  Social Needs  . Financial resource strain: Not on file  . Food insecurity:    Worry: Not on file    Inability: Not on file  . Transportation needs:    Medical: Not on file    Non-medical: Not on file  Tobacco Use  . Smoking status: Never Smoker  . Smokeless tobacco: Never Used  Substance and Sexual Activity  . Alcohol use: No    Comment: OCCASIONALLY  . Drug use: No  . Sexual activity: Not on file  Lifestyle  . Physical activity:    Days per week: Not on file    Minutes per session: Not on file  . Stress: Not on file  Relationships  . Social connections:    Talks on phone: Not on file    Gets together: Not on file    Attends religious service: Not on file    Active member of club or organization: Not on file    Attends meetings of clubs or organizations: Not on file    Relationship status: Not on file  . Intimate partner violence:    Fear of current or ex partner: Not on file    Emotionally abused: Not on file    Physically abused: Not on file    Forced sexual activity: Not on file  Other Topics Concern  . Not on file  Social History Narrative  . Not on file    Vital Signs: Blood pressure 122/60, pulse 63, resp. rate 16, height 5\' 4"  (1.626 m), weight 213 lb 3.2 oz (96.7 kg), SpO2 98 %.  Examination: General Appearance: The patient is well-developed, well-nourished, and in no distress. Skin: Gross inspection of skin unremarkable. Head:  normocephalic, no gross deformities. Eyes: no gross deformities noted. ENT: ears appear grossly normal no exudates. Neck: Supple. No thyromegaly. No LAD. Respiratory: no rhonchi noted. Cardiovascular: Normal S1 and S2 without murmur or rub. Extremities: No cyanosis. pulses are equal. Neurologic: Alert and oriented. No involuntary movements.  LABS: No results found for this or any previous visit (from the past 2160 hour(s)).  Radiology: No results found.  No results found.  No results found.    Assessment and Plan: Patient Active Problem List   Diagnosis Date Noted  . Arthritis 01/08/2017  . Cataracts, bilateral 01/08/2017  . Chickenpox 01/08/2017  . Shingles 01/08/2017  . Depression 01/08/2017  . Diabetes mellitus type 2, uncomplicated ([place])  01/08/2017  . Hyperlipidemia, unspecified 01/08/2017  . Hypertension 01/08/2017  . Migraine headache 01/08/2017  . Thyroid disease 01/08/2017  . Parkinson disease ([place]) 01/08/2017  . Anxiety, generalized 09/01/2016  . Dementia due to Parkinson's disease without behavioral disturbance ([place]) 09/01/2016  . History of depression 09/01/2016  . Morbid obesity with BMI of 40.0-44.9, adult ([place]) 09/01/2016  . RLS (restless legs syndrome) 09/01/2016    1. Asthmatic  Currently well controlled will continue with supportive care patient has not had any flare-ups 2. OSA not able to tolerate CPAP but she is using oxygen as an alternate.  We will continue with present management 3. Obesity she is not able to do much in the way of exercise to help her lose weight because of her limited mobility 4. RLS currently controlled will continue to monitor 5. Parkinsons Disease she is under control and under the care of Neurology  General Counseling: I have discussed the findings of the evaluation and examination with Tomi Bamberger.  I have also discussed any further diagnostic evaluation thatmay be needed or ordered today. Ellyana verbalizes understanding of the  findings of todays visit. We also reviewed her medications today and discussed drug interactions and side effects including but not limited excessive drowsiness and altered mental states. We also discussed that there is always a risk not just to her but also people around her. she has been encouraged to call the office with any questions or concerns that should arise related to todays visit.    Time spent: 24min  I have personally obtained a history, examined the patient, evaluated laboratory and imaging results, formulated the assessment and plan and placed orders.    Allyne Gee, MD [facility] Pulmonary and Critical Care Sleep medicine

## 2017-04-26 ENCOUNTER — Telehealth: Payer: Self-pay | Admitting: Nurse Practitioner

## 2017-04-26 DIAGNOSIS — F411 Generalized anxiety disorder: Secondary | ICD-10-CM

## 2017-04-26 MED ORDER — ALPRAZOLAM 0.5 MG PO TABS: ORAL_TABLET | ORAL | 1 refills | Status: DC

## 2017-04-26 NOTE — Telephone Encounter (Signed)
RX REFILL 

## 2017-05-03 ENCOUNTER — Ambulatory Visit: Payer: Self-pay | Admitting: Nurse Practitioner

## 2017-05-08 ENCOUNTER — Ambulatory Visit: Payer: Self-pay | Admitting: Nurse Practitioner

## 2017-05-10 ENCOUNTER — Other Ambulatory Visit: Payer: Self-pay | Admitting: Nurse Practitioner

## 2017-05-10 MED ORDER — SIMVASTATIN 40 MG PO TABS: 40.0000 mg | ORAL_TABLET | Freq: Every day | ORAL | 3 refills | Status: DC

## 2017-05-25 ENCOUNTER — Ambulatory Visit (INDEPENDENT_AMBULATORY_CARE_PROVIDER_SITE_OTHER): Payer: Medicare Other | Admitting: Nurse Practitioner

## 2017-05-25 ENCOUNTER — Encounter: Payer: Self-pay | Admitting: Nurse Practitioner

## 2017-05-25 VITALS — BP 127/65 | HR 71 | Resp 16 | Ht 64.0 in | Wt 210.2 lb

## 2017-05-25 DIAGNOSIS — Z1239 Encounter for other screening for malignant neoplasm of breast: Secondary | ICD-10-CM

## 2017-05-25 DIAGNOSIS — F0280 Dementia in other diseases classified elsewhere without behavioral disturbance: Secondary | ICD-10-CM

## 2017-05-25 DIAGNOSIS — E1165 Type 2 diabetes mellitus with hyperglycemia: Secondary | ICD-10-CM

## 2017-05-25 DIAGNOSIS — G20 Parkinson's disease: Secondary | ICD-10-CM

## 2017-05-25 DIAGNOSIS — Z1231 Encounter for screening mammogram for malignant neoplasm of breast: Secondary | ICD-10-CM

## 2017-05-25 DIAGNOSIS — I10 Essential (primary) hypertension: Secondary | ICD-10-CM | POA: Diagnosis not present

## 2017-05-25 LAB — POCT GLYCOSYLATED HEMOGLOBIN (HGB A1C): HEMOGLOBIN A1C: 5.7

## 2017-05-25 NOTE — Progress Notes (Signed)
[facility] [place], [place] [address]  Internal MEDICINE  Office Visit Note  Patient Name: Theresa Mills  [id number]  [id number]  Date of Service: 06/18/2017   Pt is here for routine follow up.    Chief Complaint  Patient presents with  . Fatigue    very weak   . Weight Loss    losing weight   . Osteoarthritis    the middle fingers bilateral sometimes are very tender   . Diabetes    The patient states that she has some tenderness in the distal joint of middle finger on left and and proximal joint of middle finger on right hand. When bothering her, can cause grips to be weak. This is intermittent. Will take tylenol which helps the pain, sometimes, completely.  Continues to feel weak and has weight loss. She is being followed by neurology and endocrinology to manage thyroid disease and parkinson's type disease.       Current Medication: Outpatient Encounter Medications as of 05/25/2017  Medication Sig Note  . Calcium Carbonate-Vitamin D (CALCIUM HIGH POTENCY/VITAMIN D) 600-200 MG-UNIT TABS Take 1 tablet by mouth daily.    . carbidopa-levodopa (SINEMET CR) 50-200 MG tablet Take 1 tablet by mouth 3 (three) times daily.    . citalopram (CELEXA) 40 MG tablet Take 40 mg by mouth daily.    Marland Kitchen glucose blood (ONETOUCH VERIO) test strip TEST BLOOD SUGAR BID   . INVOKANA 300 MG TABS Take 300 mg by mouth daily before breakfast.    . levothyroxine (SYNTHROID, LEVOTHROID) 175 MCG tablet Take 1 tablet (175 mcg total) by mouth daily. (Patient not taking: Reported on 06/10/2017)   . metFORMIN (GLUCOPHAGE) 500 MG tablet Take 500 mg by mouth 2 (two) times daily with a meal.    . Multiple Vitamins-Minerals (MULTIVITAMIN WOMEN 50+ PO) Take 1 tablet by mouth daily.    Glory Rosebush DELICA LANCETS 08X MISC TEST BLOOD SUGAR BID   . rOPINIRole (REQUIP XL) 2 MG 24 hr tablet Take 2 mg by mouth at bedtime.    . simvastatin (ZOCOR) 40 MG tablet Take 1 tablet (40 mg total) by mouth at  bedtime.   . [DISCONTINUED] ALPRAZolam (XANAX) 0.5 MG tablet Take 1/2 tablet QAM and 1 tablet po QHS prn   . [DISCONTINUED] carbidopa-levodopa (SINEMET CR) 50-200 MG per tablet daily.   . [DISCONTINUED] carbidopa-levodopa (SINEMET IR) 25-250 MG tablet Take 1 tablet by mouth 4 (four) times daily.    . [DISCONTINUED] carvedilol (COREG) 6.25 MG tablet daily.   . [DISCONTINUED] celecoxib (CELEBREX) 200 MG capsule Take 200 mg by mouth as needed for pain.   . [DISCONTINUED] donepezil (ARICEPT) 10 MG tablet    . [DISCONTINUED] omeprazole (PRILOSEC) 40 MG capsule daily. 11/20/2012: Received from: External Pharmacy Received Sig:   . [DISCONTINUED] ondansetron (ZOFRAN) 4 MG tablet Take 1 tablet (4 mg total) by mouth every 8 (eight) hours as needed for nausea or vomiting. (Patient not taking: Reported on 06/10/2017)   . [DISCONTINUED] sertraline (ZOLOFT) 25 MG tablet     No facility-administered encounter medications on file as of 05/25/2017.     Surgical History: Past Surgical History:  Procedure Laterality Date  . ABDOMINAL HYSTERECTOMY    . CATARACT EXTRACTION Bilateral   . CHOLECYSTECTOMY    . COLONOSCOPY WITH PROPOFOL N/A 04/02/2015   Procedure: COLONOSCOPY WITH PROPOFOL;  Surgeon: Josefine Class, MD;  Location: [facility] ENDOSCOPY;  Service: Endoscopy;  Laterality: N/A;  . ESOPHAGOGASTRODUODENOSCOPY (EGD) WITH PROPOFOL  N/A 04/02/2015   Procedure: ESOPHAGOGASTRODUODENOSCOPY (EGD) WITH PROPOFOL;  Surgeon: Josefine Class, MD;  Location: [facility] ENDOSCOPY;  Service: Endoscopy;  Laterality: N/A;  . GALLBLADDER SURGERY    . LAPAROSCOPIC HYSTERECTOMY    . THYROIDECTOMY Left   . TONSILLECTOMY    . TONSILLECTOMY Bilateral     Medical History: Past Medical History:  Diagnosis Date  . Anxiety   . Diabetes ([place])   . Difficult intravenous access   . GERD (gastroesophageal reflux disease)   . HBP (high blood pressure)   . Hyperlipidemia   . Osteoarthritis   . Parkinson disease ([place])   . Sleep  apnea   . Thyroid disease     Family History: Family History  Problem Relation Age of Onset  . Asthma Father   . Cancer Sister   . Stroke Maternal Uncle   . Heart disease Maternal Uncle   . Diabetes Maternal Uncle     Social History   Socioeconomic History  . Marital status: Widowed    Spouse name: Not on file  . Number of children: Not on file  . Years of education: Not on file  . Highest education level: Not on file  Occupational History  . Not on file  Social Needs  . Financial resource strain: Not on file  . Food insecurity:    Worry: Not on file    Inability: Not on file  . Transportation needs:    Medical: Not on file    Non-medical: Not on file  Tobacco Use  . Smoking status: Never Smoker  . Smokeless tobacco: Never Used  Substance and Sexual Activity  . Alcohol use: No    Comment: OCCASIONALLY  . Drug use: No  . Sexual activity: Not on file  Lifestyle  . Physical activity:    Days per week: Not on file    Minutes per session: Not on file  . Stress: Not on file  Relationships  . Social connections:    Talks on phone: Not on file    Gets together: Not on file    Attends religious service: Not on file    Active member of club or organization: Not on file    Attends meetings of clubs or organizations: Not on file    Relationship status: Not on file  . Intimate partner violence:    Fear of current or ex partner: Not on file    Emotionally abused: Not on file    Physically abused: Not on file    Forced sexual activity: Not on file  Other Topics Concern  . Not on file  Social History Narrative  . Not on file      Review of Systems  Constitutional: Positive for appetite change, fatigue and unexpected weight change.  HENT: Negative for congestion, postnasal drip, rhinorrhea, sinus pressure, sinus pain and sore throat.   Eyes: Negative.   Respiratory: Positive for shortness of breath.   Cardiovascular: Negative.   Gastrointestinal: Positive for  nausea.  Endocrine:       History of thyroid disorder, but sees endocrinology  Genitourinary: Positive for dysuria and frequency.       Steong odor in urine.   Skin: Positive for rash.       Very red and inflamed rash in center of chest and around the left breast.   Allergic/Immunologic: Negative.   Neurological: Positive for tremors and weakness.  Hematological: Negative.   Psychiatric/Behavioral: Negative.    Today's Vitals   05/25/17 1130  BP: 127/65  Pulse: 71  Resp: 16  SpO2: 96%  Weight: 210 lb 3.2 oz (95.3 kg)  Height: 5\' 4"  (1.626 m)    Physical Exam  Constitutional: She is oriented to person, place, and time. She appears well-developed and well-nourished. No distress.  HENT:  Head: Normocephalic and atraumatic.  Mouth/Throat: Oropharynx is clear and moist. No oropharyngeal exudate.  Eyes: Pupils are equal, round, and reactive to light. Conjunctivae and EOM are normal.  Neck: Normal range of motion. Neck supple. No JVD present. No tracheal deviation present. No thyromegaly present.  Cardiovascular: Normal rate. An irregular rhythm present. Exam reveals no gallop and no friction rub.  Murmur heard.  Systolic murmur is present with a grade of 2/6. Pulmonary/Chest: Effort normal and breath sounds normal. No respiratory distress. She has no wheezes. She has no rales. She exhibits no tenderness.  Abdominal: Soft. Bowel sounds are normal. There is no tenderness.  Musculoskeletal: Normal range of motion.  Lymphadenopathy:    She has no cervical adenopathy.  Neurological: She is alert and oriented to person, place, and time. No cranial nerve deficit. Coordination normal.  Skin: Skin is warm and dry. Capillary refill takes 2 to 3 seconds. She is not diaphoretic.  Psychiatric: She has a normal mood and affect. Her behavior is normal. Judgment and thought content normal.  Nursing note and vitals reviewed.  Assessment/Plan: 1. Uncontrolled type 2 diabetes mellitus with  hyperglycemia (HCC) - POCT HgB A1C 5.7 today. Continue all diabetic medications as prescribed.   2. Essential hypertension Stable. Continue bp medication as prescribed.   3. Screening for breast cancer - MM DIGITAL SCREENING BILATERAL; Future  4. Dementia due to Parkinson's disease without behavioral disturbance ([place]) Continue regular visits with neurology as scheduled and meds as prescribed   General Counseling: izabelle daus understanding of the findings of todays visit and agrees with plan of treatment. I have discussed any further diagnostic evaluation that may be needed or ordered today. We also reviewed her medications today. she has been encouraged to call the office with any questions or concerns that should arise related to todays visit.  Diabetes Counseling:  1. Addition of ACE inh/ ARB'S for nephroprotection. 2. Diabetic foot care, prevention of complications.  3.Exercise and lose weight.  4. Diabetic eye examination, 5. Monitor blood sugar closlely. nutrition counseling.  6.Sign and symptoms of hypoglycemia including shaking sweating,confusion and headaches.   This patient was seen by Leretha Pol, FNP- C in Collaboration with Dr Lavera Guise as a part of collaborative care agreement    Orders Placed This Encounter  Procedures  . MM DIGITAL SCREENING BILATERAL  . POCT HgB A1C      Time spent:20 Minutes      Dr Lavera Guise Internal medicine

## 2017-05-28 ENCOUNTER — Encounter: Payer: Self-pay | Admitting: Podiatry

## 2017-05-28 ENCOUNTER — Ambulatory Visit (INDEPENDENT_AMBULATORY_CARE_PROVIDER_SITE_OTHER): Payer: Medicare Other | Admitting: Podiatry

## 2017-05-28 DIAGNOSIS — E1142 Type 2 diabetes mellitus with diabetic polyneuropathy: Secondary | ICD-10-CM

## 2017-05-28 DIAGNOSIS — L84 Corns and callosities: Secondary | ICD-10-CM | POA: Diagnosis not present

## 2017-05-28 DIAGNOSIS — B351 Tinea unguium: Secondary | ICD-10-CM

## 2017-05-28 DIAGNOSIS — M79676 Pain in unspecified toe(s): Secondary | ICD-10-CM

## 2017-05-28 NOTE — Progress Notes (Signed)
Complaint:  Visit Type: Patient returns to my office for continued preventative foot care services. Complaint: Patient states" my nails have grown long and thick and become painful to walk and wear shoes" Patient has been diagnosed with DM with no foot complications. The patient presents for preventative foot care services. No changes to ROS.  Painful callus noted on third toe right foot.  Podiatric Exam: Vascular: dorsalis pedis and posterior tibial pulses are palpable bilateral. Capillary return is immediate. Temperature gradient is WNL. Skin turgor WNL  Sensorium: Normal Semmes Weinstein monofilament test. Normal tactile sensation bilaterally. Nail Exam: Pt has thick disfigured discolored nails with subungual debris noted bilateral entire nail hallux through fifth toenails Ulcer Exam: There is no evidence of ulcer or pre-ulcerative changes or infection. Orthopedic Exam: Muscle tone and strength are WNL. No limitations in general ROM. No crepitus or effusions noted. HAV  B/L with hammer toes 2-4  B/L. Skin: No Porokeratosis. No infection or ulcers.  Clavi 3rd toe right foot.  Diagnosis:  Onychomycosis, , Pain in right toe, pain in left toes  Debride clavi.  Treatment & Plan Procedures and Treatment: Consent by patient was obtained for treatment procedures.   Debridement of mycotic and hypertrophic toenails, 1 through 5 bilateral and clearing of subungual debris. No ulceration, no infection noted. Debride clavi 3rd right.  . Return Visit-Office Procedure: Patient instructed to return to the office for a follow up visit 3 months for continued evaluation and treatment.    Gardiner Barefoot DPM

## 2017-05-29 ENCOUNTER — Other Ambulatory Visit: Payer: Self-pay | Admitting: Internal Medicine

## 2017-06-10 ENCOUNTER — Emergency Department: Payer: Medicare Other

## 2017-06-10 ENCOUNTER — Inpatient Hospital Stay
Admission: EM | Admit: 2017-06-10 | Discharge: 2017-06-14 | DRG: 558 | Disposition: A | Discharge status: Skilled Nursing Facility | Payer: Medicare Other | Attending: Internal Medicine | Admitting: Internal Medicine

## 2017-06-10 ENCOUNTER — Other Ambulatory Visit: Payer: Self-pay

## 2017-06-10 DIAGNOSIS — S0512XD Contusion of eyeball and orbital tissues, left eye, subsequent encounter: Secondary | ICD-10-CM | POA: Diagnosis not present

## 2017-06-10 DIAGNOSIS — E876 Hypokalemia: Secondary | ICD-10-CM | POA: Diagnosis present

## 2017-06-10 DIAGNOSIS — S2249XA Multiple fractures of ribs, unspecified side, initial encounter for closed fracture: Secondary | ICD-10-CM | POA: Diagnosis present

## 2017-06-10 DIAGNOSIS — E669 Obesity, unspecified: Secondary | ICD-10-CM | POA: Diagnosis present

## 2017-06-10 DIAGNOSIS — Z8249 Family history of ischemic heart disease and other diseases of the circulatory system: Secondary | ICD-10-CM | POA: Diagnosis not present

## 2017-06-10 DIAGNOSIS — E871 Hypo-osmolality and hyponatremia: Secondary | ICD-10-CM | POA: Diagnosis present

## 2017-06-10 DIAGNOSIS — Z888 Allergy status to other drugs, medicaments and biological substances status: Secondary | ICD-10-CM

## 2017-06-10 DIAGNOSIS — W19XXXD Unspecified fall, subsequent encounter: Secondary | ICD-10-CM | POA: Diagnosis not present

## 2017-06-10 DIAGNOSIS — S2239XA Fracture of one rib, unspecified side, initial encounter for closed fracture: Secondary | ICD-10-CM | POA: Diagnosis present

## 2017-06-10 DIAGNOSIS — S199XXA Unspecified injury of neck, initial encounter: Secondary | ICD-10-CM | POA: Diagnosis not present

## 2017-06-10 DIAGNOSIS — G4730 Sleep apnea, unspecified: Secondary | ICD-10-CM | POA: Diagnosis present

## 2017-06-10 DIAGNOSIS — S2241XA Multiple fractures of ribs, right side, initial encounter for closed fracture: Secondary | ICD-10-CM | POA: Diagnosis present

## 2017-06-10 DIAGNOSIS — R102 Pelvic and perineal pain: Secondary | ICD-10-CM | POA: Diagnosis not present

## 2017-06-10 DIAGNOSIS — Z90710 Acquired absence of both cervix and uterus: Secondary | ICD-10-CM | POA: Diagnosis not present

## 2017-06-10 DIAGNOSIS — Z66 Do not resuscitate: Secondary | ICD-10-CM | POA: Diagnosis not present

## 2017-06-10 DIAGNOSIS — Y92009 Unspecified place in unspecified non-institutional (private) residence as the place of occurrence of the external cause: Secondary | ICD-10-CM

## 2017-06-10 DIAGNOSIS — G20 Parkinson's disease: Secondary | ICD-10-CM | POA: Diagnosis present

## 2017-06-10 DIAGNOSIS — S0240DA Maxillary fracture, left side, initial encounter for closed fracture: Secondary | ICD-10-CM | POA: Diagnosis present

## 2017-06-10 DIAGNOSIS — E860 Dehydration: Secondary | ICD-10-CM | POA: Diagnosis present

## 2017-06-10 DIAGNOSIS — F419 Anxiety disorder, unspecified: Secondary | ICD-10-CM | POA: Diagnosis not present

## 2017-06-10 DIAGNOSIS — Z6834 Body mass index (BMI) 34.0-34.9, adult: Secondary | ICD-10-CM

## 2017-06-10 DIAGNOSIS — W19XXXA Unspecified fall, initial encounter: Secondary | ICD-10-CM | POA: Diagnosis present

## 2017-06-10 DIAGNOSIS — S0012XA Contusion of left eyelid and periocular area, initial encounter: Secondary | ICD-10-CM | POA: Diagnosis present

## 2017-06-10 DIAGNOSIS — M79601 Pain in right arm: Secondary | ICD-10-CM | POA: Diagnosis not present

## 2017-06-10 DIAGNOSIS — S3993XA Unspecified injury of pelvis, initial encounter: Secondary | ICD-10-CM | POA: Diagnosis not present

## 2017-06-10 DIAGNOSIS — S22030A Wedge compression fracture of third thoracic vertebra, initial encounter for closed fracture: Secondary | ICD-10-CM | POA: Diagnosis present

## 2017-06-10 DIAGNOSIS — Z7401 Bed confinement status: Secondary | ICD-10-CM | POA: Diagnosis not present

## 2017-06-10 DIAGNOSIS — L89151 Pressure ulcer of sacral region, stage 1: Secondary | ICD-10-CM | POA: Diagnosis present

## 2017-06-10 DIAGNOSIS — Z7984 Long term (current) use of oral hypoglycemic drugs: Secondary | ICD-10-CM | POA: Diagnosis not present

## 2017-06-10 DIAGNOSIS — E870 Hyperosmolality and hypernatremia: Secondary | ICD-10-CM | POA: Diagnosis present

## 2017-06-10 DIAGNOSIS — S0240DD Maxillary fracture, left side, subsequent encounter for fracture with routine healing: Secondary | ICD-10-CM | POA: Diagnosis not present

## 2017-06-10 DIAGNOSIS — Z9842 Cataract extraction status, left eye: Secondary | ICD-10-CM | POA: Diagnosis not present

## 2017-06-10 DIAGNOSIS — S3992XA Unspecified injury of lower back, initial encounter: Secondary | ICD-10-CM | POA: Diagnosis not present

## 2017-06-10 DIAGNOSIS — R778 Other specified abnormalities of plasma proteins: Secondary | ICD-10-CM | POA: Diagnosis present

## 2017-06-10 DIAGNOSIS — E039 Hypothyroidism, unspecified: Secondary | ICD-10-CM | POA: Diagnosis not present

## 2017-06-10 DIAGNOSIS — E785 Hyperlipidemia, unspecified: Secondary | ICD-10-CM | POA: Diagnosis not present

## 2017-06-10 DIAGNOSIS — S4992XA Unspecified injury of left shoulder and upper arm, initial encounter: Secondary | ICD-10-CM | POA: Diagnosis not present

## 2017-06-10 DIAGNOSIS — S8991XA Unspecified injury of right lower leg, initial encounter: Secondary | ICD-10-CM | POA: Diagnosis not present

## 2017-06-10 DIAGNOSIS — S8992XA Unspecified injury of left lower leg, initial encounter: Secondary | ICD-10-CM | POA: Diagnosis not present

## 2017-06-10 DIAGNOSIS — L89152 Pressure ulcer of sacral region, stage 2: Secondary | ICD-10-CM

## 2017-06-10 DIAGNOSIS — L89212 Pressure ulcer of right hip, stage 2: Secondary | ICD-10-CM | POA: Diagnosis present

## 2017-06-10 DIAGNOSIS — S22030D Wedge compression fracture of third thoracic vertebra, subsequent encounter for fracture with routine healing: Secondary | ICD-10-CM | POA: Diagnosis not present

## 2017-06-10 DIAGNOSIS — K219 Gastro-esophageal reflux disease without esophagitis: Secondary | ICD-10-CM | POA: Diagnosis not present

## 2017-06-10 DIAGNOSIS — S0993XA Unspecified injury of face, initial encounter: Secondary | ICD-10-CM | POA: Diagnosis not present

## 2017-06-10 DIAGNOSIS — S2242XA Multiple fractures of ribs, left side, initial encounter for closed fracture: Secondary | ICD-10-CM | POA: Diagnosis not present

## 2017-06-10 DIAGNOSIS — S0011XA Contusion of right eyelid and periocular area, initial encounter: Secondary | ICD-10-CM | POA: Diagnosis present

## 2017-06-10 DIAGNOSIS — I10 Essential (primary) hypertension: Secondary | ICD-10-CM | POA: Diagnosis present

## 2017-06-10 DIAGNOSIS — M25562 Pain in left knee: Secondary | ICD-10-CM | POA: Diagnosis not present

## 2017-06-10 DIAGNOSIS — M25512 Pain in left shoulder: Secondary | ICD-10-CM | POA: Diagnosis not present

## 2017-06-10 DIAGNOSIS — R4020 Unspecified coma: Secondary | ICD-10-CM | POA: Diagnosis not present

## 2017-06-10 DIAGNOSIS — M6282 Rhabdomyolysis: Principal | ICD-10-CM | POA: Diagnosis present

## 2017-06-10 DIAGNOSIS — F0280 Dementia in other diseases classified elsewhere without behavioral disturbance: Secondary | ICD-10-CM | POA: Diagnosis present

## 2017-06-10 DIAGNOSIS — E119 Type 2 diabetes mellitus without complications: Secondary | ICD-10-CM | POA: Diagnosis present

## 2017-06-10 DIAGNOSIS — R748 Abnormal levels of other serum enzymes: Secondary | ICD-10-CM | POA: Diagnosis not present

## 2017-06-10 DIAGNOSIS — M6281 Muscle weakness (generalized): Secondary | ICD-10-CM | POA: Diagnosis not present

## 2017-06-10 DIAGNOSIS — M25561 Pain in right knee: Secondary | ICD-10-CM | POA: Diagnosis not present

## 2017-06-10 DIAGNOSIS — R9431 Abnormal electrocardiogram [ECG] [EKG]: Secondary | ICD-10-CM | POA: Diagnosis not present

## 2017-06-10 DIAGNOSIS — L8990 Pressure ulcer of unspecified site, unspecified stage: Secondary | ICD-10-CM

## 2017-06-10 DIAGNOSIS — Z885 Allergy status to narcotic agent status: Secondary | ICD-10-CM

## 2017-06-10 DIAGNOSIS — Z9841 Cataract extraction status, right eye: Secondary | ICD-10-CM | POA: Diagnosis not present

## 2017-06-10 DIAGNOSIS — F329 Major depressive disorder, single episode, unspecified: Secondary | ICD-10-CM | POA: Diagnosis not present

## 2017-06-10 DIAGNOSIS — S0990XA Unspecified injury of head, initial encounter: Secondary | ICD-10-CM | POA: Diagnosis not present

## 2017-06-10 DIAGNOSIS — M1990 Unspecified osteoarthritis, unspecified site: Secondary | ICD-10-CM | POA: Diagnosis not present

## 2017-06-10 DIAGNOSIS — R7989 Other specified abnormal findings of blood chemistry: Secondary | ICD-10-CM

## 2017-06-10 DIAGNOSIS — S2249XD Multiple fractures of ribs, unspecified side, subsequent encounter for fracture with routine healing: Secondary | ICD-10-CM | POA: Diagnosis not present

## 2017-06-10 LAB — PROTIME-INR
INR: 1.11
Prothrombin Time: 14.2 seconds (ref 11.4–15.2)

## 2017-06-10 LAB — COMPREHENSIVE METABOLIC PANEL
ALT: 71 U/L — ABNORMAL HIGH (ref 14–54)
ANION GAP: 19 — AB (ref 5–15)
AST: 79 U/L — AB (ref 15–41)
Albumin: 3.6 g/dL (ref 3.5–5.0)
Alkaline Phosphatase: 45 U/L (ref 38–126)
BILIRUBIN TOTAL: 1.8 mg/dL — AB (ref 0.3–1.2)
BUN: 59 mg/dL — ABNORMAL HIGH (ref 6–20)
CHLORIDE: 112 mmol/L — AB (ref 101–111)
CO2: 19 mmol/L — ABNORMAL LOW (ref 22–32)
Calcium: 9.9 mg/dL (ref 8.9–10.3)
Creatinine, Ser: 0.88 mg/dL (ref 0.44–1.00)
GFR calc Af Amer: >60 mL/min (ref >60)
Glucose, Bld: 136 mg/dL — ABNORMAL HIGH (ref 65–99)
POTASSIUM: 3.3 mmol/L — AB (ref 3.5–5.1)
Sodium: 150 mmol/L — ABNORMAL HIGH (ref 135–145)
TOTAL PROTEIN: 7.3 g/dL (ref 6.5–8.1)

## 2017-06-10 LAB — CBC WITH DIFFERENTIAL/PLATELET
BASOS ABS: 0.1 10*3/uL (ref 0–0.1)
BASOS PCT: 0 %
EOS ABS: 0 10*3/uL (ref 0–0.7)
EOS PCT: 0 %
HEMATOCRIT: 51.2 % — AB (ref 35.0–47.0)
Hemoglobin: 17.3 g/dL — ABNORMAL HIGH (ref 12.0–16.0)
Lymphocytes Relative: 5 %
Lymphs Abs: 1.1 10*3/uL (ref 1.0–3.6)
MCH: 31.7 pg (ref 26.0–34.0)
MCHC: 33.8 g/dL (ref 32.0–36.0)
MCV: 93.8 fL (ref 80.0–100.0)
MONOS PCT: 7 %
Monocytes Absolute: 1.6 10*3/uL — ABNORMAL HIGH (ref 0.2–0.9)
Neutro Abs: 18.9 10*3/uL — ABNORMAL HIGH (ref 1.4–6.5)
Neutrophils Relative %: 88 %
Platelets: 199 10*3/uL (ref 150–440)
RBC: 5.45 MIL/uL — ABNORMAL HIGH (ref 3.80–5.20)
RDW: 13.2 % (ref 11.5–14.5)
WBC: 21.6 10*3/uL — ABNORMAL HIGH (ref 3.6–11.0)

## 2017-06-10 LAB — SALICYLATE LEVEL

## 2017-06-10 LAB — CK: Total CK: 1937 U/L — ABNORMAL HIGH (ref 38–234)

## 2017-06-10 LAB — ETHANOL

## 2017-06-10 LAB — TROPONIN I: TROPONIN I: 0.07 ng/mL — AB (ref <0.03)

## 2017-06-10 LAB — ACETAMINOPHEN LEVEL: Acetaminophen (Tylenol), Serum: <10 ug/mL — ABNORMAL LOW (ref 10–30)

## 2017-06-10 LAB — LACTIC ACID, PLASMA: LACTIC ACID, VENOUS: 2.9 mmol/L — AB (ref 0.5–1.9)

## 2017-06-10 MED ORDER — POTASSIUM CHLORIDE IN NACL 20-0.45 MEQ/L-% IV SOLN
INTRAVENOUS | Status: AC
  Administered 2017-06-11: 01:00:00 via INTRAVENOUS
  Filled 2017-06-10 (×2): qty 1000

## 2017-06-10 MED ORDER — ACETAMINOPHEN 650 MG RE SUPP
650.0000 mg | Freq: Four times a day (QID) | RECTAL | Status: DC | PRN
Start: 2017-06-10 — End: 2017-06-14

## 2017-06-10 MED ORDER — ROPINIROLE HCL 1 MG PO TABS
2.0000 mg | ORAL_TABLET | Freq: Every day | ORAL | Status: DC
  Administered 2017-06-11 – 2017-06-13 (×4): 2 mg via ORAL
  Filled 2017-06-10 (×4): qty 2

## 2017-06-10 MED ORDER — ENOXAPARIN SODIUM 40 MG/0.4ML ~~LOC~~ SOLN
40.0000 mg | SUBCUTANEOUS | Status: DC
  Administered 2017-06-12: 40 mg via SUBCUTANEOUS
  Filled 2017-06-10: qty 0.4

## 2017-06-10 MED ORDER — PANTOPRAZOLE SODIUM 40 MG PO TBEC
40.0000 mg | DELAYED_RELEASE_TABLET | Freq: Every day | ORAL | Status: DC
  Administered 2017-06-11 – 2017-06-14 (×4): 40 mg via ORAL
  Filled 2017-06-10 (×4): qty 1

## 2017-06-10 MED ORDER — CARBIDOPA-LEVODOPA 25-250 MG PO TABS
1.0000 | ORAL_TABLET | Freq: Four times a day (QID) | ORAL | Status: DC
  Administered 2017-06-11 – 2017-06-14 (×15): 1 via ORAL
  Filled 2017-06-10 (×17): qty 1

## 2017-06-10 MED ORDER — ONDANSETRON HCL 4 MG/2ML IJ SOLN: 4.0000 mg | Freq: Four times a day (QID) | INTRAMUSCULAR | Status: DC | PRN

## 2017-06-10 MED ORDER — SODIUM CHLORIDE 0.9 % IV SOLN
1.0000 g | Freq: Once | INTRAVENOUS | Status: AC
  Administered 2017-06-10: 1 g via INTRAVENOUS
  Filled 2017-06-10: qty 1

## 2017-06-10 MED ORDER — VANCOMYCIN HCL IN DEXTROSE 1-5 GM/200ML-% IV SOLN: 1000.0000 mg | Freq: Once | INTRAVENOUS | Status: DC

## 2017-06-10 MED ORDER — SODIUM CHLORIDE 0.9 % IV BOLUS
1000.0000 mL | Freq: Once | INTRAVENOUS | Status: AC
  Administered 2017-06-10: 1000 mL via INTRAVENOUS

## 2017-06-10 MED ORDER — ACETAMINOPHEN 325 MG PO TABS
650.0000 mg | ORAL_TABLET | Freq: Four times a day (QID) | ORAL | Status: DC | PRN
Start: 2017-06-10 — End: 2017-06-14
  Administered 2017-06-11 – 2017-06-12 (×2): 650 mg via ORAL
  Filled 2017-06-10 (×2): qty 2

## 2017-06-10 MED ORDER — SODIUM CHLORIDE 0.9 % IV SOLN
Freq: Once | INTRAVENOUS | Status: AC
Start: 2017-06-10 — End: 2017-06-10
  Administered 2017-06-10: 22:00:00 via INTRAVENOUS

## 2017-06-10 MED ORDER — INSULIN ASPART 100 UNIT/ML ~~LOC~~ SOLN
0.0000 [IU] | Freq: Three times a day (TID) | SUBCUTANEOUS | Status: DC
  Administered 2017-06-11 (×2): 1 [IU] via SUBCUTANEOUS
  Administered 2017-06-11 – 2017-06-13 (×4): 2 [IU] via SUBCUTANEOUS
  Filled 2017-06-10 (×6): qty 1

## 2017-06-10 MED ORDER — OXYCODONE HCL 5 MG PO TABS
5.0000 mg | ORAL_TABLET | ORAL | Status: DC | PRN
  Administered 2017-06-11 – 2017-06-13 (×3): 5 mg via ORAL
  Filled 2017-06-10 (×4): qty 1

## 2017-06-10 MED ORDER — CITALOPRAM HYDROBROMIDE 20 MG PO TABS
40.0000 mg | ORAL_TABLET | Freq: Every day | ORAL | Status: DC
  Administered 2017-06-11 – 2017-06-14 (×4): 40 mg via ORAL
  Filled 2017-06-10 (×5): qty 2

## 2017-06-10 MED ORDER — ONDANSETRON HCL 4 MG PO TABS: 4.0000 mg | ORAL_TABLET | Freq: Four times a day (QID) | ORAL | Status: DC | PRN

## 2017-06-10 MED ORDER — INSULIN ASPART 100 UNIT/ML ~~LOC~~ SOLN: 0.0000 [IU] | Freq: Every day | SUBCUTANEOUS | Status: DC

## 2017-06-10 MED ORDER — CARBIDOPA-LEVODOPA ER 50-200 MG PO TBCR
1.0000 | EXTENDED_RELEASE_TABLET | Freq: Three times a day (TID) | ORAL | Status: DC
  Administered 2017-06-11 – 2017-06-14 (×11): 1 via ORAL
  Filled 2017-06-10 (×13): qty 1

## 2017-06-10 NOTE — ED Notes (Signed)
Neighbor Vaughan Basta called [phone] to get update on patient. Patient gave permission to give update

## 2017-06-10 NOTE — ED Triage Notes (Signed)
Patient here for fall at her home. last seen well on Thursday. Patient has two black eyes, large hematoma above left eye, bruising to left shoulder and unknown if there was any LOC and time is unknown how long ago the fall occurred.

## 2017-06-10 NOTE — H&P (Signed)
[facility] NAME: Theresa Mills    MR#:  [id number]  DATE OF BIRTH:  1950/11/18  DATE OF ADMISSION:  06/10/2017  PRIMARY CARE PHYSICIAN: Christie Nottingham, PA   REQUESTING/REFERRING PHYSICIAN: Alfred Levins, MD  CHIEF COMPLAINT:   Chief Complaint  Patient presents with  . Fall    HISTORY OF PRESENT ILLNESS:  Theresa Mills  is a 67 y.o. female who presents with fall at home of unknown etiology.  Patient was found by her friend who went to check on her today.  Last known normal was 3 days ago.  Patient was found here to have rhabdomyolysis, rib fractures, significant facial trauma.  Hospitalist called for admission  PAST MEDICAL HISTORY:   Past Medical History:  Diagnosis Date  . Anxiety   . Diabetes ([place])   . Difficult intravenous access   . GERD (gastroesophageal reflux disease)   . HBP (high blood pressure)   . Hyperlipidemia   . Osteoarthritis   . Parkinson disease ([place])   . Sleep apnea   . Thyroid disease      PAST SURGICAL HISTORY:   Past Surgical History:  Procedure Laterality Date  . ABDOMINAL HYSTERECTOMY    . CATARACT EXTRACTION Bilateral   . CHOLECYSTECTOMY    . COLONOSCOPY WITH PROPOFOL N/A 04/02/2015   Procedure: COLONOSCOPY WITH PROPOFOL;  Surgeon: Josefine Class, MD;  Location: [facility] ENDOSCOPY;  Service: Endoscopy;  Laterality: N/A;  . ESOPHAGOGASTRODUODENOSCOPY (EGD) WITH PROPOFOL N/A 04/02/2015   Procedure: ESOPHAGOGASTRODUODENOSCOPY (EGD) WITH PROPOFOL;  Surgeon: Josefine Class, MD;  Location: [facility] ENDOSCOPY;  Service: Endoscopy;  Laterality: N/A;  . GALLBLADDER SURGERY    . LAPAROSCOPIC HYSTERECTOMY    . THYROIDECTOMY Left   . TONSILLECTOMY    . TONSILLECTOMY Bilateral      SOCIAL HISTORY:   Social History   Tobacco Use  . Smoking status: Never Smoker  . Smokeless tobacco: Never Used  Substance Use Topics  . Alcohol use: No    Comment: OCCASIONALLY     FAMILY HISTORY:    Family History  Problem Relation Age of Onset  . Asthma Father   . Cancer Sister   . Stroke Maternal Uncle   . Heart disease Maternal Uncle   . Diabetes Maternal Uncle      DRUG ALLERGIES:   Allergies  Allergen Reactions  . Codeine Other (See Comments)    HALLUCINATIONS  . Ephadrene [Cholestatin] Other (See Comments)    HYPER AND NERVOUS  . Valium [Diazepam] Other (See Comments)    OVERLY SENSITIVE/TO STRONG    MEDICATIONS AT HOME:   Prior to Admission medications   Medication Sig Start Date End Date Taking? Authorizing Provider  ALPRAZolam Duanne Moron) 0.5 MG tablet Take 1/2 tablet QAM and 1 tablet po QHS prn 04/26/17  Yes Boscia, Heather E, NP  Calcium Carbonate-Vitamin D (CALCIUM HIGH POTENCY/VITAMIN D) 600-200 MG-UNIT TABS Take 1 tablet by mouth daily.    Yes [provider]  carbidopa-levodopa (SINEMET CR) 50-200 MG tablet Take 1 tablet by mouth 3 (three) times daily.    Yes [provider]  carbidopa-levodopa (SINEMET IR) 25-250 MG tablet Take 1 tablet by mouth 4 (four) times daily.    Yes [provider]  celecoxib (CELEBREX) 200 MG capsule Take 200 mg by mouth as needed for pain.   Yes [provider]  citalopram (CELEXA) 40 MG tablet Take 40 mg by mouth daily.    Yes [provider]  INVOKANA 300 MG TABS Take 300 mg by mouth daily before breakfast.    Yes [provider]  metFORMIN (GLUCOPHAGE) 500 MG tablet Take 500 mg by mouth 2 (two) times daily with a meal.    Yes [provider]  Multiple Vitamins-Minerals (MULTIVITAMIN WOMEN 50+ PO) Take 1 tablet by mouth daily.    Yes [provider]  omeprazole (PRILOSEC) 40 MG capsule TAKE 1 CAPSULE BY MOUTH ONCE DAILY 05/30/17  Yes Boscia, Heather E, NP  rOPINIRole (REQUIP XL) 2 MG 24 hr tablet Take 2 mg by mouth at bedtime.    Yes [provider]  sertraline (ZOLOFT) 50 MG tablet Take 50 mg by mouth every morning.   Yes [provider]   simvastatin (ZOCOR) 40 MG tablet Take 1 tablet (40 mg total) by mouth at bedtime. 05/10/17  Yes Ronnell Freshwater, NP  glucose blood (ONETOUCH VERIO) test strip TEST BLOOD SUGAR BID 01/27/16   [provider]  levothyroxine (SYNTHROID, LEVOTHROID) 175 MCG tablet Take 1 tablet (175 mcg total) by mouth daily. Patient not taking: Reported on 06/10/2017 04/09/17   Ronnell Freshwater, NP  ondansetron (ZOFRAN) 4 MG tablet Take 1 tablet (4 mg total) by mouth every 8 (eight) hours as needed for nausea or vomiting. Patient not taking: Reported on 06/10/2017 03/15/17   Ronnell Freshwater, NP  [facility] DELICA LANCETS 28B MISC TEST BLOOD SUGAR BID 01/27/16   [provider]    REVIEW OF SYSTEMS:  Review of Systems  Constitutional: Negative for chills, fever, malaise/fatigue and weight loss.  HENT: Negative for ear pain, hearing loss and tinnitus.   Eyes: Negative for blurred vision, double vision, pain and redness.  Respiratory: Negative for cough, hemoptysis and shortness of breath.   Cardiovascular: Positive for chest pain. Negative for palpitations, orthopnea and leg swelling.  Gastrointestinal: Negative for abdominal pain, constipation, diarrhea, nausea and vomiting.  Genitourinary: Negative for dysuria, frequency and hematuria.  Musculoskeletal: Positive for falls. Negative for back pain, joint pain and neck pain.  Skin:       No acne, rash, or lesions  Neurological: Positive for loss of consciousness. Negative for dizziness, tremors, focal weakness and weakness.  Endo/Heme/Allergies: Negative for polydipsia. Does not bruise/bleed easily.  Psychiatric/Behavioral: Negative for depression. The patient is not nervous/anxious and does not have insomnia.      VITAL SIGNS:   Vitals:   06/10/17 1941 06/10/17 2202 06/10/17 2230  BP: 107/69 (!) 145/75 117/68  Pulse: 91 89 87  Resp: 17  (!) 21  SpO2: 98% 100% 100%  Weight: 91.5 kg (201 lb 11.5 oz)     Wt Readings from Last 3 Encounters:   06/10/17 91.5 kg (201 lb 11.5 oz)  05/25/17 95.3 kg (210 lb 3.2 oz)  04/19/17 96.7 kg (213 lb 3.2 oz)    PHYSICAL EXAMINATION:  Physical Exam  Vitals reviewed. Constitutional: She is oriented to person, place, and time. She appears well-developed and well-nourished. No distress.  HENT:  Head: Normocephalic.  Mouth/Throat: Oropharynx is clear and moist.  Significant facial bruising  Eyes: Pupils are equal, round, and reactive to light. Conjunctivae and EOM are normal. No scleral icterus.  Neck: Normal range of motion. Neck supple. No JVD present. No thyromegaly present.  Cardiovascular: Normal rate, regular rhythm and intact distal pulses. Exam reveals no gallop and no friction rub.  No murmur heard. Respiratory: Effort normal and breath sounds normal. No respiratory distress. She has no wheezes. She has no rales.  She exhibits tenderness.  GI: Soft. Bowel sounds are normal. She exhibits no distension. There is no tenderness.  Musculoskeletal: Normal range of motion. She exhibits no edema.  No arthritis, no gout  Lymphadenopathy:    She has no cervical adenopathy.  Neurological: She is alert and oriented to person, place, and time. No cranial nerve deficit.  No dysarthria, no aphasia  Skin: Skin is warm and dry. No rash noted. No erythema.  Psychiatric: She has a normal mood and affect. Her behavior is normal. Judgment and thought content normal.    LABORATORY PANEL:   CBC Recent Labs  Lab 06/10/17 1947  WBC 21.6*  HGB 17.3*  HCT 51.2*  PLT 199   ------------------------------------------------------------------------------------------------------------------  Chemistries  Recent Labs  Lab 06/10/17 1947  NA 150*  K 3.3*  CL 112*  CO2 19*  GLUCOSE 136*  BUN 59*  CREATININE 0.88  CALCIUM 9.9  AST 79*  ALT 71*  ALKPHOS 45  BILITOT 1.8*   ------------------------------------------------------------------------------------------------------------------  Cardiac  Enzymes Recent Labs  Lab 06/10/17 1947  TROPONINI 0.07*   ------------------------------------------------------------------------------------------------------------------  RADIOLOGY:  Dg Pelvis 1-2 Views  Result Date: 06/10/2017 CLINICAL DATA:  Acute pelvic pain following fall. Initial encounter. EXAM: PELVIS - 1-2 VIEW COMPARISON:  04/10/2013 radiographs FINDINGS: No acute fracture, subluxation or dislocation. Degenerative changes in the hips and LOWER lumbar spine again noted. No focal bony lesions are present. IMPRESSION: No acute abnormality. Degenerative changes. Electronically Signed   By: Margarette Canada M.D.   On: 06/10/2017 21:07   Dg Knee 2 Views Left  Result Date: 06/10/2017 CLINICAL DATA:  Acute LEFT knee pain following fall. Initial encounter. EXAM: LEFT KNEE - 1-2 VIEW COMPARISON:  None. FINDINGS: No acute fracture, subluxation or dislocation. No knee effusion. Moderate degenerative changes in the MEDIAL and LATERAL compartments and severe degenerative changes in the patellofemoral compartment noted. No bony lesions are present. IMPRESSION: 1. No acute abnormality 2. Tricompartmental degenerative changes, severe in the patellofemoral compartment. Electronically Signed   By: Margarette Canada M.D.   On: 06/10/2017 21:10   Dg Knee 2 Views Right  Result Date: 06/10/2017 CLINICAL DATA:  Acute RIGHT knee pain following fall. Initial encounter. EXAM: RIGHT KNEE - 1-2 VIEW COMPARISON:  None. FINDINGS: No acute fracture or dislocation. Severe tricompartmental degenerative changes are present. No definite knee effusion noted. IMPRESSION: No evidence of acute abnormality. Severe tricompartmental degenerative changes. Electronically Signed   By: Margarette Canada M.D.   On: 06/10/2017 21:09   Ct Head Wo Contrast  Result Date: 06/10/2017 CLINICAL DATA:  Fall at home with head, face, and cervical spine injury. Hematoma above left eye. Unknown loss of consciousness. EXAM: CT HEAD WITHOUT CONTRAST CT  MAXILLOFACIAL WITHOUT CONTRAST CT CERVICAL SPINE WITHOUT CONTRAST TECHNIQUE: Multidetector CT imaging of the head, cervical spine, and maxillofacial structures were performed using the standard protocol without intravenous contrast. Multiplanar CT image reconstructions of the cervical spine and maxillofacial structures were also generated. COMPARISON:  Brain MRI 04/03/2014, head CT 01/12/2014 FINDINGS: CT HEAD FINDINGS Brain: Patient's head is tilted in the scanner. Generalized atrophy with mild progression from prior exam. No acute hemorrhage. No mass effect or midline shift. No evidence of acute ischemia. No hydrocephalus. Vascular: No hyperdense vessel or unexpected calcification. Skull: Again seen hyperostosis.  No skull fracture. Other: Left frontal/supraorbital scalp hematoma. CT MAXILLOFACIAL FINDINGS Osseous: Nasal bone, zygomatic arches, nasal bones are intact. The temporomandibular joints are congruent. No fracture of the pterygoid plates. Periodontal disease. Orbits: No acute orbital fracture.  Both orbits and globes are intact. Bilateral cataract resection. Left periorbital hematoma. Sinuses: Cortical buckling of the anterior and lateral walls of the left maxillary sinus, new from prior exam suspicious for nondisplaced fracture. No significant hemosinus. Small fluid level in the right maxillary sinus without right maxillary sinus fracture. Small fluid levels within both sphenoid sinuses. Mastoid air cells are clear. Soft tissues: Soft tissue edema about the left and right cheek. Left periorbital soft tissue hematomas. CT CERVICAL SPINE FINDINGS Alignment: Broad-based levo scoliotic curvature. No traumatic subluxation. Skull base and vertebrae: No acute fracture of the cervical spine. Mild loss of height of T3 vertebra, better assessed on dedicated thoracic spine CT. Vertebral body heights are maintained. The dens and skull base are intact. Soft tissues and spinal canal: No prevertebral fluid or swelling.  No visible canal hematoma. Disc levels: Normal for age disc space narrowing and endplate spurring. Mild scattered facet arthropathy. Upper chest: No acute finding. Other: Mild carotid calcifications. IMPRESSION: 1. No acute intracranial abnormality. No skull fracture. Generalized atrophy with mild progression since 2016. 2. Cortical buckling of the frontal and lateral walls of the left maxillary sinus, new from prior exam, suspicious for nondisplaced fracture, however no sinus fluid level or hemosinus. 3. Left periorbital soft tissue hematomas.  No orbital fracture. 4. Expected for age degenerative change in the cervical spine. No acute fracture or subluxation. Electronically Signed   By: Jeb Levering M.D.   On: 06/10/2017 21:27   Ct Cervical Spine Wo Contrast  Result Date: 06/10/2017 CLINICAL DATA:  Fall at home with head, face, and cervical spine injury. Hematoma above left eye. Unknown loss of consciousness. EXAM: CT HEAD WITHOUT CONTRAST CT MAXILLOFACIAL WITHOUT CONTRAST CT CERVICAL SPINE WITHOUT CONTRAST TECHNIQUE: Multidetector CT imaging of the head, cervical spine, and maxillofacial structures were performed using the standard protocol without intravenous contrast. Multiplanar CT image reconstructions of the cervical spine and maxillofacial structures were also generated. COMPARISON:  Brain MRI 04/03/2014, head CT 01/12/2014 FINDINGS: CT HEAD FINDINGS Brain: Patient's head is tilted in the scanner. Generalized atrophy with mild progression from prior exam. No acute hemorrhage. No mass effect or midline shift. No evidence of acute ischemia. No hydrocephalus. Vascular: No hyperdense vessel or unexpected calcification. Skull: Again seen hyperostosis.  No skull fracture. Other: Left frontal/supraorbital scalp hematoma. CT MAXILLOFACIAL FINDINGS Osseous: Nasal bone, zygomatic arches, nasal bones are intact. The temporomandibular joints are congruent. No fracture of the pterygoid plates. Periodontal  disease. Orbits: No acute orbital fracture. Both orbits and globes are intact. Bilateral cataract resection. Left periorbital hematoma. Sinuses: Cortical buckling of the anterior and lateral walls of the left maxillary sinus, new from prior exam suspicious for nondisplaced fracture. No significant hemosinus. Small fluid level in the right maxillary sinus without right maxillary sinus fracture. Small fluid levels within both sphenoid sinuses. Mastoid air cells are clear. Soft tissues: Soft tissue edema about the left and right cheek. Left periorbital soft tissue hematomas. CT CERVICAL SPINE FINDINGS Alignment: Broad-based levo scoliotic curvature. No traumatic subluxation. Skull base and vertebrae: No acute fracture of the cervical spine. Mild loss of height of T3 vertebra, better assessed on dedicated thoracic spine CT. Vertebral body heights are maintained. The dens and skull base are intact. Soft tissues and spinal canal: No prevertebral fluid or swelling. No visible canal hematoma. Disc levels: Normal for age disc space narrowing and endplate spurring. Mild scattered facet arthropathy. Upper chest: No acute finding. Other: Mild carotid calcifications. IMPRESSION: 1. No acute intracranial abnormality. No skull fracture.  Generalized atrophy with mild progression since 2016. 2. Cortical buckling of the frontal and lateral walls of the left maxillary sinus, new from prior exam, suspicious for nondisplaced fracture, however no sinus fluid level or hemosinus. 3. Left periorbital soft tissue hematomas.  No orbital fracture. 4. Expected for age degenerative change in the cervical spine. No acute fracture or subluxation. Electronically Signed   By: Jeb Levering M.D.   On: 06/10/2017 21:27   Ct Thoracic Spine Wo Contrast  Result Date: 06/10/2017 CLINICAL DATA:  Fall.  Back pain EXAM: CT THORACIC SPINE WITHOUT CONTRAST TECHNIQUE: Multidetector CT images of the thoracic were obtained using the standard protocol  without intravenous contrast. COMPARISON:  None. FINDINGS: Alignment: Normal alignment.  Moderate dextroscoliosis Vertebrae: Mild compression fracture superior endplate of T3, probably acute. No prior studies for comparison. No other fracture or mass Paraspinal and other soft tissues: Negative Disc levels: Multilevel disc degeneration throughout the thoracic spine with flowing osteophytes bilaterally, left greater than right. IMPRESSION: Mild compression fracture of T3, probably acute Thoracic scoliosis and multilevel degenerative change with extensive osteophyte formation. Electronically Signed   By: Franchot Gallo M.D.   On: 06/10/2017 21:14   Ct Lumbar Spine Wo Contrast  Result Date: 06/10/2017 CLINICAL DATA:  Fall.  Back pain. EXAM: CT LUMBAR SPINE WITHOUT CONTRAST TECHNIQUE: Multidetector CT imaging of the lumbar spine was performed without intravenous contrast administration. Multiplanar CT image reconstructions were also generated. COMPARISON:  None. FINDINGS: Segmentation: Normal Alignment: 10 mm anterolisthesis L5-S1 due to chronic bilateral pars defects of L5. Remaining alignment normal. Moderate levoscoliosis at L2-3 Vertebrae: Mild compression deformity of T12 of indeterminate age however no acute fracture line is identified suggesting this may be chronic. Paraspinal and other soft tissues: Mild atherosclerotic disease. No retroperitoneal mass or hematoma. Disc levels: L1-2: Mild disc degeneration and spurring L2-3: Disc degeneration and spurring. Large right lateral osteophyte. Mild right foraminal narrowing L3-4: Mild disc degeneration and mild facet degeneration without stenosis L4-5: Mild disc and mild facet degeneration L5-S1: Chronic bilateral pars defects of L5 with grade 1 anterolisthesis 10 mm. No significant foraminal stenosis. IMPRESSION: Mild compression fracture of T12, probably chronic. Chronic bilateral pars defects of L5 with 10 mm anterolisthesis L5-S1. Electronically Signed   By:  Franchot Gallo M.D.   On: 06/10/2017 21:20   Dg Chest Portable 1 View  Result Date: 06/10/2017 CLINICAL DATA:  Acute fall and pain. EXAM: PORTABLE CHEST 1 VIEW COMPARISON:  03/04/2014 radiograph FINDINGS: The cardiomediastinal silhouette is unchanged. There is no evidence of focal airspace disease, pulmonary edema, suspicious pulmonary nodule/mass, pleural effusion, or pneumothorax. There appear to be fractures of the RIGHT fourth through eighth ribs. No other acute bony abnormalities are noted. A thoracic scoliosis is again identified. IMPRESSION: Apparent fractures of the RIGHT fourth through eighth ribs. No pleural effusion or pneumothorax. Electronically Signed   By: Margarette Canada M.D.   On: 06/10/2017 21:13   Dg Shoulder Left  Result Date: 06/10/2017 CLINICAL DATA:  Acute LEFT shoulder pain following fall. Initial encounter. EXAM: LEFT SHOULDER - 2+ VIEW COMPARISON:  03/04/2014 chest radiographs FINDINGS: No acute fracture, subluxation or dislocation. Glenohumeral joint degenerative changes are present. No suspicious focal bony lesions are identified. IMPRESSION: No evidence of acute abnormality. Degenerative changes. Electronically Signed   By: Margarette Canada M.D.   On: 06/10/2017 21:14   Ct Maxillofacial Wo Contrast  Result Date: 06/10/2017 CLINICAL DATA:  Fall at home with head, face, and cervical spine injury. Hematoma above left eye. Unknown  loss of consciousness. EXAM: CT HEAD WITHOUT CONTRAST CT MAXILLOFACIAL WITHOUT CONTRAST CT CERVICAL SPINE WITHOUT CONTRAST TECHNIQUE: Multidetector CT imaging of the head, cervical spine, and maxillofacial structures were performed using the standard protocol without intravenous contrast. Multiplanar CT image reconstructions of the cervical spine and maxillofacial structures were also generated. COMPARISON:  Brain MRI 04/03/2014, head CT 01/12/2014 FINDINGS: CT HEAD FINDINGS Brain: Patient's head is tilted in the scanner. Generalized atrophy with mild  progression from prior exam. No acute hemorrhage. No mass effect or midline shift. No evidence of acute ischemia. No hydrocephalus. Vascular: No hyperdense vessel or unexpected calcification. Skull: Again seen hyperostosis.  No skull fracture. Other: Left frontal/supraorbital scalp hematoma. CT MAXILLOFACIAL FINDINGS Osseous: Nasal bone, zygomatic arches, nasal bones are intact. The temporomandibular joints are congruent. No fracture of the pterygoid plates. Periodontal disease. Orbits: No acute orbital fracture. Both orbits and globes are intact. Bilateral cataract resection. Left periorbital hematoma. Sinuses: Cortical buckling of the anterior and lateral walls of the left maxillary sinus, new from prior exam suspicious for nondisplaced fracture. No significant hemosinus. Small fluid level in the right maxillary sinus without right maxillary sinus fracture. Small fluid levels within both sphenoid sinuses. Mastoid air cells are clear. Soft tissues: Soft tissue edema about the left and right cheek. Left periorbital soft tissue hematomas. CT CERVICAL SPINE FINDINGS Alignment: Broad-based levo scoliotic curvature. No traumatic subluxation. Skull base and vertebrae: No acute fracture of the cervical spine. Mild loss of height of T3 vertebra, better assessed on dedicated thoracic spine CT. Vertebral body heights are maintained. The dens and skull base are intact. Soft tissues and spinal canal: No prevertebral fluid or swelling. No visible canal hematoma. Disc levels: Normal for age disc space narrowing and endplate spurring. Mild scattered facet arthropathy. Upper chest: No acute finding. Other: Mild carotid calcifications. IMPRESSION: 1. No acute intracranial abnormality. No skull fracture. Generalized atrophy with mild progression since 2016. 2. Cortical buckling of the frontal and lateral walls of the left maxillary sinus, new from prior exam, suspicious for nondisplaced fracture, however no sinus fluid level or  hemosinus. 3. Left periorbital soft tissue hematomas.  No orbital fracture. 4. Expected for age degenerative change in the cervical spine. No acute fracture or subluxation. Electronically Signed   By: Jeb Levering M.D.   On: 06/10/2017 21:27    EKG:   Orders placed or performed during the hospital encounter of 06/10/17  . ED EKG  . ED EKG  . EKG 12-Lead  . EKG 12-Lead    IMPRESSION AND PLAN:  Principal Problem:   Fall -unclear etiology for her fall.  We have covered her with antibiotics and are currently working of potential causes.  Treatment of secondary pathologies as below Active Problems:   Rhabdomyolysis -renal function is okay, CK close to 2000, IV fluids and monitor   Elevated troponin -likely secondary to rhabdomyolysis, however we will trend her cardiac enzymes tonight   Hypernatremia -secondary to profound dehydration, IV fluids as above   Rib fractures -PRN analgesia, incentive spirometry   Diabetes mellitus type 2, uncomplicated (HCC) -sliding scale insulin with corresponding glucose checks   Hypertension -continue home meds   Dementia due to Parkinson's disease without behavioral disturbance ([place]) -continue home medications  Chart review performed and case discussed with ED provider. Labs, imaging and/or ECG reviewed by provider and discussed with patient/family. Management plans discussed with the patient and/or family.  DVT PROPHYLAXIS: SubQ lovenox  GI PROPHYLAXIS: None  ADMISSION STATUS: Inpatient  CODE STATUS: Full  TOTAL TIME TAKING CARE OF THIS PATIENT: 45 minutes.   Dell Hurtubise [place] 06/10/2017, 10:33 PM  [facility]  Office  [phone]  CC: Primary care physician; Christie Nottingham, PA  Note:  This document was prepared using Dragon voice recognition software and may include unintentional dictation errors.

## 2017-06-10 NOTE — ED Provider Notes (Signed)
[facility] Emergency Department Provider Note ____________________________________________   First MD Initiated Contact with Patient 06/10/17 1938     (approximate)  I have reviewed the triage vital signs and the nursing notes.   HISTORY  Chief Complaint Fall  Level 5 caveat: History of present illness limited due to apparent altered mental status  HPI AEDYN MCKEON is a 67 y.o. female with PMH as noted below who presents with head injury and possible altered mental status after an apparent fall.  Per EMS, the patient was last seen at her baseline on Thursday (3 days ago).  She was found on the floor in her home.  She was noted to have bruising to her head and face and left shoulder.  She states that she believes that she fell, but does not know when or how.  She is unable to provide any other relevant history.  Past Medical History:  Diagnosis Date  . Anxiety   . Diabetes ([address])   . Difficult intravenous access   . GERD (gastroesophageal reflux disease)   . HBP (high blood pressure)   . Hyperlipidemia   . Osteoarthritis   . Parkinson disease ([place])   . Sleep apnea   . Thyroid disease     Patient Active Problem List   Diagnosis Date Noted  . Arthritis 01/08/2017  . Cataracts, bilateral 01/08/2017  . Chickenpox 01/08/2017  . Shingles 01/08/2017  . Depression 01/08/2017  . Diabetes mellitus type 2, uncomplicated ([place]) 78/29/5621  . Hyperlipidemia, unspecified 01/08/2017  . Hypertension 01/08/2017  . Migraine headache 01/08/2017  . Thyroid disease 01/08/2017  . Parkinson disease ([place]) 01/08/2017  . Anxiety, generalized 09/01/2016  . Dementia due to Parkinson's disease without behavioral disturbance ([place]) 09/01/2016  . History of depression 09/01/2016  . Morbid obesity with BMI of 40.0-44.9, adult ([place]) 09/01/2016  . RLS (restless legs syndrome) 09/01/2016    Past Surgical History:  Procedure Laterality Date  . ABDOMINAL HYSTERECTOMY    .  CATARACT EXTRACTION Bilateral   . CHOLECYSTECTOMY    . COLONOSCOPY WITH PROPOFOL N/A 04/02/2015   Procedure: COLONOSCOPY WITH PROPOFOL;  Surgeon: Josefine Class, MD;  Location: [facility] ENDOSCOPY;  Service: Endoscopy;  Laterality: N/A;  . ESOPHAGOGASTRODUODENOSCOPY (EGD) WITH PROPOFOL N/A 04/02/2015   Procedure: ESOPHAGOGASTRODUODENOSCOPY (EGD) WITH PROPOFOL;  Surgeon: Josefine Class, MD;  Location: [facility] ENDOSCOPY;  Service: Endoscopy;  Laterality: N/A;  . GALLBLADDER SURGERY    . LAPAROSCOPIC HYSTERECTOMY    . THYROIDECTOMY Left   . TONSILLECTOMY    . TONSILLECTOMY Bilateral     Prior to Admission medications   Medication Sig Start Date End Date Taking? Authorizing Provider  ALPRAZolam Duanne Moron) 0.5 MG tablet Take 1/2 tablet QAM and 1 tablet po QHS prn 04/26/17   Ronnell Freshwater, NP  Calcium Carbonate-Vitamin D (CALCIUM HIGH POTENCY/VITAMIN D) 600-200 MG-UNIT TABS Take by mouth.    [provider]  carbidopa-levodopa (SINEMET CR) 50-200 MG per tablet daily. 11/01/12   [provider]  carbidopa-levodopa (SINEMET CR) 50-200 MG tablet Take by mouth. 07/19/16   [provider]  carbidopa-levodopa (SINEMET IR) 25-250 MG tablet  12/28/16   [provider]  carvedilol (COREG) 6.25 MG tablet daily. 11/01/12   [provider]  celecoxib (CELEBREX) 200 MG capsule Take 200 mg by mouth as needed for pain.    [provider]  citalopram (CELEXA) 40 MG tablet  12/20/16   [provider]  donepezil (ARICEPT) 10 MG tablet  11/08/16  [provider]  glucose blood (ONETOUCH VERIO) test strip TEST BLOOD SUGAR BID 01/27/16   [provider]  INVOKANA 300 MG TABS daily. 11/18/12   [provider]  levothyroxine (SYNTHROID, LEVOTHROID) 175 MCG tablet Take 1 tablet (175 mcg total) by mouth daily. 04/09/17   Ronnell Freshwater, NP  metFORMIN (GLUCOPHAGE) 500 MG tablet  10/24/16   [provider]  Multiple  Vitamins-Minerals (MULTIVITAMIN WOMEN 50+ PO) Take by mouth.    [provider]  omeprazole (PRILOSEC) 40 MG capsule TAKE 1 CAPSULE BY MOUTH ONCE DAILY 05/30/17   Ronnell Freshwater, NP  ondansetron (ZOFRAN) 4 MG tablet Take 1 tablet (4 mg total) by mouth every 8 (eight) hours as needed for nausea or vomiting. 03/15/17   Ronnell Freshwater, NP  ONETOUCH DELICA LANCETS 67E MISC TEST BLOOD SUGAR BID 01/27/16   [provider]  rOPINIRole (REQUIP XL) 2 MG 24 hr tablet Take by mouth. 01/17/17 04/17/17  [provider]  sertraline (ZOLOFT) 25 MG tablet  12/19/16   [provider]  simvastatin (ZOCOR) 40 MG tablet Take 1 tablet (40 mg total) by mouth at bedtime. 05/10/17   Ronnell Freshwater, NP    Allergies Codeine; Ephadrene [cholestatin]; and Valium [diazepam]  Family History  Problem Relation Age of Onset  . Asthma Father   . Cancer Sister   . Stroke Maternal Uncle   . Heart disease Maternal Uncle   . Diabetes Maternal Uncle     Social History Social History   Tobacco Use  . Smoking status: Never Smoker  . Smokeless tobacco: Never Used  Substance Use Topics  . Alcohol use: No    Comment: OCCASIONALLY  . Drug use: No    Review of Systems Level 5 caveat: Unable to obtain complete review of systems due to altered mental status    ____________________________________________   PHYSICAL EXAM:  VITAL SIGNS: ED Triage Vitals [06/10/17 1941]  Enc Vitals Group     BP 107/69     Pulse Rate 91     Resp 17     Temp      Temp src      SpO2 98 %     Weight 201 lb 11.5 oz (91.5 kg)     Height      Head Circumference      Peak Flow      Pain Score      Pain Loc      Pain Edu?      Excl. in [place]?     Constitutional: Alert, but somewhat confused and slow to respond.  Uncomfortable appearing. Eyes: Conjunctivae are normal.  EOMI.  PERRLA.  Significant bilateral periorbital ecchymosis and swelling.  Left-sided subconjunctival hemorrhage. Head:  Significant bilateral maxillofacial ecchymosis. Nose: No congestion/rhinnorhea. Mouth/Throat: Mucous membranes are dry.   Neck: Normal range of motion.  No midline C-spine tenderness. Cardiovascular: Normal rate, regular rhythm. Grossly normal heart sounds.  Good peripheral circulation. Respiratory: Normal respiratory effort.  No retractions. Lungs CTA with decreased breath sounds bilaterally but no significant wheezes or rales. Gastrointestinal: Soft and nontender. No distention.  Genitourinary: No flank tenderness. Musculoskeletal: No lower extremity edema.  Extremities warm and well perfused.  Pain on range of motion of bilateral knees.  Ecchymosis to anterior left shoulder. Neurologic: Somewhat slurred speech, although this may be due to dry mouth.  Motor intact in all extremities.  Tremor, which patient states is chronic and due to Parkinson's. Skin:  Skin is warm  and dry. No rash noted. Psychiatric: Cooperative with exam.  ____________________________________________   LABS (all labs ordered are listed, but only abnormal results are displayed)  Labs Reviewed  TROPONIN I - Abnormal; Notable for the following components:      Result Value   Troponin I 0.07 (*)    All other components within normal limits  CBC WITH DIFFERENTIAL/PLATELET - Abnormal; Notable for the following components:   WBC 21.6 (*)    RBC 5.45 (*)    Hemoglobin 17.3 (*)    HCT 51.2 (*)    Neutro Abs 18.9 (*)    Monocytes Absolute 1.6 (*)    All other components within normal limits  PROTIME-INR  COMPREHENSIVE METABOLIC PANEL  ETHANOL  ACETAMINOPHEN LEVEL  SALICYLATE LEVEL  LACTIC ACID, PLASMA  LACTIC ACID, PLASMA  URINALYSIS, COMPLETE (UACMP) WITH MICROSCOPIC  CK   ____________________________________________  EKG  ED ECG REPORT I, Arta Silence, the attending physician, personally viewed and interpreted this ECG.  Date: 06/10/2017 EKG Time: 1948 Rate: 90 Rhythm: normal sinus rhythm QRS  Axis: Left axis deviation Intervals: Short PR ST/T Wave abnormalities: Nonspecific diffuse T wave flattening Narrative Interpretation: no evidence of acute ischemia  ____________________________________________  RADIOLOGY  Pending:  CT head: CT C-spine: CT T/L-spine: CXR: XR pelvis: XR knees:   ____________________________________________   PROCEDURES  Procedure(s) performed: No  Procedures  Critical Care performed: No ____________________________________________   INITIAL IMPRESSION / ASSESSMENT AND PLAN / ED COURSE  Pertinent labs & imaging results that were available during my care of the patient were reviewed by me and considered in my medical decision making (see chart for details).  67 year old female with PMH as noted above including history of Parkinson's presents after she was found in her home after an apparent fall.  Patient was last seen at her baseline several days ago and it is unclear how long she was on the ground.  Patient is unable to provide much relevant history.  She reports pain to her head and neck as well as to bilateral knees.  I reviewed the past medical records in Epic; the patient was most recently seen in the ED and admitted in 2015 after syncope and fall with UTI.  On exam, the vital signs are normal.  The patient is extremely weak appearing.  She has dry mucous membranes and significant facial and periorbital bruising; the remainder the exam is as described above.  Differential for cause of the apparent fall and weakness is very broad, but includes deconditioning related to obesity and Parkinson's, dehydration, other metabolic etiology, infection/sepsis, ACS, or CNS cause.  We will give fluids, obtain work-up to evaluate for these causes, and reassess.  Anticipate likely admission.  In addition to the medical work-up, patient will require extensive imaging to evaluate for injury.  We will obtain CT head, CT of the whole spine, chest and  pelvis x-rays, left shoulder and bilateral knee x-rays.     ----------------------------------------- 8:31 PM on 06/10/2017 -----------------------------------------  Patient still pending most of her lab work-up and imaging results.  I signed the patient out to the oncoming physician Dr. Alfred Levins.  Plan will be admission when the work-up is complete.  ____________________________________________   FINAL CLINICAL IMPRESSION(S) / ED DIAGNOSES  Final diagnoses:  Fall, initial encounter      NEW MEDICATIONS STARTED DURING THIS VISIT:  New Prescriptions   No medications on file     Note:  This document was prepared using Dragon voice recognition software and may include unintentional  dictation errors.    Arta Silence, MD 06/10/17 2031

## 2017-06-10 NOTE — ED Notes (Addendum)
Date and time results received: 06/10/17 2023  Test: troponin  Critical Value: 0.07  Name of Provider Notified: Dr. Cherylann Banas

## 2017-06-10 NOTE — ED Notes (Signed)
Patient was cleaned, given grape juice, repositioned and external catheter was placed. Patient is unable to urinate so urine sample not collected yet. MD said not to cath because she has not had fluids in a couple days.

## 2017-06-10 NOTE — ED Notes (Signed)
Patient's watch was found working and sitting on table in room ED12.  Unit notified we were tubing up item wrapped in towel to protect from damage by tube transit.

## 2017-06-11 ENCOUNTER — Other Ambulatory Visit: Payer: Self-pay

## 2017-06-11 DIAGNOSIS — S2249XA Multiple fractures of ribs, unspecified side, initial encounter for closed fracture: Secondary | ICD-10-CM | POA: Diagnosis present

## 2017-06-11 DIAGNOSIS — S2239XA Fracture of one rib, unspecified side, initial encounter for closed fracture: Secondary | ICD-10-CM | POA: Diagnosis present

## 2017-06-11 DIAGNOSIS — L89152 Pressure ulcer of sacral region, stage 2: Secondary | ICD-10-CM

## 2017-06-11 DIAGNOSIS — L8990 Pressure ulcer of unspecified site, unspecified stage: Secondary | ICD-10-CM

## 2017-06-11 LAB — URINALYSIS, COMPLETE (UACMP) WITH MICROSCOPIC
Bacteria, UA: NONE SEEN
Bilirubin Urine: NEGATIVE
Glucose, UA: >=500 mg/dL — AB
Ketones, ur: 20 mg/dL — AB
Nitrite: NEGATIVE
Protein, ur: 30 mg/dL — AB
Specific Gravity, Urine: 1.028 (ref 1.005–1.030)
pH: 6 (ref 5.0–8.0)

## 2017-06-11 LAB — TROPONIN I
TROPONIN I: 0.05 ng/mL — AB (ref <0.03)
TROPONIN I: 0.04 ng/mL — AB (ref <0.03)
Troponin I: 0.06 ng/mL (ref <0.03)

## 2017-06-11 LAB — CBC
HCT: 45.5 % (ref 35.0–47.0)
HEMATOCRIT: 47.2 % — AB (ref 35.0–47.0)
HEMOGLOBIN: 15.9 g/dL (ref 12.0–16.0)
Hemoglobin: 15.3 g/dL (ref 12.0–16.0)
MCH: 31.1 pg (ref 26.0–34.0)
MCH: 31.2 pg (ref 26.0–34.0)
MCHC: 33.6 g/dL (ref 32.0–36.0)
MCHC: 33.7 g/dL (ref 32.0–36.0)
MCV: 92.4 fL (ref 80.0–100.0)
MCV: 92.4 fL (ref 80.0–100.0)
Platelets: 196 10*3/uL (ref 150–440)
Platelets: 197 10*3/uL (ref 150–440)
RBC: 4.92 MIL/uL (ref 3.80–5.20)
RBC: 5.11 MIL/uL (ref 3.80–5.20)
RDW: 13 % (ref 11.5–14.5)
RDW: 13.3 % (ref 11.5–14.5)
WBC: 17.3 10*3/uL — ABNORMAL HIGH (ref 3.6–11.0)
WBC: 18.7 10*3/uL — AB (ref 3.6–11.0)

## 2017-06-11 LAB — GLUCOSE, CAPILLARY
Glucose-Capillary: 140 mg/dL — ABNORMAL HIGH (ref 65–99)
Glucose-Capillary: 145 mg/dL — ABNORMAL HIGH (ref 65–99)
Glucose-Capillary: 169 mg/dL — ABNORMAL HIGH (ref 65–99)
Glucose-Capillary: 188 mg/dL — ABNORMAL HIGH (ref 65–99)
Glucose-Capillary: 190 mg/dL — ABNORMAL HIGH (ref 65–99)

## 2017-06-11 LAB — COMPREHENSIVE METABOLIC PANEL
ALBUMIN: 3.3 g/dL — AB (ref 3.5–5.0)
ALT: 8 U/L — ABNORMAL LOW (ref 14–54)
ANION GAP: 11 (ref 5–15)
AST: 69 U/L — ABNORMAL HIGH (ref 15–41)
Alkaline Phosphatase: 37 U/L — ABNORMAL LOW (ref 38–126)
BILIRUBIN TOTAL: 2 mg/dL — AB (ref 0.3–1.2)
BUN: 59 mg/dL — AB (ref 6–20)
CALCIUM: 8.9 mg/dL (ref 8.9–10.3)
CO2: 19 mmol/L — AB (ref 22–32)
Chloride: 118 mmol/L — ABNORMAL HIGH (ref 101–111)
Creatinine, Ser: 0.79 mg/dL (ref 0.44–1.00)
GFR calc non Af Amer: >60 mL/min (ref >60)
GLUCOSE: 145 mg/dL — AB (ref 65–99)
POTASSIUM: 3 mmol/L — AB (ref 3.5–5.1)
SODIUM: 148 mmol/L — AB (ref 135–145)
TOTAL PROTEIN: 6.3 g/dL — AB (ref 6.5–8.1)

## 2017-06-11 LAB — CREATININE, SERUM: Creatinine, Ser: 0.73 mg/dL (ref 0.44–1.00)

## 2017-06-11 LAB — LACTIC ACID, PLASMA: Lactic Acid, Venous: 1.9 mmol/L (ref 0.5–1.9)

## 2017-06-11 LAB — MAGNESIUM: MAGNESIUM: 2 mg/dL (ref 1.7–2.4)

## 2017-06-11 LAB — PHOSPHORUS: PHOSPHORUS: 1.9 mg/dL — AB (ref 2.5–4.6)

## 2017-06-11 MED ORDER — POTASSIUM PHOSPHATES 15 MMOLE/5ML IV SOLN
15.0000 mmol | Freq: Once | INTRAVENOUS | Status: AC
  Administered 2017-06-11: 15 mmol via INTRAVENOUS
  Filled 2017-06-11 (×2): qty 5

## 2017-06-11 MED ORDER — ADULT MULTIVITAMIN W/MINERALS CH
1.0000 | ORAL_TABLET | Freq: Every day | ORAL | Status: DC
  Administered 2017-06-12 – 2017-06-14 (×3): 1 via ORAL
  Filled 2017-06-11 (×3): qty 1

## 2017-06-11 MED ORDER — OCUVITE-LUTEIN PO CAPS
1.0000 | ORAL_CAPSULE | Freq: Every day | ORAL | Status: DC
  Administered 2017-06-11 – 2017-06-13 (×3): 1 via ORAL
  Filled 2017-06-11 (×4): qty 1

## 2017-06-11 MED ORDER — ENSURE ENLIVE PO LIQD
237.0000 mL | Freq: Two times a day (BID) | ORAL | Status: DC
  Administered 2017-06-11 – 2017-06-14 (×6): 237 mL via ORAL

## 2017-06-11 NOTE — Care Management (Signed)
Patient admitted from home after being found down by a friend.  Lives alone. Rib fractures and  rhabdomyolysis .  consult for CM and SW for possible facility placement.  Requested physical therapy  and occupational therapy consult from attending.

## 2017-06-11 NOTE — Progress Notes (Signed)
The patient is admitted to room 253 with the diagnosis of fall. Alert and oriented x 3 with intermittent confusion. Full assessment to epic completed. Skin assessment done with Maracar RN, noted scattered bruises, blisters, abrasions and pressure ulcer.  Patient was offered oxycodone 5 mg as needed for c/o generalized pain of 8/10 on a pain scale. Will continue to monitor.

## 2017-06-11 NOTE — Progress Notes (Addendum)
[facility] [place] NAME: Quinette Hentges    MR#:  [id number]  DATE OF BIRTH:  [birth year]  SUBJECTIVE:  CHIEF COMPLAINT:   Chief Complaint  Patient presents with  . Fall  Patient seen and evaluated today Was lethargic this morning opens eyes to loud verbal commands Not completely oriented Has periorbital ecchymosis bilaterally  REVIEW OF SYSTEMS:    ROS Could not be obtained completely as patient was lethargic   DRUG ALLERGIES:   Allergies  Allergen Reactions  . Codeine Other (See Comments)    HALLUCINATIONS  . Ephadrene [Cholestatin] Other (See Comments)    HYPER AND NERVOUS  . Valium [Diazepam] Other (See Comments)    OVERLY SENSITIVE/TO STRONG    VITALS:  Blood pressure 123/88, pulse 94, temperature 98.7 F (37.1 C), resp. rate (!) 22, height 5\' 4"  (1.626 m), weight 90.3 kg (199 lb 1.6 oz), SpO2 93 %.  PHYSICAL EXAMINATION:   Physical Exam  GENERAL:  67 y.o.-year-old patient lying in the bed  EYES: Pupils equal, round, reactive to light and accommodation. No scleral icterus. Extraocular muscles intact.  HEENT: Head normocephalic. Oropharynx and nasopharynx clear.  Ecchymosis around the orbits in both eyes along with bruises over the frontal area of the head NECK:  Supple, no jugular venous distention. No thyroid enlargement, no tenderness.  LUNGS: Normal breath sounds bilaterally, no wheezing, rales, rhonchi. No use of accessory muscles of respiration.  CARDIOVASCULAR: S1, S2 normal. No murmurs, rubs, or gallops.  ABDOMEN: Soft, nontender, nondistended. Bowel sounds present. No organomegaly or mass.  EXTREMITIES: No cyanosis, clubbing or edema b/l.    NEUROLOGIC: Arousable to loud verbal commands Not completely oriented Moves all extremities PSYCHIATRIC: Could not be assessed SKIN: No obvious rash, lesion, or ulcer.   LABORATORY PANEL:   CBC Recent Labs  Lab 06/11/17 0517  WBC 17.3*  HGB 15.3  HCT 45.5  PLT  196   ------------------------------------------------------------------------------------------------------------------ Chemistries  Recent Labs  Lab 06/11/17 0517 06/11/17 1117  NA 148*  --   K 3.0*  --   CL 118*  --   CO2 19*  --   GLUCOSE 145*  --   BUN 59*  --   CREATININE 0.79  --   CALCIUM 8.9  --   MG  --  2.0  AST 69*  --   ALT 8*  --   ALKPHOS 37*  --   BILITOT 2.0*  --    ------------------------------------------------------------------------------------------------------------------  Cardiac Enzymes Recent Labs  Lab 06/11/17 1117  TROPONINI 0.04*   ------------------------------------------------------------------------------------------------------------------  RADIOLOGY:  Dg Pelvis 1-2 Views  Result Date: 06/10/2017 CLINICAL DATA:  Acute pelvic pain following fall. Initial encounter. EXAM: PELVIS - 1-2 VIEW COMPARISON:  04/10/2013 radiographs FINDINGS: No acute fracture, subluxation or dislocation. Degenerative changes in the hips and LOWER lumbar spine again noted. No focal bony lesions are present. IMPRESSION: No acute abnormality. Degenerative changes. Electronically Signed   By: Margarette Canada M.D.   On: 06/10/2017 21:07   Dg Knee 2 Views Left  Result Date: 06/10/2017 CLINICAL DATA:  Acute LEFT knee pain following fall. Initial encounter. EXAM: LEFT KNEE - 1-2 VIEW COMPARISON:  None. FINDINGS: No acute fracture, subluxation or dislocation. No knee effusion. Moderate degenerative changes in the MEDIAL and LATERAL compartments and severe degenerative changes in the patellofemoral compartment noted. No bony lesions are present. IMPRESSION: 1. No acute abnormality 2. Tricompartmental degenerative changes, severe in the patellofemoral compartment. Electronically Signed   By: Dellis Filbert  Hu M.D.   On: 06/10/2017 21:10   Dg Knee 2 Views Right  Result Date: 06/10/2017 CLINICAL DATA:  Acute RIGHT knee pain following fall. Initial encounter. EXAM: RIGHT KNEE - 1-2 VIEW  COMPARISON:  None. FINDINGS: No acute fracture or dislocation. Severe tricompartmental degenerative changes are present. No definite knee effusion noted. IMPRESSION: No evidence of acute abnormality. Severe tricompartmental degenerative changes. Electronically Signed   By: Margarette Canada M.D.   On: 06/10/2017 21:09   Ct Head Wo Contrast  Result Date: 06/10/2017 CLINICAL DATA:  Fall at home with head, face, and cervical spine injury. Hematoma above left eye. Unknown loss of consciousness. EXAM: CT HEAD WITHOUT CONTRAST CT MAXILLOFACIAL WITHOUT CONTRAST CT CERVICAL SPINE WITHOUT CONTRAST TECHNIQUE: Multidetector CT imaging of the head, cervical spine, and maxillofacial structures were performed using the standard protocol without intravenous contrast. Multiplanar CT image reconstructions of the cervical spine and maxillofacial structures were also generated. COMPARISON:  Brain MRI 04/03/2014, head CT 01/12/2014 FINDINGS: CT HEAD FINDINGS Brain: Patient's head is tilted in the scanner. Generalized atrophy with mild progression from prior exam. No acute hemorrhage. No mass effect or midline shift. No evidence of acute ischemia. No hydrocephalus. Vascular: No hyperdense vessel or unexpected calcification. Skull: Again seen hyperostosis.  No skull fracture. Other: Left frontal/supraorbital scalp hematoma. CT MAXILLOFACIAL FINDINGS Osseous: Nasal bone, zygomatic arches, nasal bones are intact. The temporomandibular joints are congruent. No fracture of the pterygoid plates. Periodontal disease. Orbits: No acute orbital fracture. Both orbits and globes are intact. Bilateral cataract resection. Left periorbital hematoma. Sinuses: Cortical buckling of the anterior and lateral walls of the left maxillary sinus, new from prior exam suspicious for nondisplaced fracture. No significant hemosinus. Small fluid level in the right maxillary sinus without right maxillary sinus fracture. Small fluid levels within both sphenoid sinuses.  Mastoid air cells are clear. Soft tissues: Soft tissue edema about the left and right cheek. Left periorbital soft tissue hematomas. CT CERVICAL SPINE FINDINGS Alignment: Broad-based levo scoliotic curvature. No traumatic subluxation. Skull base and vertebrae: No acute fracture of the cervical spine. Mild loss of height of T3 vertebra, better assessed on dedicated thoracic spine CT. Vertebral body heights are maintained. The dens and skull base are intact. Soft tissues and spinal canal: No prevertebral fluid or swelling. No visible canal hematoma. Disc levels: Normal for age disc space narrowing and endplate spurring. Mild scattered facet arthropathy. Upper chest: No acute finding. Other: Mild carotid calcifications. IMPRESSION: 1. No acute intracranial abnormality. No skull fracture. Generalized atrophy with mild progression since 2016. 2. Cortical buckling of the frontal and lateral walls of the left maxillary sinus, new from prior exam, suspicious for nondisplaced fracture, however no sinus fluid level or hemosinus. 3. Left periorbital soft tissue hematomas.  No orbital fracture. 4. Expected for age degenerative change in the cervical spine. No acute fracture or subluxation. Electronically Signed   By: Jeb Levering M.D.   On: 06/10/2017 21:27   Ct Cervical Spine Wo Contrast  Result Date: 06/10/2017 CLINICAL DATA:  Fall at home with head, face, and cervical spine injury. Hematoma above left eye. Unknown loss of consciousness. EXAM: CT HEAD WITHOUT CONTRAST CT MAXILLOFACIAL WITHOUT CONTRAST CT CERVICAL SPINE WITHOUT CONTRAST TECHNIQUE: Multidetector CT imaging of the head, cervical spine, and maxillofacial structures were performed using the standard protocol without intravenous contrast. Multiplanar CT image reconstructions of the cervical spine and maxillofacial structures were also generated. COMPARISON:  Brain MRI 04/03/2014, head CT 01/12/2014 FINDINGS: CT HEAD FINDINGS Brain: Patient's head  is tilted  in the scanner. Generalized atrophy with mild progression from prior exam. No acute hemorrhage. No mass effect or midline shift. No evidence of acute ischemia. No hydrocephalus. Vascular: No hyperdense vessel or unexpected calcification. Skull: Again seen hyperostosis.  No skull fracture. Other: Left frontal/supraorbital scalp hematoma. CT MAXILLOFACIAL FINDINGS Osseous: Nasal bone, zygomatic arches, nasal bones are intact. The temporomandibular joints are congruent. No fracture of the pterygoid plates. Periodontal disease. Orbits: No acute orbital fracture. Both orbits and globes are intact. Bilateral cataract resection. Left periorbital hematoma. Sinuses: Cortical buckling of the anterior and lateral walls of the left maxillary sinus, new from prior exam suspicious for nondisplaced fracture. No significant hemosinus. Small fluid level in the right maxillary sinus without right maxillary sinus fracture. Small fluid levels within both sphenoid sinuses. Mastoid air cells are clear. Soft tissues: Soft tissue edema about the left and right cheek. Left periorbital soft tissue hematomas. CT CERVICAL SPINE FINDINGS Alignment: Broad-based levo scoliotic curvature. No traumatic subluxation. Skull base and vertebrae: No acute fracture of the cervical spine. Mild loss of height of T3 vertebra, better assessed on dedicated thoracic spine CT. Vertebral body heights are maintained. The dens and skull base are intact. Soft tissues and spinal canal: No prevertebral fluid or swelling. No visible canal hematoma. Disc levels: Normal for age disc space narrowing and endplate spurring. Mild scattered facet arthropathy. Upper chest: No acute finding. Other: Mild carotid calcifications. IMPRESSION: 1. No acute intracranial abnormality. No skull fracture. Generalized atrophy with mild progression since 2016. 2. Cortical buckling of the frontal and lateral walls of the left maxillary sinus, new from prior exam, suspicious for nondisplaced  fracture, however no sinus fluid level or hemosinus. 3. Left periorbital soft tissue hematomas.  No orbital fracture. 4. Expected for age degenerative change in the cervical spine. No acute fracture or subluxation. Electronically Signed   By: Jeb Levering M.D.   On: 06/10/2017 21:27   Ct Thoracic Spine Wo Contrast  Result Date: 06/10/2017 CLINICAL DATA:  Fall.  Back pain EXAM: CT THORACIC SPINE WITHOUT CONTRAST TECHNIQUE: Multidetector CT images of the thoracic were obtained using the standard protocol without intravenous contrast. COMPARISON:  None. FINDINGS: Alignment: Normal alignment.  Moderate dextroscoliosis Vertebrae: Mild compression fracture superior endplate of T3, probably acute. No prior studies for comparison. No other fracture or mass Paraspinal and other soft tissues: Negative Disc levels: Multilevel disc degeneration throughout the thoracic spine with flowing osteophytes bilaterally, left greater than right. IMPRESSION: Mild compression fracture of T3, probably acute Thoracic scoliosis and multilevel degenerative change with extensive osteophyte formation. Electronically Signed   By: Franchot Gallo M.D.   On: 06/10/2017 21:14   Ct Lumbar Spine Wo Contrast  Result Date: 06/10/2017 CLINICAL DATA:  Fall.  Back pain. EXAM: CT LUMBAR SPINE WITHOUT CONTRAST TECHNIQUE: Multidetector CT imaging of the lumbar spine was performed without intravenous contrast administration. Multiplanar CT image reconstructions were also generated. COMPARISON:  None. FINDINGS: Segmentation: Normal Alignment: 10 mm anterolisthesis L5-S1 due to chronic bilateral pars defects of L5. Remaining alignment normal. Moderate levoscoliosis at L2-3 Vertebrae: Mild compression deformity of T12 of indeterminate age however no acute fracture line is identified suggesting this may be chronic. Paraspinal and other soft tissues: Mild atherosclerotic disease. No retroperitoneal mass or hematoma. Disc levels: L1-2: Mild disc  degeneration and spurring L2-3: Disc degeneration and spurring. Large right lateral osteophyte. Mild right foraminal narrowing L3-4: Mild disc degeneration and mild facet degeneration without stenosis L4-5: Mild disc and mild facet degeneration L5-S1:  Chronic bilateral pars defects of L5 with grade 1 anterolisthesis 10 mm. No significant foraminal stenosis. IMPRESSION: Mild compression fracture of T12, probably chronic. Chronic bilateral pars defects of L5 with 10 mm anterolisthesis L5-S1. Electronically Signed   By: Franchot Gallo M.D.   On: 06/10/2017 21:20   Dg Chest Portable 1 View  Result Date: 06/10/2017 CLINICAL DATA:  Acute fall and pain. EXAM: PORTABLE CHEST 1 VIEW COMPARISON:  03/04/2014 radiograph FINDINGS: The cardiomediastinal silhouette is unchanged. There is no evidence of focal airspace disease, pulmonary edema, suspicious pulmonary nodule/mass, pleural effusion, or pneumothorax. There appear to be fractures of the RIGHT fourth through eighth ribs. No other acute bony abnormalities are noted. A thoracic scoliosis is again identified. IMPRESSION: Apparent fractures of the RIGHT fourth through eighth ribs. No pleural effusion or pneumothorax. Electronically Signed   By: Margarette Canada M.D.   On: 06/10/2017 21:13   Dg Shoulder Left  Result Date: 06/10/2017 CLINICAL DATA:  Acute LEFT shoulder pain following fall. Initial encounter. EXAM: LEFT SHOULDER - 2+ VIEW COMPARISON:  03/04/2014 chest radiographs FINDINGS: No acute fracture, subluxation or dislocation. Glenohumeral joint degenerative changes are present. No suspicious focal bony lesions are identified. IMPRESSION: No evidence of acute abnormality. Degenerative changes. Electronically Signed   By: Margarette Canada M.D.   On: 06/10/2017 21:14   Ct Maxillofacial Wo Contrast  Result Date: 06/10/2017 CLINICAL DATA:  Fall at home with head, face, and cervical spine injury. Hematoma above left eye. Unknown loss of consciousness. EXAM: CT HEAD WITHOUT  CONTRAST CT MAXILLOFACIAL WITHOUT CONTRAST CT CERVICAL SPINE WITHOUT CONTRAST TECHNIQUE: Multidetector CT imaging of the head, cervical spine, and maxillofacial structures were performed using the standard protocol without intravenous contrast. Multiplanar CT image reconstructions of the cervical spine and maxillofacial structures were also generated. COMPARISON:  Brain MRI 04/03/2014, head CT 01/12/2014 FINDINGS: CT HEAD FINDINGS Brain: Patient's head is tilted in the scanner. Generalized atrophy with mild progression from prior exam. No acute hemorrhage. No mass effect or midline shift. No evidence of acute ischemia. No hydrocephalus. Vascular: No hyperdense vessel or unexpected calcification. Skull: Again seen hyperostosis.  No skull fracture. Other: Left frontal/supraorbital scalp hematoma. CT MAXILLOFACIAL FINDINGS Osseous: Nasal bone, zygomatic arches, nasal bones are intact. The temporomandibular joints are congruent. No fracture of the pterygoid plates. Periodontal disease. Orbits: No acute orbital fracture. Both orbits and globes are intact. Bilateral cataract resection. Left periorbital hematoma. Sinuses: Cortical buckling of the anterior and lateral walls of the left maxillary sinus, new from prior exam suspicious for nondisplaced fracture. No significant hemosinus. Small fluid level in the right maxillary sinus without right maxillary sinus fracture. Small fluid levels within both sphenoid sinuses. Mastoid air cells are clear. Soft tissues: Soft tissue edema about the left and right cheek. Left periorbital soft tissue hematomas. CT CERVICAL SPINE FINDINGS Alignment: Broad-based levo scoliotic curvature. No traumatic subluxation. Skull base and vertebrae: No acute fracture of the cervical spine. Mild loss of height of T3 vertebra, better assessed on dedicated thoracic spine CT. Vertebral body heights are maintained. The dens and skull base are intact. Soft tissues and spinal canal: No prevertebral fluid  or swelling. No visible canal hematoma. Disc levels: Normal for age disc space narrowing and endplate spurring. Mild scattered facet arthropathy. Upper chest: No acute finding. Other: Mild carotid calcifications. IMPRESSION: 1. No acute intracranial abnormality. No skull fracture. Generalized atrophy with mild progression since 2016. 2. Cortical buckling of the frontal and lateral walls of the left maxillary sinus, new from prior  exam, suspicious for nondisplaced fracture, however no sinus fluid level or hemosinus. 3. Left periorbital soft tissue hematomas.  No orbital fracture. 4. Expected for age degenerative change in the cervical spine. No acute fracture or subluxation. Electronically Signed   By: Jeb Levering M.D.   On: 06/10/2017 21:27     ASSESSMENT AND PLAN:  67 year old female patient with history of anxiety disorder, diabetes mellitus, GERD, hypertension, hyperlipidemia, osteoarthritis, Parkinson's disease, sleep apnea presented to the emergency room for fall and rib fractures and a facial trauma .  -Hypernatremia IV fluid hydration  follow-up sodium level  -Hypokalemia Replace potassium  -Fall and gait instability PT and OT evaluation  -Hypophosphatemia  replace phosphorus  -Periorbital soft tissue hematoma secondary to fall Monitor closely  -Elevated troponin secondary to rhabdomyolysis  -Diabetes mellitus type 2 sliding scale coverage with insulin  -Dementia due to Parkinson's disease supportive care  -Rhabdomyolysis IV fluids and follow-up CK level  -Dehydration IV fluids    All the records are reviewed and case discussed with Care Management/Social Worker. Management plans discussed with the patient, family and they are in agreement.  CODE STATUS: Full code  DVT Prophylaxis: SCDs  TOTAL TIME TAKING CARE OF THIS PATIENT: 35 minutes.   POSSIBLE D/C IN 2 to 3 DAYS, DEPENDING ON CLINICAL CONDITION.  Saundra Shelling M.D on 06/11/2017 at 1:13 PM  Between  7am to 6pm - Pager - [phone]  After 6pm go to [email or web address] - password [email or web address] [facility]  Office  [phone]  CC: Primary care physician; Christie Nottingham, PA  Note: This dictation was prepared with Dragon dictation along with smaller phrase technology. Any transcriptional errors that result from this process are unintentional.

## 2017-06-11 NOTE — Progress Notes (Addendum)
Initial Nutrition Assessment  DOCUMENTATION CODES:   Obesity unspecified  INTERVENTION:   Ensure Enlive po BID, each supplement provides 350 kcal and 20 grams of protein  MVI daily  Ocuvite daily for wound healing (provides zinc, vitamin A, vitamin C, Vitamin E, copper, and selenium)  Magic cup TID with meals, each supplement provides 290 kcal and 9 grams of protein  Liberalize diet   NUTRITION DIAGNOSIS:   Inadequate oral intake related to acute illness as evidenced by meal completion < 25%.  GOAL:   Patient will meet greater than or equal to 90% of their needs  MONITOR:   PO intake, Supplement acceptance, Labs, Weight trends, Skin, I & O's  REASON FOR ASSESSMENT:   Malnutrition Screening Tool    ASSESSMENT:   67 year old female with PMH as noted above including history of Parkinson's presents after she was found in her home after an apparent fall.    Visited pt's room today. Unable to obtain nutrition related history as pt is lethargic. Pt documented to only be eating sips and bites of meals. It appears, pt has lost 22lbs(10%) over the past 5 months; this is significant given the time frame. RD suspects pt with inadequate oral intake pta. Pt likely at high refeeding risk; recommend monitor K, Mg, and P labs. Pt noted to have sacral wound. RD will order supplements and vitamins to help pt meet her estimated needs and encourage wound healing.  Medications reviewed and include: lovenox, insulin, protonix, K phos, oxycodone  Labs reviewed: Na 148(H), K 3.0(L), BUN 59(H), P 1.9(L), Mg 2.0 wnl Wbc- 17.3(H) cbgs- 92, 169, 140, 145 x 24 hrs  NUTRITION - FOCUSED PHYSICAL EXAM:    Most Recent Value  Orbital Region  No depletion  Upper Arm Region  No depletion  Thoracic and Lumbar Region  No depletion  Buccal Region  No depletion  Temple Region  No depletion  Clavicle Bone Region  No depletion  Clavicle and Acromion Bone Region  No depletion  Scapular Bone Region  No  depletion  Dorsal Hand  No depletion  Patellar Region  No depletion  Anterior Thigh Region  No depletion  Posterior Calf Region  No depletion  Edema (RD Assessment)  Moderate  Hair  Reviewed  Eyes  Reviewed  Mouth  Reviewed  Skin  Reviewed [ecchymosis and abrasions secondary to fall ]  Nails  Reviewed     Diet Order:   Diet Order           Diet heart healthy/carb modified Room service appropriate? Yes; Fluid consistency: Thin  Diet effective now         EDUCATION NEEDS:   No education needs have been identified at this time  Skin:  Skin Assessment: (Stage II sacrum )  Last BM:  PTA  Height:   Ht Readings from Last 1 Encounters:  06/11/17 5\' 4"  (1.626 m)    Weight:   Wt Readings from Last 1 Encounters:  06/11/17 199 lb 1.6 oz (90.3 kg)    Ideal Body Weight:  54.5 kg  BMI:  Body mass index is 34.18 kg/m.  Estimated Nutritional Needs:   Kcal:  1600-1900kcal/day   Protein:  90-108g/day   Fluid:  >1.4L/day   Koleen Distance MS, RD, LDN Pager #- [phone] Office#- [phone] After Hours Pager: [phone]

## 2017-06-11 NOTE — Evaluation (Signed)
Physical Therapy Evaluation Patient Details Name: Theresa Mills MRN: [id number] DOB: Jun 07, 1950 Today's Date: 06/11/2017   History of Present Illness  Patient is a 67 year old female who lives alone and was found at home down after fall. Patient reports she uses rollator at baseline for mobility and has the assistance of a friend for needs.    Clinical Impression  Patient seen for PT Eval. Unable to perform sit><stand transfer or ambulate this day, but did perform bed mobility supine>< sit with max +2 assist. Patient reports pain with mobility but none at rest. Patient will benefit from PT to address her decreased mobility and decreased function for return to prior functional level. Patient will benefit from SNF placement upon discharge.    Follow Up Recommendations SNF;Supervision for mobility/OOB    Equipment Recommendations  Rolling walker with 5" wheels(patient has rollator at home)    Recommendations for Other Services       Precautions / Restrictions Precautions Precautions: None Restrictions Weight Bearing Restrictions: No      Mobility  Bed Mobility Overal bed mobility: Needs Assistance Bed Mobility: Supine to Sit;Sit to Supine     Supine to sit: +2 for physical assistance;Max assist Sit to supine: +2 for physical assistance;Max assist   General bed mobility comments: patient limited by pain and lethargy  Transfers                 General transfer comment: not attempted this visit  Ambulation/Gait                Stairs            Wheelchair Mobility    Modified Rankin (Stroke Patients Only)       Balance Overall balance assessment: Needs assistance Sitting-balance support: Bilateral upper extremity supported   Sitting balance - Comments: patient requires assistance with maintaining sitting balance Postural control: Posterior lean;Left lateral lean                                   Pertinent Vitals/Pain Pain  Assessment: 0-10 Pain Location: Patient reports pain in right side ( rib area) with mobility Pain Descriptors / Indicators: Grimacing Pain Intervention(s): Limited activity within patient's tolerance    Home Living Family/patient expects to be discharged to:: Private residence Living Arrangements: Alone Available Help at Discharge: Friend(s) Type of Home: House Home Access: Level entry     Home Layout: One level Home Equipment: Environmental consultant - 4 wheels      Prior Function Level of Independence: Independent with assistive device(s)               Hand Dominance        Extremity/Trunk Assessment   Upper Extremity Assessment Upper Extremity Assessment: Generalized weakness    Lower Extremity Assessment Lower Extremity Assessment: Generalized weakness       Communication   Communication: No difficulties  Cognition Arousal/Alertness: Lethargic Behavior During Therapy: WFL for tasks assessed/performed Overall Cognitive Status: Difficult to assess                                 General Comments: patient lethargic       General Comments      Exercises  Patient performed sitting balance activities with assist and cues for positioning. Cues to lean to her right and forward for upright posture. Cues to  rest right UE onto pillows for proper upright positioning in sitting. Patient able to return demo 25%.    Assessment/Plan    PT Assessment Patient needs continued PT services  PT Problem List Decreased strength;Decreased activity tolerance;Decreased balance;Decreased cognition;Decreased mobility       PT Treatment Interventions Therapeutic activities;Balance training    PT Goals (Current goals can be found in the Care Plan section)  Acute Rehab PT Goals Patient Stated Goal: to return to walking with walker, return home.  PT Goal Formulation: With patient Time For Goal Achievement: 06/25/17 Potential to Achieve Goals: Fair    Frequency Min 2X/week    Barriers to discharge Decreased caregiver support      Co-evaluation               AM-PAC PT "6 Clicks" Daily Activity  Outcome Measure Difficulty turning over in bed (including adjusting bedclothes, sheets and blankets)?: Unable Difficulty moving from lying on back to sitting on the side of the bed? : Unable Difficulty sitting down on and standing up from a chair with arms (e.g., wheelchair, bedside commode, etc,.)?: Unable Help needed moving to and from a bed to chair (including a wheelchair)?: Total Help needed walking in hospital room?: Total Help needed climbing 3-5 steps with a railing? : Total 6 Click Score: 6    End of Session   Activity Tolerance: Patient limited by lethargy;Patient limited by pain;Patient limited by fatigue Patient left: in bed;with bed alarm set;with call bell/phone within reach Nurse Communication: Mobility status PT Visit Diagnosis: History of falling (Z91.81);Muscle weakness (generalized) (M62.81)    Time: 1937-9024 PT Time Calculation (min) (ACUTE ONLY): 19 min   Charges:   PT Evaluation $PT Eval Moderate Complexity: 1 Mod PT Treatments $Therapeutic Activity: 8-22 mins   PT G Codes:        Mehran Guderian, PT, GCS 06/11/17,3:27 PM

## 2017-06-11 NOTE — Progress Notes (Signed)
OT Cancellation Note  Patient Details Name: Theresa Mills MRN: [id number] DOB: 09/21/1950   Cancelled Treatment:    Reason Eval/Treat Not Completed: Other (comment). Order received, chart reviewed. Spoke with PT who recommends holding to evaluate until tomorrow, as pt fatigued and confused this afternoon. Will re-attempt next date as medically appropriate.   Jeni Salles, MPH, MS, OTR/L ascom [phone] 06/11/17, 3:38 PM

## 2017-06-11 NOTE — Progress Notes (Signed)
   06/11/17 1005  Clinical Encounter Type  Visited With Patient  Visit Type Initial  Referral From Nurse  Consult/Referral To Chaplain  Spiritual Encounters  Spiritual Needs Brochure;Other (Comment)   [facility] received an OR to educate PT on AD. PT appeared to be very sleepy but responded to [facility] when [facility] stated why he was visiting. [place] left AD booklet with PT and will follow up if PT wants to complete AD.

## 2017-06-11 NOTE — Progress Notes (Signed)
Pharmacy Electrolyte Monitoring Consult:  Pharmacy consulted to assist in monitoring and replacing electrolytes in this 67 y.o. female admitted on 06/10/2017 with [place] consulted to manage phosphorous   Labs:  Sodium (mmol/L)  Date Value  06/11/2017 148 (H)  01/09/2017 149 (H)  01/12/2014 140   Potassium (mmol/L)  Date Value  06/11/2017 3.0 (L)  01/12/2014 4.0   Magnesium (mg/dL)  Date Value  06/11/2017 2.0  01/12/2014 1.8   Phosphorus (mg/dL)  Date Value  06/11/2017 1.9 (L)   Calcium (mg/dL)  Date Value  06/11/2017 8.9   Calcium, Total (mg/dL)  Date Value  01/12/2014 8.9   Albumin (g/dL)  Date Value  06/11/2017 3.3 (L)  01/09/2017 4.2  01/12/2014 3.2 (L)    Assessment/Plan: Phos 1.9, K 3.0 - Will replace with KPhos 15 mmol IV x1 and recheck in AM. This will also provide ~22.5 mEq of potassium.  Pt is lethargic at this time so will replace with IV.   Pharmacy will continue to follow.   Rocky Morel 06/11/2017 2:42 PM

## 2017-06-12 LAB — BASIC METABOLIC PANEL
ANION GAP: 5 (ref 5–15)
BUN: 43 mg/dL — ABNORMAL HIGH (ref 6–20)
CALCIUM: 8.7 mg/dL — AB (ref 8.9–10.3)
CO2: 27 mmol/L (ref 22–32)
Chloride: 116 mmol/L — ABNORMAL HIGH (ref 101–111)
Creatinine, Ser: 0.46 mg/dL (ref 0.44–1.00)
GFR calc non Af Amer: >60 mL/min (ref >60)
Glucose, Bld: 136 mg/dL — ABNORMAL HIGH (ref 65–99)
Potassium: 3.4 mmol/L — ABNORMAL LOW (ref 3.5–5.1)
SODIUM: 148 mmol/L — AB (ref 135–145)

## 2017-06-12 LAB — CBC
HCT: 43.6 % (ref 35.0–47.0)
HEMOGLOBIN: 14.6 g/dL (ref 12.0–16.0)
MCH: 31 pg (ref 26.0–34.0)
MCHC: 33.5 g/dL (ref 32.0–36.0)
MCV: 92.6 fL (ref 80.0–100.0)
PLATELETS: 161 10*3/uL (ref 150–440)
RBC: 4.71 MIL/uL (ref 3.80–5.20)
RDW: 12.8 % (ref 11.5–14.5)
WBC: 9.7 10*3/uL (ref 3.6–11.0)

## 2017-06-12 LAB — GLUCOSE, CAPILLARY
GLUCOSE-CAPILLARY: 131 mg/dL — AB (ref 65–99)
Glucose-Capillary: 173 mg/dL — ABNORMAL HIGH (ref 65–99)
Glucose-Capillary: 172 mg/dL — ABNORMAL HIGH (ref 65–99)
Glucose-Capillary: 89 mg/dL (ref 65–99)

## 2017-06-12 LAB — PHOSPHORUS: PHOSPHORUS: 2.4 mg/dL — AB (ref 2.5–4.6)

## 2017-06-12 LAB — CK: CK TOTAL: 789 U/L — AB (ref 38–234)

## 2017-06-12 MED ORDER — POTASSIUM & SODIUM PHOSPHATES 280-160-250 MG PO PACK
2.0000 | PACK | ORAL | Status: AC
  Administered 2017-06-12 (×2): 2 via ORAL
  Filled 2017-06-12 (×2): qty 2

## 2017-06-12 NOTE — Consult Note (Addendum)
[facility] Nurse wound consult note Reason for Consult: Consult requested for sacrum.  Pt was found down for an unknown period of time prior to admission. Wound type: Sacrum with patchy areas of deep tissue injury; 1X3cm, dark purple skin beginning to peel in patchy areas. Right elbow with partial thickness abrasion; 5X4X.1cm, dark red with small amt blood-tinged drainage, no odor Right hip with stage 2 pressure injury; 5X2X.1cm, loose peeling skin surrounding red moist wound bed, no odor Pressure Injury POA: Yes Dressing procedure/placement/frequency: Foam dressing to protect and promote healing.  Discussed plan of care with patient. Please re-consult if further assistance is needed.  Thank-you,  Julien Girt MSN, [place], [place], [place], [place]

## 2017-06-12 NOTE — Progress Notes (Signed)
[facility] NAME: Theresa Mills    MR#:  [id number]  DATE OF BIRTH:  January 18, 1951  SUBJECTIVE:   Patient seen and evaluated today Awake alert and responds to verbal commands Has periorbital ecchymosis bilaterally No chest pain No shortness of breath  REVIEW OF SYSTEMS:    Review of Systems  Constitutional: Positive for malaise/fatigue.  Eyes: Negative.   Respiratory: Negative.   Cardiovascular: Negative.   Gastrointestinal: Negative.   Genitourinary: Negative.   Musculoskeletal: Positive for falls.  Neurological: Positive for tremors.  Endo/Heme/Allergies: Bruises/bleeds easily.  Psychiatric/Behavioral: Negative.   Has bruises around the orbits    DRUG ALLERGIES:   Allergies  Allergen Reactions  . Codeine Other (See Comments)    HALLUCINATIONS  . Ephadrene [Cholestatin] Other (See Comments)    HYPER AND NERVOUS  . Valium [Diazepam] Other (See Comments)    OVERLY SENSITIVE/TO STRONG    VITALS:  Blood pressure 121/79, pulse 70, temperature 98.3 F (36.8 C), temperature source Oral, resp. rate 18, height 5\' 4"  (1.626 m), weight 90.3 kg (199 lb 1.6 oz), SpO2 96 %.  PHYSICAL EXAMINATION:   Physical Exam  GENERAL:  67 y.o.-year-old patient lying in the bed  EYES: Pupils equal, round, reactive to light and accommodation. No scleral icterus. Extraocular muscles intact.  HEENT: Head normocephalic. Oropharynx and nasopharynx clear.  Ecchymosis around the orbits in both eyes along with bruises over the frontal area of the head Ecchymosis over the left side of the face too NECK:  Supple, no jugular venous distention. No thyroid enlargement, no tenderness.  LUNGS: Normal breath sounds bilaterally, no wheezing, rales, rhonchi. No use of accessory muscles of respiration.  CARDIOVASCULAR: S1, S2 normal. No murmurs, rubs, or gallops.  ABDOMEN: Soft, nontender, nondistended. Bowel sounds present. No organomegaly or mass.   EXTREMITIES: No cyanosis, clubbing or edema b/l.    NEUROLOGIC: Arousable to loud verbal commands Not completely oriented Moves all extremities PSYCHIATRIC: Could not be assessed SKIN: No obvious rash Periorbital ecchymosis bilaterally Ecchymosis and bruise over the left side of the face secondary to fall  LABORATORY PANEL:   CBC Recent Labs  Lab 06/12/17 0337  WBC 9.7  HGB 14.6  HCT 43.6  PLT 161   ------------------------------------------------------------------------------------------------------------------ Chemistries  Recent Labs  Lab 06/11/17 0517 06/11/17 1117 06/12/17 0337  NA 148*  --  148*  K 3.0*  --  3.4*  CL 118*  --  116*  CO2 19*  --  27  GLUCOSE 145*  --  136*  BUN 59*  --  43*  CREATININE 0.79  --  0.46  CALCIUM 8.9  --  8.7*  MG  --  2.0  --   AST 69*  --   --   ALT 8*  --   --   ALKPHOS 37*  --   --   BILITOT 2.0*  --   --    ------------------------------------------------------------------------------------------------------------------  Cardiac Enzymes Recent Labs  Lab 06/11/17 1117  TROPONINI 0.04*   ------------------------------------------------------------------------------------------------------------------  RADIOLOGY:  Dg Pelvis 1-2 Views  Result Date: 06/10/2017 CLINICAL DATA:  Acute pelvic pain following fall. Initial encounter. EXAM: PELVIS - 1-2 VIEW COMPARISON:  04/10/2013 radiographs FINDINGS: No acute fracture, subluxation or dislocation. Degenerative changes in the hips and LOWER lumbar spine again noted. No focal bony lesions are present. IMPRESSION: No acute abnormality. Degenerative changes. Electronically Signed   By: Margarette Canada M.D.   On: 06/10/2017 21:07   Dg Knee 2  Views Left  Result Date: 06/10/2017 CLINICAL DATA:  Acute LEFT knee pain following fall. Initial encounter. EXAM: LEFT KNEE - 1-2 VIEW COMPARISON:  None. FINDINGS: No acute fracture, subluxation or dislocation. No knee effusion. Moderate  degenerative changes in the MEDIAL and LATERAL compartments and severe degenerative changes in the patellofemoral compartment noted. No bony lesions are present. IMPRESSION: 1. No acute abnormality 2. Tricompartmental degenerative changes, severe in the patellofemoral compartment. Electronically Signed   By: Margarette Canada M.D.   On: 06/10/2017 21:10   Dg Knee 2 Views Right  Result Date: 06/10/2017 CLINICAL DATA:  Acute RIGHT knee pain following fall. Initial encounter. EXAM: RIGHT KNEE - 1-2 VIEW COMPARISON:  None. FINDINGS: No acute fracture or dislocation. Severe tricompartmental degenerative changes are present. No definite knee effusion noted. IMPRESSION: No evidence of acute abnormality. Severe tricompartmental degenerative changes. Electronically Signed   By: Margarette Canada M.D.   On: 06/10/2017 21:09   Ct Head Wo Contrast  Result Date: 06/10/2017 CLINICAL DATA:  Fall at home with head, face, and cervical spine injury. Hematoma above left eye. Unknown loss of consciousness. EXAM: CT HEAD WITHOUT CONTRAST CT MAXILLOFACIAL WITHOUT CONTRAST CT CERVICAL SPINE WITHOUT CONTRAST TECHNIQUE: Multidetector CT imaging of the head, cervical spine, and maxillofacial structures were performed using the standard protocol without intravenous contrast. Multiplanar CT image reconstructions of the cervical spine and maxillofacial structures were also generated. COMPARISON:  Brain MRI 04/03/2014, head CT 01/12/2014 FINDINGS: CT HEAD FINDINGS Brain: Patient's head is tilted in the scanner. Generalized atrophy with mild progression from prior exam. No acute hemorrhage. No mass effect or midline shift. No evidence of acute ischemia. No hydrocephalus. Vascular: No hyperdense vessel or unexpected calcification. Skull: Again seen hyperostosis.  No skull fracture. Other: Left frontal/supraorbital scalp hematoma. CT MAXILLOFACIAL FINDINGS Osseous: Nasal bone, zygomatic arches, nasal bones are intact. The temporomandibular joints are  congruent. No fracture of the pterygoid plates. Periodontal disease. Orbits: No acute orbital fracture. Both orbits and globes are intact. Bilateral cataract resection. Left periorbital hematoma. Sinuses: Cortical buckling of the anterior and lateral walls of the left maxillary sinus, new from prior exam suspicious for nondisplaced fracture. No significant hemosinus. Small fluid level in the right maxillary sinus without right maxillary sinus fracture. Small fluid levels within both sphenoid sinuses. Mastoid air cells are clear. Soft tissues: Soft tissue edema about the left and right cheek. Left periorbital soft tissue hematomas. CT CERVICAL SPINE FINDINGS Alignment: Broad-based levo scoliotic curvature. No traumatic subluxation. Skull base and vertebrae: No acute fracture of the cervical spine. Mild loss of height of T3 vertebra, better assessed on dedicated thoracic spine CT. Vertebral body heights are maintained. The dens and skull base are intact. Soft tissues and spinal canal: No prevertebral fluid or swelling. No visible canal hematoma. Disc levels: Normal for age disc space narrowing and endplate spurring. Mild scattered facet arthropathy. Upper chest: No acute finding. Other: Mild carotid calcifications. IMPRESSION: 1. No acute intracranial abnormality. No skull fracture. Generalized atrophy with mild progression since 2016. 2. Cortical buckling of the frontal and lateral walls of the left maxillary sinus, new from prior exam, suspicious for nondisplaced fracture, however no sinus fluid level or hemosinus. 3. Left periorbital soft tissue hematomas.  No orbital fracture. 4. Expected for age degenerative change in the cervical spine. No acute fracture or subluxation. Electronically Signed   By: Jeb Levering M.D.   On: 06/10/2017 21:27   Ct Cervical Spine Wo Contrast  Result Date: 06/10/2017 CLINICAL DATA:  Fall at  home with head, face, and cervical spine injury. Hematoma above left eye. Unknown loss  of consciousness. EXAM: CT HEAD WITHOUT CONTRAST CT MAXILLOFACIAL WITHOUT CONTRAST CT CERVICAL SPINE WITHOUT CONTRAST TECHNIQUE: Multidetector CT imaging of the head, cervical spine, and maxillofacial structures were performed using the standard protocol without intravenous contrast. Multiplanar CT image reconstructions of the cervical spine and maxillofacial structures were also generated. COMPARISON:  Brain MRI 04/03/2014, head CT 01/12/2014 FINDINGS: CT HEAD FINDINGS Brain: Patient's head is tilted in the scanner. Generalized atrophy with mild progression from prior exam. No acute hemorrhage. No mass effect or midline shift. No evidence of acute ischemia. No hydrocephalus. Vascular: No hyperdense vessel or unexpected calcification. Skull: Again seen hyperostosis.  No skull fracture. Other: Left frontal/supraorbital scalp hematoma. CT MAXILLOFACIAL FINDINGS Osseous: Nasal bone, zygomatic arches, nasal bones are intact. The temporomandibular joints are congruent. No fracture of the pterygoid plates. Periodontal disease. Orbits: No acute orbital fracture. Both orbits and globes are intact. Bilateral cataract resection. Left periorbital hematoma. Sinuses: Cortical buckling of the anterior and lateral walls of the left maxillary sinus, new from prior exam suspicious for nondisplaced fracture. No significant hemosinus. Small fluid level in the right maxillary sinus without right maxillary sinus fracture. Small fluid levels within both sphenoid sinuses. Mastoid air cells are clear. Soft tissues: Soft tissue edema about the left and right cheek. Left periorbital soft tissue hematomas. CT CERVICAL SPINE FINDINGS Alignment: Broad-based levo scoliotic curvature. No traumatic subluxation. Skull base and vertebrae: No acute fracture of the cervical spine. Mild loss of height of T3 vertebra, better assessed on dedicated thoracic spine CT. Vertebral body heights are maintained. The dens and skull base are intact. Soft tissues  and spinal canal: No prevertebral fluid or swelling. No visible canal hematoma. Disc levels: Normal for age disc space narrowing and endplate spurring. Mild scattered facet arthropathy. Upper chest: No acute finding. Other: Mild carotid calcifications. IMPRESSION: 1. No acute intracranial abnormality. No skull fracture. Generalized atrophy with mild progression since 2016. 2. Cortical buckling of the frontal and lateral walls of the left maxillary sinus, new from prior exam, suspicious for nondisplaced fracture, however no sinus fluid level or hemosinus. 3. Left periorbital soft tissue hematomas.  No orbital fracture. 4. Expected for age degenerative change in the cervical spine. No acute fracture or subluxation. Electronically Signed   By: Jeb Levering M.D.   On: 06/10/2017 21:27   Ct Thoracic Spine Wo Contrast  Result Date: 06/10/2017 CLINICAL DATA:  Fall.  Back pain EXAM: CT THORACIC SPINE WITHOUT CONTRAST TECHNIQUE: Multidetector CT images of the thoracic were obtained using the standard protocol without intravenous contrast. COMPARISON:  None. FINDINGS: Alignment: Normal alignment.  Moderate dextroscoliosis Vertebrae: Mild compression fracture superior endplate of T3, probably acute. No prior studies for comparison. No other fracture or mass Paraspinal and other soft tissues: Negative Disc levels: Multilevel disc degeneration throughout the thoracic spine with flowing osteophytes bilaterally, left greater than right. IMPRESSION: Mild compression fracture of T3, probably acute Thoracic scoliosis and multilevel degenerative change with extensive osteophyte formation. Electronically Signed   By: Franchot Gallo M.D.   On: 06/10/2017 21:14   Ct Lumbar Spine Wo Contrast  Result Date: 06/10/2017 CLINICAL DATA:  Fall.  Back pain. EXAM: CT LUMBAR SPINE WITHOUT CONTRAST TECHNIQUE: Multidetector CT imaging of the lumbar spine was performed without intravenous contrast administration. Multiplanar CT image  reconstructions were also generated. COMPARISON:  None. FINDINGS: Segmentation: Normal Alignment: 10 mm anterolisthesis L5-S1 due to chronic bilateral pars defects of L5.  Remaining alignment normal. Moderate levoscoliosis at L2-3 Vertebrae: Mild compression deformity of T12 of indeterminate age however no acute fracture line is identified suggesting this may be chronic. Paraspinal and other soft tissues: Mild atherosclerotic disease. No retroperitoneal mass or hematoma. Disc levels: L1-2: Mild disc degeneration and spurring L2-3: Disc degeneration and spurring. Large right lateral osteophyte. Mild right foraminal narrowing L3-4: Mild disc degeneration and mild facet degeneration without stenosis L4-5: Mild disc and mild facet degeneration L5-S1: Chronic bilateral pars defects of L5 with grade 1 anterolisthesis 10 mm. No significant foraminal stenosis. IMPRESSION: Mild compression fracture of T12, probably chronic. Chronic bilateral pars defects of L5 with 10 mm anterolisthesis L5-S1. Electronically Signed   By: Franchot Gallo M.D.   On: 06/10/2017 21:20   Dg Chest Portable 1 View  Result Date: 06/10/2017 CLINICAL DATA:  Acute fall and pain. EXAM: PORTABLE CHEST 1 VIEW COMPARISON:  03/04/2014 radiograph FINDINGS: The cardiomediastinal silhouette is unchanged. There is no evidence of focal airspace disease, pulmonary edema, suspicious pulmonary nodule/mass, pleural effusion, or pneumothorax. There appear to be fractures of the RIGHT fourth through eighth ribs. No other acute bony abnormalities are noted. A thoracic scoliosis is again identified. IMPRESSION: Apparent fractures of the RIGHT fourth through eighth ribs. No pleural effusion or pneumothorax. Electronically Signed   By: Margarette Canada M.D.   On: 06/10/2017 21:13   Dg Shoulder Left  Result Date: 06/10/2017 CLINICAL DATA:  Acute LEFT shoulder pain following fall. Initial encounter. EXAM: LEFT SHOULDER - 2+ VIEW COMPARISON:  03/04/2014 chest radiographs  FINDINGS: No acute fracture, subluxation or dislocation. Glenohumeral joint degenerative changes are present. No suspicious focal bony lesions are identified. IMPRESSION: No evidence of acute abnormality. Degenerative changes. Electronically Signed   By: Margarette Canada M.D.   On: 06/10/2017 21:14   Ct Maxillofacial Wo Contrast  Result Date: 06/10/2017 CLINICAL DATA:  Fall at home with head, face, and cervical spine injury. Hematoma above left eye. Unknown loss of consciousness. EXAM: CT HEAD WITHOUT CONTRAST CT MAXILLOFACIAL WITHOUT CONTRAST CT CERVICAL SPINE WITHOUT CONTRAST TECHNIQUE: Multidetector CT imaging of the head, cervical spine, and maxillofacial structures were performed using the standard protocol without intravenous contrast. Multiplanar CT image reconstructions of the cervical spine and maxillofacial structures were also generated. COMPARISON:  Brain MRI 04/03/2014, head CT 01/12/2014 FINDINGS: CT HEAD FINDINGS Brain: Patient's head is tilted in the scanner. Generalized atrophy with mild progression from prior exam. No acute hemorrhage. No mass effect or midline shift. No evidence of acute ischemia. No hydrocephalus. Vascular: No hyperdense vessel or unexpected calcification. Skull: Again seen hyperostosis.  No skull fracture. Other: Left frontal/supraorbital scalp hematoma. CT MAXILLOFACIAL FINDINGS Osseous: Nasal bone, zygomatic arches, nasal bones are intact. The temporomandibular joints are congruent. No fracture of the pterygoid plates. Periodontal disease. Orbits: No acute orbital fracture. Both orbits and globes are intact. Bilateral cataract resection. Left periorbital hematoma. Sinuses: Cortical buckling of the anterior and lateral walls of the left maxillary sinus, new from prior exam suspicious for nondisplaced fracture. No significant hemosinus. Small fluid level in the right maxillary sinus without right maxillary sinus fracture. Small fluid levels within both sphenoid sinuses. Mastoid  air cells are clear. Soft tissues: Soft tissue edema about the left and right cheek. Left periorbital soft tissue hematomas. CT CERVICAL SPINE FINDINGS Alignment: Broad-based levo scoliotic curvature. No traumatic subluxation. Skull base and vertebrae: No acute fracture of the cervical spine. Mild loss of height of T3 vertebra, better assessed on dedicated thoracic spine CT. Vertebral body heights  are maintained. The dens and skull base are intact. Soft tissues and spinal canal: No prevertebral fluid or swelling. No visible canal hematoma. Disc levels: Normal for age disc space narrowing and endplate spurring. Mild scattered facet arthropathy. Upper chest: No acute finding. Other: Mild carotid calcifications. IMPRESSION: 1. No acute intracranial abnormality. No skull fracture. Generalized atrophy with mild progression since 2016. 2. Cortical buckling of the frontal and lateral walls of the left maxillary sinus, new from prior exam, suspicious for nondisplaced fracture, however no sinus fluid level or hemosinus. 3. Left periorbital soft tissue hematomas.  No orbital fracture. 4. Expected for age degenerative change in the cervical spine. No acute fracture or subluxation. Electronically Signed   By: Jeb Levering M.D.   On: 06/10/2017 21:27     ASSESSMENT AND PLAN:  67 year old female patient with history of anxiety disorder, diabetes mellitus, GERD, hypertension, hyperlipidemia, osteoarthritis, Parkinson's disease, sleep apnea presented to the emergency room for fall and rib fractures and a facial trauma .  -Hypernatremia improved IV fluid hydration  follow-up sodium level  -Hypokalemia resolved Replace potassium  -Fall and gait instability PT and OT evaluation  -Hypophosphatemia  replace phosphorus  -Periorbital soft tissue hematoma bilaterally secondary to fall resolving slowly Monitor closely  -Elevated troponin secondary to rhabdomyolysis  -Diabetes mellitus type 2 sliding scale  coverage with insulin  -Dementia due to Parkinson's disease supportive care  -Rhabdomyolysis improving  All the records are reviewed and case discussed with Care Management/Social Worker. Management plans discussed with the patient, family and they are in agreement.  CODE STATUS: Full code  DVT Prophylaxis: SCDs  TOTAL TIME TAKING CARE OF THIS PATIENT: 33 minutes.   POSSIBLE D/C IN 2 to 3 DAYS, DEPENDING ON CLINICAL CONDITION.  Saundra Shelling M.D on 06/12/2017 at 2:13 PM  Between 7am to 6pm - Pager - [phone]  After 6pm go to [email or web address] - password [email or web address] [facility]  Office  [phone]  CC: Primary care physician; Christie Nottingham, PA  Note: This dictation was prepared with Dragon dictation along with smaller phrase technology. Any transcriptional errors that result from this process are unintentional.

## 2017-06-12 NOTE — Progress Notes (Signed)
Pharmacy Electrolyte Monitoring Consult:  Pharmacy consulted to assist in monitoring and replacing electrolytes in this 67 y.o. female admitted on 06/10/2017 with [place] consulted to manage phosphorous   Labs:  Sodium (mmol/L)  Date Value  06/12/2017 148 (H)  01/09/2017 149 (H)  01/12/2014 140   Potassium (mmol/L)  Date Value  06/12/2017 3.4 (L)  01/12/2014 4.0   Magnesium (mg/dL)  Date Value  06/11/2017 2.0  01/12/2014 1.8   Phosphorus (mg/dL)  Date Value  06/12/2017 2.4 (L)   Calcium (mg/dL)  Date Value  06/12/2017 8.7 (L)   Calcium, Total (mg/dL)  Date Value  01/12/2014 8.9   Albumin (g/dL)  Date Value  06/11/2017 3.3 (L)  01/09/2017 4.2  01/12/2014 3.2 (L)    Assessment/Plan: Phos 1.9, K 3.0 - Will replace with KPhos 15 mmol IV x1 and recheck in AM. This will also provide ~22.5 mEq of potassium.  Pt is lethargic at this time so will replace with IV.   06/12/17 03:37 phos 2.4 Will give phos-nak 2 packet po Q4H x 2 doses (will also supply approximately 7 mEq potassium per packet, which should correct K 3.4). Will check phos tomorrow with AM labs. Recommend BMP and magnesium tomorrow with AM labs as well.  Pharmacy will continue to follow.   Laural Benes, Pharm.D., BCPS Clinical Pharmacist 06/12/2017 7:27 AM

## 2017-06-12 NOTE — Progress Notes (Signed)
Advanced care plan.  Purpose of the Encounter: CODE STATUS  Parties in Attendance:Patient Patient's Decision Capacity:Good Subjective/Patient's story: Presented for fall and bruises Objective/Medical story Has rhabdomyolysis Needs IV fluid hydration Goals of care determination:  Advanced directives and plan of care discussed with the patient Patient does not want any cardiac resuscitation, intubation and ventilator if the need arises Goals met CODE STATUS: DNR Time spent discussing advanced care planning: 16 minutes

## 2017-06-12 NOTE — Plan of Care (Signed)
  Problem: Clinical Measurements: Goal: Will remain free from infection Outcome: Progressing   Problem: Clinical Measurements: Goal: Cardiovascular complication will be avoided Outcome: Progressing   Problem: Pain Managment: Goal: General experience of comfort will improve Outcome: Progressing   

## 2017-06-12 NOTE — Progress Notes (Signed)
Patient resting in the bed at this time, alert and oriented, denies any pain at this time, vss, patient turned and repositioned , external  catheter in place ,

## 2017-06-12 NOTE — Evaluation (Signed)
Occupational Therapy Evaluation Patient Details Name: Theresa Mills MRN: [id number] DOB: 1950/12/11 Today's Date: 06/12/2017    History of Present Illness Patient is a 67 year old female who lives alone and was found at home down after fall. Patient reports she uses rollator at baseline for mobility and has the assistance of a friend for needs.     Clinical Impression   Pt seen for OT evaluation this date. Prior to admission, pt was modified independent using a walker (unable to confirm type, pt somewhat confused this date), indep with basic ADL, and required assist from [facility] bus or a friend for transportation. Pt endorses 2 big falls in past 12 months, 1 leading to this admission. Pt presents with significant facial bruising secondary to the fall, decreased strength, balance, and pain (recently medicated), and impaired cognition. These impairments are significantly impacting her ability to perform self care tasks, requiring max assist for bathing, dressing, and toileting, max +2 for bed mobility. Pt will benefit from skilled OT services to address noted impairments and functional deficits in order to maximize return to PLOF and minimize risk of future falls and caregiver burden. Recommend STR following discharge.    Follow Up Recommendations  SNF    Equipment Recommendations  3 in 1 bedside commode    Recommendations for Other Services       Precautions / Restrictions Precautions Precautions: Fall Restrictions Weight Bearing Restrictions: No      Mobility Bed Mobility Overal bed mobility: Needs Assistance Bed Mobility: Supine to Sit;Sit to Supine     Supine to sit: +2 for physical assistance;Max assist Sit to supine: +2 for physical assistance;Max assist      Transfers                 General transfer comment: not attempted, continue to assess    Balance Overall balance assessment: Needs assistance Sitting-balance support: Bilateral upper extremity  supported Sitting balance-Leahy Scale: Poor                                     ADL either performed or assessed with clinical judgement   ADL Overall ADL's : Needs assistance/impaired                                       General ADL Comments: pt requires max assist for UB and LB bathing, dressing, and toileting tasks; set up for self feeding and grooming with difficulty noted secondary to tremors     Vision Baseline Vision/History: Wears glasses Wears Glasses: Reading only Patient Visual Report: No change from baseline Vision Assessment?: No apparent visual deficits     Perception     Praxis      Pertinent Vitals/Pain Pain Assessment: No/denies pain Pain Intervention(s): Premedicated before session;Monitored during session     Hand Dominance     Extremity/Trunk Assessment Upper Extremity Assessment Upper Extremity Assessment: Generalized weakness(postural tremors bilaterally during MMT, no tremors noted at rest; <40 degrees AROM shoulder flexion, grip 4/5 bilaterally)   Lower Extremity Assessment Lower Extremity Assessment: Generalized weakness       Communication Communication Communication: No difficulties   Cognition Arousal/Alertness: Lethargic Behavior During Therapy: WFL for tasks assessed/performed Overall Cognitive Status: No family/caregiver present to determine baseline cognitive functioning  General Comments: confused, oriented to self and generally to situation only, difficulty with problem solving, follows simple commands   General Comments       Exercises     Shoulder Instructions      Home Living Family/patient expects to be discharged to:: Private residence Living Arrangements: Alone Available Help at Discharge: Friend(s) Type of Home: House Home Access: Level entry     [place]ul: One level     Bathroom Shower/Tub: Occupational psychologist:  Handicapped height     Home Equipment: Environmental consultant - 4 wheels;Walker - 2 wheels;Grab bars - tub/shower;Shower seat - built in          Prior Functioning/Environment Level of Independence: Needs assistance  Gait / Transfers Assistance Needed: pt reports using 4WRW or 2WW depending on how she felt ADL's / Homemaking Assistance Needed: Pt indep with basic ADL, reports "sometimes have a hard time" managing medications (takes 9 medications across 4x/day), does not drive (takes [employer] or friend takes her)   Comments: 2 falls in past 12 months        OT Problem List: Decreased strength;Decreased knowledge of use of DME or AE;Decreased coordination;Decreased activity tolerance;Decreased cognition;Impaired UE functional use;Impaired balance (sitting and/or standing)      OT Treatment/Interventions: Self-care/ADL training;Balance training;Therapeutic exercise;Therapeutic activities;Energy conservation;DME and/or AE instruction;Patient/family education;Cognitive remediation/compensation    OT Goals(Current goals can be found in the care plan section) Acute Rehab OT Goals Patient Stated Goal: to return to walking with walker, return home.  OT Goal Formulation: With patient Time For Goal Achievement: 06/26/17 Potential to Achieve Goals: Good ADL Goals Pt Will Perform Lower Body Dressing: with min assist;with mod assist;sit to/from stand;with adaptive equipment Pt Will Transfer to Toilet: with mod assist;with max assist;bedside commode;ambulating(LRAD for amb)  OT Frequency: Min 1X/week   Barriers to D/C: Decreased caregiver support          Co-evaluation              AM-PAC PT "6 Clicks" Daily Activity     Outcome Measure Help from another person eating meals?: None Help from another person taking care of personal grooming?: None Help from another person toileting, which includes using toliet, bedpan, or urinal?: A Lot Help from another person bathing (including washing, rinsing,  drying)?: A Lot Help from another person to put on and taking off regular upper body clothing?: A Lot Help from another person to put on and taking off regular lower body clothing?: A Lot 6 Click Score: 16   End of Session    Activity Tolerance: Patient limited by lethargy Patient left: in bed;with call bell/phone within reach;with bed alarm set  OT Visit Diagnosis: Other abnormalities of gait and mobility (R26.89);Repeated falls (R29.6);Muscle weakness (generalized) (M62.81)                Time: 1610-9604 OT Time Calculation (min): 33 min Charges:  OT General Charges $OT Visit: 1 Visit OT Evaluation $OT Eval High Complexity: 1 High  Jeni Salles, MPH, MS, OTR/L ascom [phone] 06/12/17, 10:01 AM

## 2017-06-13 LAB — GLUCOSE, CAPILLARY
GLUCOSE-CAPILLARY: 109 mg/dL — AB (ref 65–99)
GLUCOSE-CAPILLARY: 135 mg/dL — AB (ref 65–99)
Glucose-Capillary: 157 mg/dL — ABNORMAL HIGH (ref 65–99)
Glucose-Capillary: 105 mg/dL — ABNORMAL HIGH (ref 65–99)

## 2017-06-13 LAB — PHOSPHORUS: PHOSPHORUS: 2.7 mg/dL (ref 2.5–4.6)

## 2017-06-13 LAB — BASIC METABOLIC PANEL
ANION GAP: 5 (ref 5–15)
BUN: 36 mg/dL — ABNORMAL HIGH (ref 6–20)
CHLORIDE: 110 mmol/L (ref 101–111)
CO2: 31 mmol/L (ref 22–32)
Calcium: 8.5 mg/dL — ABNORMAL LOW (ref 8.9–10.3)
Creatinine, Ser: 0.45 mg/dL (ref 0.44–1.00)
GFR calc Af Amer: >60 mL/min (ref >60)
Glucose, Bld: 116 mg/dL — ABNORMAL HIGH (ref 65–99)
POTASSIUM: 3.7 mmol/L (ref 3.5–5.1)
SODIUM: 146 mmol/L — AB (ref 135–145)

## 2017-06-13 LAB — MAGNESIUM: MAGNESIUM: 2 mg/dL (ref 1.7–2.4)

## 2017-06-13 MED ORDER — POTASSIUM & SODIUM PHOSPHATES 280-160-250 MG PO PACK
2.0000 | PACK | Freq: Two times a day (BID) | ORAL | Status: DC
  Administered 2017-06-13 (×2): 2 via ORAL
  Filled 2017-06-13 (×3): qty 2

## 2017-06-13 NOTE — Progress Notes (Signed)
Physical Therapy Treatment Patient Details Name: Theresa Mills MRN: [id number] DOB: [birth year] Today's Date: 06/13/2017    History of Present Illness Patient is a 67 year old female who lives alone and was found at home down after fall. Patient reports she uses rollator at baseline for mobility and has the assistance of a friend for needs.      PT Comments    Pt lethargic, but agreeable to PT. Pt very slow to participate with supine bed exercises, but understands instruction well; increased rest or processing time between repetitions (difficult to tell). Requires assist throughout session with RLE weaker/less range than LLE, but both limited. Continue PT to progress participation, strength and endurance to improve ability to assist with functional mobility.    Follow Up Recommendations  SNF     Equipment Recommendations       Recommendations for Other Services       Precautions / Restrictions Precautions Precautions: Fall Restrictions Weight Bearing Restrictions: No    Mobility  Bed Mobility               General bed mobility comments: Not tested due to lethargy and difficulty with movement  Transfers                    Ambulation/Gait                 Stairs             Wheelchair Mobility    Modified Rankin (Stroke Patients Only)       Balance                                            Cognition Arousal/Alertness: Lethargic Behavior During Therapy: WFL for tasks assessed/performed Overall Cognitive Status: Within Functional Limits for tasks assessed                                        Exercises General Exercises - Lower Extremity Ankle Circles/Pumps: AAROM;Both;15 reps;Supine Quad Sets: Strengthening;Both;10 reps;Supine;Other (comment)(poor; difficult, very slow and long time btn reps) Gluteal Sets: Strengthening;Both;10 reps;Supine Short Arc Quad: AAROM;Both;10 reps;Supine Heel Slides:  AAROM;Both;10 reps;Supine Hip ABduction/ADduction: AAROM;Both;10 reps;Supine    General Comments        Pertinent Vitals/Pain Pain Assessment: Faces Faces Pain Scale: Hurts little more Pain Location: RLE with movement Pain Intervention(s): Limited activity within patient's tolerance;Monitored during session    Home Living                      Prior Function            PT Goals (current goals can now be found in the care plan section) Progress towards PT goals: Not progressing toward goals - comment    Frequency    Min 2X/week      PT Plan Current plan remains appropriate    Co-evaluation              AM-PAC PT "6 Clicks" Daily Activity  Outcome Measure  Difficulty turning over in bed (including adjusting bedclothes, sheets and blankets)?: Unable Difficulty moving from lying on back to sitting on the side of the bed? : Unable Difficulty sitting down on and standing up from a chair with arms (e.g., wheelchair,  bedside commode, etc,.)?: Unable Help needed moving to and from a bed to chair (including a wheelchair)?: Total Help needed walking in hospital room?: Total Help needed climbing 3-5 steps with a railing? : Total 6 Click Score: 6    End of Session   Activity Tolerance: Patient limited by fatigue;Patient limited by lethargy;Patient limited by pain Patient left: in bed;with call bell/phone within reach;with bed alarm set   PT Visit Diagnosis: History of falling (Z91.81);Muscle weakness (generalized) (M62.81)     Time: 2820-8138 PT Time Calculation (min) (ACUTE ONLY): 23 min  Charges:  $Therapeutic Exercise: 23-37 mins                    G Codes:        Larae Grooms, PTA 06/13/2017, 12:01 PM

## 2017-06-13 NOTE — NC FL2 (Signed)
[place] LEVEL OF CARE SCREENING TOOL     IDENTIFICATION  Patient Name: Theresa Mills Birthdate: Dec 04, 1950 Sex: female Admission Date (Current Location): 06/10/2017  [place] and [place] Number:  Engineering geologist and Address:  [facility], [address] [place], [place] [address]      Provider Number: [id number]  Attending Physician Name and Address:  Saundra Shelling, MD  Relative Name and Phone Number:  Delrae Sawyers [phone] or [place],Gail Sister [phone]  [phone]     Current Level of Care: Hospital Recommended Level of Care: [place] Prior Approval Number:    Date Approved/Denied:   PASRR Number: [id number]  Discharge Plan: SNF    Current Diagnoses: Patient Active Problem List   Diagnosis Date Noted  . Rib fractures 06/11/2017  . Pressure injury of skin 06/11/2017  . Rhabdomyolysis 06/10/2017  . Elevated troponin 06/10/2017  . Hypernatremia 06/10/2017  . Fall 06/10/2017  . Arthritis 01/08/2017  . Cataracts, bilateral 01/08/2017  . Chickenpox 01/08/2017  . Shingles 01/08/2017  . Depression 01/08/2017  . Diabetes mellitus type 2, uncomplicated ([place]) 54/62/7035  . Hyperlipidemia, unspecified 01/08/2017  . Hypertension 01/08/2017  . Migraine headache 01/08/2017  . Thyroid disease 01/08/2017  . Parkinson disease ([place]) 01/08/2017  . Anxiety, generalized 09/01/2016  . Dementia due to Parkinson's disease without behavioral disturbance ([place]) 09/01/2016  . History of depression 09/01/2016  . Morbid obesity with BMI of 40.0-44.9, adult ([place]) 09/01/2016  . RLS (restless legs syndrome) 09/01/2016    Orientation RESPIRATION BLADDER Height & Weight     Self, Place  Normal Incontinent Weight: 199 lb 1.6 oz (90.3 kg) Height:  5\' 4"  (162.6 cm)  BEHAVIORAL SYMPTOMS/MOOD NEUROLOGICAL BOWEL NUTRITION STATUS      Continent Diet(Regular diet)  AMBULATORY STATUS COMMUNICATION OF NEEDS Skin    Limited Assist Verbally PU Stage and Appropriate Care   PU Stage 2 Dressing: (PRN)                   Personal Care Assistance Level of Assistance  Bathing, Feeding, Dressing Bathing Assistance: Limited assistance Feeding assistance: Limited assistance Dressing Assistance: Limited assistance     Functional Limitations Info  Sight, Hearing, Speech Sight Info: Adequate Hearing Info: Adequate Speech Info: Adequate    SPECIAL CARE FACTORS FREQUENCY  PT (By licensed PT)     PT Frequency: 5x a week              Contractures Contractures Info: Not present    Additional Factors Info  Code Status, Allergies, Psychotropic, Insulin Sliding Scale Code Status Info: DNR Allergies Info: CODEINE, [place], VALIUM DIAZEPAM  Psychotropic Info: citalopram (CELEXA) tablet 40 mg  Insulin Sliding Scale Info: insulin aspart (novoLOG) injection 0-9 Units 3x a day with meals       Current Medications (06/13/2017):  This is the current hospital active medication list Current Facility-Administered Medications  Medication Dose Route Frequency Provider Last Rate Last Dose  . acetaminophen (TYLENOL) tablet 650 mg  650 mg Oral Q6H PRN Lance Coon, MD   650 mg at 06/12/17 0093   Or  . acetaminophen (TYLENOL) suppository 650 mg  650 mg Rectal Q6H PRN Lance Coon, MD      . carbidopa-levodopa (SINEMET CR) 50-200 MG per tablet controlled release 1 tablet  1 tablet Oral TID Lance Coon, MD   1 tablet at 06/13/17 1610  . carbidopa-levodopa (SINEMET IR) 25-250 MG per tablet immediate release 1 tablet  1 tablet Oral  QID Lance Coon, MD   1 tablet at 06/13/17 1428  . citalopram (CELEXA) tablet 40 mg  40 mg Oral Daily Lance Coon, MD   40 mg at 06/13/17 1013  . feeding supplement (ENSURE ENLIVE) (ENSURE ENLIVE) liquid 237 mL  237 mL Oral BID BM Saundra Shelling, MD   Stopped at 06/13/17 1436  . insulin aspart (novoLOG) injection 0-5 Units  0-5 Units Subcutaneous QHS Lance Coon, MD       . insulin aspart (novoLOG) injection 0-9 Units  0-9 Units Subcutaneous TID WC Lance Coon, MD   2 Units at 06/13/17 1301  . multivitamin with minerals tablet 1 tablet  1 tablet Oral Daily Pyreddy, Reatha Harps, MD   1 tablet at 06/13/17 1013  . multivitamin-lutein (OCUVITE-LUTEIN) capsule 1 capsule  1 capsule Oral Daily Pyreddy, Reatha Harps, MD   1 capsule at 06/12/17 1726  . ondansetron (ZOFRAN) tablet 4 mg  4 mg Oral Q6H PRN Lance Coon, MD       Or  . ondansetron [facility]) injection 4 mg  4 mg Intravenous Q6H PRN Lance Coon, MD      . oxyCODONE (Oxy IR/ROXICODONE) immediate release tablet 5 mg  5 mg Oral Q4H PRN Lance Coon, MD   5 mg at 06/13/17 0957  . pantoprazole (PROTONIX) EC tablet 40 mg  40 mg Oral Daily Lance Coon, MD   40 mg at 06/13/17 1013  . potassium & sodium phosphates (PHOS-NAK) 280-160-250 MG packet 2 packet  2 packet Oral BID AC Saundra Shelling, MD   2 packet at 06/13/17 1610  . rOPINIRole (REQUIP) tablet 2 mg  2 mg Oral Corwin Levins, MD   2 mg at 06/12/17 2044     Discharge Medications: Please see discharge summary for a list of discharge medications.  Relevant Imaging Results:  Relevant Lab Results:   Additional Information SSN [id number]  Ross Ludwig, [place]

## 2017-06-13 NOTE — Care Management Important Message (Signed)
Copy of signed IM left in patient's room.    

## 2017-06-13 NOTE — Progress Notes (Addendum)
[facility] [place] NAME: Theresa Mills    MR#:  [id number]  DATE OF BIRTH:  1950-08-08  SUBJECTIVE:   Patient seen and evaluated today Awake alert and responds to verbal commands Has periorbital ecchymosis bilaterally Okay appetite No fever  REVIEW OF SYSTEMS:    Review of Systems  Constitutional: Positive for malaise/fatigue.  Eyes: Negative.   Respiratory: Negative.   Cardiovascular: Negative.   Gastrointestinal: Negative.   Genitourinary: Negative.   Musculoskeletal: Positive for falls.  Neurological: Positive for tremors.  Endo/Heme/Allergies: Bruises/bleeds easily.  Psychiatric/Behavioral: Negative.   Has bruises around the orbits    DRUG ALLERGIES:   Allergies  Allergen Reactions  . Codeine Other (See Comments)    HALLUCINATIONS  . [place] [Cholestatin] Other (See Comments)    HYPER AND NERVOUS  . Valium [Diazepam] Other (See Comments)    OVERLY SENSITIVE/TO STRONG    VITALS:  Blood pressure 121/60, pulse 82, temperature 97.8 F (36.6 C), temperature source Oral, resp. rate 18, height 5\' 4"  (1.626 m), weight 90.3 kg (199 lb 1.6 oz), SpO2 92 %.  PHYSICAL EXAMINATION:   Physical Exam  GENERAL:  67 y.o.-year-old patient lying in the bed  EYES: Pupils equal, round, reactive to light and accommodation. No scleral icterus. Extraocular muscles intact.  HEENT: Head normocephalic. Oropharynx and nasopharynx clear.  Ecchymosis around the orbits in both eyes along with bruises over the frontal area of the head Ecchymosis over the left side of the face too NECK:  Supple, no jugular venous distention. No thyroid enlargement, no tenderness.  LUNGS: Normal breath sounds bilaterally, no wheezing, rales, rhonchi. No use of accessory muscles of respiration.  CARDIOVASCULAR: S1, S2 normal. No murmurs, rubs, or gallops.  ABDOMEN: Soft, nontender, nondistended. Bowel sounds present. No organomegaly or mass.  EXTREMITIES: No  cyanosis, clubbing or edema b/l.    NEUROLOGIC: Arousable to loud verbal commands Oriented to time place and person Moves all extremities PSYCHIATRIC: Mood pleasant SKIN: No obvious rash Periorbital ecchymosis bilaterally Ecchymosis and bruise over the left side of the face secondary to fall  LABORATORY PANEL:   CBC Recent Labs  Lab 06/12/17 0337  WBC 9.7  HGB 14.6  HCT 43.6  PLT 161   ------------------------------------------------------------------------------------------------------------------ Chemistries  Recent Labs  Lab 06/11/17 0517  06/13/17 0502  NA 148*   < > 146*  K 3.0*   < > 3.7  CL 118*   < > 110  CO2 19*   < > 31  GLUCOSE 145*   < > 116*  BUN 59*   < > 36*  CREATININE 0.79   < > 0.45  CALCIUM 8.9   < > 8.5*  MG  --    < > 2.0  AST 69*  --   --   ALT 8*  --   --   ALKPHOS 37*  --   --   BILITOT 2.0*  --   --    < > = values in this interval not displayed.   ------------------------------------------------------------------------------------------------------------------  Cardiac Enzymes Recent Labs  Lab 06/11/17 1117  TROPONINI 0.04*   ------------------------------------------------------------------------------------------------------------------  RADIOLOGY:  No results found.   ASSESSMENT AND PLAN:  67 year old female patient with history of anxiety disorder, diabetes mellitus, GERD, hypertension, hyperlipidemia, osteoarthritis, Parkinson's disease, sleep apnea presented to the emergency room for fall and rib fractures and a facial trauma .  -Hypernatremia improved IV fluid hydration  follow-up sodium level  -Hypokalemia resolved Replace potassium  -Fall  and gait instability PT and OT evaluation Ambulatory dysfunction  -Hypophosphatemia  replace phosphorus  -Periorbital soft tissue hematoma bilaterally secondary to fall resolving slowly Monitor closely Discontinue Lovenox  -Elevated troponin secondary to  rhabdomyolysis  -Diabetes mellitus type 2 sliding scale coverage with insulin  -Dementia due to Parkinson's disease supportive care  -Rhabdomyolysis improved  -Rib fractures right side 4th-8th Pain management  -Disposition Home with home physical therapy versus rehab placement  discussed with patient's sister who lives in [place]  patient lives all alone at home and unable to take care of herself at home    All the records are reviewed and case discussed with Care Management/Social Worker. Management plans discussed with the patient, family and they are in agreement.  CODE STATUS: Full code  DVT Prophylaxis: SCDs  TOTAL TIME TAKING CARE OF THIS PATIENT: 33 minutes.   POSSIBLE D/C IN 2 to 3 DAYS, DEPENDING ON CLINICAL CONDITION.  Saundra Shelling M.D on 06/13/2017 at 3:12 PM  Between 7am to 6pm - Pager - [phone]  After 6pm go to [email or web address] - password [email or web address] [facility]  Office  [phone]  CC: Primary care physician; Christie Nottingham, PA  Note: This dictation was prepared with Dragon dictation along with smaller phrase technology. Any transcriptional errors that result from this process are unintentional.

## 2017-06-13 NOTE — Progress Notes (Signed)
Please call her sister with updates  Shari Prows [phone]  The sister is hoping the patient will go to a skilled facility before trying to go home.  The sister mentioned she wants the patient to come live closer to her in the future if possible.  Phillis Knack, RN

## 2017-06-13 NOTE — Progress Notes (Signed)
Pharmacy Electrolyte Monitoring Consult:  Pharmacy consulted to assist in monitoring and replacing electrolytes in this 67 y.o. female admitted on 06/10/2017 with [place] consulted to manage phosphorous   Labs:  Sodium (mmol/L)  Date Value  06/13/2017 146 (H)  01/09/2017 149 (H)  01/12/2014 140   Potassium (mmol/L)  Date Value  06/13/2017 3.7  01/12/2014 4.0   Magnesium (mg/dL)  Date Value  06/13/2017 2.0  01/12/2014 1.8   Phosphorus (mg/dL)  Date Value  06/13/2017 2.7   Calcium (mg/dL)  Date Value  06/13/2017 8.5 (L)   Calcium, Total (mg/dL)  Date Value  01/12/2014 8.9   Albumin (g/dL)  Date Value  06/11/2017 3.3 (L)  01/09/2017 4.2  01/12/2014 3.2 (L)    Assessment/Plan: Phos 1.9, K 3.0 - Will replace with KPhos 15 mmol IV x1 and recheck in AM. This will also provide ~22.5 mEq of potassium.  Pt is lethargic at this time so will replace with IV.   06/12/17 03:37 phos 2.4 Will give phos-nak 2 packet po Q4H x 2 doses (will also supply approximately 7 mEq potassium per packet, which should correct K 3.4). Will check phos tomorrow with AM labs. Recommend BMP and magnesium tomorrow with AM labs as well.  06/13/17 05:02 K 3.7, Mg 2, Phos 2.7. Will give phos-nak 2 packet po Q4H x 2 doses again today. Check electrolytes tomorrow with AM labs.  Pharmacy will continue to follow.   Laural Benes, Pharm.D., BCPS Clinical Pharmacist 06/13/2017 9:56 AM

## 2017-06-13 NOTE — Clinical Social Work Note (Addendum)
3:30pm, CSW attempted to see patient to complete assessment, patient was sleeping and would not wake up, CSW will try to complete assessment at a later time.  5:00pm  CSW attempted to call patient's sister Red Christians at [phone] left a message awaiting for call back from sister to discuss SNF placement option.  Bed search has started awaiting bed offers.  Jones Broom. [place], MSW, [employer]  06/13/2017 5:29 PM

## 2017-06-14 DIAGNOSIS — G4730 Sleep apnea, unspecified: Secondary | ICD-10-CM | POA: Diagnosis not present

## 2017-06-14 DIAGNOSIS — S22030D Wedge compression fracture of third thoracic vertebra, subsequent encounter for fracture with routine healing: Secondary | ICD-10-CM | POA: Diagnosis not present

## 2017-06-14 DIAGNOSIS — F419 Anxiety disorder, unspecified: Secondary | ICD-10-CM | POA: Diagnosis not present

## 2017-06-14 DIAGNOSIS — F329 Major depressive disorder, single episode, unspecified: Secondary | ICD-10-CM | POA: Diagnosis not present

## 2017-06-14 DIAGNOSIS — M6281 Muscle weakness (generalized): Secondary | ICD-10-CM | POA: Diagnosis not present

## 2017-06-14 DIAGNOSIS — Z7401 Bed confinement status: Secondary | ICD-10-CM | POA: Diagnosis not present

## 2017-06-14 DIAGNOSIS — M6282 Rhabdomyolysis: Secondary | ICD-10-CM | POA: Diagnosis not present

## 2017-06-14 DIAGNOSIS — S2249XD Multiple fractures of ribs, unspecified side, subsequent encounter for fracture with routine healing: Secondary | ICD-10-CM | POA: Diagnosis not present

## 2017-06-14 DIAGNOSIS — S0240DD Maxillary fracture, left side, subsequent encounter for fracture with routine healing: Secondary | ICD-10-CM | POA: Diagnosis not present

## 2017-06-14 DIAGNOSIS — S0512XD Contusion of eyeball and orbital tissues, left eye, subsequent encounter: Secondary | ICD-10-CM | POA: Diagnosis not present

## 2017-06-14 DIAGNOSIS — G20 Parkinson's disease: Secondary | ICD-10-CM | POA: Diagnosis not present

## 2017-06-14 DIAGNOSIS — I10 Essential (primary) hypertension: Secondary | ICD-10-CM | POA: Diagnosis not present

## 2017-06-14 DIAGNOSIS — W19XXXD Unspecified fall, subsequent encounter: Secondary | ICD-10-CM | POA: Diagnosis not present

## 2017-06-14 DIAGNOSIS — R531 Weakness: Secondary | ICD-10-CM | POA: Diagnosis not present

## 2017-06-14 DIAGNOSIS — E119 Type 2 diabetes mellitus without complications: Secondary | ICD-10-CM | POA: Diagnosis not present

## 2017-06-14 DIAGNOSIS — R748 Abnormal levels of other serum enzymes: Secondary | ICD-10-CM | POA: Diagnosis not present

## 2017-06-14 DIAGNOSIS — M1990 Unspecified osteoarthritis, unspecified site: Secondary | ICD-10-CM | POA: Diagnosis not present

## 2017-06-14 DIAGNOSIS — Z7984 Long term (current) use of oral hypoglycemic drugs: Secondary | ICD-10-CM | POA: Diagnosis not present

## 2017-06-14 DIAGNOSIS — E785 Hyperlipidemia, unspecified: Secondary | ICD-10-CM | POA: Diagnosis not present

## 2017-06-14 DIAGNOSIS — K219 Gastro-esophageal reflux disease without esophagitis: Secondary | ICD-10-CM | POA: Diagnosis not present

## 2017-06-14 DIAGNOSIS — F3341 Major depressive disorder, recurrent, in partial remission: Secondary | ICD-10-CM | POA: Diagnosis not present

## 2017-06-14 DIAGNOSIS — E039 Hypothyroidism, unspecified: Secondary | ICD-10-CM | POA: Diagnosis not present

## 2017-06-14 DIAGNOSIS — R52 Pain, unspecified: Secondary | ICD-10-CM | POA: Diagnosis not present

## 2017-06-14 DIAGNOSIS — E870 Hyperosmolality and hypernatremia: Secondary | ICD-10-CM | POA: Diagnosis not present

## 2017-06-14 LAB — MAGNESIUM: MAGNESIUM: 1.8 mg/dL (ref 1.7–2.4)

## 2017-06-14 LAB — BASIC METABOLIC PANEL
Anion gap: 6 (ref 5–15)
BUN: 29 mg/dL — AB (ref 6–20)
CHLORIDE: 107 mmol/L (ref 101–111)
CO2: 31 mmol/L (ref 22–32)
Calcium: 8.2 mg/dL — ABNORMAL LOW (ref 8.9–10.3)
Creatinine, Ser: 0.35 mg/dL — ABNORMAL LOW (ref 0.44–1.00)
GFR calc Af Amer: >60 mL/min (ref >60)
GFR calc non Af Amer: >60 mL/min (ref >60)
GLUCOSE: 111 mg/dL — AB (ref 65–99)
POTASSIUM: 3.9 mmol/L (ref 3.5–5.1)
Sodium: 144 mmol/L (ref 135–145)

## 2017-06-14 LAB — PHOSPHORUS: Phosphorus: 4.2 mg/dL (ref 2.5–4.6)

## 2017-06-14 LAB — GLUCOSE, CAPILLARY
Glucose-Capillary: 113 mg/dL — ABNORMAL HIGH (ref 65–99)
Glucose-Capillary: 128 mg/dL — ABNORMAL HIGH (ref 65–99)

## 2017-06-14 MED ORDER — TRAMADOL HCL 50 MG PO TABS: 50.0000 mg | ORAL_TABLET | Freq: Three times a day (TID) | ORAL | 0 refills | Status: AC | PRN

## 2017-06-14 NOTE — Clinical Social Work Note (Addendum)
Clinical Social Work Assessment  Patient Details  Name: WAJIHA VERSTEEG MRN: [id number] Date of Birth: [birth year]  Date of referral:  06/14/17               Reason for consult:  Facility Placement                Permission sought to share information with:  Family Supports, Customer service manager Permission granted to share information::  Yes, Verbal Permission Granted  Name::     Delrae Sawyers [phone] or [facility] Sister [phone]  [phone]   Agency::  SNF admissions  Relationship::     Contact Information:     Housing/Transportation Living arrangements for the past 2 months:  Single Family Home Source of Information:  Patient, Other (Comment Required), Siblings Patient Interpreter Needed:  None Criminal Activity/Legal Involvement Pertinent to Current Situation/Hospitalization:  No - Comment as needed Significant Relationships:  Neighbor, Friend, Siblings Lives with:  Self Do you feel safe going back to the place where you live?  No Need for family participation in patient care:  No (Coment)  Care giving concerns:  Patient feels she needs some short term rehab, before she is able to return back home.   Social Worker assessment / plan:  Patient is a 67 year old female who is alert and oriented x3, patient lives alone and has a friend named Vaughan Basta who checks on her regularly.  Patient states she has not been to SNF for rehab before, CSW explained to patient what to expect and what the process is for looking for placement.  Patient was informed how insurance will pay for stay, and how discharge planning is completed from SNF.  Patient gave CSW permission to begin bed search in [facility], patient states she would prefer [facility] if possible.  CSW explained to her that the facility will decide if they are going to accept patient, CSW told her she would have to have other choices if [facility] can not accept.   Employment status:  Retired Radiation protection practitioner:  Medicare PT Recommendations:  [place] / Referral to community resources:  [place]  Patient/Family's Response to care:  Patient is hesitant but agreeable to go to SNF for short term rehab.  Patient/Family's Understanding of and Emotional Response to Diagnosis, Current Treatment, and Prognosis:  Patient is hopeful that she will not have to be in SNF for very long.  Emotional Assessment Appearance:  Appears stated age Attitude/Demeanor/Rapport:    Affect (typically observed):  Appropriate, Calm Orientation:  Oriented to Self, Oriented to Place, Oriented to Situation Alcohol / Substance use:  Not Applicable Psych involvement (Current and /or in the community):  No (Comment)  Discharge Needs  Concerns to be addressed:  Care Coordination, Lack of Support Readmission within the last 30 days:  No Current discharge risk:  Lack of support system, Lives alone Barriers to Discharge:  Continued Medical Work up   Anell Barr 06/14/2017, 9:55 AM

## 2017-06-14 NOTE — Progress Notes (Signed)
Pharmacy Electrolyte Monitoring Consult:  Pharmacy consulted to assist in monitoring and replacing electrolytes in this 67 y.o. female admitted on 06/10/2017 with [place] consulted to manage phosphorous   Labs:  Sodium (mmol/L)  Date Value  06/14/2017 144  01/09/2017 149 (H)  01/12/2014 140   Potassium (mmol/L)  Date Value  06/14/2017 3.9  01/12/2014 4.0   Magnesium (mg/dL)  Date Value  06/14/2017 1.8  01/12/2014 1.8   Phosphorus (mg/dL)  Date Value  06/14/2017 4.2   Calcium (mg/dL)  Date Value  06/14/2017 8.2 (L)   Calcium, Total (mg/dL)  Date Value  01/12/2014 8.9   Albumin (g/dL)  Date Value  06/11/2017 3.3 (L)  01/09/2017 4.2  01/12/2014 3.2 (L)    Assessment/Plan: 5/23 AM: K: 3.9, Phos: 4.2, Mg: 1.8. No supplementation needed at this time.   Pharmacy will continue to follow.   Tersea Aulds M Samule Life, Pharm.D., BCPS Clinical Pharmacist 06/14/2017 7:26 AM

## 2017-06-14 NOTE — Clinical Social Work Note (Signed)
CSW presented bed offers to patient and she wanted [facility], however they are not able to accept patient.  CSW spoke to patient, and she asked for CSW to speak with her friend [name], [phone] to help with decision making.  CSW contacted Vaughan Basta, and she suggested [facility], CSW spoke to [facility] and they can accept patient today.  Patient to be d/c'ed today to [facility].  Patient and family agreeable to plans will transport via ems RN to call report to [phone].  Patient's friend Vaughan Basta is aware that patient will be discharging today.  Evette Cristal, MSW, [place]

## 2017-06-14 NOTE — Progress Notes (Signed)
Patient discharged via stretcher and EMS. No complaints at time of DC.

## 2017-06-14 NOTE — Clinical Social Work Placement (Signed)
   CLINICAL SOCIAL WORK PLACEMENT  NOTE  Date:  06/14/2017  Patient Details  Name: Theresa Mills MRN: [id number] Date of Birth: 12/15/1950  Clinical Social Work is seeking post-discharge placement for this patient at the [place] level of care (*CSW will initial, date and re-position this form in  chart as items are completed):  Yes   Patient/family provided with [place] Work Department's list of facilities offering this level of care within the geographic area requested by the patient (or if unable, by the patient's family).  Yes   Patient/family informed of their freedom to choose among providers that offer the needed level of care, that participate in Medicare, Medicaid or managed care program needed by the patient, have an available bed and are willing to accept the patient.  Yes   Patient/family informed of 's ownership interest in [facility] and [facility], as well as of the fact that they are under no obligation to receive care at these facilities.  PASRR submitted to EDS on 06/14/17     PASRR number received on       Existing PASRR number confirmed on 06/14/17     FL2 transmitted to all facilities in geographic area requested by pt/family on 06/14/17     FL2 transmitted to all facilities within larger geographic area on       Patient informed that his/her managed care company has contracts with or will negotiate with certain facilities, including the following:        Yes   Patient/family informed of bed offers received.  Patient chooses bed at [facility]     Physician recommends and patient chooses bed at      Patient to be transferred to [facility] on 06/14/17.  Patient to be transferred to facility by [facility]     Patient family notified on 06/14/17 of transfer.  Name of family member notified:  Patient's friend Vaughan Basta [phone]     PHYSICIAN Please sign FL2,  Please sign DNR     Additional Comment:    _______________________________________________ Ross Ludwig, LCSWA 06/14/2017, 3:01 PM

## 2017-06-14 NOTE — Discharge Summary (Addendum)
[facility] [facility] [place] NAME: Theresa Mills    MR#:  [id number]  DATE OF BIRTH:  1950-09-20  DATE OF ADMISSION:  06/10/2017 ADMITTING PHYSICIAN: Lance Coon, MD  DATE OF DISCHARGE: No discharge date for patient encounter.  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 Anxiety   . Diabetes ([place])   . Difficult intravenous access   . GERD (gastroesophageal reflux disease)   . HBP (high blood pressure)   . Hyperlipidemia   . Osteoarthritis   . Parkinson disease ([place])   . Sleep apnea   . Thyroid disease      ADMITTING HISTORY Theresa Mills  is a 67 y.o. female who presents with fall at home of unknown etiology.  Patient was found by her friend who went to check on her today.  Last known normal was 3 days ago.  Patient was found here to have rhabdomyolysis, rib fractures, significant facial trauma.  Hospitalist called for admission  HOSPITAL COURSE:  Patient admitted to the medical floor.  Patient was hydrated with IV fluids for dehydration and hyponatremia.  Her rhabdomyolysis also improved CK level came down with IV fluids.  Patient was worked up with CT maxillofacial area CT lumbar spine thoracic spine and CT head along  with CT cervical spine.  CT maxillofacial area showed left periorbital soft tissue hematoma no evidence of any orbital fracture, left maxillary sinus nondisplaced fracture.  Patient was off any blood thinner medications in the hospital.  Her hematomas and bruises improved.  CT cervical spine showed old T12 compression fracture.  She also has T3 compression fractures. she also had rib fractures secondary to fall no evidence of any pleural effusion and pneumothorax.  Patient was having comfortable oxygen saturation on room air.  Patient lives alone unable to take care of herself at home.  Family lives in different state and unable to take care of her.  Social worker consult was done.  Patient will be discharged to facility with out patient follow up for vertebroplasty for compression fractures by orthopedics.  Patient hemodynamically stable will be discharged today.  CONSULTS OBTAINED:    DRUG ALLERGIES:   Allergies  Allergen Reactions  . Codeine Other (See Comments)    HALLUCINATIONS  . Ephadrene [Cholestatin] Other (See Comments)    HYPER AND NERVOUS  . Valium [Diazepam] Other (See Comments)    OVERLY SENSITIVE/TO STRONG    DISCHARGE MEDICATIONS:   Allergies as of 06/14/2017      Reactions   Codeine Other (See Comments)   HALLUCINATIONS   Ephadrene [cholestatin] Other (See Comments)   HYPER AND NERVOUS   Valium [diazepam] Other (See Comments)   OVERLY SENSITIVE/TO STRONG      Medication List    STOP taking these medications   ALPRAZolam 0.5 MG tablet Commonly known as:  XANAX   celecoxib 200  MG capsule Commonly known as:  CELEBREX   ondansetron 4 MG tablet Commonly known as:  ZOFRAN   sertraline 50 MG tablet Commonly known as:  ZOLOFT     TAKE these medications   CALCIUM HIGH POTENCY/VITAMIN D 600-200 MG-UNIT Tabs Generic drug:  Calcium Carbonate-Vitamin D Take 1 tablet by mouth daily.   carbidopa-levodopa 50-200 MG tablet Commonly known as:  SINEMET CR Take 1  tablet by mouth 3 (three) times daily. What changed:  Another medication with the same name was removed. Continue taking this medication, and follow the directions you see here.   citalopram 40 MG tablet Commonly known as:  CELEXA Take 40 mg by mouth daily.   INVOKANA 300 MG Tabs tablet Generic drug:  canagliflozin Take 300 mg by mouth daily before breakfast.   levothyroxine 175 MCG tablet Commonly known as:  SYNTHROID, LEVOTHROID Take 1 tablet (175 mcg total) by mouth daily.   metFORMIN 500 MG tablet Commonly known as:  GLUCOPHAGE Take 500 mg by mouth 2 (two) times daily with a meal.   MULTIVITAMIN WOMEN 50+ PO Take 1 tablet by mouth daily.   omeprazole 40 MG capsule Commonly known as:  PRILOSEC TAKE 1 CAPSULE BY MOUTH ONCE DAILY   ONETOUCH DELICA LANCETS 71I Misc TEST BLOOD SUGAR BID   ONETOUCH VERIO test strip Generic drug:  glucose blood TEST BLOOD SUGAR BID   rOPINIRole 2 MG 24 hr tablet Commonly known as:  REQUIP XL Take 2 mg by mouth at bedtime.   simvastatin 40 MG tablet Commonly known as:  ZOCOR Take 1 tablet (40 mg total) by mouth at bedtime.   traMADol 50 MG tablet Commonly known as:  ULTRAM Take 1 tablet (50 mg total) by mouth every 8 (eight) hours as needed for moderate pain.       Today   Patient seen and evaluated today No new overnight events left periorbital hematoma improving No chest pain Decreased back pain  VITAL SIGNS:  Blood pressure (!) 131/56, pulse 65, temperature 98 F (36.7 C), temperature source Oral, resp. rate 18, height 5\' 4"  (1.626 m), weight 90.3 kg (199 lb 1.6 oz), SpO2 94 %.  I/O:    Intake/Output Summary (Last 24 hours) at 06/14/2017 1130 Last data filed at 06/14/2017 1042 Gross per 24 hour  Intake 360 ml  Output -  Net 360 ml    PHYSICAL EXAMINATION:  Physical Exam  GENERAL:  67 y.o.-year-old patient lying in the bed with no acute distress.  LUNGS: Normal breath sounds bilaterally, no wheezing,  rales,rhonchi or crepitation. No use of accessory muscles of respiration.  CARDIOVASCULAR: S1, S2 normal. No murmurs, rubs, or gallops.  ABDOMEN: Soft, non-tender, non-distended. Bowel sounds present. No organomegaly or mass.  NEUROLOGIC: Moves all 4 extremities. PSYCHIATRIC: The patient is alert and oriented x 3.  SKIN: Left periorbital hematoma  ecchymosis over the frontal area of the face along with the left side of the face  DATA REVIEW:   CBC Recent Labs  Lab 06/12/17 0337  WBC 9.7  HGB 14.6  HCT 43.6  PLT 161    Chemistries  Recent Labs  Lab 06/11/17 0517  06/14/17 0442  NA 148*   < > 144  K 3.0*   < > 3.9  CL 118*   < > 107  CO2 19*   < > 31  GLUCOSE 145*   < > 111*  BUN 59*   < > 29*  CREATININE 0.79   < > 0.35*  CALCIUM 8.9   < > 8.2*  MG  --    < > 1.8  AST 69*  --   --   ALT 8*  --   --   ALKPHOS 37*  --   --   BILITOT 2.0*  --   --    < > = values in this interval not displayed.    Cardiac Enzymes Recent Labs  Lab 06/11/17 1117  TROPONINI 0.04*    Microbiology Results  Results for orders placed or performed in visit on 01/08/17  Microscopic Examination     Status: Abnormal   Collection Time: 01/11/17  9:39 AM  Result Value Ref Range Status   WBC, UA 11-30 (A) 0 - 5 /hpf Final   RBC, UA 3-10 (A) 0 - 2 /hpf Final   Epithelial Cells (non renal) 0-10 0 - 10 /hpf Final   Casts None seen None seen /lpf Final   Crystals Present (A) N/A Final   Crystal Type Calcium Oxalate N/A Final   Mucus, UA Present Not Estab. Final   Bacteria, UA Few None seen/Few Final   Yeast, UA Present (A) None seen Final    RADIOLOGY:  No results found.  Follow up with PCP in 1 week.  Management plans discussed with the patient, family and they are in agreement.  CODE STATUS: DNR    Code Status Orders  (From admission, onward)        Start     Ordered   06/12/17 1413  Do not attempt resuscitation (DNR)  Continuous    Question Answer Comment  In the event  of cardiac or respiratory ARREST Do not call a "code blue"   In the event of cardiac or respiratory ARREST Do not perform Intubation, CPR, defibrillation or ACLS   In the event of cardiac or respiratory ARREST Use medication by any route, position, wound care, and other measures to relive pain and suffering. May use oxygen, suction and manual treatment of airway obstruction as needed for comfort.      06/12/17 1412    Code Status History    Date Active Date Inactive Code Status Order ID Comments User Context   06/10/2017 2338 06/12/2017 1412 Full Code [id number]  Lance Coon, MD Inpatient      TOTAL TIME TAKING CARE OF THIS PATIENT ON DAY OF DISCHARGE: more than 35 minutes.   Saundra Shelling M.D on 06/14/2017 at 11:30 AM  Between 7am to 6pm - Pager - [phone]  After 6pm go to [email or web address] - password [email or web address] [facility]  Office  [phone]  CC: Primary care physician; Christie Nottingham, PA  Note: This dictation was prepared with Dragon dictation along with smaller phrase technology. Any transcriptional errors that result from this process are unintentional.

## 2017-06-14 NOTE — Progress Notes (Addendum)
Nutrition Follow Up Note   DOCUMENTATION CODES:   Obesity unspecified  INTERVENTION:   Bowel regimen per MD  Ensure Enlive po BID, each supplement provides 350 kcal and 20 grams of protein  MVI daily  Ocuvite daily for wound healing (provides zinc, vitamin A, vitamin C, Vitamin E, copper, and selenium)  Magic cup TID with meals, each supplement provides 290 kcal and 9 grams of protein  NUTRITION DIAGNOSIS:   Inadequate oral intake related to acute illness as evidenced by meal completion < 25%.  - oral intake improved  GOAL:   Patient will meet greater than or equal to 90% of their needs  -progressing   MONITOR:   PO intake, Supplement acceptance, Labs, Weight trends, Skin, I & O's  ASSESSMENT:   67 year old female with PMH as noted above including history of Parkinson's presents after she was found in her home after an apparent fall.    Pt with improved appetite and oral intake; eating 25-100% of meals, drinking Ensure, and eating Magic Cups. No new weight since 5/20; recommend obtain new wt. Refeeding labs wnl. Continue supplements and vitamins. Liberal diet. No documented BM since admit; recommend bowel regimen per MD.   Medications reviewed and include: insulin, MIV, ocuvite, protonix  Labs reviewed: K 3.9 wnl, P 4.2 wnl, Mg 1.8 wnl  Diet Order:   Diet Order           Diet regular Room service appropriate? Yes; Fluid consistency: Thin  Diet effective now         EDUCATION NEEDS:   No education needs have been identified at this time  Skin:  Skin Assessment: (pressure injury sacrum 1X3cm, Right elbow with partial thickness abrasion; 5X4X1cm, Right hip with stage 2 pressure injury; 5X2X1cm)  Last BM:  pta  Height:   Ht Readings from Last 1 Encounters:  06/11/17 5\' 4"  (1.626 m)    Weight:   Wt Readings from Last 1 Encounters:  06/11/17 199 lb 1.6 oz (90.3 kg)    Ideal Body Weight:  54.5 kg  BMI:  Body mass index is 34.18 kg/m.  Estimated  Nutritional Needs:   Kcal:  1600-1900kcal/day   Protein:  90-108g/day   Fluid:  >1.4L/day   Koleen Distance MS, RD, LDN Pager #- [phone] Office#- [phone] After Hours Pager: [phone]

## 2017-06-15 DIAGNOSIS — E119 Type 2 diabetes mellitus without complications: Secondary | ICD-10-CM | POA: Diagnosis not present

## 2017-06-18 DIAGNOSIS — E1165 Type 2 diabetes mellitus with hyperglycemia: Secondary | ICD-10-CM | POA: Insufficient documentation

## 2017-07-04 DIAGNOSIS — F3341 Major depressive disorder, recurrent, in partial remission: Secondary | ICD-10-CM | POA: Diagnosis not present

## 2017-07-04 DIAGNOSIS — R531 Weakness: Secondary | ICD-10-CM | POA: Diagnosis not present

## 2017-07-04 DIAGNOSIS — R52 Pain, unspecified: Secondary | ICD-10-CM | POA: Diagnosis not present

## 2017-07-10 DIAGNOSIS — E119 Type 2 diabetes mellitus without complications: Secondary | ICD-10-CM | POA: Diagnosis not present

## 2017-07-10 DIAGNOSIS — S0512XD Contusion of eyeball and orbital tissues, left eye, subsequent encounter: Secondary | ICD-10-CM | POA: Diagnosis not present

## 2017-07-10 DIAGNOSIS — S0240DD Maxillary fracture, left side, subsequent encounter for fracture with routine healing: Secondary | ICD-10-CM | POA: Diagnosis not present

## 2017-07-10 DIAGNOSIS — I10 Essential (primary) hypertension: Secondary | ICD-10-CM | POA: Diagnosis not present

## 2017-07-10 DIAGNOSIS — S22030D Wedge compression fracture of third thoracic vertebra, subsequent encounter for fracture with routine healing: Secondary | ICD-10-CM | POA: Diagnosis not present

## 2017-07-10 DIAGNOSIS — S2249XD Multiple fractures of ribs, unspecified side, subsequent encounter for fracture with routine healing: Secondary | ICD-10-CM | POA: Diagnosis not present

## 2017-07-12 ENCOUNTER — Telehealth: Payer: Self-pay

## 2017-07-12 NOTE — Telephone Encounter (Signed)
Loleta Dicker with [facility] called stating that they have opened up home care for pt and they are treating the spot on her bottom.

## 2017-07-12 NOTE — Telephone Encounter (Signed)
Informed pt that medication is OTC (AZO cranberry)

## 2017-07-12 NOTE — Telephone Encounter (Signed)
There is OTC med, mad by AZO that is cranberry supplement. That is the only thing I know of. This is OTC though. Does not need to be called in.

## 2017-07-13 DIAGNOSIS — S22030D Wedge compression fracture of third thoracic vertebra, subsequent encounter for fracture with routine healing: Secondary | ICD-10-CM | POA: Diagnosis not present

## 2017-07-13 DIAGNOSIS — S0512XD Contusion of eyeball and orbital tissues, left eye, subsequent encounter: Secondary | ICD-10-CM | POA: Diagnosis not present

## 2017-07-13 DIAGNOSIS — S2249XD Multiple fractures of ribs, unspecified side, subsequent encounter for fracture with routine healing: Secondary | ICD-10-CM | POA: Diagnosis not present

## 2017-07-13 DIAGNOSIS — E119 Type 2 diabetes mellitus without complications: Secondary | ICD-10-CM | POA: Diagnosis not present

## 2017-07-13 DIAGNOSIS — I10 Essential (primary) hypertension: Secondary | ICD-10-CM | POA: Diagnosis not present

## 2017-07-13 DIAGNOSIS — S0240DD Maxillary fracture, left side, subsequent encounter for fracture with routine healing: Secondary | ICD-10-CM | POA: Diagnosis not present

## 2017-07-17 ENCOUNTER — Telehealth: Payer: Self-pay

## 2017-07-17 NOTE — Telephone Encounter (Signed)
GAVE VERBAL ORDER TO [facility]ES[facility] PT FROM [facility] FOR Theresa Mills 1 XA WEEK FOR ONE WEEK 2 X FOR 4 WEEKS ,1 X WEEKS FOR 2 WEEKS [phone] AS PER HEATHER ITS OK

## 2017-07-18 DIAGNOSIS — S0512XD Contusion of eyeball and orbital tissues, left eye, subsequent encounter: Secondary | ICD-10-CM | POA: Diagnosis not present

## 2017-07-18 DIAGNOSIS — S22030D Wedge compression fracture of third thoracic vertebra, subsequent encounter for fracture with routine healing: Secondary | ICD-10-CM | POA: Diagnosis not present

## 2017-07-18 DIAGNOSIS — S2249XD Multiple fractures of ribs, unspecified side, subsequent encounter for fracture with routine healing: Secondary | ICD-10-CM | POA: Diagnosis not present

## 2017-07-18 DIAGNOSIS — E119 Type 2 diabetes mellitus without complications: Secondary | ICD-10-CM | POA: Diagnosis not present

## 2017-07-18 DIAGNOSIS — S0240DD Maxillary fracture, left side, subsequent encounter for fracture with routine healing: Secondary | ICD-10-CM | POA: Diagnosis not present

## 2017-07-18 DIAGNOSIS — I10 Essential (primary) hypertension: Secondary | ICD-10-CM | POA: Diagnosis not present

## 2017-07-19 DIAGNOSIS — I10 Essential (primary) hypertension: Secondary | ICD-10-CM | POA: Diagnosis not present

## 2017-07-19 DIAGNOSIS — E119 Type 2 diabetes mellitus without complications: Secondary | ICD-10-CM | POA: Diagnosis not present

## 2017-07-19 DIAGNOSIS — S0512XD Contusion of eyeball and orbital tissues, left eye, subsequent encounter: Secondary | ICD-10-CM | POA: Diagnosis not present

## 2017-07-19 DIAGNOSIS — S0240DD Maxillary fracture, left side, subsequent encounter for fracture with routine healing: Secondary | ICD-10-CM | POA: Diagnosis not present

## 2017-07-19 DIAGNOSIS — S2249XD Multiple fractures of ribs, unspecified side, subsequent encounter for fracture with routine healing: Secondary | ICD-10-CM | POA: Diagnosis not present

## 2017-07-19 DIAGNOSIS — S22030D Wedge compression fracture of third thoracic vertebra, subsequent encounter for fracture with routine healing: Secondary | ICD-10-CM | POA: Diagnosis not present

## 2017-07-23 DIAGNOSIS — E119 Type 2 diabetes mellitus without complications: Secondary | ICD-10-CM | POA: Diagnosis not present

## 2017-07-23 DIAGNOSIS — S0512XD Contusion of eyeball and orbital tissues, left eye, subsequent encounter: Secondary | ICD-10-CM | POA: Diagnosis not present

## 2017-07-23 DIAGNOSIS — S0240DD Maxillary fracture, left side, subsequent encounter for fracture with routine healing: Secondary | ICD-10-CM | POA: Diagnosis not present

## 2017-07-23 DIAGNOSIS — S22030D Wedge compression fracture of third thoracic vertebra, subsequent encounter for fracture with routine healing: Secondary | ICD-10-CM | POA: Diagnosis not present

## 2017-07-23 DIAGNOSIS — I10 Essential (primary) hypertension: Secondary | ICD-10-CM | POA: Diagnosis not present

## 2017-07-23 DIAGNOSIS — S2249XD Multiple fractures of ribs, unspecified side, subsequent encounter for fracture with routine healing: Secondary | ICD-10-CM | POA: Diagnosis not present

## 2017-07-24 DIAGNOSIS — I10 Essential (primary) hypertension: Secondary | ICD-10-CM | POA: Diagnosis not present

## 2017-07-24 DIAGNOSIS — S0240DD Maxillary fracture, left side, subsequent encounter for fracture with routine healing: Secondary | ICD-10-CM | POA: Diagnosis not present

## 2017-07-24 DIAGNOSIS — S2249XD Multiple fractures of ribs, unspecified side, subsequent encounter for fracture with routine healing: Secondary | ICD-10-CM | POA: Diagnosis not present

## 2017-07-24 DIAGNOSIS — S0512XD Contusion of eyeball and orbital tissues, left eye, subsequent encounter: Secondary | ICD-10-CM | POA: Diagnosis not present

## 2017-07-24 DIAGNOSIS — S22030D Wedge compression fracture of third thoracic vertebra, subsequent encounter for fracture with routine healing: Secondary | ICD-10-CM | POA: Diagnosis not present

## 2017-07-24 DIAGNOSIS — E119 Type 2 diabetes mellitus without complications: Secondary | ICD-10-CM | POA: Diagnosis not present

## 2017-07-25 ENCOUNTER — Other Ambulatory Visit: Payer: Self-pay

## 2017-07-25 DIAGNOSIS — S2249XD Multiple fractures of ribs, unspecified side, subsequent encounter for fracture with routine healing: Secondary | ICD-10-CM | POA: Diagnosis not present

## 2017-07-25 DIAGNOSIS — E119 Type 2 diabetes mellitus without complications: Secondary | ICD-10-CM | POA: Diagnosis not present

## 2017-07-25 DIAGNOSIS — S0512XD Contusion of eyeball and orbital tissues, left eye, subsequent encounter: Secondary | ICD-10-CM | POA: Diagnosis not present

## 2017-07-25 DIAGNOSIS — I10 Essential (primary) hypertension: Secondary | ICD-10-CM | POA: Diagnosis not present

## 2017-07-25 DIAGNOSIS — S0240DD Maxillary fracture, left side, subsequent encounter for fracture with routine healing: Secondary | ICD-10-CM | POA: Diagnosis not present

## 2017-07-25 DIAGNOSIS — S22030D Wedge compression fracture of third thoracic vertebra, subsequent encounter for fracture with routine healing: Secondary | ICD-10-CM | POA: Diagnosis not present

## 2017-07-25 MED ORDER — CITALOPRAM HYDROBROMIDE 40 MG PO TABS: 40.0000 mg | ORAL_TABLET | Freq: Every day | ORAL | 4 refills | Status: DC

## 2017-08-01 DIAGNOSIS — I10 Essential (primary) hypertension: Secondary | ICD-10-CM | POA: Diagnosis not present

## 2017-08-01 DIAGNOSIS — S0512XD Contusion of eyeball and orbital tissues, left eye, subsequent encounter: Secondary | ICD-10-CM | POA: Diagnosis not present

## 2017-08-01 DIAGNOSIS — E119 Type 2 diabetes mellitus without complications: Secondary | ICD-10-CM | POA: Diagnosis not present

## 2017-08-01 DIAGNOSIS — S2249XD Multiple fractures of ribs, unspecified side, subsequent encounter for fracture with routine healing: Secondary | ICD-10-CM | POA: Diagnosis not present

## 2017-08-01 DIAGNOSIS — S0240DD Maxillary fracture, left side, subsequent encounter for fracture with routine healing: Secondary | ICD-10-CM | POA: Diagnosis not present

## 2017-08-01 DIAGNOSIS — S22030D Wedge compression fracture of third thoracic vertebra, subsequent encounter for fracture with routine healing: Secondary | ICD-10-CM | POA: Diagnosis not present

## 2017-08-02 ENCOUNTER — Other Ambulatory Visit: Payer: Self-pay | Admitting: Nurse Practitioner

## 2017-08-02 MED ORDER — ALPRAZOLAM 0.5 MG PO TABS: 0.5000 mg | ORAL_TABLET | Freq: Every evening | ORAL | 1 refills | Status: DC | PRN

## 2017-08-03 DIAGNOSIS — S2249XD Multiple fractures of ribs, unspecified side, subsequent encounter for fracture with routine healing: Secondary | ICD-10-CM | POA: Diagnosis not present

## 2017-08-03 DIAGNOSIS — I10 Essential (primary) hypertension: Secondary | ICD-10-CM | POA: Diagnosis not present

## 2017-08-03 DIAGNOSIS — S0240DD Maxillary fracture, left side, subsequent encounter for fracture with routine healing: Secondary | ICD-10-CM | POA: Diagnosis not present

## 2017-08-03 DIAGNOSIS — E119 Type 2 diabetes mellitus without complications: Secondary | ICD-10-CM | POA: Diagnosis not present

## 2017-08-03 DIAGNOSIS — S22030D Wedge compression fracture of third thoracic vertebra, subsequent encounter for fracture with routine healing: Secondary | ICD-10-CM | POA: Diagnosis not present

## 2017-08-03 DIAGNOSIS — S0512XD Contusion of eyeball and orbital tissues, left eye, subsequent encounter: Secondary | ICD-10-CM | POA: Diagnosis not present

## 2017-08-07 DIAGNOSIS — E119 Type 2 diabetes mellitus without complications: Secondary | ICD-10-CM | POA: Diagnosis not present

## 2017-08-07 DIAGNOSIS — S2249XD Multiple fractures of ribs, unspecified side, subsequent encounter for fracture with routine healing: Secondary | ICD-10-CM | POA: Diagnosis not present

## 2017-08-07 DIAGNOSIS — S0512XD Contusion of eyeball and orbital tissues, left eye, subsequent encounter: Secondary | ICD-10-CM | POA: Diagnosis not present

## 2017-08-07 DIAGNOSIS — S22030D Wedge compression fracture of third thoracic vertebra, subsequent encounter for fracture with routine healing: Secondary | ICD-10-CM | POA: Diagnosis not present

## 2017-08-07 DIAGNOSIS — I10 Essential (primary) hypertension: Secondary | ICD-10-CM | POA: Diagnosis not present

## 2017-08-07 DIAGNOSIS — S0240DD Maxillary fracture, left side, subsequent encounter for fracture with routine healing: Secondary | ICD-10-CM | POA: Diagnosis not present

## 2017-08-08 ENCOUNTER — Telehealth: Payer: Self-pay

## 2017-08-08 DIAGNOSIS — E119 Type 2 diabetes mellitus without complications: Secondary | ICD-10-CM | POA: Diagnosis not present

## 2017-08-08 DIAGNOSIS — S0240DD Maxillary fracture, left side, subsequent encounter for fracture with routine healing: Secondary | ICD-10-CM | POA: Diagnosis not present

## 2017-08-08 DIAGNOSIS — S0512XD Contusion of eyeball and orbital tissues, left eye, subsequent encounter: Secondary | ICD-10-CM | POA: Diagnosis not present

## 2017-08-08 DIAGNOSIS — S22030D Wedge compression fracture of third thoracic vertebra, subsequent encounter for fracture with routine healing: Secondary | ICD-10-CM | POA: Diagnosis not present

## 2017-08-08 DIAGNOSIS — I10 Essential (primary) hypertension: Secondary | ICD-10-CM | POA: Diagnosis not present

## 2017-08-08 DIAGNOSIS — S2249XD Multiple fractures of ribs, unspecified side, subsequent encounter for fracture with routine healing: Secondary | ICD-10-CM | POA: Diagnosis not present

## 2017-08-08 NOTE — Telephone Encounter (Signed)
Home  Health physical [phone]) from called that Ms Gosline fell yesterday and hurt her right hand is swelling as per heather advised home health PT that if selling is not going down she need go to [facility]

## 2017-08-09 DIAGNOSIS — S0240DD Maxillary fracture, left side, subsequent encounter for fracture with routine healing: Secondary | ICD-10-CM | POA: Diagnosis not present

## 2017-08-09 DIAGNOSIS — E119 Type 2 diabetes mellitus without complications: Secondary | ICD-10-CM | POA: Diagnosis not present

## 2017-08-09 DIAGNOSIS — I10 Essential (primary) hypertension: Secondary | ICD-10-CM | POA: Diagnosis not present

## 2017-08-09 DIAGNOSIS — S22030D Wedge compression fracture of third thoracic vertebra, subsequent encounter for fracture with routine healing: Secondary | ICD-10-CM | POA: Diagnosis not present

## 2017-08-09 DIAGNOSIS — S0512XD Contusion of eyeball and orbital tissues, left eye, subsequent encounter: Secondary | ICD-10-CM | POA: Diagnosis not present

## 2017-08-09 DIAGNOSIS — S2249XD Multiple fractures of ribs, unspecified side, subsequent encounter for fracture with routine healing: Secondary | ICD-10-CM | POA: Diagnosis not present

## 2017-08-10 DIAGNOSIS — S0512XD Contusion of eyeball and orbital tissues, left eye, subsequent encounter: Secondary | ICD-10-CM | POA: Diagnosis not present

## 2017-08-10 DIAGNOSIS — I10 Essential (primary) hypertension: Secondary | ICD-10-CM | POA: Diagnosis not present

## 2017-08-10 DIAGNOSIS — S22030D Wedge compression fracture of third thoracic vertebra, subsequent encounter for fracture with routine healing: Secondary | ICD-10-CM | POA: Diagnosis not present

## 2017-08-10 DIAGNOSIS — S2249XD Multiple fractures of ribs, unspecified side, subsequent encounter for fracture with routine healing: Secondary | ICD-10-CM | POA: Diagnosis not present

## 2017-08-10 DIAGNOSIS — S0240DD Maxillary fracture, left side, subsequent encounter for fracture with routine healing: Secondary | ICD-10-CM | POA: Diagnosis not present

## 2017-08-10 DIAGNOSIS — E119 Type 2 diabetes mellitus without complications: Secondary | ICD-10-CM | POA: Diagnosis not present

## 2017-08-15 DIAGNOSIS — S0512XD Contusion of eyeball and orbital tissues, left eye, subsequent encounter: Secondary | ICD-10-CM | POA: Diagnosis not present

## 2017-08-15 DIAGNOSIS — I10 Essential (primary) hypertension: Secondary | ICD-10-CM | POA: Diagnosis not present

## 2017-08-15 DIAGNOSIS — S2249XD Multiple fractures of ribs, unspecified side, subsequent encounter for fracture with routine healing: Secondary | ICD-10-CM | POA: Diagnosis not present

## 2017-08-15 DIAGNOSIS — E119 Type 2 diabetes mellitus without complications: Secondary | ICD-10-CM | POA: Diagnosis not present

## 2017-08-15 DIAGNOSIS — S0240DD Maxillary fracture, left side, subsequent encounter for fracture with routine healing: Secondary | ICD-10-CM | POA: Diagnosis not present

## 2017-08-15 DIAGNOSIS — S22030D Wedge compression fracture of third thoracic vertebra, subsequent encounter for fracture with routine healing: Secondary | ICD-10-CM | POA: Diagnosis not present

## 2017-08-17 DIAGNOSIS — S0512XD Contusion of eyeball and orbital tissues, left eye, subsequent encounter: Secondary | ICD-10-CM | POA: Diagnosis not present

## 2017-08-17 DIAGNOSIS — S2249XD Multiple fractures of ribs, unspecified side, subsequent encounter for fracture with routine healing: Secondary | ICD-10-CM | POA: Diagnosis not present

## 2017-08-17 DIAGNOSIS — I10 Essential (primary) hypertension: Secondary | ICD-10-CM | POA: Diagnosis not present

## 2017-08-17 DIAGNOSIS — S0240DD Maxillary fracture, left side, subsequent encounter for fracture with routine healing: Secondary | ICD-10-CM | POA: Diagnosis not present

## 2017-08-17 DIAGNOSIS — S22030D Wedge compression fracture of third thoracic vertebra, subsequent encounter for fracture with routine healing: Secondary | ICD-10-CM | POA: Diagnosis not present

## 2017-08-17 DIAGNOSIS — E119 Type 2 diabetes mellitus without complications: Secondary | ICD-10-CM | POA: Diagnosis not present

## 2017-08-20 ENCOUNTER — Telehealth: Payer: Self-pay | Admitting: Internal Medicine

## 2017-08-20 NOTE — Telephone Encounter (Signed)
SIGNED INTERIM ORDERS MAILED TO [email or web address]

## 2017-08-21 DIAGNOSIS — I10 Essential (primary) hypertension: Secondary | ICD-10-CM | POA: Diagnosis not present

## 2017-08-21 DIAGNOSIS — S22030D Wedge compression fracture of third thoracic vertebra, subsequent encounter for fracture with routine healing: Secondary | ICD-10-CM | POA: Diagnosis not present

## 2017-08-21 DIAGNOSIS — S0240DD Maxillary fracture, left side, subsequent encounter for fracture with routine healing: Secondary | ICD-10-CM | POA: Diagnosis not present

## 2017-08-21 DIAGNOSIS — S2249XD Multiple fractures of ribs, unspecified side, subsequent encounter for fracture with routine healing: Secondary | ICD-10-CM | POA: Diagnosis not present

## 2017-08-21 DIAGNOSIS — E119 Type 2 diabetes mellitus without complications: Secondary | ICD-10-CM | POA: Diagnosis not present

## 2017-08-21 DIAGNOSIS — S0512XD Contusion of eyeball and orbital tissues, left eye, subsequent encounter: Secondary | ICD-10-CM | POA: Diagnosis not present

## 2017-08-22 DIAGNOSIS — S0240DD Maxillary fracture, left side, subsequent encounter for fracture with routine healing: Secondary | ICD-10-CM | POA: Diagnosis not present

## 2017-08-22 DIAGNOSIS — E119 Type 2 diabetes mellitus without complications: Secondary | ICD-10-CM | POA: Diagnosis not present

## 2017-08-22 DIAGNOSIS — S2249XD Multiple fractures of ribs, unspecified side, subsequent encounter for fracture with routine healing: Secondary | ICD-10-CM | POA: Diagnosis not present

## 2017-08-22 DIAGNOSIS — I10 Essential (primary) hypertension: Secondary | ICD-10-CM | POA: Diagnosis not present

## 2017-08-22 DIAGNOSIS — S0512XD Contusion of eyeball and orbital tissues, left eye, subsequent encounter: Secondary | ICD-10-CM | POA: Diagnosis not present

## 2017-08-22 DIAGNOSIS — S22030D Wedge compression fracture of third thoracic vertebra, subsequent encounter for fracture with routine healing: Secondary | ICD-10-CM | POA: Diagnosis not present

## 2017-08-23 ENCOUNTER — Ambulatory Visit
Admission: RE | Admit: 2017-08-23 | Discharge: 2017-08-23 | Disposition: A | Discharge status: Home / Self Care | Payer: Medicare Other | Source: Ambulatory Visit | Attending: Nurse Practitioner | Admitting: Nurse Practitioner

## 2017-08-23 ENCOUNTER — Other Ambulatory Visit: Payer: Self-pay | Admitting: Nurse Practitioner

## 2017-08-23 DIAGNOSIS — Z1239 Encounter for other screening for malignant neoplasm of breast: Secondary | ICD-10-CM

## 2017-08-23 DIAGNOSIS — Z1231 Encounter for screening mammogram for malignant neoplasm of breast: Secondary | ICD-10-CM | POA: Diagnosis not present

## 2017-08-27 ENCOUNTER — Encounter: Payer: Self-pay | Admitting: Podiatry

## 2017-08-27 ENCOUNTER — Ambulatory Visit (INDEPENDENT_AMBULATORY_CARE_PROVIDER_SITE_OTHER): Payer: Medicare Other | Admitting: Podiatry

## 2017-08-27 DIAGNOSIS — B351 Tinea unguium: Secondary | ICD-10-CM

## 2017-08-27 DIAGNOSIS — M79676 Pain in unspecified toe(s): Secondary | ICD-10-CM | POA: Diagnosis not present

## 2017-08-27 DIAGNOSIS — E1142 Type 2 diabetes mellitus with diabetic polyneuropathy: Secondary | ICD-10-CM

## 2017-08-27 NOTE — Progress Notes (Signed)
Complaint:  Visit Type: Patient returns to my office for continued preventative foot care services. Complaint: Patient states" my nails have grown long and thick and become painful to walk and wear shoes" Patient has been diagnosed with DM with no foot complications. The patient presents for preventative foot care services. No changes to ROS.  Patient has been at rehabilitation facility due to fall.  Podiatric Exam: Vascular: dorsalis pedis and posterior tibial pulses are palpable bilateral. Capillary return is immediate. Temperature gradient is WNL. Skin turgor WNL  Sensorium: Normal Semmes Weinstein monofilament test. Normal tactile sensation bilaterally. Nail Exam: Pt has thick disfigured discolored nails with subungual debris noted bilateral entire nail hallux through fifth toenails Ulcer Exam: There is no evidence of ulcer or pre-ulcerative changes or infection. Orthopedic Exam: Muscle tone and strength are WNL. No limitations in general ROM. No crepitus or effusions noted. HAV  B/L with hammer toes 2-4  B/L. Skin: No Porokeratosis. No infection or ulcers.  Clavi 2,3 right..  Diagnosis:  Onychomycosis, , Pain in right toe, pain in left toes  Debride clavi.  Treatment & Plan Procedures and Treatment: Consent by patient was obtained for treatment procedures.   Debridement of mycotic and hypertrophic toenails, 1 through 5 bilateral and clearing of subungual debris. No ulceration, . Return Visit-Office Procedure: Patient instructed to return to the office for a follow up visit 3 months for continued evaluation and treatment.    Gardiner Barefoot DPM

## 2017-09-04 ENCOUNTER — Encounter: Payer: Self-pay | Admitting: Nurse Practitioner

## 2017-09-05 ENCOUNTER — Encounter: Payer: Self-pay | Admitting: Nurse Practitioner

## 2017-09-05 ENCOUNTER — Ambulatory Visit (INDEPENDENT_AMBULATORY_CARE_PROVIDER_SITE_OTHER): Payer: Medicare Other | Admitting: Nurse Practitioner

## 2017-09-05 VITALS — BP 104/50 | HR 69 | Resp 16 | Ht 64.0 in | Wt 199.0 lb

## 2017-09-05 DIAGNOSIS — E1165 Type 2 diabetes mellitus with hyperglycemia: Secondary | ICD-10-CM

## 2017-09-05 DIAGNOSIS — R300 Dysuria: Secondary | ICD-10-CM | POA: Diagnosis not present

## 2017-09-05 DIAGNOSIS — N390 Urinary tract infection, site not specified: Secondary | ICD-10-CM

## 2017-09-05 DIAGNOSIS — I10 Essential (primary) hypertension: Secondary | ICD-10-CM | POA: Diagnosis not present

## 2017-09-05 DIAGNOSIS — F0280 Dementia in other diseases classified elsewhere without behavioral disturbance: Secondary | ICD-10-CM | POA: Diagnosis not present

## 2017-09-05 DIAGNOSIS — F331 Major depressive disorder, recurrent, moderate: Secondary | ICD-10-CM | POA: Diagnosis not present

## 2017-09-05 DIAGNOSIS — G20 Parkinson's disease: Secondary | ICD-10-CM

## 2017-09-05 DIAGNOSIS — Z0001 Encounter for general adult medical examination with abnormal findings: Secondary | ICD-10-CM | POA: Diagnosis not present

## 2017-09-05 LAB — POCT GLYCOSYLATED HEMOGLOBIN (HGB A1C): HEMOGLOBIN A1C: 5.1 % (ref 4.0–5.6)

## 2017-09-05 MED ORDER — MIRTAZAPINE 7.5 MG PO TABS: 7.5000 mg | ORAL_TABLET | Freq: Every day | ORAL | 3 refills | Status: DC

## 2017-09-05 NOTE — Progress Notes (Signed)
[facility] [place], [place] [address]  Internal MEDICINE  Office Visit Note  Patient Name: Theresa Mills  [id number]  [id number]  Date of Service: 09/09/2017   Pt is here for routine health maintenance examination   Chief Complaint  Patient presents with  . Annual Exam    pt is losing wieght fast and have questions want to make sure there is nothing major going on that is making her lose weight this fast  . Fatigue    pt complained of not having any energy  . Pain    left upper eye swollen and tender from fall  . Memory Loss    pt fell on a friday and was foound on a sunday afternoon, pt told caregiver she didnt remember anything, caregiver and family is concerned about memory and if the pt may have bipolar issues, when acregiver arrived pt had moved everything out of place when she got there and had torn up all her chucks.      The patient is feeling very fatigued and run down. Did just spend several weeks in the hospital after a fall. Left eye is still swollen. She is feeling nauseated at times. Can't eat due to nausea. On those days, she will drink a glucerna shake to help get calories and protein.    Current Medication: Outpatient Encounter Medications as of 09/05/2017  Medication Sig  . ALPRAZolam (XANAX) 0.5 MG tablet Take 1 tablet (0.5 mg total) by mouth at bedtime as needed for anxiety. Take 1/2 tablet in the morning and 1 tablet at bedtime as needed.  . Calcium Carbonate-Vitamin D (CALCIUM HIGH POTENCY/VITAMIN D) 600-200 MG-UNIT TABS Take 1 tablet by mouth daily.   . carbidopa-levodopa (SINEMET CR) 50-200 MG tablet Take 1 tablet by mouth 3 (three) times daily.   . citalopram (CELEXA) 40 MG tablet Take 1 tablet (40 mg total) by mouth daily.  Marland Kitchen glucose blood (ONETOUCH VERIO) test strip TEST BLOOD SUGAR BID  . INVOKANA 300 MG TABS Take 300 mg by mouth daily before breakfast.   . metFORMIN (GLUCOPHAGE) 500 MG tablet Take 500 mg by mouth 2 (two)  times daily with a meal.   . Multiple Vitamins-Minerals (MULTIVITAMIN WOMEN 50+ PO) Take 1 tablet by mouth daily.   Marland Kitchen omeprazole (PRILOSEC) 40 MG capsule TAKE 1 CAPSULE BY MOUTH ONCE DAILY  . ONETOUCH DELICA LANCETS 01K MISC TEST BLOOD SUGAR BID  . rOPINIRole (REQUIP XL) 2 MG 24 hr tablet Take 2 mg by mouth at bedtime.   . simvastatin (ZOCOR) 40 MG tablet Take 1 tablet (40 mg total) by mouth at bedtime.  . traMADol (ULTRAM) 50 MG tablet Take 1 tablet (50 mg total) by mouth every 8 (eight) hours as needed for moderate pain.  Marland Kitchen levothyroxine (SYNTHROID, LEVOTHROID) 175 MCG tablet Take 1 tablet (175 mcg total) by mouth daily. (Patient not taking: Reported on 06/10/2017)  . mirtazapine (REMERON) 7.5 MG tablet Take 1 tablet (7.5 mg total) by mouth at bedtime.   No facility-administered encounter medications on file as of 09/05/2017.     Surgical History: Past Surgical History:  Procedure Laterality Date  . ABDOMINAL HYSTERECTOMY    . CATARACT EXTRACTION Bilateral   . CHOLECYSTECTOMY    . COLONOSCOPY WITH PROPOFOL N/A 04/02/2015   Procedure: COLONOSCOPY WITH PROPOFOL;  Surgeon: Josefine Class, MD;  Location: [facility] ENDOSCOPY;  Service: Endoscopy;  Laterality: N/A;  . ESOPHAGOGASTRODUODENOSCOPY (EGD) WITH PROPOFOL N/A 04/02/2015   Procedure: ESOPHAGOGASTRODUODENOSCOPY (EGD) WITH  PROPOFOL;  Surgeon: Josefine Class, MD;  Location: [facility] ENDOSCOPY;  Service: Endoscopy;  Laterality: N/A;  . GALLBLADDER SURGERY    . LAPAROSCOPIC HYSTERECTOMY    . THYROIDECTOMY Left   . TONSILLECTOMY    . TONSILLECTOMY Bilateral     Medical History: Past Medical History:  Diagnosis Date  . Anxiety   . Diabetes ([place])   . Difficult intravenous access   . GERD (gastroesophageal reflux disease)   . HBP (high blood pressure)   . Hyperlipidemia   . Osteoarthritis   . Parkinson disease ([place])   . Sleep apnea   . Thyroid disease     Family History: Family History  Problem Relation Age of Onset  . Asthma  Father   . Cancer Sister   . Stroke Maternal Uncle   . Heart disease Maternal Uncle   . Diabetes Maternal Uncle       Review of Systems  Constitutional: Positive for appetite change, fatigue and unexpected weight change.       Often not feeling hungry.   HENT: Negative for congestion, postnasal drip, rhinorrhea, sinus pressure, sinus pain and sore throat.   Eyes: Negative.   Respiratory: Positive for shortness of breath.   Cardiovascular: Negative for chest pain and palpitations.  Gastrointestinal: Positive for nausea. Negative for abdominal pain, constipation, diarrhea and vomiting.  Endocrine:       History of thyroid disorder, but sees endocrinology. Blood sugars doing well   Genitourinary: Positive for frequency. Negative for dysuria and urgency.  Musculoskeletal: Positive for back pain and myalgias.  Skin: Negative for rash.       Very red and inflamed rash in center of chest and around the left breast.   Allergic/Immunologic: Negative for environmental allergies.  Neurological: Positive for dizziness, tremors and weakness.  Hematological: Negative for adenopathy.  Psychiatric/Behavioral: Positive for dysphoric mood. The patient is nervous/anxious.      Today's Vitals   09/05/17 1044  BP: (!) 104/50  Pulse: 69  Resp: 16  SpO2: 98%  Weight: 199 lb (90.3 kg)  Height: _0  (1.626 m)    Physical Exam  Constitutional: She is oriented to person, place, and time. She appears well-developed and well-nourished. No distress.  HENT:  Head: Normocephalic and atraumatic.  Nose: Nose normal.  Mouth/Throat: Oropharynx is clear and moist. No oropharyngeal exudate.  Eyes: Pupils are equal, round, and reactive to light. Conjunctivae and EOM are normal.  Neck: Normal range of motion. Neck supple. No JVD present. Carotid bruit is not present. No tracheal deviation present. No thyromegaly present.  Cardiovascular: Normal rate and intact distal pulses. An irregular rhythm present.  Exam reveals no gallop and no friction rub.  Murmur heard.  Systolic murmur is present with a grade of 2/6. Pulses:      Dorsalis pedis pulses are 2+ on the right side, and 2+ on the left side.       Posterior tibial pulses are 2+ on the right side, and 2+ on the left side.  Pulmonary/Chest: Effort normal and breath sounds normal. No respiratory distress. She has no wheezes. She has no rales. She exhibits no tenderness.  Abdominal: Soft. Bowel sounds are normal. There is no tenderness.  Musculoskeletal: Normal range of motion.  Feet:  Right Foot:  Protective Sensation: 10 sites tested. 10 sites sensed.  Skin Integrity: Negative for erythema, warmth or dry skin.  Left Foot:  Protective Sensation: 10 sites tested. 10 sites sensed.  Skin Integrity: Negative for erythema, warmth or  dry skin.  Lymphadenopathy:    She has no cervical adenopathy.  Neurological: She is alert and oriented to person, place, and time. No cranial nerve deficit. Coordination normal.  Skin: Skin is warm and dry. Capillary refill takes 2 to 3 seconds. She is not diaphoretic.  Psychiatric: Her speech is normal and behavior is normal. Judgment and thought content normal. Cognition and memory are impaired. She exhibits a depressed mood.  Nursing note and vitals reviewed.    LABS: Recent Results (from the past 2160 hour(s))  Glucose, capillary     Status: Abnormal   Collection Time: 06/11/17  4:28 PM  Result Value Ref Range   Glucose-Capillary 188 (H) 65 - 99 mg/dL   Comment 1 Notify RN   Glucose, capillary     Status: Abnormal   Collection Time: 06/11/17  8:25 PM  Result Value Ref Range   Glucose-Capillary 190 (H) 65 - 99 mg/dL  Basic metabolic panel     Status: Abnormal   Collection Time: 06/12/17  3:37 AM  Result Value Ref Range   Sodium 148 (H) 135 - 145 mmol/L   Potassium 3.4 (L) 3.5 - 5.1 mmol/L   Chloride 116 (H) 101 - 111 mmol/L   CO2 27 22 - 32 mmol/L   Glucose, Bld 136 (H) 65 - 99 mg/dL   BUN 43 (H)  6 - 20 mg/dL   Creatinine, Ser 0.46 0.44 - 1.00 mg/dL   Calcium 8.7 (L) 8.9 - 10.3 mg/dL   GFR calc non Af Amer >60 >60 mL/min   GFR calc Af Amer >60 >60 mL/min    Comment: (NOTE) The eGFR has been calculated using the CKD EPI equation. This calculation has not been validated in all clinical situations. eGFR's persistently <60 mL/min signify possible Chronic Kidney Disease.    Anion gap 5 5 - 15    Comment: Performed at [facility], [address] [place], [place] [address]  CBC     Status: None   Collection Time: 06/12/17  3:37 AM  Result Value Ref Range   WBC 9.7 3.6 - 11.0 K/uL   RBC 4.71 3.80 - 5.20 MIL/uL   Hemoglobin 14.6 12.0 - 16.0 g/dL   HCT 43.6 35.0 - 47.0 %   MCV 92.6 80.0 - 100.0 fL   MCH 31.0 26.0 - 34.0 pg   MCHC 33.5 32.0 - 36.0 g/dL   RDW 12.8 11.5 - 14.5 %   Platelets 161 150 - 440 K/uL    Comment: Performed at [facility], [address] [place], [place] [address]  CK     Status: Abnormal   Collection Time: 06/12/17  3:37 AM  Result Value Ref Range   Total CK 789 (H) 38 - 234 U/L    Comment: Performed at [facility], [address] [place], [place] [address]  Phosphorus     Status: Abnormal   Collection Time: 06/12/17  3:37 AM  Result Value Ref Range   Phosphorus 2.4 (L) 2.5 - 4.6 mg/dL    Comment: Performed at [facility], [address] [place], [place] [address]  Glucose, capillary     Status: Abnormal   Collection Time: 06/12/17  7:21 AM  Result Value Ref Range   Glucose-Capillary 173 (H) 65 - 99 mg/dL  Glucose, capillary     Status: Abnormal   Collection Time: 06/12/17 11:50 AM  Result Value Ref Range   Glucose-Capillary 172 (H) 65 - 99 mg/dL   Comment 1  Notify RN    Comment 2 Document in Chart   Glucose, capillary     Status: None   Collection Time: 06/12/17  5:27 PM  Result Value Ref Range   Glucose-Capillary 89 65 - 99 mg/dL  Glucose, capillary     Status: Abnormal   Collection Time: 06/12/17   8:45 PM  Result Value Ref Range   Glucose-Capillary 131 (H) 65 - 99 mg/dL   Comment 1 Notify RN    Comment 2 Document in Chart   Phosphorus     Status: None   Collection Time: 06/13/17  5:02 AM  Result Value Ref Range   Phosphorus 2.7 2.5 - 4.6 mg/dL    Comment: Performed at [facility], [address] [place], [place] [address]  Basic metabolic panel     Status: Abnormal   Collection Time: 06/13/17  5:02 AM  Result Value Ref Range   Sodium 146 (H) 135 - 145 mmol/L   Potassium 3.7 3.5 - 5.1 mmol/L   Chloride 110 101 - 111 mmol/L   CO2 31 22 - 32 mmol/L   Glucose, Bld 116 (H) 65 - 99 mg/dL   BUN 36 (H) 6 - 20 mg/dL   Creatinine, Ser 0.45 0.44 - 1.00 mg/dL   Calcium 8.5 (L) 8.9 - 10.3 mg/dL   GFR calc non Af Amer >60 >60 mL/min   GFR calc Af Amer >60 >60 mL/min    Comment: (NOTE) The eGFR has been calculated using the CKD EPI equation. This calculation has not been validated in all clinical situations. eGFR's persistently <60 mL/min signify possible Chronic Kidney Disease.    Anion gap 5 5 - 15    Comment: Performed at [facility], [address] [place], [place] [address]  Magnesium     Status: None   Collection Time: 06/13/17  5:02 AM  Result Value Ref Range   Magnesium 2.0 1.7 - 2.4 mg/dL    Comment: Performed at [facility], [address] [place], [place] [address]  Glucose, capillary     Status: Abnormal   Collection Time: 06/13/17  7:55 AM  Result Value Ref Range   Glucose-Capillary 109 (H) 65 - 99 mg/dL   Comment 1 Notify RN    Comment 2 Document in Chart   Glucose, capillary     Status: Abnormal   Collection Time: 06/13/17 12:31 PM  Result Value Ref Range   Glucose-Capillary 157 (H) 65 - 99 mg/dL   Comment 1 Notify RN    Comment 2 Document in Chart   Glucose, capillary     Status: Abnormal   Collection Time: 06/13/17  5:55 PM  Result Value Ref Range   Glucose-Capillary 105 (H) 65 - 99 mg/dL   Comment 1 Notify RN     Comment 2 Document in Chart   Glucose, capillary     Status: Abnormal   Collection Time: 06/13/17  9:06 PM  Result Value Ref Range   Glucose-Capillary 135 (H) 65 - 99 mg/dL   Comment 1 Notify RN    Comment 2 Document in Chart   Basic metabolic panel     Status: Abnormal   Collection Time: 06/14/17  4:42 AM  Result Value Ref Range   Sodium 144 135 - 145 mmol/L   Potassium 3.9 3.5 - 5.1 mmol/L   Chloride 107 101 - 111 mmol/L   CO2 31 22 - 32 mmol/L   Glucose, Bld 111 (H) 65 - 99 mg/dL  BUN 29 (H) 6 - 20 mg/dL   Creatinine, Ser 0.35 (L) 0.44 - 1.00 mg/dL   Calcium 8.2 (L) 8.9 - 10.3 mg/dL   GFR calc non Af Amer >60 >60 mL/min   GFR calc Af Amer >60 >60 mL/min    Comment: (NOTE) The eGFR has been calculated using the CKD EPI equation. This calculation has not been validated in all clinical situations. eGFR's persistently <60 mL/min signify possible Chronic Kidney Disease.    Anion gap 6 5 - 15    Comment: Performed at [facility], [address] [place], [place] [address]  Magnesium     Status: None   Collection Time: 06/14/17  4:42 AM  Result Value Ref Range   Magnesium 1.8 1.7 - 2.4 mg/dL    Comment: Performed at [facility], [address] [place], [place] [address]  Phosphorus     Status: None   Collection Time: 06/14/17  4:42 AM  Result Value Ref Range   Phosphorus 4.2 2.5 - 4.6 mg/dL    Comment: Performed at [facility], [address] [place], [place] [address]  Glucose, capillary     Status: Abnormal   Collection Time: 06/14/17  7:42 AM  Result Value Ref Range   Glucose-Capillary 113 (H) 65 - 99 mg/dL   Comment 1 Notify RN   Glucose, capillary     Status: Abnormal   Collection Time: 06/14/17 12:06 PM  Result Value Ref Range   Glucose-Capillary 128 (H) 65 - 99 mg/dL   Comment 1 Notify RN   POCT HgB A1C     Status: None   Collection Time: 09/05/17 11:28 AM  Result Value Ref Range   Hemoglobin A1C 5.1 4.0 - 5.6 %   HbA1c POC  (<> result, manual entry)     HbA1c, POC (prediabetic range)     HbA1c, POC (controlled diabetic range)    UA/M w/rflx Culture, Routine     Status: Abnormal   Collection Time: 09/05/17  3:35 PM  Result Value Ref Range   Specific Gravity, UA      >=1.030 (A) 1.005 - 1.030   pH, UA 5.0 5.0 - 7.5   Color, UA Yellow Yellow   Appearance Ur Cloudy (A) Clear   Leukocytes, UA Trace (A) Negative   Protein, UA Negative Negative/Trace   Glucose, UA 3+ (A) Negative   Ketones, UA Trace (A) Negative   RBC, UA Negative Negative   Bilirubin, UA Negative Negative   Urobilinogen, Ur 0.2 0.2 - 1.0 mg/dL   Nitrite, UA Positive (A) Negative   Microscopic Examination See below:     Comment: Microscopic was indicated and was performed.   Urinalysis Reflex Comment     Comment: This specimen has reflexed to a Urine Culture.  CULTURE, URINE COMPREHENSIVE     Status: None   Collection Time: 09/05/17  3:35 PM  Result Value Ref Range   Urine Culture, Comprehensive Final report    Organism ID, Bacteria Comment     Comment: Mixed urogenital flora 50,000-100,000 colony forming units per mL   Microscopic Examination     Status: Abnormal   Collection Time: 09/05/17  3:35 PM  Result Value Ref Range   WBC, UA 6-10 (A) 0 - 5 /hpf   RBC, UA 0-2 0 - 2 /hpf   Epithelial Cells (non renal) 0-10 0 - 10 /hpf   Casts None seen None seen /lpf   Mucus, UA Present Not Estab.   Bacteria,  UA Few None seen/Few   Yeast, UA Present (A) None seen  Urine Culture, Reflex     Status: None   Collection Time: 09/05/17  3:35 PM  Result Value Ref Range   Urine Culture, Routine Final report    Organism ID, Bacteria Comment     Comment: Greater than 2 organisms recovered, none predominant. Please submit another sample if clinically indicated. Greater than 100,000 colony forming units per mL     Assessment/Plan: 1. Encounter for general adult medical examination with abnormal findings Annual wellness visit today.   2.  Uncontrolled type 2 diabetes mellitus with hyperglycemia (HCC) - POCT HgB A1C 5.1 today. Continue diabetic medication as prescribed. Refer for diabetic eye exam.  - Ambulatory referral to Ophthalmology  3. Essential hypertension Stable. Continue bp medication as prescribed   4. Moderate episode of recurrent major depressive disorder (HCC) Add remeron 7.35m in the evening to improve depression/anxiety. - mirtazapine (REMERON) 7.5 MG tablet; Take 1 tablet (7.5 mg total) by mouth at bedtime.  Dispense: 30 tablet; Refill: 3  5. Dementia due to Parkinson's disease without behavioral disturbance ([place] Continue regular visits with neurology as scheduled.   6. Dysuria - UA/M w/rflx Culture, Routine - POCT Urinalysis Dipstick - CULTURE, URINE COMPREHENSIVE  General Counseling: MDaliyaverbalizes understanding of the findings of todays visit and agrees with plan of treatment. I have discussed any further diagnostic evaluation that may be needed or ordered today. We also reviewed her medications today. she has been encouraged to call the office with any questions or concerns that should arise related to todays visit.    Counseling:  Diabetes Counseling:  1. Addition of ACE inh/ ARB'S for nephroprotection. Microalbumin is updated  2. Diabetic foot care, prevention of complications. Podiatry consult 3. Exercise and lose weight.  4. Diabetic eye examination, Diabetic eye exam is updated  5. Monitor blood sugar closlely. nutrition counseling.  6. Sign and symptoms of hypoglycemia including shaking sweating,confusion and headaches.  This patient was seen by HLeretha PolFNP Collaboration with Dr FLavera Guiseas a part of collaborative care agreement  Orders Placed This Encounter  Procedures  . CULTURE, URINE COMPREHENSIVE  . Microscopic Examination  . Urine Culture, Reflex  . UA/M w/rflx Culture, Routine  . Ambulatory referral to Ophthalmology  . POCT HgB A1C  . POCT Urinalysis Dipstick     Meds ordered this encounter  Medications  . mirtazapine (REMERON) 7.5 MG tablet    Sig: Take 1 tablet (7.5 mg total) by mouth at bedtime.    Dispense:  30 tablet    Refill:  3    Order Specific Question:   Supervising Provider    Answer:   KLavera Guise[[[id number]]   Time spent: 4[place] MD  Internal Medicine

## 2017-09-06 ENCOUNTER — Telehealth: Payer: Self-pay

## 2017-09-06 DIAGNOSIS — I10 Essential (primary) hypertension: Secondary | ICD-10-CM | POA: Diagnosis not present

## 2017-09-06 DIAGNOSIS — S22030D Wedge compression fracture of third thoracic vertebra, subsequent encounter for fracture with routine healing: Secondary | ICD-10-CM | POA: Diagnosis not present

## 2017-09-06 DIAGNOSIS — E119 Type 2 diabetes mellitus without complications: Secondary | ICD-10-CM | POA: Diagnosis not present

## 2017-09-06 DIAGNOSIS — S0512XD Contusion of eyeball and orbital tissues, left eye, subsequent encounter: Secondary | ICD-10-CM | POA: Diagnosis not present

## 2017-09-06 DIAGNOSIS — S2249XD Multiple fractures of ribs, unspecified side, subsequent encounter for fracture with routine healing: Secondary | ICD-10-CM | POA: Diagnosis not present

## 2017-09-06 DIAGNOSIS — S0240DD Maxillary fracture, left side, subsequent encounter for fracture with routine healing: Secondary | ICD-10-CM | POA: Diagnosis not present

## 2017-09-06 NOTE — Telephone Encounter (Signed)
Spoke with Ceasar from [facility] and gave verbal agreement for physical thereapy for pt 1 time this week and 2 times a week for the next 3 weeks per DFK. Ceasar [phone]

## 2017-09-07 DIAGNOSIS — S22030D Wedge compression fracture of third thoracic vertebra, subsequent encounter for fracture with routine healing: Secondary | ICD-10-CM | POA: Diagnosis not present

## 2017-09-07 DIAGNOSIS — S0512XD Contusion of eyeball and orbital tissues, left eye, subsequent encounter: Secondary | ICD-10-CM | POA: Diagnosis not present

## 2017-09-07 DIAGNOSIS — I10 Essential (primary) hypertension: Secondary | ICD-10-CM | POA: Diagnosis not present

## 2017-09-07 DIAGNOSIS — S2249XD Multiple fractures of ribs, unspecified side, subsequent encounter for fracture with routine healing: Secondary | ICD-10-CM | POA: Diagnosis not present

## 2017-09-07 DIAGNOSIS — E119 Type 2 diabetes mellitus without complications: Secondary | ICD-10-CM | POA: Diagnosis not present

## 2017-09-07 DIAGNOSIS — S0240DD Maxillary fracture, left side, subsequent encounter for fracture with routine healing: Secondary | ICD-10-CM | POA: Diagnosis not present

## 2017-09-07 LAB — MICROSCOPIC EXAMINATION: CASTS: NONE SEEN /LPF

## 2017-09-07 LAB — UA/M W/RFLX CULTURE, ROUTINE
Bilirubin, UA: NEGATIVE
Nitrite, UA: POSITIVE — AB
PH UA: 5 (ref 5.0–7.5)
Protein, UA: NEGATIVE
RBC, UA: NEGATIVE
Specific Gravity, UA: >=1.03 — AB (ref 1.005–1.030)
Urobilinogen, Ur: 0.2 mg/dL (ref 0.2–1.0)

## 2017-09-07 LAB — URINE CULTURE, REFLEX

## 2017-09-08 DIAGNOSIS — S0512XD Contusion of eyeball and orbital tissues, left eye, subsequent encounter: Secondary | ICD-10-CM | POA: Diagnosis not present

## 2017-09-08 DIAGNOSIS — I10 Essential (primary) hypertension: Secondary | ICD-10-CM | POA: Diagnosis not present

## 2017-09-08 DIAGNOSIS — E119 Type 2 diabetes mellitus without complications: Secondary | ICD-10-CM | POA: Diagnosis not present

## 2017-09-08 DIAGNOSIS — S2249XD Multiple fractures of ribs, unspecified side, subsequent encounter for fracture with routine healing: Secondary | ICD-10-CM | POA: Diagnosis not present

## 2017-09-08 DIAGNOSIS — S0240DD Maxillary fracture, left side, subsequent encounter for fracture with routine healing: Secondary | ICD-10-CM | POA: Diagnosis not present

## 2017-09-08 DIAGNOSIS — S22030D Wedge compression fracture of third thoracic vertebra, subsequent encounter for fracture with routine healing: Secondary | ICD-10-CM | POA: Diagnosis not present

## 2017-09-08 LAB — CULTURE, URINE COMPREHENSIVE

## 2017-09-09 MED ORDER — CIPROFLOXACIN HCL 500 MG PO TABS: 500.0000 mg | ORAL_TABLET | Freq: Two times a day (BID) | ORAL | 0 refills | Status: DC

## 2017-09-09 NOTE — Progress Notes (Signed)
Please let the patient know that the urine, taken at her physical was positive for urinary tract infection. I sent in prescription for ciprofloxacin which should be taken twice daily for next 10 days.  Thanks.

## 2017-09-10 ENCOUNTER — Telehealth: Payer: Self-pay

## 2017-09-10 NOTE — Telephone Encounter (Signed)
Informed pt of urine results and let her know her prescription was at her parmacy

## 2017-09-10 NOTE — Telephone Encounter (Signed)
-----   Message from Ronnell Freshwater, NP sent at 09/09/2017  4:27 PM EDT ----- Please let the patient know that the urine, taken at her physical was positive for urinary tract infection. I sent in prescription for ciprofloxacin which should be taken twice daily for next 10 days.  Thanks.

## 2017-09-12 DIAGNOSIS — S0512XD Contusion of eyeball and orbital tissues, left eye, subsequent encounter: Secondary | ICD-10-CM | POA: Diagnosis not present

## 2017-09-12 DIAGNOSIS — S22030D Wedge compression fracture of third thoracic vertebra, subsequent encounter for fracture with routine healing: Secondary | ICD-10-CM | POA: Diagnosis not present

## 2017-09-12 DIAGNOSIS — S2249XD Multiple fractures of ribs, unspecified side, subsequent encounter for fracture with routine healing: Secondary | ICD-10-CM | POA: Diagnosis not present

## 2017-09-12 DIAGNOSIS — I10 Essential (primary) hypertension: Secondary | ICD-10-CM | POA: Diagnosis not present

## 2017-09-12 DIAGNOSIS — S0240DD Maxillary fracture, left side, subsequent encounter for fracture with routine healing: Secondary | ICD-10-CM | POA: Diagnosis not present

## 2017-09-12 DIAGNOSIS — E119 Type 2 diabetes mellitus without complications: Secondary | ICD-10-CM | POA: Diagnosis not present

## 2017-09-15 DIAGNOSIS — S0512XD Contusion of eyeball and orbital tissues, left eye, subsequent encounter: Secondary | ICD-10-CM | POA: Diagnosis not present

## 2017-09-15 DIAGNOSIS — S2249XD Multiple fractures of ribs, unspecified side, subsequent encounter for fracture with routine healing: Secondary | ICD-10-CM | POA: Diagnosis not present

## 2017-09-15 DIAGNOSIS — S22030D Wedge compression fracture of third thoracic vertebra, subsequent encounter for fracture with routine healing: Secondary | ICD-10-CM | POA: Diagnosis not present

## 2017-09-15 DIAGNOSIS — E119 Type 2 diabetes mellitus without complications: Secondary | ICD-10-CM | POA: Diagnosis not present

## 2017-09-15 DIAGNOSIS — S0240DD Maxillary fracture, left side, subsequent encounter for fracture with routine healing: Secondary | ICD-10-CM | POA: Diagnosis not present

## 2017-09-15 DIAGNOSIS — I10 Essential (primary) hypertension: Secondary | ICD-10-CM | POA: Diagnosis not present

## 2017-09-18 DIAGNOSIS — G2581 Restless legs syndrome: Secondary | ICD-10-CM | POA: Diagnosis not present

## 2017-09-18 DIAGNOSIS — F411 Generalized anxiety disorder: Secondary | ICD-10-CM | POA: Diagnosis not present

## 2017-09-18 DIAGNOSIS — G20 Parkinson's disease: Secondary | ICD-10-CM | POA: Diagnosis not present

## 2017-09-18 DIAGNOSIS — F5101 Primary insomnia: Secondary | ICD-10-CM | POA: Diagnosis not present

## 2017-09-18 DIAGNOSIS — F0280 Dementia in other diseases classified elsewhere without behavioral disturbance: Secondary | ICD-10-CM | POA: Diagnosis not present

## 2017-09-19 DIAGNOSIS — S0512XD Contusion of eyeball and orbital tissues, left eye, subsequent encounter: Secondary | ICD-10-CM | POA: Diagnosis not present

## 2017-09-19 DIAGNOSIS — S2249XD Multiple fractures of ribs, unspecified side, subsequent encounter for fracture with routine healing: Secondary | ICD-10-CM | POA: Diagnosis not present

## 2017-09-19 DIAGNOSIS — I10 Essential (primary) hypertension: Secondary | ICD-10-CM | POA: Diagnosis not present

## 2017-09-19 DIAGNOSIS — S22030D Wedge compression fracture of third thoracic vertebra, subsequent encounter for fracture with routine healing: Secondary | ICD-10-CM | POA: Diagnosis not present

## 2017-09-19 DIAGNOSIS — S0240DD Maxillary fracture, left side, subsequent encounter for fracture with routine healing: Secondary | ICD-10-CM | POA: Diagnosis not present

## 2017-09-19 DIAGNOSIS — E119 Type 2 diabetes mellitus without complications: Secondary | ICD-10-CM | POA: Diagnosis not present

## 2017-09-28 DIAGNOSIS — E119 Type 2 diabetes mellitus without complications: Secondary | ICD-10-CM | POA: Diagnosis not present

## 2017-09-28 DIAGNOSIS — S0512XD Contusion of eyeball and orbital tissues, left eye, subsequent encounter: Secondary | ICD-10-CM | POA: Diagnosis not present

## 2017-09-28 DIAGNOSIS — S22030D Wedge compression fracture of third thoracic vertebra, subsequent encounter for fracture with routine healing: Secondary | ICD-10-CM | POA: Diagnosis not present

## 2017-09-28 DIAGNOSIS — S2249XD Multiple fractures of ribs, unspecified side, subsequent encounter for fracture with routine healing: Secondary | ICD-10-CM | POA: Diagnosis not present

## 2017-09-28 DIAGNOSIS — S0240DD Maxillary fracture, left side, subsequent encounter for fracture with routine healing: Secondary | ICD-10-CM | POA: Diagnosis not present

## 2017-09-28 DIAGNOSIS — I10 Essential (primary) hypertension: Secondary | ICD-10-CM | POA: Diagnosis not present

## 2017-10-01 ENCOUNTER — Other Ambulatory Visit: Payer: Self-pay | Admitting: Nurse Practitioner

## 2017-10-01 DIAGNOSIS — F411 Generalized anxiety disorder: Secondary | ICD-10-CM

## 2017-10-01 MED ORDER — ALPRAZOLAM 0.5 MG PO TABS: ORAL_TABLET | ORAL | 2 refills | Status: DC

## 2017-10-01 NOTE — Progress Notes (Signed)
Renewed patient's alprazolam per pharmacy request

## 2017-10-17 ENCOUNTER — Ambulatory Visit: Payer: Self-pay

## 2017-10-22 ENCOUNTER — Ambulatory Visit: Payer: Self-pay

## 2017-10-22 DIAGNOSIS — E1165 Type 2 diabetes mellitus with hyperglycemia: Secondary | ICD-10-CM | POA: Diagnosis not present

## 2017-10-25 DIAGNOSIS — G3183 Dementia with Lewy bodies: Secondary | ICD-10-CM | POA: Diagnosis not present

## 2017-10-25 DIAGNOSIS — E785 Hyperlipidemia, unspecified: Secondary | ICD-10-CM | POA: Diagnosis not present

## 2017-10-25 DIAGNOSIS — E039 Hypothyroidism, unspecified: Secondary | ICD-10-CM | POA: Diagnosis not present

## 2017-10-25 DIAGNOSIS — E668 Other obesity: Secondary | ICD-10-CM | POA: Diagnosis not present

## 2017-10-25 DIAGNOSIS — E1165 Type 2 diabetes mellitus with hyperglycemia: Secondary | ICD-10-CM | POA: Diagnosis not present

## 2017-10-25 DIAGNOSIS — Z6833 Body mass index (BMI) 33.0-33.9, adult: Secondary | ICD-10-CM | POA: Diagnosis not present

## 2017-10-25 DIAGNOSIS — M159 Polyosteoarthritis, unspecified: Secondary | ICD-10-CM | POA: Diagnosis not present

## 2017-10-25 DIAGNOSIS — C73 Malignant neoplasm of thyroid gland: Secondary | ICD-10-CM | POA: Diagnosis not present

## 2017-10-25 DIAGNOSIS — E063 Autoimmune thyroiditis: Secondary | ICD-10-CM | POA: Diagnosis not present

## 2017-10-30 DIAGNOSIS — Z23 Encounter for immunization: Secondary | ICD-10-CM | POA: Diagnosis not present

## 2017-11-08 DIAGNOSIS — E119 Type 2 diabetes mellitus without complications: Secondary | ICD-10-CM | POA: Diagnosis not present

## 2017-11-12 ENCOUNTER — Ambulatory Visit: Payer: Self-pay | Admitting: Internal Medicine

## 2017-11-29 ENCOUNTER — Ambulatory Visit (INDEPENDENT_AMBULATORY_CARE_PROVIDER_SITE_OTHER): Payer: Medicare Other | Admitting: Podiatry

## 2017-11-29 ENCOUNTER — Encounter: Payer: Self-pay | Admitting: Podiatry

## 2017-11-29 DIAGNOSIS — E1142 Type 2 diabetes mellitus with diabetic polyneuropathy: Secondary | ICD-10-CM

## 2017-11-29 DIAGNOSIS — B351 Tinea unguium: Secondary | ICD-10-CM | POA: Diagnosis not present

## 2017-11-29 DIAGNOSIS — M79676 Pain in unspecified toe(s): Secondary | ICD-10-CM

## 2017-11-29 NOTE — Progress Notes (Signed)
Complaint:  Visit Type: Patient returns to my office for continued preventative foot care services. Complaint: Patient states" my nails have grown long and thick and become painful to walk and wear shoes" Patient has been diagnosed with DM with no foot complications. The patient presents for preventative foot care services. No changes to ROS.  Patient has been at rehabilitation facility due to fall.  Podiatric Exam: Vascular: dorsalis pedis and posterior tibial pulses are palpable bilateral. Capillary return is immediate. Temperature gradient is WNL. Skin turgor WNL  Sensorium: Normal Semmes Weinstein monofilament test. Normal tactile sensation bilaterally. Nail Exam: Pt has thick disfigured discolored nails with subungual debris noted bilateral entire nail hallux through fifth toenails except right hallux. Ulcer Exam: There is no evidence of ulcer or pre-ulcerative changes or infection. Orthopedic Exam: Muscle tone and strength are WNL. No limitations in general ROM. No crepitus or effusions noted. HAV  B/L with hammer toes 2-4  B/L. Skin: No Porokeratosis. No infection or ulcers.  Clavi 2,3 right..  Diagnosis:  Onychomycosis, , Pain in right toe, pain in left toes  Debride clavi.  Treatment & Plan Procedures and Treatment: Consent by patient was obtained for treatment procedures.   Debridement of mycotic and hypertrophic toenails, 1 through 5 bilateral and clearing of subungual debris. No ulceration, . Return Visit-Office Procedure: Patient instructed to return to the office for a follow up visit 3 months for continued evaluation and treatment.    Gardiner Barefoot DPM

## 2017-12-10 ENCOUNTER — Ambulatory Visit: Payer: Self-pay | Admitting: Nurse Practitioner

## 2018-01-14 ENCOUNTER — Ambulatory Visit: Payer: Self-pay | Admitting: Nurse Practitioner

## 2018-01-18 DIAGNOSIS — E063 Autoimmune thyroiditis: Secondary | ICD-10-CM | POA: Diagnosis not present

## 2018-01-18 DIAGNOSIS — E1165 Type 2 diabetes mellitus with hyperglycemia: Secondary | ICD-10-CM | POA: Diagnosis not present

## 2018-01-18 DIAGNOSIS — E559 Vitamin D deficiency, unspecified: Secondary | ICD-10-CM | POA: Diagnosis not present

## 2018-01-18 DIAGNOSIS — E039 Hypothyroidism, unspecified: Secondary | ICD-10-CM | POA: Diagnosis not present

## 2018-01-21 ENCOUNTER — Encounter: Payer: Self-pay | Admitting: Nurse Practitioner

## 2018-01-21 ENCOUNTER — Ambulatory Visit (INDEPENDENT_AMBULATORY_CARE_PROVIDER_SITE_OTHER): Payer: Medicare Other | Admitting: Nurse Practitioner

## 2018-01-21 VITALS — BP 118/76 | HR 61 | Resp 16 | Ht 64.0 in | Wt 212.4 lb

## 2018-01-21 DIAGNOSIS — E119 Type 2 diabetes mellitus without complications: Secondary | ICD-10-CM

## 2018-01-21 DIAGNOSIS — G20 Parkinson's disease: Secondary | ICD-10-CM

## 2018-01-21 DIAGNOSIS — F411 Generalized anxiety disorder: Secondary | ICD-10-CM

## 2018-01-21 DIAGNOSIS — E079 Disorder of thyroid, unspecified: Secondary | ICD-10-CM | POA: Diagnosis not present

## 2018-01-21 LAB — POCT GLYCOSYLATED HEMOGLOBIN (HGB A1C): HEMOGLOBIN A1C: 5.7 % — AB (ref 4.0–5.6)

## 2018-01-21 MED ORDER — ALPRAZOLAM 0.5 MG PO TABS: ORAL_TABLET | ORAL | 2 refills | Status: DC

## 2018-01-21 NOTE — Progress Notes (Signed)
[facility] [place], [place] [address]  Internal MEDICINE  Office Visit Note  Patient Name: Theresa Mills  [id number]  [id number]  Date of Service: 01/21/2018  Chief Complaint  Patient presents with  . Medical Management of Chronic Issues    3 month follow up  . Diabetes    Diabetes  She presents for her follow-up diabetic visit. She has type 2 diabetes mellitus. Her disease course has been stable. There are no hypoglycemic associated symptoms. Associated symptoms include fatigue and weakness. Pertinent negatives for diabetes include no chest pain. There are no hypoglycemic complications. Symptoms are stable. There are no diabetic complications. Risk factors for coronary artery disease include diabetes mellitus, dyslipidemia and sedentary lifestyle. Current diabetic treatment includes oral agent (monotherapy). She is compliant with treatment all of the time. Her weight is stable. She is following a generally healthy diet. She has not had a previous visit with a dietitian. She participates in exercise intermittently. There is no change in her home blood glucose trend. An ACE inhibitor/angiotensin II receptor blocker is not being taken. She does not see a podiatrist.Eye exam is current.       Current Medication: Outpatient Encounter Medications as of 01/21/2018  Medication Sig Note  . ALPRAZolam (XANAX) 0.5 MG tablet Take 1/2 tablet in the morning and 1 tablet at bedtime as needed.   . Calcium Carbonate-Vitamin D (CALCIUM HIGH POTENCY/VITAMIN D) 600-200 MG-UNIT TABS Take 1 tablet by mouth daily.    . carbidopa-levodopa (SINEMET IR) 25-250 MG tablet    . citalopram (CELEXA) 40 MG tablet Take 1 tablet (40 mg total) by mouth daily.   Marland Kitchen glucose blood (ONETOUCH VERIO) test strip TEST BLOOD SUGAR BID   . levothyroxine (SYNTHROID, LEVOTHROID) 175 MCG tablet Take 1 tablet (175 mcg total) by mouth daily.   . metFORMIN (GLUCOPHAGE) 500 MG tablet Take 500 mg by mouth 2  (two) times daily with a meal.    . mirtazapine (REMERON) 7.5 MG tablet Take 1 tablet (7.5 mg total) by mouth at bedtime.   . Multiple Vitamins-Minerals (MULTIVITAMIN WOMEN 50+ PO) Take 1 tablet by mouth daily.    Marland Kitchen omeprazole (PRILOSEC) 40 MG capsule TAKE 1 CAPSULE BY MOUTH ONCE DAILY   . ONETOUCH DELICA LANCETS 09B MISC TEST BLOOD SUGAR BID   . rOPINIRole (REQUIP XL) 2 MG 24 hr tablet Take 2 mg by mouth at bedtime.    . sertraline (ZOLOFT) 50 MG tablet Take by mouth.   . simvastatin (ZOCOR) 40 MG tablet Take 1 tablet (40 mg total) by mouth at bedtime.   . [DISCONTINUED] ALPRAZolam (XANAX) 0.5 MG tablet Take 1/2 tablet in the morning and 1 tablet at bedtime as needed.   . ciprofloxacin (CIPRO) 500 MG tablet Take 1 tablet (500 mg total) by mouth 2 (two) times daily. (Patient not taking: Reported on 01/21/2018)   . traMADol (ULTRAM) 50 MG tablet Take 1 tablet (50 mg total) by mouth every 8 (eight) hours as needed for moderate pain. (Patient not taking: Reported on 01/21/2018)   . [DISCONTINUED] INVOKANA 300 MG TABS Take 300 mg by mouth daily before breakfast.  01/21/2018: per Dr. Arbutus Ped.   No facility-administered encounter medications on file as of 01/21/2018.     Surgical History: Past Surgical History:  Procedure Laterality Date  . ABDOMINAL HYSTERECTOMY    . CATARACT EXTRACTION Bilateral   . CHOLECYSTECTOMY    . COLONOSCOPY WITH PROPOFOL N/A 04/02/2015   Procedure: COLONOSCOPY WITH PROPOFOL;  Surgeon:  Josefine Class, MD;  Location: [facility] ENDOSCOPY;  Service: Endoscopy;  Laterality: N/A;  . ESOPHAGOGASTRODUODENOSCOPY (EGD) WITH PROPOFOL N/A 04/02/2015   Procedure: ESOPHAGOGASTRODUODENOSCOPY (EGD) WITH PROPOFOL;  Surgeon: Josefine Class, MD;  Location: [facility] ENDOSCOPY;  Service: Endoscopy;  Laterality: N/A;  . GALLBLADDER SURGERY    . LAPAROSCOPIC HYSTERECTOMY    . THYROIDECTOMY Left   . TONSILLECTOMY    . TONSILLECTOMY Bilateral     Medical History: Past Medical History:   Diagnosis Date  . Anxiety   . Diabetes ([place])   . Difficult intravenous access   . GERD (gastroesophageal reflux disease)   . HBP (high blood pressure)   . Hyperlipidemia   . Osteoarthritis   . Parkinson disease ([place])   . Sleep apnea   . Thyroid disease     Family History: Family History  Problem Relation Age of Onset  . Asthma Father   . Cancer Sister   . Stroke Maternal Uncle   . Heart disease Maternal Uncle   . Diabetes Maternal Uncle     Social History   Socioeconomic History  . Marital status: Widowed    Spouse name: Not on file  . Number of children: Not on file  . Years of education: Not on file  . Highest education level: Not on file  Occupational History  . Not on file  Social Needs  . Financial resource strain: Not on file  . Food insecurity:    Worry: Not on file    Inability: Not on file  . Transportation needs:    Medical: Not on file    Non-medical: Not on file  Tobacco Use  . Smoking status: Never Smoker  . Smokeless tobacco: Never Used  Substance and Sexual Activity  . Alcohol use: No    Comment: OCCASIONALLY  . Drug use: No  . Sexual activity: Not on file  Lifestyle  . Physical activity:    Days per week: Not on file    Minutes per session: Not on file  . Stress: Not on file  Relationships  . Social connections:    Talks on phone: Not on file    Gets together: Not on file    Attends religious service: Not on file    Active member of club or organization: Not on file    Attends meetings of clubs or organizations: Not on file    Relationship status: Not on file  . Intimate partner violence:    Fear of current or ex partner: Not on file    Emotionally abused: Not on file    Physically abused: Not on file    Forced sexual activity: Not on file  Other Topics Concern  . Not on file  Social History Narrative  . Not on file      Review of Systems  Constitutional: Positive for fatigue. Negative for appetite change and unexpected  weight change.       Improved appetite  HENT: Negative for congestion, postnasal drip, rhinorrhea, sinus pressure, sinus pain and sore throat.   Respiratory: Positive for shortness of breath.   Cardiovascular: Negative for chest pain and palpitations.  Gastrointestinal: Negative for abdominal pain, constipation, diarrhea, nausea and vomiting.  Endocrine:       Sees endocrinology for thyroid disorder. Labs indicated hyperthyroid. Dose of levothyroxine recently decreased. Taken off invokana. Doing well. Improved energy levels.   Genitourinary: Negative for dysuria, frequency and urgency.  Musculoskeletal: Positive for back pain and myalgias.  Skin: Negative for rash.  Allergic/Immunologic: Negative for environmental allergies.  Neurological: Positive for weakness.  Hematological: Negative for adenopathy.  Psychiatric/Behavioral: Positive for dysphoric mood.    Vital Signs: BP 118/76 (BP Location: Left Arm, Patient Position: Sitting, Cuff Size: Large)   Pulse 61   Resp 16   Ht 5\' 4"  (1.626 m)   Wt 212 lb 6.4 oz (96.3 kg)   SpO2 95%   BMI 36.46 kg/m    Physical Exam Vitals signs and nursing note reviewed.  Constitutional:      General: She is not in acute distress.    Appearance: Normal appearance. She is well-developed. She is not diaphoretic.  HENT:     Head: Normocephalic and atraumatic.     Mouth/Throat:     Pharynx: No oropharyngeal exudate.  Eyes:     Extraocular Movements: Extraocular movements intact.     Conjunctiva/sclera: Conjunctivae normal.     Pupils: Pupils are equal, round, and reactive to light.  Neck:     Musculoskeletal: Normal range of motion and neck supple.     Thyroid: No thyromegaly.     Vascular: No JVD.     Trachea: No tracheal deviation.  Cardiovascular:     Rate and Rhythm: Normal rate and regular rhythm.     Heart sounds: Murmur present. Systolic murmur present with a grade of 2/6. No friction rub. No gallop.   Pulmonary:     Effort:  Pulmonary effort is normal. No respiratory distress.     Breath sounds: Normal breath sounds. No wheezing or rales.  Chest:     Chest wall: No tenderness.  Abdominal:     General: Bowel sounds are normal.     Palpations: Abdomen is soft.     Tenderness: There is no abdominal tenderness.  Musculoskeletal: Normal range of motion.     Comments: Uses a walker to help with ambulation.   Lymphadenopathy:     Cervical: No cervical adenopathy.  Skin:    General: Skin is warm and dry.     Capillary Refill: Capillary refill takes 2 to 3 seconds.  Neurological:     Mental Status: She is alert and oriented to person, place, and time. Mental status is at baseline.     Cranial Nerves: No cranial nerve deficit.     Coordination: Coordination normal.  Psychiatric:        Behavior: Behavior normal.        Thought Content: Thought content normal.        Judgment: Judgment normal.   Assessment/Plan: 1. Type 2 diabetes mellitus without complication, without long-term current use of insulin (HCC) - POCT HgB A1C 5.7 today. invokana discontinued per endocrinology. Doing well. Continue metformin as prescribed.   2. Anxiety, generalized Continue sertraline and mirtazapine as prescribed. May continue taking alprazolam 0.5mg  at bedtime as needed. New prescription sent to her pharmacy  - ALPRAZolam (XANAX) 0.5 MG tablet; Take 1/2 tablet in the morning and 1 tablet at bedtime as needed.  Dispense: 45 tablet; Refill: 2  3. Thyroid disease Update dose of levothyroxine. Continue regular visits with endocrinology as scheduled.   4. Parkinson disease ([place]) Continue sinemet as prescribed   General Counseling: Meiling verbalizes understanding of the findings of todays visit and agrees with plan of treatment. I have discussed any further diagnostic evaluation that may be needed or ordered today. We also reviewed her medications today. she has been encouraged to call the office with any questions or concerns that  should arise related to todays visit.  Diabetes Counseling:  1. Addition of ACE inh/ ARB'S for nephroprotection. Microalbumin is updated  2. Diabetic foot care, prevention of complications. Podiatry consult 3. Exercise and lose weight.  4. Diabetic eye examination, Diabetic eye exam is updated  5. Monitor blood sugar closlely. nutrition counseling.  6. Sign and symptoms of hypoglycemia including shaking sweating,confusion and headaches.   This patient was seen by Leretha Pol FNP Collaboration with Dr Lavera Guise as a part of collaborative care agreement  Orders Placed This Encounter  Procedures  . POCT HgB A1C    Meds ordered this encounter  Medications  . ALPRAZolam (XANAX) 0.5 MG tablet    Sig: Take 1/2 tablet in the morning and 1 tablet at bedtime as needed.    Dispense:  45 tablet    Refill:  2    Order Specific Question:   Supervising Provider    Answer:   Lavera Guise [[id number]]    Time spent: 69 Minutes      Dr Lavera Guise Internal medicine

## 2018-01-29 DIAGNOSIS — E1140 Type 2 diabetes mellitus with diabetic neuropathy, unspecified: Secondary | ICD-10-CM | POA: Diagnosis not present

## 2018-01-29 DIAGNOSIS — E785 Hyperlipidemia, unspecified: Secondary | ICD-10-CM | POA: Diagnosis not present

## 2018-01-29 DIAGNOSIS — E1165 Type 2 diabetes mellitus with hyperglycemia: Secondary | ICD-10-CM | POA: Diagnosis not present

## 2018-01-29 DIAGNOSIS — Z6835 Body mass index (BMI) 35.0-35.9, adult: Secondary | ICD-10-CM | POA: Diagnosis not present

## 2018-01-29 DIAGNOSIS — C73 Malignant neoplasm of thyroid gland: Secondary | ICD-10-CM | POA: Diagnosis not present

## 2018-01-29 DIAGNOSIS — G3183 Dementia with Lewy bodies: Secondary | ICD-10-CM | POA: Diagnosis not present

## 2018-01-29 DIAGNOSIS — E063 Autoimmune thyroiditis: Secondary | ICD-10-CM | POA: Diagnosis not present

## 2018-01-29 DIAGNOSIS — M159 Polyosteoarthritis, unspecified: Secondary | ICD-10-CM | POA: Diagnosis not present

## 2018-01-29 DIAGNOSIS — E039 Hypothyroidism, unspecified: Secondary | ICD-10-CM | POA: Diagnosis not present

## 2018-01-29 DIAGNOSIS — E668 Other obesity: Secondary | ICD-10-CM | POA: Diagnosis not present

## 2018-02-25 ENCOUNTER — Encounter: Payer: Self-pay | Admitting: Podiatry

## 2018-02-25 ENCOUNTER — Ambulatory Visit (INDEPENDENT_AMBULATORY_CARE_PROVIDER_SITE_OTHER): Payer: Medicare Other | Admitting: Podiatry

## 2018-02-25 DIAGNOSIS — L84 Corns and callosities: Secondary | ICD-10-CM | POA: Diagnosis not present

## 2018-02-25 DIAGNOSIS — B351 Tinea unguium: Secondary | ICD-10-CM | POA: Diagnosis not present

## 2018-02-25 DIAGNOSIS — E1142 Type 2 diabetes mellitus with diabetic polyneuropathy: Secondary | ICD-10-CM

## 2018-02-25 DIAGNOSIS — M79676 Pain in unspecified toe(s): Secondary | ICD-10-CM

## 2018-02-25 NOTE — Progress Notes (Signed)
Complaint:  Visit Type: Patient returns to my office for continued preventative foot care services. Complaint: Patient states" my nails have grown long and thick and become painful to walk and wear shoes" Patient has been diagnosed with DM with no foot complications. The patient presents for preventative foot care services. No changes to ROS.    Podiatric Exam: Vascular: dorsalis pedis and posterior tibial pulses are palpable bilateral. Capillary return is immediate. Temperature gradient is WNL. Skin turgor WNL  Sensorium: Normal Semmes Weinstein monofilament test. Normal tactile sensation bilaterally. Nail Exam: Pt has thick disfigured discolored nails with subungual debris noted bilateral entire nail hallux through fifth toenails except right hallux. Ulcer Exam: There is no evidence of ulcer or pre-ulcerative changes or infection. Orthopedic Exam: Muscle tone and strength are WNL. No limitations in general ROM. No crepitus or effusions noted. HAV  B/L with hammer toes 2-4  B/L. Skin: No Porokeratosis. No infection or ulcers.  Clavi 2,3 right..  Diagnosis:  Onychomycosis, , Pain in right toe, pain in left toes  Debride clavi.  Treatment & Plan Procedures and Treatment: Consent by patient was obtained for treatment procedures.   Debridement of mycotic and hypertrophic toenails, 1 through 5 bilateral and clearing of subungual debris. No ulceration, . Return Visit-Office Procedure: Patient instructed to return to the office for a follow up visit 3 months for continued evaluation and treatment.    Gardiner Barefoot DPM

## 2018-02-28 ENCOUNTER — Ambulatory Visit: Payer: Medicare Other | Admitting: Podiatry

## 2018-03-28 ENCOUNTER — Ambulatory Visit (INDEPENDENT_AMBULATORY_CARE_PROVIDER_SITE_OTHER): Payer: Medicare Other | Admitting: Adult Health

## 2018-03-28 ENCOUNTER — Encounter: Payer: Self-pay | Admitting: Adult Health

## 2018-03-28 VITALS — BP 116/68 | HR 67 | Temp 98.4°F | Resp 16 | Ht 64.0 in | Wt 215.0 lb

## 2018-03-28 DIAGNOSIS — F411 Generalized anxiety disorder: Secondary | ICD-10-CM | POA: Diagnosis not present

## 2018-03-28 DIAGNOSIS — L089 Local infection of the skin and subcutaneous tissue, unspecified: Secondary | ICD-10-CM

## 2018-03-28 DIAGNOSIS — Z6839 Body mass index (BMI) 39.0-39.9, adult: Secondary | ICD-10-CM | POA: Diagnosis not present

## 2018-03-28 MED ORDER — SULFAMETHOXAZOLE-TRIMETHOPRIM 400-80 MG PO TABS: 1.0000 | ORAL_TABLET | Freq: Two times a day (BID) | ORAL | 0 refills | Status: AC

## 2018-03-28 MED ORDER — ALPRAZOLAM 0.5 MG PO TABS: ORAL_TABLET | ORAL | 0 refills | Status: DC

## 2018-03-28 NOTE — Patient Instructions (Signed)

## 2018-03-28 NOTE — Progress Notes (Signed)
[facility] [place], [place] [address]  Internal MEDICINE  Office Visit Note  Patient Name: Theresa Mills  [id number]  [id number]  Date of Service: 03/28/2018  Chief Complaint  Patient presents with  . Mass    , been working in the yard and cleaning out boxes and then noticed on right side of back , noticed it within the last week, was itching and noticed discharge coming from the area , has got smaller since starting      HPI Pt is here for a sick visit.  She report recently she is working in the yard and cleaning out boxes and has noticed a new area that she thought was a insect on her right anterior shoulder near her scapula.  Over the week it has gotten larger and red and has a black center.  But over the last couple days she noticed it has gotten somewhat smaller.  She is concerned because she feels like it should have away completely by now if it were a bug bite.     Current Medication:  Outpatient Encounter Medications as of 03/28/2018  Medication Sig  . ALPRAZolam (XANAX) 0.5 MG tablet Take 1/2 tablet in the morning and 1 tablet at bedtime as needed.  . Calcium Carbonate-Vitamin D (CALCIUM HIGH POTENCY/VITAMIN D) 600-200 MG-UNIT TABS Take 1 tablet by mouth daily.   . carbidopa-levodopa (SINEMET IR) 25-250 MG tablet   . citalopram (CELEXA) 40 MG tablet Take 1 tablet (40 mg total) by mouth daily.  Marland Kitchen glucose blood (ONETOUCH VERIO) test strip TEST BLOOD SUGAR BID  . levothyroxine (SYNTHROID, LEVOTHROID) 175 MCG tablet Take 1 tablet (175 mcg total) by mouth daily.  . Melatonin 3 MG TABS Take by mouth.  . metFORMIN (GLUCOPHAGE) 500 MG tablet Take 500 mg by mouth 2 (two) times daily with a meal.   . mirtazapine (REMERON) 7.5 MG tablet Take 1 tablet (7.5 mg total) by mouth at bedtime.  . Multiple Vitamins-Minerals (MULTIVITAMIN WOMEN 50+ PO) Take 1 tablet by mouth daily.   Marland Kitchen omeprazole (PRILOSEC) 40 MG capsule TAKE 1 CAPSULE BY MOUTH ONCE DAILY  .  ONETOUCH DELICA LANCETS 48J MISC TEST BLOOD SUGAR BID  . rOPINIRole (REQUIP XL) 2 MG 24 hr tablet Take 2 mg by mouth at bedtime.   . sertraline (ZOLOFT) 50 MG tablet Take by mouth.  . simvastatin (ZOCOR) 40 MG tablet Take 1 tablet (40 mg total) by mouth at bedtime.  . traMADol (ULTRAM) 50 MG tablet Take 1 tablet (50 mg total) by mouth every 8 (eight) hours as needed for moderate pain.  . [DISCONTINUED] ciprofloxacin (CIPRO) 500 MG tablet Take 1 tablet (500 mg total) by mouth 2 (two) times daily. (Patient not taking: Reported on 03/28/2018)   No facility-administered encounter medications on file as of 03/28/2018.       Medical History: Past Medical History:  Diagnosis Date  . Anxiety   . Diabetes ([place])   . Difficult intravenous access   . GERD (gastroesophageal reflux disease)   . HBP (high blood pressure)   . Hyperlipidemia   . Osteoarthritis   . Parkinson disease ([place])   . Sleep apnea   . Thyroid disease      Vital Signs: BP 116/68   Pulse 67   Temp 98.4 F (36.9 C) (Oral)   Resp 16   Ht 5\' 4"  (1.626 m)   Wt 215 lb (97.5 kg)   SpO2 97%   BMI 36.90 kg/m  Review of Systems  Constitutional: Negative for chills, fatigue and unexpected weight change.  HENT: Negative for congestion, rhinorrhea, sneezing and sore throat.   Eyes: Negative for photophobia, pain and redness.  Respiratory: Negative for cough, chest tightness and shortness of breath.   Cardiovascular: Negative for chest pain and palpitations.  Gastrointestinal: Negative for abdominal pain, constipation, diarrhea, nausea and vomiting.  Endocrine: Negative.   Genitourinary: Negative for dysuria and frequency.  Musculoskeletal: Negative for arthralgias, back pain, joint swelling and neck pain.  Skin: Negative for rash.  Allergic/Immunologic: Negative.   Neurological: Negative for tremors and numbness.  Hematological: Negative for adenopathy. Does not bruise/bleed easily.  Psychiatric/Behavioral: Negative for  behavioral problems and sleep disturbance. The patient is not nervous/anxious.     Physical Exam Vitals signs and nursing note reviewed.  Constitutional:      General: She is not in acute distress.    Appearance: She is well-developed. She is not diaphoretic.  HENT:     Head: Normocephalic and atraumatic.     Mouth/Throat:     Pharynx: No oropharyngeal exudate.  Eyes:     Pupils: Pupils are equal, round, and reactive to light.  Neck:     Musculoskeletal: Normal range of motion and neck supple.     Thyroid: No thyromegaly.     Vascular: No JVD.     Trachea: No tracheal deviation.  Cardiovascular:     Rate and Rhythm: Normal rate and regular rhythm.     Heart sounds: Normal heart sounds. No murmur. No friction rub. No gallop.   Pulmonary:     Effort: Pulmonary effort is normal. No respiratory distress.     Breath sounds: Normal breath sounds. No wheezing or rales.  Chest:     Chest wall: No tenderness.  Abdominal:     Palpations: Abdomen is soft.     Tenderness: There is no abdominal tenderness. There is no guarding.  Musculoskeletal: Normal range of motion.  Lymphadenopathy:     Cervical: No cervical adenopathy.  Skin:    General: Skin is warm and dry.     Comments: approx 3x3cm boil to right scapular area.  Black in the center, does not appear open. Pt states it has gotten smaller. Some erythema surrounding.   Neurological:     Mental Status: She is alert and oriented to person, place, and time.     Cranial Nerves: No cranial nerve deficit.  Psychiatric:        Behavior: Behavior normal.        Thought Content: Thought content normal.        Judgment: Judgment normal.    Assessment/Plan: 1. Skin infection Patient provided 10 days of Bactrim for an infection.  Instructed patient to continue to use warm compresses and look for signs and symptoms of infection or cellulitis.  Also instructed to take all the medication as prescribed and return to clinic if symptoms fail to  improve. - sulfamethoxazole-trimethoprim (BACTRIM,SEPTRA) 400-80 MG tablet; Take 1 tablet by mouth 2 (two) times daily for 10 days.  Dispense: 20 tablet; Refill: 0  2. Anxiety, generalized Refilled patient's Xanax prescription at this time. Reviewed risks and possible side effects associated with taking opiates, benzodiazepines and other CNS depressants. Combination of these could cause dizziness and drowsiness. Advised patient not to drive or operate machinery when taking these medications, as patient's and other's life can be at risk and will have consequences. Patient verbalized understanding in this matter. Dependence and abuse for these drugs will  be monitored closely. A Controlled substance policy and procedure is on file which allows [facility] to order a urine drug screen test at any visit. Patient understands and agrees with the plan - ALPRAZolam (XANAX) 0.5 MG tablet; Take 1/2 tablet in the morning and 1 tablet at bedtime as needed.  Dispense: 45 tablet; Refill: 0  3. Morbid obesity (HCC) Obesity Counseling: Risk Assessment: An assessment of behavioral risk factors was made today and includes lack of exercise sedentary lifestyle, lack of portion control and poor dietary habits.  Risk Modification Advice: She was counseled on portion control guidelines. Restricting daily caloric intake to. . The detrimental long term effects of obesity on her health and ongoing poor compliance was also discussed with the patient.   General Counseling: dennie vecchio understanding of the findings of todays visit and agrees with plan of treatment. I have discussed any further diagnostic evaluation that may be needed or ordered today. We also reviewed her medications today. she has been encouraged to call the office with any questions or concerns that should arise related to todays visit.   No orders of the defined types were placed in this encounter.   No orders of the defined types were  placed in this encounter.   Time spent: 25 Minutes  This patient was seen by Orson Gear AGNP-C in Collaboration with Dr Lavera Guise as a part of collaborative care agreement.  Kendell Bane AGNP-C Internal Medicine

## 2018-04-11 ENCOUNTER — Other Ambulatory Visit: Payer: Self-pay

## 2018-04-11 MED ORDER — SIMVASTATIN 40 MG PO TABS: 40.0000 mg | ORAL_TABLET | Freq: Every day | ORAL | 1 refills | Status: DC

## 2018-04-19 ENCOUNTER — Other Ambulatory Visit: Payer: Self-pay | Admitting: Nurse Practitioner

## 2018-04-19 MED ORDER — CELECOXIB 200 MG PO CAPS: 200.0000 mg | ORAL_CAPSULE | Freq: Two times a day (BID) | ORAL | 5 refills | Status: DC

## 2018-05-06 ENCOUNTER — Ambulatory Visit: Payer: Medicare Other | Admitting: Podiatry

## 2018-05-18 ENCOUNTER — Other Ambulatory Visit: Payer: Self-pay | Admitting: Adult Health

## 2018-05-18 DIAGNOSIS — F411 Generalized anxiety disorder: Secondary | ICD-10-CM

## 2018-05-23 ENCOUNTER — Ambulatory Visit: Payer: Self-pay | Admitting: Nurse Practitioner

## 2018-06-19 ENCOUNTER — Other Ambulatory Visit: Payer: Self-pay | Admitting: Adult Health

## 2018-06-19 DIAGNOSIS — F411 Generalized anxiety disorder: Secondary | ICD-10-CM

## 2018-06-20 ENCOUNTER — Ambulatory Visit: Payer: Medicare Other | Admitting: Nurse Practitioner

## 2018-06-20 NOTE — Telephone Encounter (Signed)
Last3/5/20 and next 07/09/18

## 2018-06-27 ENCOUNTER — Ambulatory Visit: Payer: Medicare Other | Admitting: Podiatry

## 2018-07-08 ENCOUNTER — Ambulatory Visit: Payer: Medicare Other | Admitting: Podiatry

## 2018-07-09 ENCOUNTER — Ambulatory Visit: Payer: Medicare Other | Admitting: Nurse Practitioner

## 2018-07-18 ENCOUNTER — Ambulatory Visit: Payer: Medicare Other | Admitting: Podiatry

## 2018-07-25 ENCOUNTER — Other Ambulatory Visit: Payer: Self-pay | Admitting: Nurse Practitioner

## 2018-07-30 ENCOUNTER — Other Ambulatory Visit: Payer: Self-pay

## 2018-07-31 ENCOUNTER — Other Ambulatory Visit: Payer: Self-pay

## 2018-07-31 MED ORDER — ONDANSETRON HCL 4 MG PO TABS: 4.0000 mg | ORAL_TABLET | Freq: Three times a day (TID) | ORAL | 0 refills | Status: DC | PRN

## 2018-08-06 ENCOUNTER — Other Ambulatory Visit: Payer: Self-pay | Admitting: Nurse Practitioner

## 2018-08-06 DIAGNOSIS — Z1231 Encounter for screening mammogram for malignant neoplasm of breast: Secondary | ICD-10-CM

## 2018-08-12 ENCOUNTER — Other Ambulatory Visit: Payer: Self-pay

## 2018-08-12 ENCOUNTER — Ambulatory Visit (INDEPENDENT_AMBULATORY_CARE_PROVIDER_SITE_OTHER): Payer: Medicare Other | Admitting: Podiatry

## 2018-08-12 ENCOUNTER — Encounter: Payer: Self-pay | Admitting: Podiatry

## 2018-08-12 VITALS — Temp 96.8°F

## 2018-08-12 DIAGNOSIS — M79676 Pain in unspecified toe(s): Secondary | ICD-10-CM

## 2018-08-12 DIAGNOSIS — B351 Tinea unguium: Secondary | ICD-10-CM

## 2018-08-12 DIAGNOSIS — E1142 Type 2 diabetes mellitus with diabetic polyneuropathy: Secondary | ICD-10-CM | POA: Diagnosis not present

## 2018-08-12 DIAGNOSIS — L84 Corns and callosities: Secondary | ICD-10-CM

## 2018-08-12 NOTE — Progress Notes (Signed)
Complaint:  Visit Type: Patient returns to my office for continued preventative foot care services. Complaint: Patient states" my nails have grown long and thick and become painful to walk and wear shoes" Patient has been diagnosed with DM with no foot complications. The patient presents for preventative foot care services. No changes to ROS.    Podiatric Exam: Vascular: dorsalis pedis and posterior tibial pulses are palpable bilateral. Capillary return is immediate. Temperature gradient is WNL. Skin turgor WNL  Sensorium: Normal Semmes Weinstein monofilament test. Normal tactile sensation bilaterally. Nail Exam: Pt has thick disfigured discolored nails with subungual debris noted bilateral entire nail hallux through fifth toenails except right hallux. Ulcer Exam: There is no evidence of ulcer or pre-ulcerative changes or infection. Orthopedic Exam: Muscle tone and strength are WNL. No limitations in general ROM. No crepitus or effusions noted. HAV  B/L with hammer toes 2-4  B/L. Skin: No Porokeratosis. No infection or ulcers.  Clavi 2, right..  Diagnosis:  Onychomycosis, , Pain in right toe, pain in left toes  Debride clavi.  Treatment & Plan Procedures and Treatment: Consent by patient was obtained for treatment procedures.   Debridement of mycotic and hypertrophic toenails, 1 through 5 bilateral and clearing of subungual debris. No ulceration, . Return Visit-Office Procedure: Patient instructed to return to the office for a follow up visit 3 months for continued evaluation and treatment.    Gardiner Barefoot DPM

## 2018-08-13 DIAGNOSIS — G3183 Dementia with Lewy bodies: Secondary | ICD-10-CM | POA: Diagnosis not present

## 2018-08-13 DIAGNOSIS — E785 Hyperlipidemia, unspecified: Secondary | ICD-10-CM | POA: Diagnosis not present

## 2018-08-13 DIAGNOSIS — M159 Polyosteoarthritis, unspecified: Secondary | ICD-10-CM | POA: Diagnosis not present

## 2018-08-13 DIAGNOSIS — E1165 Type 2 diabetes mellitus with hyperglycemia: Secondary | ICD-10-CM | POA: Diagnosis not present

## 2018-08-13 DIAGNOSIS — E039 Hypothyroidism, unspecified: Secondary | ICD-10-CM | POA: Diagnosis not present

## 2018-08-13 DIAGNOSIS — E063 Autoimmune thyroiditis: Secondary | ICD-10-CM | POA: Diagnosis not present

## 2018-08-13 DIAGNOSIS — E559 Vitamin D deficiency, unspecified: Secondary | ICD-10-CM | POA: Diagnosis not present

## 2018-08-13 DIAGNOSIS — C73 Malignant neoplasm of thyroid gland: Secondary | ICD-10-CM | POA: Diagnosis not present

## 2018-08-13 DIAGNOSIS — E668 Other obesity: Secondary | ICD-10-CM | POA: Diagnosis not present

## 2018-08-20 DIAGNOSIS — M159 Polyosteoarthritis, unspecified: Secondary | ICD-10-CM | POA: Diagnosis not present

## 2018-08-20 DIAGNOSIS — C73 Malignant neoplasm of thyroid gland: Secondary | ICD-10-CM | POA: Diagnosis not present

## 2018-08-20 DIAGNOSIS — Z6833 Body mass index (BMI) 33.0-33.9, adult: Secondary | ICD-10-CM | POA: Diagnosis not present

## 2018-08-20 DIAGNOSIS — E039 Hypothyroidism, unspecified: Secondary | ICD-10-CM | POA: Diagnosis not present

## 2018-08-20 DIAGNOSIS — E063 Autoimmune thyroiditis: Secondary | ICD-10-CM | POA: Diagnosis not present

## 2018-08-20 DIAGNOSIS — E1165 Type 2 diabetes mellitus with hyperglycemia: Secondary | ICD-10-CM | POA: Diagnosis not present

## 2018-08-20 DIAGNOSIS — E785 Hyperlipidemia, unspecified: Secondary | ICD-10-CM | POA: Diagnosis not present

## 2018-08-20 DIAGNOSIS — G3183 Dementia with Lewy bodies: Secondary | ICD-10-CM | POA: Diagnosis not present

## 2018-08-20 DIAGNOSIS — E668 Other obesity: Secondary | ICD-10-CM | POA: Diagnosis not present

## 2018-09-11 ENCOUNTER — Other Ambulatory Visit: Payer: Self-pay

## 2018-09-11 MED ORDER — OMEPRAZOLE 40 MG PO CPDR: 40.0000 mg | DELAYED_RELEASE_CAPSULE | Freq: Every day | ORAL | 3 refills | Status: DC

## 2018-09-12 ENCOUNTER — Other Ambulatory Visit: Payer: Self-pay

## 2018-09-12 ENCOUNTER — Encounter: Payer: Self-pay | Admitting: Nurse Practitioner

## 2018-09-12 ENCOUNTER — Ambulatory Visit (INDEPENDENT_AMBULATORY_CARE_PROVIDER_SITE_OTHER): Payer: Medicare Other | Admitting: Nurse Practitioner

## 2018-09-12 VITALS — BP 98/50 | HR 57 | Resp 16 | Ht 64.0 in | Wt 199.0 lb

## 2018-09-12 DIAGNOSIS — I10 Essential (primary) hypertension: Secondary | ICD-10-CM

## 2018-09-12 DIAGNOSIS — G20 Parkinson's disease: Secondary | ICD-10-CM

## 2018-09-12 DIAGNOSIS — E1165 Type 2 diabetes mellitus with hyperglycemia: Secondary | ICD-10-CM

## 2018-09-12 DIAGNOSIS — Z0001 Encounter for general adult medical examination with abnormal findings: Secondary | ICD-10-CM | POA: Diagnosis not present

## 2018-09-12 LAB — POCT GLYCOSYLATED HEMOGLOBIN (HGB A1C): Hemoglobin A1C: 5.5 % (ref 4.0–5.6)

## 2018-09-12 NOTE — Progress Notes (Signed)
[facility] [place], [place] [address]  Internal MEDICINE  Office Visit Note  Patient Name: Theresa Mills  [id number]  [id number]  Date of Service: 09/15/2018   Pt is here for routine health maintenance examination   Chief Complaint  Patient presents with  . Annual Exam  . Diabetes  . Gastroesophageal Reflux  . Hypertension  . Hyperlipidemia  . Quality Metric Gaps    hgba1c, mammogram, pna vacc     The patient is here for health maintenance exam. She states that she recently lost a cousin to suicide. She states that her cousin had been very ill. She shot herself after telling the patient she did not believe in suicide and would never use a gun. The patient and her cousin were very close and spent a great deal of time together.  Were able to talk about many things and understood each other very well.  The patient is diabetic.sugars are well controlled. Her HgbA1c is 5.8 today. She is scheduled for a mammogram in September 2020. She has had a full panel of blood work done per Dr. Arbutus Ped. He manages her diabetes and her thyroid disorder. She has seen podiatry in 07/2018 for diabetic foot exam and routine nail care.  The patient's blood pressure is on low side of normal. She Is trying to drink enough fluid. She is on several medications which make her nauseated and make foods taste poor.    Current Medication: Outpatient Encounter Medications as of 09/12/2018  Medication Sig  . ALPRAZolam (XANAX) 0.5 MG tablet TAKE 1/2 TABLET BY MOUTH IN THE MORNING AND 1 TABLET AT BEDTIME AS NEEDED  . Calcium Carbonate-Vitamin D (CALCIUM HIGH POTENCY/VITAMIN D) 600-200 MG-UNIT TABS Take 1 tablet by mouth daily.   . carbidopa-levodopa (SINEMET CR) 50-200 MG tablet Take 1 tablet by mouth 2 (two) times daily.   . celecoxib (CELEBREX) 200 MG capsule Take 200 mg by mouth 2 (two) times daily as needed.  . citalopram (CELEXA) 40 MG tablet Take 40 mg by mouth daily.  Marland Kitchen glucose  blood (ONETOUCH VERIO) test strip TEST BLOOD SUGAR BID  . levothyroxine (SYNTHROID, LEVOTHROID) 175 MCG tablet Take 1 tablet (175 mcg total) by mouth daily.  . Melatonin 3 MG TABS Take by mouth.  . metFORMIN (GLUCOPHAGE) 500 MG tablet Take 500 mg by mouth 2 (two) times daily with a meal.   . mirtazapine (REMERON) 7.5 MG tablet Take 7.5 mg by mouth at bedtime.  . Multiple Vitamins-Minerals (MULTIVITAMIN WOMEN 50+ PO) Take 1 tablet by mouth daily.   Marland Kitchen omeprazole (PRILOSEC) 40 MG capsule Take 1 capsule (40 mg total) by mouth daily.  . ondansetron (ZOFRAN) 4 MG tablet Take 1 tablet (4 mg total) by mouth every 8 (eight) hours as needed for nausea or vomiting.  Glory Rosebush DELICA LANCETS 92J MISC TEST BLOOD SUGAR BID  . rOPINIRole (REQUIP XL) 2 MG 24 hr tablet Take 2 mg by mouth at bedtime.   . sertraline (ZOLOFT) 50 MG tablet Take by mouth.  . simvastatin (ZOCOR) 40 MG tablet Take 1 tablet (40 mg total) by mouth at bedtime.  . [DISCONTINUED] celecoxib (CELEBREX) 200 MG capsule Take 1 capsule (200 mg total) by mouth 2 (two) times daily. (Patient not taking: Reported on 09/12/2018)  . [DISCONTINUED] citalopram (CELEXA) 40 MG tablet Take 1 tablet (40 mg total) by mouth daily. (Patient not taking: Reported on 09/12/2018)  . [DISCONTINUED] mirtazapine (REMERON) 7.5 MG tablet Take 1 tablet (7.5 mg total)  by mouth at bedtime. (Patient not taking: Reported on 09/12/2018)   No facility-administered encounter medications on file as of 09/12/2018.     Surgical History: Past Surgical History:  Procedure Laterality Date  . ABDOMINAL HYSTERECTOMY    . CATARACT EXTRACTION Bilateral   . CHOLECYSTECTOMY    . COLONOSCOPY WITH PROPOFOL N/A 04/02/2015   Procedure: COLONOSCOPY WITH PROPOFOL;  Surgeon: Josefine Class, MD;  Location: [facility] ENDOSCOPY;  Service: Endoscopy;  Laterality: N/A;  . ESOPHAGOGASTRODUODENOSCOPY (EGD) WITH PROPOFOL N/A 04/02/2015   Procedure: ESOPHAGOGASTRODUODENOSCOPY (EGD) WITH PROPOFOL;   Surgeon: Josefine Class, MD;  Location: [facility] ENDOSCOPY;  Service: Endoscopy;  Laterality: N/A;  . GALLBLADDER SURGERY    . LAPAROSCOPIC HYSTERECTOMY    . THYROIDECTOMY Left   . TONSILLECTOMY    . TONSILLECTOMY Bilateral     Medical History: Past Medical History:  Diagnosis Date  . Anxiety   . Diabetes ([place])   . Difficult intravenous access   . GERD (gastroesophageal reflux disease)   . HBP (high blood pressure)   . Hyperlipidemia   . Osteoarthritis   . Parkinson disease ([place])   . Sleep apnea   . Thyroid disease     Family History: Family History  Problem Relation Age of Onset  . Asthma Father   . Cancer Sister   . Stroke Maternal Uncle   . Heart disease Maternal Uncle   . Diabetes Maternal Uncle       Review of Systems  Constitutional: Positive for fatigue. Negative for appetite change and unexpected weight change.       Improved appetite  HENT: Negative for congestion, postnasal drip, rhinorrhea, sinus pressure, sinus pain and sore throat.   Respiratory: Positive for shortness of breath.   Cardiovascular: Negative for chest pain and palpitations.       Blood pressure a little low this morning.   Gastrointestinal: Positive for nausea. Negative for abdominal pain, constipation, diarrhea and vomiting.  Endocrine:       Sees endocrinology for thyroid disorder. Labs indicated hyperthyroid. Dose of levothyroxine recently decreased. Taken off invokana. Doing well. Improved energy levels.   Genitourinary: Negative for dysuria, frequency and urgency.  Musculoskeletal: Positive for back pain and myalgias.  Skin: Negative for rash.  Allergic/Immunologic: Negative for environmental allergies.  Neurological: Positive for weakness.  Hematological: Negative for adenopathy.  Psychiatric/Behavioral: Positive for dysphoric mood.    Today's Vitals   09/12/18 1103  BP: (!) 98/50  Pulse: (!) 57  Resp: 16  SpO2: 96%  Weight: 199 lb (90.3 kg)  Height: 5\' 4"  (1.626 m)    Body mass index is 34.16 kg/m.  Physical Exam Vitals signs and nursing note reviewed.  Constitutional:      General: She is not in acute distress.    Appearance: Normal appearance. She is well-developed. She is not diaphoretic.  HENT:     Head: Normocephalic and atraumatic.     Mouth/Throat:     Pharynx: No oropharyngeal exudate.  Eyes:     Pupils: Pupils are equal, round, and reactive to light.  Neck:     Musculoskeletal: Normal range of motion and neck supple.     Thyroid: No thyromegaly.     Vascular: No carotid bruit or JVD.     Trachea: No tracheal deviation.  Cardiovascular:     Rate and Rhythm: Normal rate. Rhythm irregular.     Pulses: Normal pulses.     Heart sounds: Normal heart sounds. No murmur. No friction rub. No gallop.  Pulmonary:     Effort: Pulmonary effort is normal. No respiratory distress.     Breath sounds: Normal breath sounds. No wheezing or rales.  Chest:     Chest wall: No tenderness.  Abdominal:     General: Bowel sounds are normal.     Palpations: Abdomen is soft.     Tenderness: There is no abdominal tenderness.  Musculoskeletal: Normal range of motion.     Comments: Generalized weakness noted. Uses a walker to help with ambulation.   Lymphadenopathy:     Cervical: No cervical adenopathy.  Skin:    General: Skin is warm and dry.  Neurological:     Mental Status: She is alert and oriented to person, place, and time. Mental status is at baseline.     Cranial Nerves: No cranial nerve deficit.  Psychiatric:        Behavior: Behavior normal.        Thought Content: Thought content normal.        Judgment: Judgment normal.    Depression screen [facility] 2/9 09/12/2018 03/28/2018 09/05/2017 09/05/2017 04/19/2017  Decreased Interest 1 0 0 0 0  Down, Depressed, Hopeless 1 0 0 0 0  PHQ - 2 Score 2 0 0 0 0  Altered sleeping 0 - - - -  Tired, decreased energy 1 - - - -  Change in appetite 2 - - - -  Feeling bad or failure about yourself  1 - - - -   Trouble concentrating 3 - - - -  Moving slowly or fidgety/restless 1 - - - -  Suicidal thoughts 0 - - - -  PHQ-9 Score 10 - - - -  Difficult doing work/chores Somewhat difficult - - - -    Functional Status Survey: Is the patient deaf or have difficulty hearing?: No Does the patient have difficulty seeing, even when wearing glasses/contacts?: No Does the patient have difficulty concentrating, remembering, or making decisions?: Yes(some days) Does the patient have difficulty walking or climbing stairs?: Yes(uses a walker) Does the patient have difficulty dressing or bathing?: Yes(due to arthritis and parkinson's) Does the patient have difficulty doing errands alone such as visiting a doctor's office or shopping?: No  MMSE - [place] Exam 09/12/2018 09/05/2017  Orientation to time 5 5  Orientation to Place 5 5  Registration 3 3  Attention/ Calculation 5 5  Recall 3 3  Language- name 2 objects 2 2  Language- repeat 1 1  Language- follow 3 step command 3 3  Language- read & follow direction 1 1  Write a sentence 1 1  Copy design 1 1  Total score 30 30    Fall Risk  09/12/2018 03/28/2018 01/21/2018 09/05/2017 09/05/2017  Falls in the past year? 0 0 0 Yes Yes  Comment - - - - -  Number falls in past yr: - - - 2 or more 2 or more  Comment - - - - -  Injury with Fall? - - - Yes Yes  Risk for fall due to : - - - - -      LABS: Recent Results (from the past 2160 hour(s))  POCT HgB A1C     Status: None   Collection Time: 09/12/18 11:55 AM  Result Value Ref Range   Hemoglobin A1C 5.5 4.0 - 5.6 %   HbA1c POC (<> result, manual entry)     HbA1c, POC (prediabetic range)     HbA1c, POC (controlled diabetic range)  Assessment/Plan: 1. Encounter for general adult medical examination with abnormal findings Annual wellness visit today.   2. Type 2 diabetes mellitus with hyperglycemia, unspecified whether long term insulin use (HCC) - POCT HgB A1C 5.5 today. Continue  diabetic medication as prescribed. Continue regular visits with endocrinology as scheduled.   3. Essential hypertension Stable. Continue bp medication as prescribed   4. Parkinson disease ([place]) Continue regular visits with neurology as scheduled.   General Counseling: zaidee rion understanding of the findings of todays visit and agrees with plan of treatment. I have discussed any further diagnostic evaluation that may be needed or ordered today. We also reviewed her medications today. she has been encouraged to call the office with any questions or concerns that should arise related to todays visit.    Counseling:  This patient was seen by Leretha Pol FNP Collaboration with Dr Lavera Guise as a part of collaborative care agreement  Orders Placed This Encounter  Procedures  . POCT HgB A1C     Time spent: [place], MD  Internal Medicine

## 2018-09-15 DIAGNOSIS — E1165 Type 2 diabetes mellitus with hyperglycemia: Secondary | ICD-10-CM | POA: Insufficient documentation

## 2018-09-25 ENCOUNTER — Other Ambulatory Visit: Payer: Self-pay | Admitting: Internal Medicine

## 2018-09-25 DIAGNOSIS — F411 Generalized anxiety disorder: Secondary | ICD-10-CM

## 2018-09-25 NOTE — Telephone Encounter (Signed)
Pt last 09/12/18

## 2018-09-26 ENCOUNTER — Ambulatory Visit
Admission: RE | Admit: 2018-09-26 | Discharge: 2018-09-26 | Disposition: A | Discharge status: Home / Self Care | Payer: Medicare Other | Source: Ambulatory Visit | Attending: Nurse Practitioner | Admitting: Nurse Practitioner

## 2018-09-26 DIAGNOSIS — Z1231 Encounter for screening mammogram for malignant neoplasm of breast: Secondary | ICD-10-CM | POA: Diagnosis not present

## 2018-09-26 NOTE — Telephone Encounter (Signed)
Just did this today.

## 2018-10-02 NOTE — Progress Notes (Signed)
Negative mammogram

## 2018-10-14 DIAGNOSIS — G20 Parkinson's disease: Secondary | ICD-10-CM | POA: Diagnosis not present

## 2018-10-21 ENCOUNTER — Ambulatory Visit: Payer: Medicare Other | Admitting: Podiatry

## 2018-10-21 ENCOUNTER — Other Ambulatory Visit: Payer: Self-pay | Admitting: Internal Medicine

## 2018-10-23 ENCOUNTER — Telehealth: Payer: Self-pay

## 2018-10-23 NOTE — Telephone Encounter (Signed)
Patient paperwork ready to pick up Theresa Mills

## 2018-11-04 ENCOUNTER — Other Ambulatory Visit: Payer: Self-pay

## 2018-11-04 ENCOUNTER — Encounter: Payer: Self-pay | Admitting: Podiatry

## 2018-11-04 ENCOUNTER — Ambulatory Visit (INDEPENDENT_AMBULATORY_CARE_PROVIDER_SITE_OTHER): Payer: Medicare Other | Admitting: Podiatry

## 2018-11-04 DIAGNOSIS — M79676 Pain in unspecified toe(s): Secondary | ICD-10-CM | POA: Diagnosis not present

## 2018-11-04 DIAGNOSIS — B351 Tinea unguium: Secondary | ICD-10-CM

## 2018-11-04 DIAGNOSIS — L84 Corns and callosities: Secondary | ICD-10-CM

## 2018-11-04 DIAGNOSIS — E1142 Type 2 diabetes mellitus with diabetic polyneuropathy: Secondary | ICD-10-CM

## 2018-11-04 NOTE — Progress Notes (Signed)
Complaint:  Visit Type: Patient returns to my office for continued preventative foot care services. Complaint: Patient states" my nails have grown long and thick and become painful to walk and wear shoes" Patient has been diagnosed with DM with no foot complications. The patient presents for preventative foot care services. No changes to ROS.    Podiatric Exam: Vascular: dorsalis pedis and posterior tibial pulses are palpable bilateral. Capillary return is immediate. Temperature gradient is WNL. Skin turgor WNL  Sensorium: Normal Semmes Weinstein monofilament test. Normal tactile sensation bilaterally. Nail Exam: Pt has thick disfigured discolored nails with subungual debris noted bilateral entire nail hallux through fifth toenails except right hallux. Ulcer Exam: There is no evidence of ulcer or pre-ulcerative changes or infection. Orthopedic Exam: Muscle tone and strength are WNL. No limitations in general ROM. No crepitus or effusions noted. HAV  B/L with hammer toes 2-4  B/L. Skin: No Porokeratosis. No infection or ulcers.  Clavi 2, right..  Diagnosis:  Onychomycosis, , Pain in right toe, pain in left toes  Debride clavi.  Treatment & Plan Procedures and Treatment: Consent by patient was obtained for treatment procedures.   Debridement of mycotic and hypertrophic toenails, 1 through 5 bilateral and clearing of subungual debris. No ulceration, . Return Visit-Office Procedure: Patient instructed to return to the office for a follow up visit 10 weeks  for continued evaluation and treatment.    Gardiner Barefoot DPM

## 2018-11-06 DIAGNOSIS — Z23 Encounter for immunization: Secondary | ICD-10-CM | POA: Diagnosis not present

## 2018-12-26 ENCOUNTER — Other Ambulatory Visit: Payer: Self-pay

## 2018-12-26 DIAGNOSIS — F411 Generalized anxiety disorder: Secondary | ICD-10-CM

## 2018-12-26 MED ORDER — ALPRAZOLAM 0.5 MG PO TABS: ORAL_TABLET | ORAL | 1 refills | Status: DC

## 2018-12-26 MED ORDER — SIMVASTATIN 40 MG PO TABS: 40.0000 mg | ORAL_TABLET | Freq: Every day | ORAL | 1 refills | Status: DC

## 2019-01-13 ENCOUNTER — Ambulatory Visit: Payer: Medicare Other | Admitting: Podiatry

## 2019-01-14 ENCOUNTER — Ambulatory Visit: Payer: Medicare Other | Admitting: Nurse Practitioner

## 2019-01-23 ENCOUNTER — Other Ambulatory Visit: Payer: Self-pay

## 2019-01-23 ENCOUNTER — Encounter: Payer: Self-pay | Admitting: Podiatry

## 2019-01-23 ENCOUNTER — Ambulatory Visit (INDEPENDENT_AMBULATORY_CARE_PROVIDER_SITE_OTHER): Payer: Medicare Other | Admitting: Podiatry

## 2019-01-23 DIAGNOSIS — L84 Corns and callosities: Secondary | ICD-10-CM

## 2019-01-23 DIAGNOSIS — M79676 Pain in unspecified toe(s): Secondary | ICD-10-CM | POA: Diagnosis not present

## 2019-01-23 DIAGNOSIS — E1142 Type 2 diabetes mellitus with diabetic polyneuropathy: Secondary | ICD-10-CM

## 2019-01-23 DIAGNOSIS — M2041 Other hammer toe(s) (acquired), right foot: Secondary | ICD-10-CM

## 2019-01-23 DIAGNOSIS — M2042 Other hammer toe(s) (acquired), left foot: Secondary | ICD-10-CM | POA: Diagnosis not present

## 2019-01-23 DIAGNOSIS — B351 Tinea unguium: Secondary | ICD-10-CM

## 2019-01-23 NOTE — Progress Notes (Signed)
Complaint:  Visit Type: Patient returns to my office for continued preventative foot care services. Complaint: Patient states" my nails have grown long and thick and become painful to walk and wear shoes" Patient has been diagnosed with DM with no foot complications. The patient presents for preventative foot care services. No changes to ROS.    Podiatric Exam: Vascular: dorsalis pedis and posterior tibial pulses are palpable bilateral. Capillary return is immediate. Temperature gradient is WNL. Skin turgor WNL  Sensorium: Normal Semmes Weinstein monofilament test. Normal tactile sensation bilaterally. Nail Exam: Pt has thick disfigured discolored nails with subungual debris noted bilateral entire nail hallux through fifth toenails except right hallux. Ulcer Exam: There is no evidence of ulcer or pre-ulcerative changes or infection. Orthopedic Exam: Muscle tone and strength are WNL. No limitations in general ROM. No crepitus or effusions noted. HAV  B/L with hammer toes 2-4  B/L. Skin: No Porokeratosis. No infection or ulcers.  Clavi 2, right..  Diagnosis:  Onychomycosis, , Pain in right toe, pain in left toes  Debride clavi.  Treatment & Plan Procedures and Treatment: Consent by patient was obtained for treatment procedures.   Debridement of mycotic and hypertrophic toenails, 1 through 5 bilateral and clearing of subungual debris. No ulceration, .Patient was in severe pain second toe right foot due to hammering of second toe and thickness of second toenail.  Recommended she seek a surgical consult and she asked to be seen  By Dr.  Milinda Pointer. Return Visit-Office Procedure: Patient instructed to return to the office for a follow up visit 10 weeks  for continued evaluation and treatment.    Gardiner Barefoot DPM

## 2019-02-04 ENCOUNTER — Telehealth: Payer: Self-pay

## 2019-02-04 NOTE — Telephone Encounter (Signed)
CONFIRMED 02-10-19 OV AS VIRTUAL.

## 2019-02-10 ENCOUNTER — Encounter: Payer: Self-pay | Admitting: Nurse Practitioner

## 2019-02-10 ENCOUNTER — Ambulatory Visit (INDEPENDENT_AMBULATORY_CARE_PROVIDER_SITE_OTHER): Payer: Medicare Other | Admitting: Nurse Practitioner

## 2019-02-10 VITALS — Ht 64.0 in

## 2019-02-10 DIAGNOSIS — E1165 Type 2 diabetes mellitus with hyperglycemia: Secondary | ICD-10-CM | POA: Diagnosis not present

## 2019-02-10 DIAGNOSIS — E039 Hypothyroidism, unspecified: Secondary | ICD-10-CM

## 2019-02-10 DIAGNOSIS — F329 Major depressive disorder, single episode, unspecified: Secondary | ICD-10-CM | POA: Diagnosis not present

## 2019-02-10 DIAGNOSIS — F32A Depression, unspecified: Secondary | ICD-10-CM

## 2019-02-10 DIAGNOSIS — R112 Nausea with vomiting, unspecified: Secondary | ICD-10-CM

## 2019-02-10 MED ORDER — LEVOTHYROXINE SODIUM 175 MCG PO TABS: 175.0000 ug | ORAL_TABLET | Freq: Every day | ORAL | 3 refills | Status: DC

## 2019-02-10 MED ORDER — SERTRALINE HCL 50 MG PO TABS: 50.0000 mg | ORAL_TABLET | Freq: Every day | ORAL | 1 refills | Status: DC

## 2019-02-10 MED ORDER — ONDANSETRON HCL 4 MG PO TABS: ORAL_TABLET | ORAL | 0 refills | Status: DC

## 2019-02-10 MED ORDER — MIRTAZAPINE 7.5 MG PO TABS: 7.5000 mg | ORAL_TABLET | Freq: Every day | ORAL | 3 refills | Status: DC

## 2019-02-10 NOTE — Progress Notes (Signed)
[facility] [place], [place] [address]  Internal MEDICINE  Telephone Visit  Patient Name: Theresa Mills  [id number]  [id number]  Date of Service: 02/13/2019  I connected with the patient at 11:24am by telephone and verified the patients identity using two identifiers.   I discussed the limitations, risks, security and privacy concerns of performing an evaluation and management service by telephone and the availability of in person appointments. I also discussed with the patient that there may be a patient responsible charge related to the service.  The patient expressed understanding and agrees to proceed.    Chief Complaint  Patient presents with  . Telephone Assessment  . Telephone Screen  . Diabetes    blood sugar 84 this morning   . Gastroesophageal Reflux  . Hypothyroidism    The patient has been contacted via telephone for follow up visit due to concerns for spread of novel coronavirus. The patient presents for routine follow up visit. She states that she has been a little more depressed, recently. She believes it is mostly due to isolation precautions because of COVID 19. She does feel as though depression causes her to be nauseated with reduced appetite. She will take prescription ondansetron as needed which does help her nausea. She states she is doing well. Blood sugars are stable. She does need to have refills for some of her routine medications.       Current Medication: Outpatient Encounter Medications as of 02/10/2019  Medication Sig  . ALPRAZolam (XANAX) 0.5 MG tablet TAKE 1/2 TABLET BY MOUTH EVERY MORNING AND TAKE ONE TABLET BY MOUTH AT BEDTIME AS NEEDED  . Calcium Carbonate-Vitamin D (CALCIUM HIGH POTENCY/VITAMIN D) 600-200 MG-UNIT TABS Take 1 tablet by mouth daily.   . carbidopa-levodopa (SINEMET CR) 50-200 MG tablet Take 1 tablet by mouth 2 (two) times daily.   . celecoxib (CELEBREX) 200 MG capsule Take 200 mg by mouth 2 (two) times daily  as needed.  . citalopram (CELEXA) 40 MG tablet Take 40 mg by mouth daily.  Marland Kitchen glucose blood (ONETOUCH VERIO) test strip TEST BLOOD SUGAR BID  . levothyroxine (SYNTHROID) 175 MCG tablet Take 1 tablet (175 mcg total) by mouth daily.  . Melatonin 3 MG TABS Take by mouth.  . metFORMIN (GLUCOPHAGE) 500 MG tablet Take 500 mg by mouth 2 (two) times daily with a meal.   . mirtazapine (REMERON) 7.5 MG tablet Take 1 tablet (7.5 mg total) by mouth at bedtime.  . Multiple Vitamins-Minerals (MULTIVITAMIN WOMEN 50+ PO) Take 1 tablet by mouth daily.   Marland Kitchen omeprazole (PRILOSEC) 40 MG capsule Take 1 capsule (40 mg total) by mouth daily.  . ondansetron (ZOFRAN) 4 MG tablet TAKE ONE TABLET BY MOUTH EVERY 8 HOURS AS NEEDED FOR NAUSEA OR VOMITING  . ONETOUCH DELICA LANCETS 99991111 MISC TEST BLOOD SUGAR BID  . rOPINIRole (REQUIP XL) 2 MG 24 hr tablet Take 2 mg by mouth at bedtime.   . sertraline (ZOLOFT) 50 MG tablet Take 1 tablet (50 mg total) by mouth daily.  . simvastatin (ZOCOR) 40 MG tablet Take 1 tablet (40 mg total) by mouth at bedtime.  . [DISCONTINUED] levothyroxine (SYNTHROID, LEVOTHROID) 175 MCG tablet Take 1 tablet (175 mcg total) by mouth daily.  . [DISCONTINUED] mirtazapine (REMERON) 7.5 MG tablet Take 7.5 mg by mouth at bedtime.  . [DISCONTINUED] ondansetron (ZOFRAN) 4 MG tablet TAKE ONE TABLET BY MOUTH EVERY 8 HOURS AS NEEDED FOR NAUSEA OR VOMITING  . [DISCONTINUED] sertraline (ZOLOFT) 50  MG tablet Take by mouth.   No facility-administered encounter medications on file as of 02/10/2019.    Surgical History: Past Surgical History:  Procedure Laterality Date  . ABDOMINAL HYSTERECTOMY    . CATARACT EXTRACTION Bilateral   . CHOLECYSTECTOMY    . COLONOSCOPY WITH PROPOFOL N/A 04/02/2015   Procedure: COLONOSCOPY WITH PROPOFOL;  Surgeon: Josefine Class, MD;  Location: [facility] ENDOSCOPY;  Service: Endoscopy;  Laterality: N/A;  . ESOPHAGOGASTRODUODENOSCOPY (EGD) WITH PROPOFOL N/A 04/02/2015   Procedure:  ESOPHAGOGASTRODUODENOSCOPY (EGD) WITH PROPOFOL;  Surgeon: Josefine Class, MD;  Location: [facility] ENDOSCOPY;  Service: Endoscopy;  Laterality: N/A;  . GALLBLADDER SURGERY    . LAPAROSCOPIC HYSTERECTOMY    . THYROIDECTOMY Left   . TONSILLECTOMY    . TONSILLECTOMY Bilateral     Medical History: Past Medical History:  Diagnosis Date  . Anxiety   . Diabetes ([place])   . Difficult intravenous access   . GERD (gastroesophageal reflux disease)   . HBP (high blood pressure)   . Hyperlipidemia   . Osteoarthritis   . Parkinson disease ([place])   . Sleep apnea   . Thyroid disease     Family History: Family History  Problem Relation Age of Onset  . Asthma Father   . Cancer Sister   . Stroke Maternal Uncle   . Heart disease Maternal Uncle   . Diabetes Maternal Uncle     Social History   Socioeconomic History  . Marital status: Widowed    Spouse name: Not on file  . Number of children: Not on file  . Years of education: Not on file  . Highest education level: Not on file  Occupational History  . Not on file  Tobacco Use  . Smoking status: Never Smoker  . Smokeless tobacco: Never Used  Substance and Sexual Activity  . Alcohol use: No  . Drug use: No  . Sexual activity: Not on file  Other Topics Concern  . Not on file  Social History Narrative  . Not on file   Social Determinants of Health   Financial Resource Strain:   . Difficulty of Paying Living Expenses: Not on file  Food Insecurity:   . Worried About Charity fundraiser in the Last Year: Not on file  . Ran Out of Food in the Last Year: Not on file  Transportation Needs:   . Lack of Transportation (Medical): Not on file  . Lack of Transportation (Non-Medical): Not on file  Physical Activity:   . Days of Exercise per Week: Not on file  . Minutes of Exercise per Session: Not on file  Stress:   . Feeling of Stress : Not on file  Social Connections:   . Frequency of Communication with Friends and Family: Not on file   . Frequency of Social Gatherings with Friends and Family: Not on file  . Attends Religious Services: Not on file  . Active Member of Clubs or Organizations: Not on file  . Attends Archivist Meetings: Not on file  . Marital Status: Not on file  Intimate Partner Violence:   . Fear of Current or Ex-Partner: Not on file  . Emotionally Abused: Not on file  . Physically Abused: Not on file  . Sexually Abused: Not on file      Review of Systems  Constitutional: Positive for fatigue. Negative for appetite change and unexpected weight change.       Appetite stable.   HENT: Negative for congestion, postnasal drip, rhinorrhea,  sinus pressure, sinus pain and sore throat.   Respiratory: Positive for shortness of breath.   Cardiovascular: Negative for chest pain and palpitations.  Gastrointestinal: Positive for nausea. Negative for abdominal pain, constipation, diarrhea and vomiting.  Endocrine: Negative for cold intolerance, heat intolerance, polydipsia and polyuria.       Blood sugars doing well   Musculoskeletal: Positive for back pain and myalgias.  Skin: Negative for rash.  Allergic/Immunologic: Negative for environmental allergies.  Neurological: Positive for weakness.  Hematological: Negative for adenopathy.  Psychiatric/Behavioral: Positive for dysphoric mood.    Today's Vitals   02/10/19 1029  Height: 5\' 4"  (1.626 m)   Body mass index is 34.16 kg/m.  Observation/Objective:   The patient is alert and oriented. She is pleasant and answers all questions appropriately. Breathing is non-labored. She is in no acute distress at this time.    Assessment/Plan: 1. Type 2 diabetes mellitus with hyperglycemia, without long-term current use of insulin (HCC) Blood sugars stable. Continue diabetic medication as prescribed   2. Nausea and vomiting, intractability of vomiting not specified, unspecified vomiting type May take zofran 4mg  as needed and as prescribed for nausea.  The 'BRAT' diet is suggested, then progress to diet as tolerated as symptoms abate. Call if bloody stools, persistent diarrhea, vomiting, fever or abdominal pain. - ondansetron (ZOFRAN) 4 MG tablet; TAKE ONE TABLET BY MOUTH EVERY 8 HOURS AS NEEDED FOR NAUSEA OR VOMITING  Dispense: 90 tablet; Refill: 0  3. Acquired hypothyroidism Continue levothyroxine as prescribed  - levothyroxine (SYNTHROID) 175 MCG tablet; Take 1 tablet (175 mcg total) by mouth daily.  Dispense: 90 tablet; Refill: 3  4. Depression, unspecified depression type Continue sertraline and mirtazapine as prescribed. Refills provided today.  - sertraline (ZOLOFT) 50 MG tablet; Take 1 tablet (50 mg total) by mouth daily.  Dispense: 90 tablet; Refill: 1 - mirtazapine (REMERON) 7.5 MG tablet; Take 1 tablet (7.5 mg total) by mouth at bedtime.  Dispense: 90 tablet; Refill: 3   General Counseling: Ryle verbalizes understanding of the findings of today's phone visit and agrees with plan of treatment. I have discussed any further diagnostic evaluation that may be needed or ordered today. We also reviewed her medications today. she has been encouraged to call the office with any questions or concerns that should arise related to todays visit.   Diabetes Counseling:  1. Addition of ACE inh/ ARB'S for nephroprotection. Microalbumin is updated  2. Diabetic foot care, prevention of complications. Podiatry consult 3. Exercise and lose weight.  4. Diabetic eye examination, Diabetic eye exam is updated  5. Monitor blood sugar closlely. nutrition counseling.  6. Sign and symptoms of hypoglycemia including shaking sweating,confusion and headaches.  This patient was seen by [place] with Dr Lavera Guise as a part of collaborative care agreement  Meds ordered this encounter  Medications  . levothyroxine (SYNTHROID) 175 MCG tablet    Sig: Take 1 tablet (175 mcg total) by mouth daily.    Dispense:  90 tablet    Refill:   3    Order Specific Question:   Supervising Provider    Answer:   Lavera Guise [id number]  . mirtazapine (REMERON) 7.5 MG tablet    Sig: Take 1 tablet (7.5 mg total) by mouth at bedtime.    Dispense:  90 tablet    Refill:  3    Order Specific Question:   Supervising Provider    Answer:   Lavera Guise [id number]  . ondansetron (  ZOFRAN) 4 MG tablet    Sig: TAKE ONE TABLET BY MOUTH EVERY 8 HOURS AS NEEDED FOR NAUSEA OR VOMITING    Dispense:  90 tablet    Refill:  0    Order Specific Question:   Supervising Provider    Answer:   Lavera Guise [id number]  . sertraline (ZOLOFT) 50 MG tablet    Sig: Take 1 tablet (50 mg total) by mouth daily.    Dispense:  90 tablet    Refill:  1    Order Specific Question:   Supervising Provider    Answer:   Lavera Guise [id number]    Time spent: 26 Minutes    Dr Lavera Guise Internal medicine

## 2019-02-13 DIAGNOSIS — E039 Hypothyroidism, unspecified: Secondary | ICD-10-CM | POA: Insufficient documentation

## 2019-02-13 DIAGNOSIS — R112 Nausea with vomiting, unspecified: Secondary | ICD-10-CM | POA: Insufficient documentation

## 2019-02-24 ENCOUNTER — Encounter: Payer: Medicare Other | Admitting: Podiatry

## 2019-02-24 ENCOUNTER — Ambulatory Visit: Payer: Medicare Other

## 2019-02-24 DIAGNOSIS — M2041 Other hammer toe(s) (acquired), right foot: Secondary | ICD-10-CM

## 2019-02-26 DIAGNOSIS — E559 Vitamin D deficiency, unspecified: Secondary | ICD-10-CM | POA: Diagnosis not present

## 2019-02-26 DIAGNOSIS — E063 Autoimmune thyroiditis: Secondary | ICD-10-CM | POA: Diagnosis not present

## 2019-02-26 DIAGNOSIS — M159 Polyosteoarthritis, unspecified: Secondary | ICD-10-CM | POA: Diagnosis not present

## 2019-02-26 DIAGNOSIS — E785 Hyperlipidemia, unspecified: Secondary | ICD-10-CM | POA: Diagnosis not present

## 2019-02-26 DIAGNOSIS — E668 Other obesity: Secondary | ICD-10-CM | POA: Diagnosis not present

## 2019-02-26 DIAGNOSIS — Z6831 Body mass index (BMI) 31.0-31.9, adult: Secondary | ICD-10-CM | POA: Diagnosis not present

## 2019-02-26 DIAGNOSIS — G3183 Dementia with Lewy bodies: Secondary | ICD-10-CM | POA: Diagnosis not present

## 2019-02-26 DIAGNOSIS — E039 Hypothyroidism, unspecified: Secondary | ICD-10-CM | POA: Diagnosis not present

## 2019-02-26 DIAGNOSIS — C73 Malignant neoplasm of thyroid gland: Secondary | ICD-10-CM | POA: Diagnosis not present

## 2019-02-26 DIAGNOSIS — E1165 Type 2 diabetes mellitus with hyperglycemia: Secondary | ICD-10-CM | POA: Diagnosis not present

## 2019-03-09 DIAGNOSIS — E668 Other obesity: Secondary | ICD-10-CM | POA: Diagnosis not present

## 2019-03-09 DIAGNOSIS — E785 Hyperlipidemia, unspecified: Secondary | ICD-10-CM | POA: Diagnosis not present

## 2019-03-09 DIAGNOSIS — C73 Malignant neoplasm of thyroid gland: Secondary | ICD-10-CM | POA: Diagnosis not present

## 2019-03-09 DIAGNOSIS — M159 Polyosteoarthritis, unspecified: Secondary | ICD-10-CM | POA: Diagnosis not present

## 2019-03-09 DIAGNOSIS — G3183 Dementia with Lewy bodies: Secondary | ICD-10-CM | POA: Diagnosis not present

## 2019-03-09 DIAGNOSIS — E039 Hypothyroidism, unspecified: Secondary | ICD-10-CM | POA: Diagnosis not present

## 2019-03-09 DIAGNOSIS — Z6831 Body mass index (BMI) 31.0-31.9, adult: Secondary | ICD-10-CM | POA: Diagnosis not present

## 2019-03-09 DIAGNOSIS — E063 Autoimmune thyroiditis: Secondary | ICD-10-CM | POA: Diagnosis not present

## 2019-03-09 DIAGNOSIS — E1165 Type 2 diabetes mellitus with hyperglycemia: Secondary | ICD-10-CM | POA: Diagnosis not present

## 2019-03-10 DIAGNOSIS — Z6831 Body mass index (BMI) 31.0-31.9, adult: Secondary | ICD-10-CM | POA: Diagnosis not present

## 2019-03-10 DIAGNOSIS — C73 Malignant neoplasm of thyroid gland: Secondary | ICD-10-CM | POA: Diagnosis not present

## 2019-03-10 DIAGNOSIS — E039 Hypothyroidism, unspecified: Secondary | ICD-10-CM | POA: Diagnosis not present

## 2019-03-10 DIAGNOSIS — E668 Other obesity: Secondary | ICD-10-CM | POA: Diagnosis not present

## 2019-03-10 DIAGNOSIS — E785 Hyperlipidemia, unspecified: Secondary | ICD-10-CM | POA: Diagnosis not present

## 2019-03-10 DIAGNOSIS — E1165 Type 2 diabetes mellitus with hyperglycemia: Secondary | ICD-10-CM | POA: Diagnosis not present

## 2019-03-10 DIAGNOSIS — G3183 Dementia with Lewy bodies: Secondary | ICD-10-CM | POA: Diagnosis not present

## 2019-03-10 DIAGNOSIS — M159 Polyosteoarthritis, unspecified: Secondary | ICD-10-CM | POA: Diagnosis not present

## 2019-03-10 DIAGNOSIS — E063 Autoimmune thyroiditis: Secondary | ICD-10-CM | POA: Diagnosis not present

## 2019-03-23 DIAGNOSIS — G3183 Dementia with Lewy bodies: Secondary | ICD-10-CM | POA: Diagnosis not present

## 2019-03-23 DIAGNOSIS — E039 Hypothyroidism, unspecified: Secondary | ICD-10-CM | POA: Diagnosis not present

## 2019-03-23 DIAGNOSIS — M159 Polyosteoarthritis, unspecified: Secondary | ICD-10-CM | POA: Diagnosis not present

## 2019-03-23 DIAGNOSIS — E1165 Type 2 diabetes mellitus with hyperglycemia: Secondary | ICD-10-CM | POA: Diagnosis not present

## 2019-03-24 DIAGNOSIS — Z23 Encounter for immunization: Secondary | ICD-10-CM | POA: Diagnosis not present

## 2019-03-25 NOTE — Progress Notes (Signed)
This encounter was created in error - please disregard.

## 2019-03-31 ENCOUNTER — Other Ambulatory Visit: Payer: Self-pay | Admitting: Nurse Practitioner

## 2019-03-31 ENCOUNTER — Other Ambulatory Visit: Payer: Self-pay | Admitting: Internal Medicine

## 2019-03-31 ENCOUNTER — Telehealth: Payer: Self-pay

## 2019-03-31 DIAGNOSIS — F411 Generalized anxiety disorder: Secondary | ICD-10-CM

## 2019-03-31 MED ORDER — ALPRAZOLAM 0.5 MG PO TABS: ORAL_TABLET | ORAL | 1 refills | Status: DC

## 2019-03-31 NOTE — Telephone Encounter (Signed)
Pt needs refill

## 2019-03-31 NOTE — Progress Notes (Signed)
Refilled current alprazolam prescription and gave one additional refill.

## 2019-03-31 NOTE — Telephone Encounter (Signed)
Refilled current alprazolam prescription and gave one additional refill.

## 2019-04-10 ENCOUNTER — Other Ambulatory Visit: Payer: Self-pay

## 2019-04-10 MED ORDER — SIMVASTATIN 40 MG PO TABS: 40.0000 mg | ORAL_TABLET | Freq: Every day | ORAL | 1 refills | Status: DC

## 2019-04-22 DIAGNOSIS — Z23 Encounter for immunization: Secondary | ICD-10-CM | POA: Diagnosis not present

## 2019-04-24 ENCOUNTER — Ambulatory Visit: Payer: Medicare Other | Admitting: Podiatry

## 2019-04-28 ENCOUNTER — Other Ambulatory Visit: Payer: Self-pay

## 2019-04-28 MED ORDER — CELECOXIB 200 MG PO CAPS: 200.0000 mg | ORAL_CAPSULE | Freq: Two times a day (BID) | ORAL | 1 refills | Status: DC | PRN

## 2019-05-01 ENCOUNTER — Ambulatory Visit (INDEPENDENT_AMBULATORY_CARE_PROVIDER_SITE_OTHER): Payer: Medicare Other | Admitting: Podiatry

## 2019-05-01 ENCOUNTER — Encounter: Payer: Self-pay | Admitting: Podiatry

## 2019-05-01 ENCOUNTER — Other Ambulatory Visit: Payer: Self-pay

## 2019-05-01 ENCOUNTER — Ambulatory Visit: Payer: Medicare Other | Admitting: Podiatry

## 2019-05-01 DIAGNOSIS — B351 Tinea unguium: Secondary | ICD-10-CM

## 2019-05-01 DIAGNOSIS — E1142 Type 2 diabetes mellitus with diabetic polyneuropathy: Secondary | ICD-10-CM

## 2019-05-01 DIAGNOSIS — M79676 Pain in unspecified toe(s): Secondary | ICD-10-CM | POA: Diagnosis not present

## 2019-05-01 DIAGNOSIS — L84 Corns and callosities: Secondary | ICD-10-CM | POA: Diagnosis not present

## 2019-05-01 NOTE — Progress Notes (Signed)
This patient returns to my office for at risk foot care.  This patient requires this care by a professional since this patient will be at risk due to having type 2 diabetes.  This patient is unable to cut nails herself since the patient cannot reach her nails.These nails are painful walking and wearing shoes.  This patient presents for at risk foot care today.  General Appearance  Alert, conversant and in no acute stress.  Vascular  Dorsalis pedis and posterior tibial  pulses are palpable  bilaterally.  Capillary return is within normal limits  bilaterally. Temperature is within normal limits  bilaterally.  Neurologic  Senn-Weinstein monofilament wire test within normal limits  bilaterally. Muscle power within normal limits bilaterally.  Nails Thick disfigured discolored nails with subungual debris  from hallux to fifth toes bilaterally. No evidence of bacterial infection or drainage bilaterally.  Orthopedic  No limitations of motion  feet .  No crepitus or effusions noted.  No bony pathology or digital deformities noted.  HAV  B/L.  Contracted digits 2-5  B/L.  Skin  normotropic skin with no porokeratosis noted bilaterally.  No signs of infections or ulcers noted.  Severe digital clavi second toe right foot.   Onychomycosis  Pain in right toes  Pain in left toes  Consent was obtained for treatment procedures.   Mechanical debridement of nails 1-5  bilaterally performed with a nail nipper.  Filed with dremel without incident. Debride clavi with a # 15 blade.   Return office visit   10 weeks                  Told patient to return for periodic foot care and evaluation due to potential at risk complications.   Gardiner Barefoot DPM

## 2019-05-08 ENCOUNTER — Telehealth: Payer: Self-pay

## 2019-05-08 NOTE — Telephone Encounter (Signed)
CONFIRMED AND SCREENED FOR 05-12-19 OV. 

## 2019-05-12 ENCOUNTER — Ambulatory Visit: Payer: Medicare Other | Admitting: Nurse Practitioner

## 2019-05-23 ENCOUNTER — Other Ambulatory Visit: Payer: Self-pay

## 2019-05-23 DIAGNOSIS — F411 Generalized anxiety disorder: Secondary | ICD-10-CM

## 2019-05-26 DIAGNOSIS — G4701 Insomnia due to medical condition: Secondary | ICD-10-CM | POA: Diagnosis not present

## 2019-05-26 DIAGNOSIS — G2581 Restless legs syndrome: Secondary | ICD-10-CM | POA: Diagnosis not present

## 2019-05-26 DIAGNOSIS — F0280 Dementia in other diseases classified elsewhere without behavioral disturbance: Secondary | ICD-10-CM | POA: Diagnosis not present

## 2019-05-26 DIAGNOSIS — G20 Parkinson's disease: Secondary | ICD-10-CM | POA: Diagnosis not present

## 2019-05-28 MED ORDER — ALPRAZOLAM 0.5 MG PO TABS: ORAL_TABLET | ORAL | 3 refills | Status: DC

## 2019-06-04 ENCOUNTER — Telehealth: Payer: Self-pay

## 2019-06-04 ENCOUNTER — Other Ambulatory Visit: Payer: Self-pay

## 2019-06-04 MED ORDER — CELECOXIB 200 MG PO CAPS: 200.0000 mg | ORAL_CAPSULE | Freq: Two times a day (BID) | ORAL | 1 refills | Status: DC | PRN

## 2019-06-04 NOTE — Telephone Encounter (Signed)
Lmom for 06-09-19 ov.

## 2019-06-04 NOTE — Telephone Encounter (Signed)
Confirmed appointment on 06/05/2019 and screened for covid. klh 

## 2019-06-05 ENCOUNTER — Ambulatory Visit (INDEPENDENT_AMBULATORY_CARE_PROVIDER_SITE_OTHER): Payer: Medicare Other | Admitting: Nurse Practitioner

## 2019-06-05 ENCOUNTER — Other Ambulatory Visit: Payer: Self-pay

## 2019-06-05 ENCOUNTER — Telehealth: Payer: Self-pay

## 2019-06-05 ENCOUNTER — Encounter: Payer: Self-pay | Admitting: Nurse Practitioner

## 2019-06-05 VITALS — BP 113/58 | HR 80 | Temp 97.2°F | Resp 16 | Ht 64.0 in | Wt 179.0 lb

## 2019-06-05 DIAGNOSIS — R112 Nausea with vomiting, unspecified: Secondary | ICD-10-CM | POA: Diagnosis not present

## 2019-06-05 DIAGNOSIS — N390 Urinary tract infection, site not specified: Secondary | ICD-10-CM | POA: Diagnosis not present

## 2019-06-05 DIAGNOSIS — R319 Hematuria, unspecified: Secondary | ICD-10-CM

## 2019-06-05 DIAGNOSIS — R300 Dysuria: Secondary | ICD-10-CM | POA: Diagnosis not present

## 2019-06-05 DIAGNOSIS — E1165 Type 2 diabetes mellitus with hyperglycemia: Secondary | ICD-10-CM

## 2019-06-05 LAB — POCT URINALYSIS DIPSTICK
Bilirubin, UA: NEGATIVE
Glucose, UA: NEGATIVE
Ketones, UA: NEGATIVE
Nitrite, UA: NEGATIVE
Protein, UA: POSITIVE — AB
Spec Grav, UA: 1.01 (ref 1.010–1.025)
Urobilinogen, UA: 0.2 E.U./dL
pH, UA: 7.5 (ref 5.0–8.0)

## 2019-06-05 MED ORDER — ONDANSETRON 4 MG PO TBDP: 4.0000 mg | ORAL_TABLET | Freq: Three times a day (TID) | ORAL | 1 refills | Status: DC | PRN

## 2019-06-05 MED ORDER — CIPROFLOXACIN HCL 500 MG PO TABS: 500.0000 mg | ORAL_TABLET | Freq: Two times a day (BID) | ORAL | 0 refills | Status: DC

## 2019-06-05 NOTE — Progress Notes (Signed)
[facility] [place], [place] [address]  Internal MEDICINE  Office Visit Note  Patient Name: Theresa Mills  [id number]  [id number]  Date of Service: 06/11/2019   Pt is here for a sick visit.  Chief Complaint  Patient presents with  . Urinary Tract Infection    blood in urine and burning sensation   . Nausea     The patient is here for acute visit. She states that she has been nauseated for several days. She states that dysuria and blood in the urine was noted earlier this week. She has decreased appetite and feels weak. She had chills one day. Does not believe she had fever.       Current Medication:  Outpatient Encounter Medications as of 06/05/2019  Medication Sig Note  . ALPRAZolam (XANAX) 0.5 MG tablet TAKE 1/2 TABLET BY MOUTH EVERY MORNING AND TAKE ONE TABLET BY MOUTH AT BEDTIME AS NEEDED   . ALPRAZolam (XANAX) 0.5 MG tablet TAKE ONE-HALF (1/2) TABLET BY MOUTH EVERY MORNING AND TAKE ONE TABLET AT BEDTIME AS NEEDED   . Calcium Carbonate-Vitamin D (CALCIUM HIGH POTENCY/VITAMIN D) 600-200 MG-UNIT TABS Take 1 tablet by mouth daily.    . carbidopa-levodopa (SINEMET CR) 50-200 MG tablet Take 1 tablet by mouth 2 (two) times daily.    . celecoxib (CELEBREX) 200 MG capsule Take 1 capsule (200 mg total) by mouth 2 (two) times daily as needed.   . citalopram (CELEXA) 40 MG tablet Take 40 mg by mouth daily.   Marland Kitchen glucose blood (ONETOUCH VERIO) test strip TEST BLOOD SUGAR BID   . levothyroxine (SYNTHROID) 175 MCG tablet Take 1 tablet (175 mcg total) by mouth daily.   . Melatonin 3 MG TABS Take by mouth.   . metFORMIN (GLUCOPHAGE) 500 MG tablet Take 500 mg by mouth 2 (two) times daily with a meal.    . mirtazapine (REMERON) 7.5 MG tablet Take 1 tablet (7.5 mg total) by mouth at bedtime.   . Multiple Vitamins-Minerals (MULTIVITAMIN WOMEN 50+ PO) Take 1 tablet by mouth daily.    Marland Kitchen omeprazole (PRILOSEC) 40 MG capsule Take 1 capsule (40 mg total) by mouth daily.    Glory Rosebush DELICA LANCETS 99991111 MISC TEST BLOOD SUGAR BID   . rOPINIRole (REQUIP XL) 2 MG 24 hr tablet Take 2 mg by mouth at bedtime.    . sertraline (ZOLOFT) 50 MG tablet Take 1 tablet (50 mg total) by mouth daily.   . simvastatin (ZOCOR) 40 MG tablet Take 1 tablet (40 mg total) by mouth at bedtime.   . [DISCONTINUED] ondansetron (ZOFRAN) 4 MG tablet TAKE ONE TABLET BY MOUTH EVERY 8 HOURS AS NEEDED FOR NAUSEA OR VOMITING 06/05/2019: change to orally disintegrating tablets   . ciprofloxacin (CIPRO) 500 MG tablet Take 1 tablet (500 mg total) by mouth 2 (two) times daily.   . ondansetron (ZOFRAN-ODT) 4 MG disintegrating tablet Take 1 tablet (4 mg total) by mouth every 8 (eight) hours as needed for nausea or vomiting.    No facility-administered encounter medications on file as of 06/05/2019.      Medical History: Past Medical History:  Diagnosis Date  . Anxiety   . Diabetes ([place])   . Difficult intravenous access   . GERD (gastroesophageal reflux disease)   . HBP (high blood pressure)   . Hyperlipidemia   . Osteoarthritis   . Parkinson disease ([place])   . Sleep apnea   . Thyroid disease      Today's Vitals  06/05/19 1031  BP: (!) 113/58  Pulse: 80  Resp: 16  Temp: (!) 97.2 F (36.2 C)  SpO2: 97%  Weight: 179 lb (81.2 kg)  Height: 5\' 4"  (1.626 m)   Body mass index is 30.73 kg/m.  Review of Systems  Constitutional: Positive for activity change and fatigue. Negative for appetite change and unexpected weight change.       Decreased appetite.  HENT: Negative for congestion, postnasal drip, rhinorrhea, sinus pressure, sinus pain and sore throat.   Respiratory: Positive for shortness of breath.   Cardiovascular: Negative for chest pain and palpitations.  Gastrointestinal: Positive for nausea. Negative for abdominal pain, constipation, diarrhea and vomiting.  Endocrine: Negative for cold intolerance, heat intolerance, polydipsia and polyuria.       Blood sugars doing well    Genitourinary: Positive for dysuria, frequency and urgency.  Musculoskeletal: Positive for back pain and myalgias.  Skin: Negative for rash.  Allergic/Immunologic: Negative for environmental allergies.  Neurological: Positive for weakness.  Hematological: Negative for adenopathy.  Psychiatric/Behavioral: Positive for dysphoric mood.    Physical Exam Vitals and nursing note reviewed.  Constitutional:      General: She is not in acute distress.    Appearance: Normal appearance. She is well-developed. She is ill-appearing. She is not diaphoretic.  HENT:     Head: Normocephalic and atraumatic.     Mouth/Throat:     Pharynx: No oropharyngeal exudate.  Eyes:     Pupils: Pupils are equal, round, and reactive to light.  Neck:     Thyroid: No thyromegaly.     Vascular: No carotid bruit or JVD.     Trachea: No tracheal deviation.  Cardiovascular:     Rate and Rhythm: Normal rate. Rhythm irregular.     Pulses: Normal pulses.     Heart sounds: Normal heart sounds. No murmur. No friction rub. No gallop.   Pulmonary:     Effort: Pulmonary effort is normal. No respiratory distress.     Breath sounds: Normal breath sounds. No wheezing or rales.  Chest:     Chest wall: No tenderness.  Abdominal:     General: Bowel sounds are normal.     Palpations: Abdomen is soft.     Tenderness: There is no abdominal tenderness.  Genitourinary:    Comments: Urine sample positive for protein, large WBC and large blood.  Musculoskeletal:        General: Normal range of motion.     Cervical back: Normal range of motion and neck supple.     Comments: Generalized weakness noted. Uses a walker to help with ambulation.   Lymphadenopathy:     Cervical: No cervical adenopathy.  Skin:    General: Skin is warm and dry.  Neurological:     Mental Status: She is alert and oriented to person, place, and time. Mental status is at baseline.     Cranial Nerves: No cranial nerve deficit.  Psychiatric:        Mood  and Affect: Mood normal.        Behavior: Behavior normal.        Thought Content: Thought content normal.        Judgment: Judgment normal.   Assessment/Plan: 1. Urinary tract infection with hematuria, site unspecified Start cipro 500mg  bid for 10 days. Send urine for culture and sensitivity and adjust antibiotics as indicated.  - ciprofloxacin (CIPRO) 500 MG tablet; Take 1 tablet (500 mg total) by mouth 2 (two) times daily.  Dispense: 20  tablet; Refill: 0 - CULTURE, URINE COMPREHENSIVE  2. Nausea and vomiting, intractability of vomiting not specified, unspecified vomiting type zofran 4mg  orally disintegrating tablets given. The 'BRAT' diet is suggested, then progress to diet as tolerated as symptoms abate. Call if bloody stools, persistent diarrhea, vomiting, fever or abdominal pain. - ondansetron (ZOFRAN-ODT) 4 MG disintegrating tablet; Take 1 tablet (4 mg total) by mouth every 8 (eight) hours as needed for nausea or vomiting.  Dispense: 30 tablet; Refill: 1  3. Dysuria Urine positive for infection today.  - POCT Urinalysis Dipstick  4. Type 2 diabetes mellitus with hyperglycemia, without long-term current use of insulin ([place]) Continue diabetic medication as prescribed   General Counseling: sarah legner understanding of the findings of todays visit and agrees with plan of treatment. I have discussed any further diagnostic evaluation that may be needed or ordered today. We also reviewed her medications today. she has been encouraged to call the office with any questions or concerns that should arise related to todays visit.    Counseling:  This patient was seen by Leretha Pol FNP Collaboration with Dr Lavera Guise as a part of collaborative care agreement  Orders Placed This Encounter  Procedures  . CULTURE, URINE COMPREHENSIVE  . POCT Urinalysis Dipstick    Meds ordered this encounter  Medications  . ondansetron (ZOFRAN-ODT) 4 MG disintegrating tablet    Sig: Take 1  tablet (4 mg total) by mouth every 8 (eight) hours as needed for nausea or vomiting.    Dispense:  30 tablet    Refill:  1    Change to ODT as patient is having severe nausea and vomiting with UTI    Order Specific Question:   Supervising Provider    Answer:   Lavera Guise [facility]  . ciprofloxacin (CIPRO) 500 MG tablet    Sig: Take 1 tablet (500 mg total) by mouth 2 (two) times daily.    Dispense:  20 tablet    Refill:  0    Order Specific Question:   Supervising Provider    Answer:   Lavera Guise [id number]    Time spent: 30 Minutes

## 2019-06-05 NOTE — Telephone Encounter (Signed)
Phar called that Cipro interact with simvastatin and as heather ok to fill pt tolerate well

## 2019-06-08 LAB — CULTURE, URINE COMPREHENSIVE

## 2019-06-09 ENCOUNTER — Ambulatory Visit: Payer: Medicare Other | Admitting: Nurse Practitioner

## 2019-06-09 NOTE — Progress Notes (Signed)
Patient started on cipro BID at time of visit.

## 2019-06-11 DIAGNOSIS — R319 Hematuria, unspecified: Secondary | ICD-10-CM | POA: Insufficient documentation

## 2019-06-11 DIAGNOSIS — N390 Urinary tract infection, site not specified: Secondary | ICD-10-CM | POA: Insufficient documentation

## 2019-06-12 ENCOUNTER — Other Ambulatory Visit: Payer: Self-pay

## 2019-06-12 MED ORDER — OMEPRAZOLE 40 MG PO CPDR: 40.0000 mg | DELAYED_RELEASE_CAPSULE | Freq: Every day | ORAL | 1 refills | Status: DC

## 2019-06-17 ENCOUNTER — Telehealth: Payer: Self-pay

## 2019-06-17 NOTE — Telephone Encounter (Signed)
Confirmed appt for 06-19-19 but was unable to screen bc call was disconnected. Called back and still was not a success.

## 2019-06-19 ENCOUNTER — Encounter: Payer: Self-pay | Admitting: Nurse Practitioner

## 2019-06-19 ENCOUNTER — Other Ambulatory Visit: Payer: Self-pay

## 2019-06-19 ENCOUNTER — Ambulatory Visit: Payer: Medicare Other | Admitting: Nurse Practitioner

## 2019-06-19 VITALS — BP 127/65 | HR 66 | Temp 97.4°F | Resp 16 | Ht 64.0 in | Wt 182.0 lb

## 2019-06-20 ENCOUNTER — Telehealth: Payer: Self-pay

## 2019-06-20 ENCOUNTER — Ambulatory Visit (INDEPENDENT_AMBULATORY_CARE_PROVIDER_SITE_OTHER): Payer: Medicare Other

## 2019-06-20 DIAGNOSIS — N390 Urinary tract infection, site not specified: Secondary | ICD-10-CM | POA: Diagnosis not present

## 2019-06-20 DIAGNOSIS — R300 Dysuria: Secondary | ICD-10-CM

## 2019-06-20 LAB — POCT URINALYSIS DIPSTICK
Bilirubin, UA: NEGATIVE
Blood, UA: NEGATIVE
Glucose, UA: NEGATIVE
Ketones, UA: NEGATIVE
Nitrite, UA: NEGATIVE
Protein, UA: POSITIVE — AB
Spec Grav, UA: 1.015 (ref 1.010–1.025)
Urobilinogen, UA: NEGATIVE E.U./dL — AB
pH, UA: 5 (ref 5.0–8.0)

## 2019-06-20 NOTE — Progress Notes (Signed)
Pt caregiver dropped urine for follow up we dip and showed to heather and send urine culture and  also pt had follow up on Tuesday

## 2019-06-20 NOTE — Telephone Encounter (Signed)
Confirmed 06-24-19 ov as virtual.

## 2019-06-23 LAB — CULTURE, URINE COMPREHENSIVE

## 2019-06-24 ENCOUNTER — Encounter: Payer: Self-pay | Admitting: Nurse Practitioner

## 2019-06-24 ENCOUNTER — Ambulatory Visit (INDEPENDENT_AMBULATORY_CARE_PROVIDER_SITE_OTHER): Payer: Medicare Other | Admitting: Nurse Practitioner

## 2019-06-24 VITALS — BP 117/75 | Temp 97.2°F | Resp 16 | Ht 64.0 in

## 2019-06-24 DIAGNOSIS — R112 Nausea with vomiting, unspecified: Secondary | ICD-10-CM | POA: Diagnosis not present

## 2019-06-24 DIAGNOSIS — R319 Hematuria, unspecified: Secondary | ICD-10-CM | POA: Diagnosis not present

## 2019-06-24 DIAGNOSIS — N390 Urinary tract infection, site not specified: Secondary | ICD-10-CM

## 2019-06-24 DIAGNOSIS — E1165 Type 2 diabetes mellitus with hyperglycemia: Secondary | ICD-10-CM

## 2019-06-24 NOTE — Progress Notes (Signed)
[facility] [place], [place] [address]  Internal MEDICINE  Telephone Visit  Patient Name: Theresa Mills  [id number]  [id number]  Date of Service: 06/24/2019  I connected with the patient at 12:33pm by telephone and verified the patients identity using two identifiers.   I discussed the limitations, risks, security and privacy concerns of performing an evaluation and management service by telephone and the availability of in person appointments. I also discussed with the patient that there may be a patient responsible charge related to the service.  The patient expressed understanding and agrees to proceed.    Chief Complaint  Patient presents with  . Telephone Assessment  . Telephone Screen  . Urinary Tract Infection    The patient has been contacted via telephone for follow up visit due to concerns for spread of novel coronavirus. The patent was treated for significant  uti at her most recent visit. She has completed the full round of cipro 500mg  twice daily for 10 days. She states that she is feeling much better. Her u/a indicates resolution of infection. There is no longer blood or nitrites in the urine. There are trace WBC. She states that her appetite has improved. She does continue to get nauseated at times, however, this is not a new symptom for her. She takes prescribed zofran and this does help. She denies new concerns or complaints.       Current Medication: Outpatient Encounter Medications as of 06/24/2019  Medication Sig  . ALPRAZolam (XANAX) 0.5 MG tablet TAKE 1/2 TABLET BY MOUTH EVERY MORNING AND TAKE ONE TABLET BY MOUTH AT BEDTIME AS NEEDED  . ALPRAZolam (XANAX) 0.5 MG tablet TAKE ONE-HALF (1/2) TABLET BY MOUTH EVERY MORNING AND TAKE ONE TABLET AT BEDTIME AS NEEDED  . Calcium Carbonate-Vitamin D (CALCIUM HIGH POTENCY/VITAMIN D) 600-200 MG-UNIT TABS Take 1 tablet by mouth daily.   . carbidopa-levodopa (SINEMET CR) 50-200 MG tablet Take 1 tablet by  mouth 2 (two) times daily.   . celecoxib (CELEBREX) 200 MG capsule Take 1 capsule (200 mg total) by mouth 2 (two) times daily as needed.  . citalopram (CELEXA) 40 MG tablet Take 40 mg by mouth daily.  Marland Kitchen glucose blood (ONETOUCH VERIO) test strip TEST BLOOD SUGAR BID  . levothyroxine (SYNTHROID) 175 MCG tablet Take 1 tablet (175 mcg total) by mouth daily.  . Melatonin 3 MG TABS Take by mouth.  . metFORMIN (GLUCOPHAGE) 500 MG tablet Take 500 mg by mouth 2 (two) times daily with a meal.   . mirtazapine (REMERON) 7.5 MG tablet Take 1 tablet (7.5 mg total) by mouth at bedtime.  . Multiple Vitamins-Minerals (MULTIVITAMIN WOMEN 50+ PO) Take 1 tablet by mouth daily.   Marland Kitchen omeprazole (PRILOSEC) 40 MG capsule Take 1 capsule (40 mg total) by mouth daily.  . ondansetron (ZOFRAN-ODT) 4 MG disintegrating tablet Take 1 tablet (4 mg total) by mouth every 8 (eight) hours as needed for nausea or vomiting.  Glory Rosebush DELICA LANCETS 99991111 MISC TEST BLOOD SUGAR BID  . rOPINIRole (REQUIP XL) 2 MG 24 hr tablet Take 2 mg by mouth at bedtime.   . sertraline (ZOLOFT) 50 MG tablet Take 1 tablet (50 mg total) by mouth daily.  . simvastatin (ZOCOR) 40 MG tablet Take 1 tablet (40 mg total) by mouth at bedtime.   No facility-administered encounter medications on file as of 06/24/2019.    Surgical History: Past Surgical History:  Procedure Laterality Date  . ABDOMINAL HYSTERECTOMY    .  CATARACT EXTRACTION Bilateral   . CHOLECYSTECTOMY    . COLONOSCOPY WITH PROPOFOL N/A 04/02/2015   Procedure: COLONOSCOPY WITH PROPOFOL;  Surgeon: Josefine Class, MD;  Location[facility] ENDOSCOPY;  Service: Endoscopy;  Laterality: N/A;  . ESOPHAGOGASTRODUODENOSCOPY (EGD) WITH PROPOFOL N/A 04/02/2015   Procedure: ESOPHAGOGASTRODUODENOSCOPY (EGD) WITH PROPOFOL;  Surgeon: Josefine Class, MD;  Location: [facility] ENDOSCOPY;  Service: Endoscopy;  Laterality: N/A;  . GALLBLADDER SURGERY    . LAPAROSCOPIC HYSTERECTOMY    . THYROIDECTOMY Left   .  TONSILLECTOMY    . TONSILLECTOMY Bilateral     Medical History: Past Medical History:  Diagnosis Date  . Anxiety   . Diabetes ([place])   . Difficult intravenous access   . GERD (gastroesophageal reflux disease)   . HBP (high blood pressure)   . Hyperlipidemia   . Osteoarthritis   . Parkinson disease ([place])   . Sleep apnea   . Thyroid disease     Family History: Family History  Problem Relation Age of Onset  . Asthma Father   . Cancer Sister   . Stroke Maternal Uncle   . Heart disease Maternal Uncle   . Diabetes Maternal Uncle     Social History   Socioeconomic History  . Marital status: Widowed    Spouse name: Not on file  . Number of children: Not on file  . Years of education: Not on file  . Highest education level: Not on file  Occupational History  . Not on file  Tobacco Use  . Smoking status: Never Smoker  . Smokeless tobacco: Never Used  Substance and Sexual Activity  . Alcohol use: No  . Drug use: No  . Sexual activity: Not on file  Other Topics Concern  . Not on file  Social History Narrative  . Not on file   Social Determinants of Health   Financial Resource Strain:   . Difficulty of Paying Living Expenses:   Food Insecurity:   . Worried About Charity fundraiser in the Last Year:   . Arboriculturist in the Last Year:   Transportation Needs:   . Film/video editor (Medical):   Marland Kitchen Lack of Transportation (Non-Medical):   Physical Activity:   . Days of Exercise per Week:   . Minutes of Exercise per Session:   Stress:   . Feeling of Stress :   Social Connections:   . Frequency of Communication with Friends and Family:   . Frequency of Social Gatherings with Friends and Family:   . Attends Religious Services:   . Active Member of Clubs or Organizations:   . Attends Archivist Meetings:   Marland Kitchen Marital Status:   Intimate Partner Violence:   . Fear of Current or Ex-Partner:   . Emotionally Abused:   Marland Kitchen Physically Abused:   . Sexually  Abused:       Review of Systems  Constitutional: Positive for activity change and fatigue. Negative for appetite change and unexpected weight change.       Appetite improved.   HENT: Negative for congestion, postnasal drip, rhinorrhea, sinus pressure, sinus pain and sore throat.   Respiratory: Positive for shortness of breath.   Cardiovascular: Negative for chest pain and palpitations.  Gastrointestinal: Positive for nausea. Negative for abdominal pain, constipation, diarrhea and vomiting.  Endocrine: Negative for cold intolerance, heat intolerance, polydipsia and polyuria.       Blood sugars doing well   Genitourinary: Negative for dysuria, frequency and urgency.  Symptoms improved. U/a recently analyzed shows protein and trace WBC.   Musculoskeletal: Positive for back pain and myalgias.  Skin: Negative for rash.  Allergic/Immunologic: Negative for environmental allergies.  Neurological: Positive for weakness.  Hematological: Negative for adenopathy.  Psychiatric/Behavioral: Positive for dysphoric mood.    Today's Vitals   06/24/19 1105  BP: 117/75  Resp: 16  Temp: (!) 97.2 F (36.2 C)  Height: 5\' 4"  (1.626 m)   Body mass index is 31.24 kg/m.  Observation/Objective:   The patient is alert and oriented. She is pleasant and answers all questions appropriately. Breathing is non-labored. She is in no acute distress at this time.    Assessment/Plan: 1. Urinary tract infection with hematuria, site unspecified Protein and trace WBC on recent u/a. Improved since last check. Will send for culture and sensitivity to ensure resolution of infection.   2. Nausea and vomiting, intractability of vomiting not specified, unspecified vomiting type Improved. She continues to take zofran as needed and as prescribed   3. Type 2 diabetes mellitus with hyperglycemia, without long-term current use of insulin ([place]) Continue diabetic medication as prescribed. Check HgbA1c at next,  in-office visit.   General Counseling: celestial venditto understanding of the findings of today's phone visit and agrees with plan of treatment. I have discussed any further diagnostic evaluation that may be needed or ordered today. We also reviewed her medications today. she has been encouraged to call the office with any questions or concerns that should arise related to todays visit.  This patient was seen by Leretha Pol FNP Collaboration with Dr Lavera Guise as a part of collaborative care agreement   Time spent: 22 Minutes    Dr Lavera Guise Internal medicine

## 2019-07-10 ENCOUNTER — Other Ambulatory Visit: Payer: Self-pay

## 2019-07-10 ENCOUNTER — Ambulatory Visit (INDEPENDENT_AMBULATORY_CARE_PROVIDER_SITE_OTHER): Payer: Medicare Other | Admitting: Podiatry

## 2019-07-10 ENCOUNTER — Encounter: Payer: Self-pay | Admitting: Podiatry

## 2019-07-10 DIAGNOSIS — B351 Tinea unguium: Secondary | ICD-10-CM

## 2019-07-10 DIAGNOSIS — M79676 Pain in unspecified toe(s): Secondary | ICD-10-CM

## 2019-07-10 DIAGNOSIS — L84 Corns and callosities: Secondary | ICD-10-CM

## 2019-07-10 DIAGNOSIS — E1142 Type 2 diabetes mellitus with diabetic polyneuropathy: Secondary | ICD-10-CM | POA: Diagnosis not present

## 2019-07-10 NOTE — Progress Notes (Signed)
This patient returns to my office for at risk foot care.  This patient requires this care by a professional since this patient will be at risk due to having type 2 diabetes.  This patient is unable to cut nails herself since the patient cannot reach her nails.These nails are painful walking and wearing shoes.  This patient presents for at risk foot care today.  She also says she has had multiple falls and believes she has injured the second toenail area  right foot.    General Appearance  Alert, conversant and in no acute stress.  Vascular  Dorsalis pedis and posterior tibial  pulses are palpable  bilaterally.  Capillary return is within normal limits  bilaterally. Temperature is within normal limits  bilaterally.  Neurologic  Senn-Weinstein monofilament wire test within normal limits  bilaterally. Muscle power within normal limits bilaterally.  Nails Thick disfigured discolored nails with subungual debris  from hallux to fifth toes bilaterally. No evidence of bacterial infection or drainage bilaterally.  Orthopedic  No limitations of motion  feet .  No crepitus or effusions noted.  No bony pathology or digital deformities noted.  HAV  B/L.  Contracted digits 2-5  B/L.  Skin  normotropic skin with no porokeratosis noted bilaterally.  No signs of infections or ulcers noted.  Severe digital clavi second toe right foot. The skin on the lateral side of second toenail is peeling free with normal skin replacing the peeling skin.  No infections noted.  Onychomycosis  Pain in right toes  Pain in left toes  Clavi second toe right foot.  Consent was obtained for treatment procedures.   Mechanical debridement of nails 1-5  bilaterally performed with a nail nipper.  Filed with dremel without incident. Debride clavi with a dremel tool.  This patient was offered anesthesia to allow my treatment be painfree but she refused.  This injured area appears to be healing.  Padding second toe was dispensed.     Return  office visit   9 weeks                  Told patient to return for periodic foot care and evaluation due to potential at risk complications.   Gardiner Barefoot DPM

## 2019-07-29 ENCOUNTER — Telehealth: Payer: Self-pay

## 2019-07-29 ENCOUNTER — Other Ambulatory Visit: Payer: Self-pay

## 2019-07-29 MED ORDER — CELECOXIB 200 MG PO CAPS: 200.0000 mg | ORAL_CAPSULE | Freq: Two times a day (BID) | ORAL | 1 refills | Status: DC | PRN

## 2019-07-29 NOTE — Telephone Encounter (Signed)
CMN SIGNED FOR OXYGEN AND PLACED IN [facility] FOLDER.

## 2019-08-04 ENCOUNTER — Encounter: Payer: Self-pay | Admitting: Adult Health

## 2019-08-04 ENCOUNTER — Ambulatory Visit (INDEPENDENT_AMBULATORY_CARE_PROVIDER_SITE_OTHER): Payer: Medicare Other | Admitting: Adult Health

## 2019-08-04 VITALS — Resp 16 | Ht 64.0 in | Wt 182.0 lb

## 2019-08-04 DIAGNOSIS — W19XXXD Unspecified fall, subsequent encounter: Secondary | ICD-10-CM

## 2019-08-04 DIAGNOSIS — I10 Essential (primary) hypertension: Secondary | ICD-10-CM | POA: Diagnosis not present

## 2019-08-04 DIAGNOSIS — R29898 Other symptoms and signs involving the musculoskeletal system: Secondary | ICD-10-CM

## 2019-08-04 NOTE — Progress Notes (Signed)
[facility] [place], [place] [address]  Internal MEDICINE  Telephone Visit  Patient Name: Theresa Mills  [id number]  [id number]  Date of Service: 08/05/2019  I connected with the patient at 415 by telephone and verified the patients identity using two identifiers.   I discussed the limitations, risks, security and privacy concerns of performing an evaluation and management service by telephone and the availability of in person appointments. I also discussed with the patient that there may be a patient responsible charge related to the service.  The patient expressed understanding and agrees to proceed.    Chief Complaint  Patient presents with  . Telephone Assessment    fell out of bed, possible therapy  . Telephone Screen    HPI  Patient is seen via telephone.  She reports she fell out of bed two nights ago and is very stiff.  She ended up on her stomach.  Today she reports while using her walker, her knees are giving out on her.  She was on the floor today.  EMS was called and she was assisted her to a chair.  Her care giver is concerned because no one is going to be here to help her.  She has been taking tylenol today.     Current Medication: No facility-administered encounter medications on file as of 08/04/2019.   Outpatient Encounter Medications as of 08/04/2019  Medication Sig  . ALPRAZolam (XANAX) 0.5 MG tablet TAKE 1/2 TABLET BY MOUTH EVERY MORNING AND TAKE ONE TABLET BY MOUTH AT BEDTIME AS NEEDED  . ALPRAZolam (XANAX) 0.5 MG tablet TAKE ONE-HALF (1/2) TABLET BY MOUTH EVERY MORNING AND TAKE ONE TABLET AT BEDTIME AS NEEDED  . Calcium Carbonate-Vitamin D (CALCIUM HIGH POTENCY/VITAMIN D) 600-200 MG-UNIT TABS Take 1 tablet by mouth daily.   . carbidopa-levodopa (SINEMET CR) 50-200 MG tablet Take 1 tablet by mouth 2 (two) times daily.   . celecoxib (CELEBREX) 200 MG capsule Take 1 capsule (200 mg total) by mouth 2 (two) times daily as needed.  .  citalopram (CELEXA) 40 MG tablet Take 40 mg by mouth daily.  Marland Kitchen glucose blood (ONETOUCH VERIO) test strip TEST BLOOD SUGAR BID  . levothyroxine (SYNTHROID) 175 MCG tablet Take 1 tablet (175 mcg total) by mouth daily.  . Melatonin 3 MG TABS Take by mouth.  . metFORMIN (GLUCOPHAGE) 500 MG tablet Take 500 mg by mouth 2 (two) times daily with a meal.   . mirtazapine (REMERON) 7.5 MG tablet Take 1 tablet (7.5 mg total) by mouth at bedtime.  . Multiple Vitamins-Minerals (MULTIVITAMIN WOMEN 50+ PO) Take 1 tablet by mouth daily.   Marland Kitchen omeprazole (PRILOSEC) 40 MG capsule Take 1 capsule (40 mg total) by mouth daily.  . ondansetron (ZOFRAN-ODT) 4 MG disintegrating tablet Take 1 tablet (4 mg total) by mouth every 8 (eight) hours as needed for nausea or vomiting.  Glory Rosebush DELICA LANCETS 45X MISC TEST BLOOD SUGAR BID  . rOPINIRole (REQUIP XL) 2 MG 24 hr tablet Take 2 mg by mouth at bedtime.   . sertraline (ZOLOFT) 50 MG tablet Take 1 tablet (50 mg total) by mouth daily.  . simvastatin (ZOCOR) 40 MG tablet Take 1 tablet (40 mg total) by mouth at bedtime.    Surgical History: Past Surgical History:  Procedure Laterality Date  . ABDOMINAL HYSTERECTOMY    . CATARACT EXTRACTION Bilateral   . CHOLECYSTECTOMY    . COLONOSCOPY WITH PROPOFOL N/A 04/02/2015   Procedure: COLONOSCOPY WITH PROPOFOL;  Surgeon: Josefine Class, MD;  Location: [facility] ENDOSCOPY;  Service: Endoscopy;  Laterality: N/A;  . ESOPHAGOGASTRODUODENOSCOPY (EGD) WITH PROPOFOL N/A 04/02/2015   Procedure: ESOPHAGOGASTRODUODENOSCOPY (EGD) WITH PROPOFOL;  Surgeon: Josefine Class, MD;  Location: [facility] ENDOSCOPY;  Service: Endoscopy;  Laterality: N/A;  . GALLBLADDER SURGERY    . LAPAROSCOPIC HYSTERECTOMY    . THYROIDECTOMY Left   . TONSILLECTOMY    . TONSILLECTOMY Bilateral     Medical History: Past Medical History:  Diagnosis Date  . Anxiety   . Diabetes ([place])   . Difficult intravenous access   . GERD (gastroesophageal reflux disease)    . HBP (high blood pressure)   . Hyperlipidemia   . Osteoarthritis   . Parkinson disease ([place])   . Sleep apnea   . Thyroid disease     Family History: Family History  Problem Relation Age of Onset  . Asthma Father   . Cancer Sister   . Stroke Maternal Uncle   . Heart disease Maternal Uncle   . Diabetes Maternal Uncle     Social History   Socioeconomic History  . Marital status: Widowed    Spouse name: Not on file  . Number of children: Not on file  . Years of education: Not on file  . Highest education level: Not on file  Occupational History  . Not on file  Tobacco Use  . Smoking status: Never Smoker  . Smokeless tobacco: Never Used  Substance and Sexual Activity  . Alcohol use: No  . Drug use: No  . Sexual activity: Not on file  Other Topics Concern  . Not on file  Social History Narrative  . Not on file   Social Determinants of Health   Financial Resource Strain:   . Difficulty of Paying Living Expenses:   Food Insecurity:   . Worried About Charity fundraiser in the Last Year:   . Arboriculturist in the Last Year:   Transportation Needs:   . Film/video editor (Medical):   Marland Kitchen Lack of Transportation (Non-Medical):   Physical Activity:   . Days of Exercise per Week:   . Minutes of Exercise per Session:   Stress:   . Feeling of Stress :   Social Connections:   . Frequency of Communication with Friends and Family:   . Frequency of Social Gatherings with Friends and Family:   . Attends Religious Services:   . Active Member of Clubs or Organizations:   . Attends Archivist Meetings:   Marland Kitchen Marital Status:   Intimate Partner Violence:   . Fear of Current or Ex-Partner:   . Emotionally Abused:   Marland Kitchen Physically Abused:   . Sexually Abused:       Review of Systems  Constitutional: Negative for chills, fatigue and unexpected weight change.  HENT: Negative for congestion, rhinorrhea, sneezing and sore throat.   Eyes: Negative for photophobia,  pain and redness.  Respiratory: Negative for cough, chest tightness and shortness of breath.   Cardiovascular: Negative for chest pain and palpitations.  Gastrointestinal: Negative for abdominal pain, constipation, diarrhea, nausea and vomiting.  Endocrine: Negative.   Genitourinary: Negative for dysuria and frequency.  Musculoskeletal: Negative for arthralgias, back pain, joint swelling and neck pain.  Skin: Negative for rash.  Allergic/Immunologic: Negative.   Neurological: Negative for tremors and numbness.  Hematological: Negative for adenopathy. Does not bruise/bleed easily.  Psychiatric/Behavioral: Negative for behavioral problems and sleep disturbance. The patient is not nervous/anxious.  Vital Signs: Resp 16   Ht 5\' 4"  (1.626 m)   Wt 182 lb (82.6 kg)   BMI 31.24 kg/m    Observation/Objective:  Well sounding, nad noted.    Assessment/Plan: 1. Fall, subsequent encounter Patient referral to PT for evaluation and treatment.  - Ambulatory referral to Physical Therapy  2. Essential hypertension Continue to monitor.   3. Weakness of both lower extremities - Ambulatory referral to Physical Therapy  General Counseling: Theresa Mills understanding of the findings of today's phone visit and agrees with plan of treatment. I have discussed any further diagnostic evaluation that may be needed or ordered today. We also reviewed her medications today. she has been encouraged to call the office with any questions or concerns that should arise related to todays visit.    Orders Placed This Encounter  Procedures  . Ambulatory referral to Physical Therapy    No orders of the defined types were placed in this encounter.   Time spent: [facility] Internal medicine

## 2019-08-05 ENCOUNTER — Emergency Department: Payer: Medicare Other

## 2019-08-05 ENCOUNTER — Inpatient Hospital Stay
Admission: EM | Admit: 2019-08-05 | Discharge: 2019-08-09 | DRG: 566 | Disposition: A | Discharge status: Skilled Nursing Facility | Payer: Medicare Other | Attending: Internal Medicine | Admitting: Internal Medicine

## 2019-08-05 ENCOUNTER — Encounter: Payer: Self-pay | Admitting: Emergency Medicine

## 2019-08-05 ENCOUNTER — Other Ambulatory Visit: Payer: Self-pay

## 2019-08-05 DIAGNOSIS — R531 Weakness: Secondary | ICD-10-CM

## 2019-08-05 DIAGNOSIS — F411 Generalized anxiety disorder: Secondary | ICD-10-CM | POA: Diagnosis not present

## 2019-08-05 DIAGNOSIS — F419 Anxiety disorder, unspecified: Secondary | ICD-10-CM | POA: Diagnosis present

## 2019-08-05 DIAGNOSIS — G4730 Sleep apnea, unspecified: Secondary | ICD-10-CM | POA: Diagnosis present

## 2019-08-05 DIAGNOSIS — Z833 Family history of diabetes mellitus: Secondary | ICD-10-CM

## 2019-08-05 DIAGNOSIS — Z9049 Acquired absence of other specified parts of digestive tract: Secondary | ICD-10-CM

## 2019-08-05 DIAGNOSIS — Z8249 Family history of ischemic heart disease and other diseases of the circulatory system: Secondary | ICD-10-CM

## 2019-08-05 DIAGNOSIS — Z885 Allergy status to narcotic agent status: Secondary | ICD-10-CM

## 2019-08-05 DIAGNOSIS — E785 Hyperlipidemia, unspecified: Secondary | ICD-10-CM | POA: Diagnosis present

## 2019-08-05 DIAGNOSIS — M6282 Rhabdomyolysis: Secondary | ICD-10-CM | POA: Diagnosis not present

## 2019-08-05 DIAGNOSIS — Z20822 Contact with and (suspected) exposure to covid-19: Secondary | ICD-10-CM | POA: Diagnosis not present

## 2019-08-05 DIAGNOSIS — Z888 Allergy status to other drugs, medicaments and biological substances status: Secondary | ICD-10-CM

## 2019-08-05 DIAGNOSIS — F0280 Dementia in other diseases classified elsewhere without behavioral disturbance: Secondary | ICD-10-CM | POA: Diagnosis not present

## 2019-08-05 DIAGNOSIS — I10 Essential (primary) hypertension: Secondary | ICD-10-CM | POA: Diagnosis present

## 2019-08-05 DIAGNOSIS — Z79899 Other long term (current) drug therapy: Secondary | ICD-10-CM

## 2019-08-05 DIAGNOSIS — M160 Bilateral primary osteoarthritis of hip: Secondary | ICD-10-CM | POA: Diagnosis not present

## 2019-08-05 DIAGNOSIS — Z79890 Hormone replacement therapy (postmenopausal): Secondary | ICD-10-CM

## 2019-08-05 DIAGNOSIS — G20 Parkinson's disease: Secondary | ICD-10-CM | POA: Diagnosis present

## 2019-08-05 DIAGNOSIS — R52 Pain, unspecified: Secondary | ICD-10-CM | POA: Diagnosis not present

## 2019-08-05 DIAGNOSIS — M79605 Pain in left leg: Secondary | ICD-10-CM | POA: Diagnosis not present

## 2019-08-05 DIAGNOSIS — S92511A Displaced fracture of proximal phalanx of right lesser toe(s), initial encounter for closed fracture: Secondary | ICD-10-CM | POA: Diagnosis not present

## 2019-08-05 DIAGNOSIS — R296 Repeated falls: Secondary | ICD-10-CM | POA: Diagnosis present

## 2019-08-05 DIAGNOSIS — M25561 Pain in right knee: Secondary | ICD-10-CM | POA: Diagnosis not present

## 2019-08-05 DIAGNOSIS — E039 Hypothyroidism, unspecified: Secondary | ICD-10-CM | POA: Diagnosis not present

## 2019-08-05 DIAGNOSIS — M159 Polyosteoarthritis, unspecified: Secondary | ICD-10-CM

## 2019-08-05 DIAGNOSIS — Y92009 Unspecified place in unspecified non-institutional (private) residence as the place of occurrence of the external cause: Secondary | ICD-10-CM

## 2019-08-05 DIAGNOSIS — F32A Depression, unspecified: Secondary | ICD-10-CM | POA: Diagnosis present

## 2019-08-05 DIAGNOSIS — M479 Spondylosis, unspecified: Secondary | ICD-10-CM | POA: Diagnosis present

## 2019-08-05 DIAGNOSIS — M170 Bilateral primary osteoarthritis of knee: Secondary | ICD-10-CM | POA: Diagnosis present

## 2019-08-05 DIAGNOSIS — F329 Major depressive disorder, single episode, unspecified: Secondary | ICD-10-CM | POA: Diagnosis present

## 2019-08-05 DIAGNOSIS — S92512A Displaced fracture of proximal phalanx of left lesser toe(s), initial encounter for closed fracture: Secondary | ICD-10-CM | POA: Diagnosis not present

## 2019-08-05 DIAGNOSIS — M5489 Other dorsalgia: Secondary | ICD-10-CM | POA: Diagnosis not present

## 2019-08-05 DIAGNOSIS — K219 Gastro-esophageal reflux disease without esophagitis: Secondary | ICD-10-CM | POA: Diagnosis present

## 2019-08-05 DIAGNOSIS — Z90710 Acquired absence of both cervix and uterus: Secondary | ICD-10-CM

## 2019-08-05 DIAGNOSIS — W19XXXA Unspecified fall, initial encounter: Secondary | ICD-10-CM | POA: Diagnosis present

## 2019-08-05 DIAGNOSIS — I214 Non-ST elevation (NSTEMI) myocardial infarction: Secondary | ICD-10-CM | POA: Diagnosis not present

## 2019-08-05 DIAGNOSIS — S0990XA Unspecified injury of head, initial encounter: Secondary | ICD-10-CM | POA: Diagnosis not present

## 2019-08-05 DIAGNOSIS — E860 Dehydration: Secondary | ICD-10-CM | POA: Diagnosis present

## 2019-08-05 DIAGNOSIS — T796XXA Traumatic ischemia of muscle, initial encounter: Secondary | ICD-10-CM | POA: Diagnosis not present

## 2019-08-05 DIAGNOSIS — S199XXA Unspecified injury of neck, initial encounter: Secondary | ICD-10-CM | POA: Diagnosis not present

## 2019-08-05 DIAGNOSIS — E876 Hypokalemia: Secondary | ICD-10-CM | POA: Diagnosis present

## 2019-08-05 DIAGNOSIS — E119 Type 2 diabetes mellitus without complications: Secondary | ICD-10-CM

## 2019-08-05 DIAGNOSIS — Z7984 Long term (current) use of oral hypoglycemic drugs: Secondary | ICD-10-CM

## 2019-08-05 LAB — BASIC METABOLIC PANEL
Anion gap: 10 (ref 5–15)
BUN: 28 mg/dL — ABNORMAL HIGH (ref 8–23)
CO2: 24 mmol/L (ref 22–32)
Calcium: 9 mg/dL (ref 8.9–10.3)
Chloride: 109 mmol/L (ref 98–111)
Creatinine, Ser: 0.55 mg/dL (ref 0.44–1.00)
GFR calc Af Amer: >60 mL/min (ref >60)
GFR calc non Af Amer: >60 mL/min (ref >60)
Glucose, Bld: 141 mg/dL — ABNORMAL HIGH (ref 70–99)
Potassium: 3.3 mmol/L — ABNORMAL LOW (ref 3.5–5.1)
Sodium: 143 mmol/L (ref 135–145)

## 2019-08-05 LAB — GLUCOSE, CAPILLARY
Glucose-Capillary: 99 mg/dL (ref 70–99)
Glucose-Capillary: 97 mg/dL (ref 70–99)

## 2019-08-05 LAB — CBC
HCT: 41.1 % (ref 36.0–46.0)
Hemoglobin: 14.2 g/dL (ref 12.0–15.0)
MCH: 32.4 pg (ref 26.0–34.0)
MCHC: 34.5 g/dL (ref 30.0–36.0)
MCV: 93.8 fL (ref 80.0–100.0)
Platelets: 139 10*3/uL — ABNORMAL LOW (ref 150–400)
RBC: 4.38 MIL/uL (ref 3.87–5.11)
RDW: 12.8 % (ref 11.5–15.5)
WBC: 9.8 10*3/uL (ref 4.0–10.5)
nRBC: 0 % (ref 0.0–0.2)

## 2019-08-05 LAB — CK: Total CK: 844 U/L — ABNORMAL HIGH (ref 38–234)

## 2019-08-05 LAB — TROPONIN I (HIGH SENSITIVITY)
Troponin I (High Sensitivity): 366 ng/L (ref <18)
Troponin I (High Sensitivity): 603 ng/L (ref <18)
Troponin I (High Sensitivity): 609 ng/L (ref <18)
Troponin I (High Sensitivity): 515 ng/L (ref <18)

## 2019-08-05 LAB — HIV ANTIBODY (ROUTINE TESTING W REFLEX): HIV Screen 4th Generation wRfx: NONREACTIVE

## 2019-08-05 LAB — SARS CORONAVIRUS 2 BY RT PCR (HOSPITAL ORDER, PERFORMED IN ~~LOC~~ HOSPITAL LAB): SARS Coronavirus 2: NEGATIVE

## 2019-08-05 MED ORDER — ALPRAZOLAM 0.5 MG PO TABS
0.5000 mg | ORAL_TABLET | Freq: Every evening | ORAL | Status: DC | PRN
  Administered 2019-08-06 – 2019-08-08 (×3): 0.5 mg via ORAL
  Filled 2019-08-05 (×3): qty 1

## 2019-08-05 MED ORDER — SODIUM CHLORIDE 0.9% FLUSH: 3.0000 mL | Freq: Once | INTRAVENOUS | Status: DC

## 2019-08-05 MED ORDER — TRAMADOL HCL 50 MG PO TABS
50.0000 mg | ORAL_TABLET | Freq: Once | ORAL | Status: AC
  Administered 2019-08-05: 50 mg via ORAL
  Filled 2019-08-05: qty 1

## 2019-08-05 MED ORDER — CARBIDOPA-LEVODOPA ER 50-200 MG PO TBCR
1.0000 | EXTENDED_RELEASE_TABLET | Freq: Two times a day (BID) | ORAL | Status: DC
  Administered 2019-08-05 – 2019-08-09 (×9): 1 via ORAL
  Filled 2019-08-05 (×11): qty 1

## 2019-08-05 MED ORDER — ENOXAPARIN SODIUM 40 MG/0.4ML ~~LOC~~ SOLN
40.0000 mg | SUBCUTANEOUS | Status: DC
  Administered 2019-08-05 – 2019-08-09 (×5): 40 mg via SUBCUTANEOUS
  Filled 2019-08-05 (×5): qty 0.4

## 2019-08-05 MED ORDER — SIMVASTATIN 40 MG PO TABS
40.0000 mg | ORAL_TABLET | Freq: Every day | ORAL | Status: DC
  Administered 2019-08-05: 40 mg via ORAL
  Filled 2019-08-05 (×3): qty 1

## 2019-08-05 MED ORDER — PANTOPRAZOLE SODIUM 40 MG PO TBEC
40.0000 mg | DELAYED_RELEASE_TABLET | Freq: Every day | ORAL | Status: DC
  Administered 2019-08-05 – 2019-08-08 (×4): 40 mg via ORAL
  Filled 2019-08-05 (×5): qty 1

## 2019-08-05 MED ORDER — SODIUM CHLORIDE 0.9 % IV SOLN: INTRAVENOUS | Status: DC

## 2019-08-05 MED ORDER — ADULT MULTIVITAMIN W/MINERALS CH
1.0000 | ORAL_TABLET | Freq: Every day | ORAL | Status: DC
  Administered 2019-08-05 – 2019-08-08 (×4): 1 via ORAL
  Filled 2019-08-05 (×5): qty 1

## 2019-08-05 MED ORDER — MELATONIN 5 MG PO TABS
2.5000 mg | ORAL_TABLET | Freq: Every day | ORAL | Status: DC
  Administered 2019-08-05 – 2019-08-08 (×4): 2.5 mg via ORAL
  Filled 2019-08-05: qty 1
  Filled 2019-08-05: qty 0.5
  Filled 2019-08-05 (×2): qty 1

## 2019-08-05 MED ORDER — SERTRALINE HCL 50 MG PO TABS
50.0000 mg | ORAL_TABLET | Freq: Every day | ORAL | Status: DC
  Administered 2019-08-06 – 2019-08-08 (×3): 50 mg via ORAL
  Filled 2019-08-05 (×6): qty 1

## 2019-08-05 MED ORDER — CARBIDOPA-LEVODOPA 25-100 MG PO TABS: 2.0000 | ORAL_TABLET | Freq: Four times a day (QID) | ORAL | Status: DC

## 2019-08-05 MED ORDER — ONDANSETRON HCL 4 MG/2ML IJ SOLN
4.0000 mg | Freq: Four times a day (QID) | INTRAMUSCULAR | Status: DC | PRN
  Administered 2019-08-05 – 2019-08-09 (×3): 4 mg via INTRAVENOUS
  Filled 2019-08-05 (×2): qty 2

## 2019-08-05 MED ORDER — ALPRAZOLAM 0.5 MG PO TABS: 0.2500 mg | ORAL_TABLET | Freq: Two times a day (BID) | ORAL | Status: DC

## 2019-08-05 MED ORDER — DOCUSATE SODIUM 100 MG PO CAPS
100.0000 mg | ORAL_CAPSULE | Freq: Once | ORAL | Status: AC
  Administered 2019-08-05: 100 mg via ORAL
  Filled 2019-08-05: qty 1

## 2019-08-05 MED ORDER — ACETAMINOPHEN 325 MG PO TABS
650.0000 mg | ORAL_TABLET | Freq: Four times a day (QID) | ORAL | Status: DC | PRN
  Administered 2019-08-05 – 2019-08-08 (×5): 650 mg via ORAL
  Filled 2019-08-05 (×4): qty 2

## 2019-08-05 MED ORDER — POTASSIUM CHLORIDE CRYS ER 20 MEQ PO TBCR
40.0000 meq | EXTENDED_RELEASE_TABLET | Freq: Once | ORAL | Status: AC
  Administered 2019-08-05: 40 meq via ORAL
  Filled 2019-08-05: qty 2

## 2019-08-05 MED ORDER — CELECOXIB 200 MG PO CAPS
200.0000 mg | ORAL_CAPSULE | Freq: Every day | ORAL | Status: DC
  Administered 2019-08-05 – 2019-08-06 (×2): 200 mg via ORAL
  Filled 2019-08-05 (×2): qty 1

## 2019-08-05 MED ORDER — MIRTAZAPINE 15 MG PO TABS
7.5000 mg | ORAL_TABLET | Freq: Every day | ORAL | Status: DC
  Administered 2019-08-05 – 2019-08-08 (×4): 7.5 mg via ORAL
  Filled 2019-08-05 (×4): qty 1

## 2019-08-05 MED ORDER — LEVOTHYROXINE SODIUM 50 MCG PO TABS
175.0000 ug | ORAL_TABLET | Freq: Every day | ORAL | Status: DC
  Administered 2019-08-06 – 2019-08-09 (×4): 175 ug via ORAL
  Filled 2019-08-05: qty 1
  Filled 2019-08-05: qty 3.5
  Filled 2019-08-05 (×2): qty 1

## 2019-08-05 MED ORDER — ONDANSETRON HCL 4 MG/2ML IJ SOLN
INTRAMUSCULAR | Status: AC
  Filled 2019-08-05: qty 2

## 2019-08-05 MED ORDER — ONDANSETRON HCL 4 MG PO TABS: 4.0000 mg | ORAL_TABLET | Freq: Four times a day (QID) | ORAL | Status: DC | PRN

## 2019-08-05 MED ORDER — ALPRAZOLAM 0.25 MG PO TABS
0.2500 mg | ORAL_TABLET | Freq: Every morning | ORAL | Status: DC
  Administered 2019-08-05 – 2019-08-09 (×5): 0.25 mg via ORAL
  Filled 2019-08-05 (×5): qty 1

## 2019-08-05 MED ORDER — ROPINIROLE HCL 1 MG PO TABS
1.0000 mg | ORAL_TABLET | Freq: Two times a day (BID) | ORAL | Status: DC
  Administered 2019-08-05 – 2019-08-08 (×7): 1 mg via ORAL
  Filled 2019-08-05 (×9): qty 1

## 2019-08-05 MED ORDER — INSULIN ASPART 100 UNIT/ML ~~LOC~~ SOLN
0.0000 [IU] | Freq: Three times a day (TID) | SUBCUTANEOUS | Status: DC
  Administered 2019-08-06: 2 [IU] via SUBCUTANEOUS
  Filled 2019-08-05: qty 1

## 2019-08-05 MED ORDER — ROPINIROLE HCL ER 2 MG PO TB24: 2.0000 mg | ORAL_TABLET | Freq: Every day | ORAL | Status: DC

## 2019-08-05 MED ORDER — CITALOPRAM HYDROBROMIDE 20 MG PO TABS
40.0000 mg | ORAL_TABLET | Freq: Every day | ORAL | Status: DC
  Administered 2019-08-05 – 2019-08-08 (×4): 40 mg via ORAL
  Filled 2019-08-05 (×5): qty 2

## 2019-08-05 MED ORDER — CALCIUM CARBONATE-VITAMIN D 500-200 MG-UNIT PO TABS
1.0000 | ORAL_TABLET | Freq: Every day | ORAL | Status: DC
  Administered 2019-08-05 – 2019-08-08 (×4): 1 via ORAL
  Filled 2019-08-05 (×5): qty 1

## 2019-08-05 NOTE — ED Notes (Signed)
Pt done with lunch tray. Given other snacks and a water.

## 2019-08-05 NOTE — ED Notes (Signed)
Pt resting calmly in bed. Has purewick in place.

## 2019-08-05 NOTE — ED Triage Notes (Signed)
First nurse note- here for few falls over past few days.  Lives at home per EMS.  VSS with EMS

## 2019-08-05 NOTE — ED Triage Notes (Signed)
Pt presents to ED via [facility] with c/o increasing weakness, increasing fatigue, and increasing falls over the last few days. Pt c/o back pain, R knee pain, L arm pain, L thigh pain.

## 2019-08-05 NOTE — ED Provider Notes (Signed)
ER Provider Note       Time seen: 8:51 AM    I have reviewed the vital signs and the nursing notes.  HISTORY   Chief Complaint Weakness and Fall   HPI Theresa Mills is a 69 y.o. female with a history of anxiety, diabetes, GERD, hyperlipidemia, osteoarthritis, sleep apnea, thyroid disease who presents today for increasing weakness, fatigue and falls over the past several days.  Patient complains of back pain, knee pain, left arm pain and left thigh pain.  Discomfort is 8 out of 10 it is a generalized aching  Past Medical History:  Diagnosis Date  . Anxiety   . Diabetes ([place])   . Difficult intravenous access   . GERD (gastroesophageal reflux disease)   . HBP (high blood pressure)   . Hyperlipidemia   . Osteoarthritis   . Parkinson disease ([place])   . Sleep apnea   . Thyroid disease     Past Surgical History:  Procedure Laterality Date  . ABDOMINAL HYSTERECTOMY    . CATARACT EXTRACTION Bilateral   . CHOLECYSTECTOMY    . COLONOSCOPY WITH PROPOFOL N/A 04/02/2015   Procedure: COLONOSCOPY WITH PROPOFOL;  Surgeon: Theresa Class, MD;  Location: [facility] ENDOSCOPY;  Service: Endoscopy;  Laterality: N/A;  . ESOPHAGOGASTRODUODENOSCOPY (EGD) WITH PROPOFOL N/A 04/02/2015   Procedure: ESOPHAGOGASTRODUODENOSCOPY (EGD) WITH PROPOFOL;  Surgeon: Theresa Class, MD;  Location: [facility] ENDOSCOPY;  Service: Endoscopy;  Laterality: N/A;  . GALLBLADDER SURGERY    . LAPAROSCOPIC HYSTERECTOMY    . THYROIDECTOMY Left   . TONSILLECTOMY    . TONSILLECTOMY Bilateral     Allergies Codeine, Ephadrene [cholestatin], Other, and Valium [diazepam]  Review of Systems Constitutional: Negative for fever. Cardiovascular: Negative for chest pain. Respiratory: Negative for shortness of breath. Gastrointestinal: Negative for abdominal pain, vomiting and diarrhea. Musculoskeletal: Positive for musculoskeletal pain Skin: Negative for rash. Neurological: Negative for headaches, positive for weakness,  frequent falls  All systems negative/normal/unremarkable except as stated in the HPI  ____________________________________________   PHYSICAL EXAM:  VITAL SIGNS: Vitals:   08/05/19 0722  BP: 134/67  Pulse: 68  Resp: 18  Temp: 98.9 F (37.2 C)  SpO2: 96%    Constitutional: Alert and oriented.  Chronically ill-appearing, no acute distress Eyes: Conjunctivae are normal. Normal extraocular movements. ENT      Head: Normocephalic and atraumatic.      Nose: No congestion/rhinnorhea.      Mouth/Throat: Mucous membranes are moist.      Neck: No stridor. Cardiovascular: Normal rate, regular rhythm. No murmurs, rubs, or gallops. Respiratory: Normal respiratory effort without tachypnea nor retractions. Breath sounds are clear and equal bilaterally. No wheezes/rales/rhonchi. Gastrointestinal: Soft and nontender. Normal bowel sounds Musculoskeletal: Nontender with normal range of motion in extremities. No lower extremity tenderness nor edema. Neurologic:  Normal speech and language. No gross focal neurologic deficits are appreciated.  Diminished weakness throughout Skin: Bruising of the toes of the left foot, particularly over the dorsum, also plantar bruising on the right foot Psychiatric: Speech and behavior are normal.  ____________________________________________  EKG: Interpreted by me.  Sinus rhythm with rate of 69 bpm, normal PR interval, normal QRS, normal QT  ____________________________________________   LABS (pertinent positives/negatives)  Labs Reviewed  BASIC METABOLIC PANEL - Abnormal; Notable for the following components:      Result Value   Potassium 3.3 (*)    Glucose, Bld 141 (*)    BUN 28 (*)    All other components within normal limits  CBC -  Abnormal; Notable for the following components:   Platelets 139 (*)    All other components within normal limits  TROPONIN I (HIGH SENSITIVITY) - Abnormal; Notable for the following components:   Troponin I (High  Sensitivity) 366 (*)    All other components within normal limits  URINALYSIS, COMPLETE (UACMP) WITH MICROSCOPIC  CK  CBG MONITORING, ED  TROPONIN I (HIGH SENSITIVITY)    RADIOLOGY  Images were viewed by me CT head, C-spine, right knee x-ray, left foot x-ray, pelvis x-ray IMPRESSION: CT head: Atrophy with slight periventricular small vessel disease. No acute infarct. No mass or hemorrhage.  There are foci of arterial vascular calcification. There are foci of paranasal sinus disease. There is nasal septal deviation.  CT cervical spine: No fracture or spondylolisthesis. Osteoarthritic change noted at multiple levels. No disc extrusion or stenosis. There is left carotid artery calcification. IMPRESSION: Mildly displaced fourth proximal phalangeal fracture. IMPRESSION: Severe degenerative joint disease. No acute abnormality seen in the right knee. IMPRESSION: Mild to moderate degenerative joint disease of both hip joints. No acute abnormality seen in the pelvis.   DIFFERENTIAL DIAGNOSIS  Musculoskeletal pain, contusion, fracture, dehydration, electrolyte abnormality, occult infection, MI, CVA  ASSESSMENT AND PLAN  Weakness, frequent falls, elevated troponin, phalangeal fracture   Plan: The patient had presented for weakness and frequent falls. Patient's labs were unremarkable with the exception of an elevated troponin of 366.  CT of the head and C-spine was unremarkable, in general she has extensive changes of osteoarthritis throughout her spine and in her joints.  She is unsafe for ambulation and needs extensive physical therapy.  Troponin is elevated of uncertain etiology, possibly demand related.  I will discuss with the hospitalist for admission and social work placement for skilled nursing facility.  Theresa Arena MD    Note: This note was generated in part or whole with voice recognition software. Voice recognition is usually quite accurate but there are  transcription errors that can and very often do occur. I apologize for any typographical errors that were not detected and corrected.     Theresa Newport, MD 08/05/19 1037

## 2019-08-05 NOTE — ED Notes (Signed)
Pt repositioned and given 2nd pillow. Bruising to L knee; pt reports from fall. Pt reports still uncomfortable but denies that anything else would help. Offered ice for knee; declined.

## 2019-08-05 NOTE — H&P (Signed)
History and Physical    Theresa Mills PJA:[id number] DOB: [birth year] DOA: 08/05/2019  PCP: Ronnell Freshwater, NP   Patient coming from: Home  I have personally briefly reviewed patient's old medical records in [place]  Chief Complaint: " I hurt all over"  HPI: Theresa Mills is a 69 y.o. female with medical history significant for diabetes mellitus, GERD, anxiety, sleep apnea, Parkinson's disease and hypothyroidism by EMS generalized weakness, joint pain, fatigue and multiple falls at home.  Patient normally ambulates with a walker and states that all the time she had fallen at home she had set the walker to decide to do something and then she fell.  She denies feeling dizzy or lightheaded prior to the fall.  She denies having any abdominal pain, no nausea, no vomiting, no changes in her bowel habits, no fever, no chills, no cough. Labs revealed total CK of 844, initial troponin of 366 and repeat of 603, mild hypokalemia 3.4. CT scan of head and neck shows no acute findings Left foot x-ray shows mildly displaced fourth proximal phalangeal fracture. Left hip x ray showed mild to moderate degenerative joint disease of both hip joints. Right knee x-ray showed  severe degenerative joint disease. No acute abnormality seen in the right knee. Twelve-lead EKG reviewed by me shows normal sinus rhythm  ED Course: Patient is a 69 year old Caucasian female with a history significant for Parkinson's disease, diabetes mellitus, hypothyroidism who was brought in by EMS for evaluation of generalized weakness, frequent falls and diffuse joint pain.  Patient lives alone and has had multiple falls at home.  She has an elevated total CK as well as troponin levels and benefits observation status.  Review of Systems: As per HPI otherwise 10 point review of systems negative.  Killing me if apple   Past Medical History:  Diagnosis Date  . Anxiety   . Diabetes ([place])   . Difficult intravenous access     . GERD (gastroesophageal reflux disease)   . HBP (high blood pressure)   . Hyperlipidemia   . Osteoarthritis   . Parkinson disease ([place])   . Sleep apnea   . Thyroid disease     Past Surgical History:  Procedure Laterality Date  . ABDOMINAL HYSTERECTOMY    . CATARACT EXTRACTION Bilateral   . CHOLECYSTECTOMY    . COLONOSCOPY WITH PROPOFOL N/A 04/02/2015   Procedure: COLONOSCOPY WITH PROPOFOL;  Surgeon: Josefine Class, MD;  Location: [facility] ENDOSCOPY;  Service: Endoscopy;  Laterality: N/A;  . ESOPHAGOGASTRODUODENOSCOPY (EGD) WITH PROPOFOL N/A 04/02/2015   Procedure: ESOPHAGOGASTRODUODENOSCOPY (EGD) WITH PROPOFOL;  Surgeon: Josefine Class, MD;  Location: [facility] ENDOSCOPY;  Service: Endoscopy;  Laterality: N/A;  . GALLBLADDER SURGERY    . LAPAROSCOPIC HYSTERECTOMY    . THYROIDECTOMY Left   . TONSILLECTOMY    . TONSILLECTOMY Bilateral      reports that she has never smoked. She has never used smokeless tobacco. She reports that she does not drink alcohol and does not use drugs.  Allergies  Allergen Reactions  . Codeine Other (See Comments)    HALLUCINATIONS  . Ephadrene [Cholestatin] Other (See Comments)    HYPER AND NERVOUS  . Other Other (See Comments)    HYPER  HYPER AND NERVOUS  . Valium [Diazepam] Other (See Comments)    OVERLY SENSITIVE/TO STRONG    Family History  Problem Relation Age of Onset  . Asthma Father   . Cancer Sister   . Stroke Maternal Uncle   .  Heart disease Maternal Uncle   . Diabetes Maternal Uncle      Prior to Admission medications   Medication Sig Start Date End Date Taking? Authorizing Provider  ALPRAZolam (XANAX) 0.5 MG tablet TAKE ONE-HALF (1/2) TABLET BY MOUTH EVERY MORNING AND TAKE ONE TABLET AT BEDTIME AS NEEDED Patient taking differently: Take 0.25-0.5 mg by mouth 2 (two) times daily. TAKE ONE-HALF (1/2) TABLET BY MOUTH EVERY MORNING AND TAKE ONE TABLET AT BEDTIME AS NEEDED 05/28/19  Yes Boscia, Heather E, NP  Calcium  Carbonate-Vitamin D (CALCIUM HIGH POTENCY/VITAMIN D) 600-200 MG-UNIT TABS Take 1 tablet by mouth daily.    Yes [provider]  carbidopa-levodopa (SINEMET CR) 50-200 MG tablet Take 1 tablet by mouth 2 (two) times daily.  07/31/18  Yes [provider]  carbidopa-levodopa (SINEMET IR) 25-100 MG tablet Take 2 tablets by mouth 4 (four) times daily. 07/25/19  Yes [provider]  celecoxib (CELEBREX) 200 MG capsule Take 1 capsule (200 mg total) by mouth 2 (two) times daily as needed. 07/29/19  Yes Boscia, Greer Ee, NP  citalopram (CELEXA) 40 MG tablet Take 40 mg by mouth daily.   Yes [provider]  glucose blood (ONETOUCH VERIO) test strip TEST BLOOD SUGAR BID 01/27/16  Yes [provider]  levothyroxine (SYNTHROID) 175 MCG tablet Take 1 tablet (175 mcg total) by mouth daily. 02/10/19  Yes Ronnell Freshwater, NP  Melatonin 3 MG TABS Take by mouth.   Yes [provider]  metFORMIN (GLUCOPHAGE) 500 MG tablet Take 500 mg by mouth 2 (two) times daily with a meal.    Yes [provider]  mirtazapine (REMERON) 7.5 MG tablet Take 1 tablet (7.5 mg total) by mouth at bedtime. 02/10/19  Yes Boscia, Greer Ee, NP  Multiple Vitamins-Minerals (MULTIVITAMIN WOMEN 50+ PO) Take 1 tablet by mouth daily.    Yes [provider]  omeprazole (PRILOSEC) 40 MG capsule Take 1 capsule (40 mg total) by mouth daily. 06/12/19  Yes Boscia, Greer Ee, NP  ondansetron (ZOFRAN-ODT) 4 MG disintegrating tablet Take 1 tablet (4 mg total) by mouth every 8 (eight) hours as needed for nausea or vomiting. 06/05/19  Yes Boscia, Greer Ee, NP  ONETOUCH DELICA LANCETS 99I MISC TEST BLOOD SUGAR BID 01/27/16  Yes [provider]  rOPINIRole (REQUIP XL) 2 MG 24 hr tablet Take 2 mg by mouth at bedtime.    Yes [provider]  sertraline (ZOLOFT) 50 MG tablet Take 1 tablet (50 mg total) by mouth daily. 02/10/19  Yes Boscia, Greer Ee, NP  simvastatin (ZOCOR) 40 MG tablet  Take 1 tablet (40 mg total) by mouth at bedtime. 04/10/19  Yes Ronnell Freshwater, NP    Physical Exam: Vitals:   08/05/19 1100 08/05/19 1116 08/05/19 1218 08/05/19 1221  BP: (!) 143/75   129/69  Pulse: (!) 55 61 62 65  Resp: 19 15 17 15   Temp:      TempSrc:      SpO2: 98% 100% 97% 97%  Weight:      Height:         Vitals:   08/05/19 1100 08/05/19 1116 08/05/19 1218 08/05/19 1221  BP: (!) 143/75   129/69  Pulse: (!) 55 61 62 65  Resp: 19 15 17 15   Temp:      TempSrc:      SpO2: 98% 100% 97% 97%  Weight:      Height:        Constitutional: NAD, alert and  oriented x 3.  Chronically ill-appearing flat affect Eyes: PERRL, lids and conjunctivae pallor ENMT: Mucous membranes are moist.  Neck: normal, supple, no masses, no thyromegaly Respiratory: clear to auscultation bilaterally, no wheezing, no crackles. Normal respiratory effort. No accessory muscle use.  Cardiovascular: Regular rate and rhythm, no murmurs / rubs / gallops. No extremity edema. 2+ pedal pulses. No carotid bruits.  Abdomen: no tenderness, no masses palpated. No hepatosplenomegaly. Bowel sounds positive.  Central adiposity Musculoskeletal: no clubbing / cyanosis. No joint deformity upper and lower extremities.  Skin: no rashes, lesions, ulcers.  Neurologic: No gross focal neurologic deficit.  Generalized weakness Psychiatric: Flat affect.   Labs on Admission: I have personally reviewed following labs and imaging studies  CBC: Recent Labs  Lab 08/05/19 0732  WBC 9.8  HGB 14.2  HCT 41.1  MCV 93.8  PLT 469*   Basic Metabolic Panel: Recent Labs  Lab 08/05/19 0732  NA 143  K 3.3*  CL 109  CO2 24  GLUCOSE 141*  BUN 28*  CREATININE 0.55  CALCIUM 9.0   GFR: Estimated Creatinine Clearance: 68.1 mL/min (by C-G formula based on SCr of 0.55 mg/dL). Liver Function Tests: No results for input(s): AST, ALT, ALKPHOS, BILITOT, PROT, ALBUMIN in the last 168 hours. No results for input(s): LIPASE, AMYLASE  in the last 168 hours. No results for input(s): AMMONIA in the last 168 hours. Coagulation Profile: No results for input(s): INR, PROTIME in the last 168 hours. Cardiac Enzymes: Recent Labs  Lab 08/05/19 1054  CKTOTAL 844*   BNP (last 3 results) No results for input(s): PROBNP in the last 8760 hours. HbA1C: No results for input(s): HGBA1C in the last 72 hours. CBG: No results for input(s): GLUCAP in the last 168 hours. Lipid Profile: No results for input(s): CHOL, HDL, LDLCALC, TRIG, CHOLHDL, LDLDIRECT in the last 72 hours. Thyroid Function Tests: No results for input(s): TSH, T4TOTAL, FREET4, T3FREE, THYROIDAB in the last 72 hours. Anemia Panel: No results for input(s): VITAMINB12, FOLATE, FERRITIN, TIBC, IRON, RETICCTPCT in the last 72 hours. Urine analysis:    Component Value Date/Time   COLORURINE YELLOW (A) 06/11/2017 1157   APPEARANCEUR Cloudy (A) 09/05/2017 1535   LABSPEC 1.028 06/11/2017 1157   LABSPEC 1.021 01/13/2014 0737   PHURINE 6.0 06/11/2017 1157   GLUCOSEU 3+ (A) 09/05/2017 1535   GLUCOSEU >=500 01/13/2014 0737   HGBUR SMALL (A) 06/11/2017 1157   BILIRUBINUR negative 06/20/2019 1144   BILIRUBINUR Negative 09/05/2017 1535   BILIRUBINUR Negative 01/13/2014 0737   KETONESUR 20 (A) 06/11/2017 1157   PROTEINUR Positive (A) 06/20/2019 1144   PROTEINUR Negative 09/05/2017 1535   PROTEINUR 30 (A) 06/11/2017 1157   UROBILINOGEN negative (A) 06/20/2019 1144   NITRITE negative 06/20/2019 1144   NITRITE Positive (A) 09/05/2017 1535   NITRITE NEGATIVE 06/11/2017 1157   LEUKOCYTESUR Trace (A) 06/20/2019 1144   LEUKOCYTESUR Trace (A) 09/05/2017 1535   LEUKOCYTESUR 3+ 01/13/2014 0737    Radiological Exams on Admission: DG Pelvis 1-2 Views  Result Date: 08/05/2019 CLINICAL DATA:  Left hip pain after multiple falls. EXAM: PELVIS - 1-2 VIEW COMPARISON:  Jun 10, 2017. FINDINGS: There is no evidence of pelvic fracture or diastasis. No pelvic bone lesions are seen.  Mild-to-moderate degenerative changes seen involving both hip joints. IMPRESSION: Mild to moderate degenerative joint disease of both hip joints. No acute abnormality seen in the pelvis. Electronically Signed   By: Marijo Conception M.D.   On: 08/05/2019 09:54   CT Head Wo  Contrast  Result Date: 08/05/2019 CLINICAL DATA:  Several recent falls EXAM: CT HEAD WITHOUT CONTRAST CT CERVICAL SPINE WITHOUT CONTRAST TECHNIQUE: Multidetector CT imaging of the head and cervical spine was performed following the standard protocol without intravenous contrast. Multiplanar CT image reconstructions of the cervical spine were also generated. COMPARISON:  CT head and CT cervical spine Jun 10, 2017 FINDINGS: CT HEAD FINDINGS Brain: Moderate diffuse atrophy is stable. There is no intracranial mass, hemorrhage, extra-axial fluid collection, or midline shift. There is rather minimal small vessel disease in the centra semiovale bilaterally. No evident acute infarct. Vascular: No hyperdense vessel. There are foci of calcification in each carotid siphon. Skull: The bony calvarium appears intact. Sinuses/Orbits: There is an apparent retention cyst in the inferior left maxillary antrum. There is mucosal thickening in several ethmoid air cells. Other visualized paranasal sinuses are clear. Orbits appear symmetric bilaterally. There is rightward nasal septal deviation. Other: Mastoid air cells are clear. CT CERVICAL SPINE FINDINGS Alignment: There is no spondylolisthesis. Skull base and vertebrae: The skull base and craniocervical junction regions appear normal. There is no demonstrable fracture. No blastic or lytic bone lesions. Soft tissues and spinal canal: Prevertebral soft tissues are normal. There is narrowing of the predental space with osteoarthritic change in this area. There is no evident cord or canal hematoma. No paraspinous lesions are evident. Disc levels: There is mild disc space narrowing at C6-7 and C7-T1. Other disc spaces  appear unremarkable. There is facet hypertrophy at multiple levels. No disc extrusion or stenosis evident. Upper chest: 6 visualized upper lung regions are clear. Other: There are foci of calcification in left carotid artery. IMPRESSION: CT head: Atrophy with slight periventricular small vessel disease. No acute infarct. No mass or hemorrhage. There are foci of arterial vascular calcification. There are foci of paranasal sinus disease. There is nasal septal deviation. CT cervical spine: No fracture or spondylolisthesis. Osteoarthritic change noted at multiple levels. No disc extrusion or stenosis. There is left carotid artery calcification. Electronically Signed   By: Lowella Grip III M.D.   On: 08/05/2019 10:29   CT Cervical Spine Wo Contrast  Result Date: 08/05/2019 CLINICAL DATA:  Several recent falls EXAM: CT HEAD WITHOUT CONTRAST CT CERVICAL SPINE WITHOUT CONTRAST TECHNIQUE: Multidetector CT imaging of the head and cervical spine was performed following the standard protocol without intravenous contrast. Multiplanar CT image reconstructions of the cervical spine were also generated. COMPARISON:  CT head and CT cervical spine Jun 10, 2017 FINDINGS: CT HEAD FINDINGS Brain: Moderate diffuse atrophy is stable. There is no intracranial mass, hemorrhage, extra-axial fluid collection, or midline shift. There is rather minimal small vessel disease in the centra semiovale bilaterally. No evident acute infarct. Vascular: No hyperdense vessel. There are foci of calcification in each carotid siphon. Skull: The bony calvarium appears intact. Sinuses/Orbits: There is an apparent retention cyst in the inferior left maxillary antrum. There is mucosal thickening in several ethmoid air cells. Other visualized paranasal sinuses are clear. Orbits appear symmetric bilaterally. There is rightward nasal septal deviation. Other: Mastoid air cells are clear. CT CERVICAL SPINE FINDINGS Alignment: There is no spondylolisthesis.  Skull base and vertebrae: The skull base and craniocervical junction regions appear normal. There is no demonstrable fracture. No blastic or lytic bone lesions. Soft tissues and spinal canal: Prevertebral soft tissues are normal. There is narrowing of the predental space with osteoarthritic change in this area. There is no evident cord or canal hematoma. No paraspinous lesions are evident. Disc levels: There is  mild disc space narrowing at C6-7 and C7-T1. Other disc spaces appear unremarkable. There is facet hypertrophy at multiple levels. No disc extrusion or stenosis evident. Upper chest: 6 visualized upper lung regions are clear. Other: There are foci of calcification in left carotid artery. IMPRESSION: CT head: Atrophy with slight periventricular small vessel disease. No acute infarct. No mass or hemorrhage. There are foci of arterial vascular calcification. There are foci of paranasal sinus disease. There is nasal septal deviation. CT cervical spine: No fracture or spondylolisthesis. Osteoarthritic change noted at multiple levels. No disc extrusion or stenosis. There is left carotid artery calcification. Electronically Signed   By: Lowella Grip III M.D.   On: 08/05/2019 10:29   DG Knee Complete 4 Views Right  Result Date: 08/05/2019 CLINICAL DATA:  Right knee pain after fall. EXAM: RIGHT KNEE - COMPLETE 4+ VIEW COMPARISON:  Jun 10, 2017. FINDINGS: No evidence of fracture, dislocation, or joint effusion. Severe narrowing of lateral joint space is noted with osteophyte formation. Moderate narrowing of patellofemoral space is noted with osteophyte formation. Soft tissues are unremarkable. IMPRESSION: Severe degenerative joint disease. No acute abnormality seen in the right knee. Electronically Signed   By: Marijo Conception M.D.   On: 08/05/2019 09:49   DG Foot Complete Left  Result Date: 08/05/2019 CLINICAL DATA:  Acute left foot pain after multiple falls. EXAM: LEFT FOOT - COMPLETE 3+ VIEW COMPARISON:   April 05, 2015. FINDINGS: Mildly displaced fracture is seen involving the proximal base of the fourth proximal phalanx. Mild posterior calcaneal spurring is noted. Mild hallux valgus deformity is seen involving the first metatarsophalangeal joint. IMPRESSION: Mildly displaced fourth proximal phalangeal fracture. Electronically Signed   By: Marijo Conception M.D.   On: 08/05/2019 09:52    EKG: Independently reviewed.  Normal sinus rhythm   Assessment/Plan Principal Problem:   Rhabdomyolysis Active Problems:   Fall   Anxiety, generalized   Dementia due to Parkinson's disease without behavioral disturbance ([place])   Depression   Diabetes mellitus type 2, uncomplicated ([place])   Parkinson disease ([place])   Acquired hypothyroidism   Frequent falls   Rhabdomyolysis Patient is s/p multiple falls at home and presents to the ER for evaluation of generalized weakness and diffuse pain She has elevated total CK levels We will hydrate patient Repeat CK levels in a.m.   Frequent falls Patient with a history of hypertension presents with presents for evaluation of generalized pain and weakness She has had multiple falls at home Will request PT evaluation Place patient on fall precautions   Anxiety disorder Continue alprazolam   Parkinson's disease Continue Sinemet and Ropinirole   Depression Stable Continue Celexa, Remeron and Zoloft   Diabetes mellitus Hold Metformin while patient is hospitalized Place patient on sliding scale coverage with insulin Maintain consistent carbohydrate diet   Hypokalemia Supplement potassium   Hypothyroidism Continue Synthroid  DVT prophylaxis: Lovenox Code Status: Full code Family Communication: Greater than 50% of time was spent discussing plan of care with patient at the bedside.  All questions and concerns have been addressed.  She verbalizes understanding and agrees with the plan.   Disposition Plan: Back to previous home  environment Consults called: Physical therapy    Lonita Debes MD [facility]     08/05/2019, 12:27 PM

## 2019-08-05 NOTE — ED Notes (Signed)
Trop called from lab 366.  Agricultural consultant notified.

## 2019-08-05 NOTE — ED Notes (Signed)
Pt eating from lunch tray.  

## 2019-08-05 NOTE — ED Notes (Signed)
Pt encouraged to attempt to use purewick. States after she was repositioned earlier she hasn't had the urge yet again.

## 2019-08-06 ENCOUNTER — Inpatient Hospital Stay: Payer: Medicare Other

## 2019-08-06 DIAGNOSIS — Z9049 Acquired absence of other specified parts of digestive tract: Secondary | ICD-10-CM | POA: Diagnosis not present

## 2019-08-06 DIAGNOSIS — M479 Spondylosis, unspecified: Secondary | ICD-10-CM | POA: Diagnosis present

## 2019-08-06 DIAGNOSIS — F411 Generalized anxiety disorder: Secondary | ICD-10-CM | POA: Diagnosis present

## 2019-08-06 DIAGNOSIS — W19XXXD Unspecified fall, subsequent encounter: Secondary | ICD-10-CM | POA: Diagnosis not present

## 2019-08-06 DIAGNOSIS — Z833 Family history of diabetes mellitus: Secondary | ICD-10-CM | POA: Diagnosis not present

## 2019-08-06 DIAGNOSIS — R0902 Hypoxemia: Secondary | ICD-10-CM | POA: Diagnosis not present

## 2019-08-06 DIAGNOSIS — M1389 Other specified arthritis, multiple sites: Secondary | ICD-10-CM | POA: Diagnosis not present

## 2019-08-06 DIAGNOSIS — R531 Weakness: Secondary | ICD-10-CM | POA: Diagnosis not present

## 2019-08-06 DIAGNOSIS — W19XXXA Unspecified fall, initial encounter: Secondary | ICD-10-CM | POA: Diagnosis not present

## 2019-08-06 DIAGNOSIS — E1165 Type 2 diabetes mellitus with hyperglycemia: Secondary | ICD-10-CM | POA: Diagnosis not present

## 2019-08-06 DIAGNOSIS — F29 Unspecified psychosis not due to a substance or known physiological condition: Secondary | ICD-10-CM | POA: Diagnosis not present

## 2019-08-06 DIAGNOSIS — E876 Hypokalemia: Secondary | ICD-10-CM | POA: Diagnosis present

## 2019-08-06 DIAGNOSIS — G4730 Sleep apnea, unspecified: Secondary | ICD-10-CM | POA: Diagnosis present

## 2019-08-06 DIAGNOSIS — M170 Bilateral primary osteoarthritis of knee: Secondary | ICD-10-CM | POA: Diagnosis present

## 2019-08-06 DIAGNOSIS — Z8249 Family history of ischemic heart disease and other diseases of the circulatory system: Secondary | ICD-10-CM | POA: Diagnosis not present

## 2019-08-06 DIAGNOSIS — R5381 Other malaise: Secondary | ICD-10-CM | POA: Diagnosis not present

## 2019-08-06 DIAGNOSIS — R296 Repeated falls: Secondary | ICD-10-CM

## 2019-08-06 DIAGNOSIS — M6281 Muscle weakness (generalized): Secondary | ICD-10-CM | POA: Diagnosis not present

## 2019-08-06 DIAGNOSIS — Z7401 Bed confinement status: Secondary | ICD-10-CM | POA: Diagnosis not present

## 2019-08-06 DIAGNOSIS — M6282 Rhabdomyolysis: Secondary | ICD-10-CM | POA: Diagnosis not present

## 2019-08-06 DIAGNOSIS — M8949 Other hypertrophic osteoarthropathy, multiple sites: Secondary | ICD-10-CM | POA: Diagnosis not present

## 2019-08-06 DIAGNOSIS — R262 Difficulty in walking, not elsewhere classified: Secondary | ICD-10-CM | POA: Diagnosis not present

## 2019-08-06 DIAGNOSIS — Z885 Allergy status to narcotic agent status: Secondary | ICD-10-CM | POA: Diagnosis not present

## 2019-08-06 DIAGNOSIS — Z9181 History of falling: Secondary | ICD-10-CM | POA: Diagnosis not present

## 2019-08-06 DIAGNOSIS — R2681 Unsteadiness on feet: Secondary | ICD-10-CM | POA: Diagnosis not present

## 2019-08-06 DIAGNOSIS — E119 Type 2 diabetes mellitus without complications: Secondary | ICD-10-CM

## 2019-08-06 DIAGNOSIS — Z20822 Contact with and (suspected) exposure to covid-19: Secondary | ICD-10-CM | POA: Diagnosis present

## 2019-08-06 DIAGNOSIS — F339 Major depressive disorder, recurrent, unspecified: Secondary | ICD-10-CM | POA: Diagnosis not present

## 2019-08-06 DIAGNOSIS — R41841 Cognitive communication deficit: Secondary | ICD-10-CM | POA: Diagnosis not present

## 2019-08-06 DIAGNOSIS — G20 Parkinson's disease: Secondary | ICD-10-CM | POA: Diagnosis present

## 2019-08-06 DIAGNOSIS — Z7984 Long term (current) use of oral hypoglycemic drugs: Secondary | ICD-10-CM | POA: Diagnosis not present

## 2019-08-06 DIAGNOSIS — K219 Gastro-esophageal reflux disease without esophagitis: Secondary | ICD-10-CM | POA: Diagnosis present

## 2019-08-06 DIAGNOSIS — F0280 Dementia in other diseases classified elsewhere without behavioral disturbance: Secondary | ICD-10-CM | POA: Diagnosis present

## 2019-08-06 DIAGNOSIS — G2581 Restless legs syndrome: Secondary | ICD-10-CM | POA: Diagnosis not present

## 2019-08-06 DIAGNOSIS — S92512D Displaced fracture of proximal phalanx of left lesser toe(s), subsequent encounter for fracture with routine healing: Secondary | ICD-10-CM | POA: Diagnosis not present

## 2019-08-06 DIAGNOSIS — Y92009 Unspecified place in unspecified non-institutional (private) residence as the place of occurrence of the external cause: Secondary | ICD-10-CM | POA: Diagnosis not present

## 2019-08-06 DIAGNOSIS — E860 Dehydration: Secondary | ICD-10-CM | POA: Diagnosis present

## 2019-08-06 DIAGNOSIS — T796XXA Traumatic ischemia of muscle, initial encounter: Secondary | ICD-10-CM | POA: Diagnosis present

## 2019-08-06 DIAGNOSIS — H259 Unspecified age-related cataract: Secondary | ICD-10-CM | POA: Diagnosis not present

## 2019-08-06 DIAGNOSIS — M2550 Pain in unspecified joint: Secondary | ICD-10-CM | POA: Diagnosis not present

## 2019-08-06 DIAGNOSIS — I10 Essential (primary) hypertension: Secondary | ICD-10-CM | POA: Diagnosis present

## 2019-08-06 DIAGNOSIS — F419 Anxiety disorder, unspecified: Secondary | ICD-10-CM | POA: Diagnosis not present

## 2019-08-06 DIAGNOSIS — E785 Hyperlipidemia, unspecified: Secondary | ICD-10-CM | POA: Diagnosis present

## 2019-08-06 DIAGNOSIS — Z90710 Acquired absence of both cervix and uterus: Secondary | ICD-10-CM | POA: Diagnosis not present

## 2019-08-06 DIAGNOSIS — I214 Non-ST elevation (NSTEMI) myocardial infarction: Secondary | ICD-10-CM | POA: Diagnosis not present

## 2019-08-06 DIAGNOSIS — E039 Hypothyroidism, unspecified: Secondary | ICD-10-CM | POA: Diagnosis present

## 2019-08-06 DIAGNOSIS — F329 Major depressive disorder, single episode, unspecified: Secondary | ICD-10-CM | POA: Diagnosis present

## 2019-08-06 DIAGNOSIS — Z888 Allergy status to other drugs, medicaments and biological substances status: Secondary | ICD-10-CM | POA: Diagnosis not present

## 2019-08-06 LAB — CBC
HCT: 36.8 % (ref 36.0–46.0)
Hemoglobin: 12.4 g/dL (ref 12.0–15.0)
MCH: 32.5 pg (ref 26.0–34.0)
MCHC: 33.7 g/dL (ref 30.0–36.0)
MCV: 96.6 fL (ref 80.0–100.0)
Platelets: 122 10*3/uL — ABNORMAL LOW (ref 150–400)
RBC: 3.81 MIL/uL — ABNORMAL LOW (ref 3.87–5.11)
RDW: 12.9 % (ref 11.5–15.5)
WBC: 7.7 10*3/uL (ref 4.0–10.5)
nRBC: 0 % (ref 0.0–0.2)

## 2019-08-06 LAB — BASIC METABOLIC PANEL
Anion gap: 5 (ref 5–15)
BUN: 31 mg/dL — ABNORMAL HIGH (ref 8–23)
CO2: 27 mmol/L (ref 22–32)
Calcium: 8.2 mg/dL — ABNORMAL LOW (ref 8.9–10.3)
Chloride: 113 mmol/L — ABNORMAL HIGH (ref 98–111)
Creatinine, Ser: 0.69 mg/dL (ref 0.44–1.00)
GFR calc Af Amer: >60 mL/min (ref >60)
GFR calc non Af Amer: >60 mL/min (ref >60)
Glucose, Bld: 103 mg/dL — ABNORMAL HIGH (ref 70–99)
Potassium: 3.9 mmol/L (ref 3.5–5.1)
Sodium: 145 mmol/L (ref 135–145)

## 2019-08-06 LAB — URINALYSIS, COMPLETE (UACMP) WITH MICROSCOPIC
Bacteria, UA: NONE SEEN
Bilirubin Urine: NEGATIVE
Glucose, UA: NEGATIVE mg/dL
Hgb urine dipstick: NEGATIVE
Ketones, ur: 5 mg/dL — AB
Leukocytes,Ua: NEGATIVE
Nitrite: NEGATIVE
Protein, ur: NEGATIVE mg/dL
Specific Gravity, Urine: 1.03 (ref 1.005–1.030)
Squamous Epithelial / HPF: NONE SEEN (ref 0–5)
pH: 5 (ref 5.0–8.0)

## 2019-08-06 LAB — CK: Total CK: 283 U/L — ABNORMAL HIGH (ref 38–234)

## 2019-08-06 LAB — GLUCOSE, CAPILLARY
Glucose-Capillary: 92 mg/dL (ref 70–99)
Glucose-Capillary: 134 mg/dL — ABNORMAL HIGH (ref 70–99)
Glucose-Capillary: 82 mg/dL (ref 70–99)
Glucose-Capillary: 105 mg/dL — ABNORMAL HIGH (ref 70–99)

## 2019-08-06 LAB — HEMOGLOBIN A1C
Hgb A1c MFr Bld: 5.5 % (ref 4.8–5.6)
Mean Plasma Glucose: 111 mg/dL

## 2019-08-06 MED ORDER — METFORMIN HCL 500 MG PO TABS
500.0000 mg | ORAL_TABLET | Freq: Two times a day (BID) | ORAL | Status: DC
  Administered 2019-08-06 – 2019-08-09 (×6): 500 mg via ORAL
  Filled 2019-08-06 (×7): qty 1

## 2019-08-06 MED ORDER — CARBIDOPA-LEVODOPA ER 25-100 MG PO TBCR
1.0000 | EXTENDED_RELEASE_TABLET | Freq: Four times a day (QID) | ORAL | Status: DC
  Administered 2019-08-06 – 2019-08-08 (×7): 1 via ORAL
  Filled 2019-08-06 (×11): qty 1

## 2019-08-06 MED ORDER — CELECOXIB 200 MG PO CAPS
200.0000 mg | ORAL_CAPSULE | Freq: Two times a day (BID) | ORAL | Status: DC | PRN
  Administered 2019-08-06 – 2019-08-08 (×4): 200 mg via ORAL
  Filled 2019-08-06 (×8): qty 1

## 2019-08-06 NOTE — Evaluation (Signed)
Physical Therapy Evaluation Patient Details Name: Theresa Mills MRN: [id number] DOB: 02-18-1950 Today's Date: 08/06/2019   History of Present Illness  Pt admitted for rhabdomyolysis with complaints of falls and fatigue. History includes DM, GERD, anxiety, and Parkinsons dx.  Clinical Impression  Pt is a pleasant 68 year old female who was admitted for rhabdomyolysis with multiple falls. Of note, + 4th L phalangeal fx, discussed/cleared with RN prior to consult performed. Pt performs bed mobility with max/total assist and pt unable to fully sit at EOB due to weakness and extreme pain, more in L knee than R foot. Unable to further perform transfers at this time. Pt demonstrates deficits with strength/mobility/pain. Complains of L knee pain moreso than R foot. Pt is very high falls risk Would benefit from skilled PT to address above deficits and promote optimal return to PLOF; recommend transition to STR upon discharge from acute hospitalization. May benefit from ortho consult. Discussed POC with patient who reports she'd rather dc home. If she goes home, will need wheelchair and hospital bed along with 24/7 caregivers.      Follow Up Recommendations SNF    Equipment Recommendations  Rolling walker with 5" wheels    Recommendations for Other Services       Precautions / Restrictions Precautions Precautions: Fall Restrictions Weight Bearing Restrictions: No      Mobility  Bed Mobility Overal bed mobility: Needs Assistance Bed Mobility: Supine to Sit     Supine to sit: Max assist;Total assist     General bed mobility comments: attempted to perform bed mobility and sit at EOB, pt couldn't tolerate B knees bending and was unable to fully come to seated position at EOB with max assist. Offerred to call for +2 assist, however pt refused stating she was in too much pain to continue participation. +2 assist for returning supine and repositioned at [facility]. Pillow placed under L  knee  Transfers                 General transfer comment: not attempted this date due to pain  Ambulation/Gait             General Gait Details: not performed  Stairs            Wheelchair Mobility    Modified Rankin (Stroke Patients Only)       Balance Overall balance assessment: History of Falls                                           Pertinent Vitals/Pain Pain Assessment: Faces Faces Pain Scale: Hurts even more Pain Location: L knee Pain Descriptors / Indicators: Aching;Discomfort;Dull;Grimacing;Guarding Pain Intervention(s): Limited activity within patient's tolerance;Repositioned    Home Living Family/patient expects to be discharged to:: Private residence Living Arrangements: Alone Available Help at Discharge: Personal care attendant (has caregivers PRN during day to run errands, no phys. assis) Type of Home: House Home Access: Level entry     Home Layout: One level Home Equipment: [place] - 4 wheels;Bedside commode;Shower seat;Hand held shower head;Adaptive equipment      Prior Function Level of Independence: Independent with assistive device(s)         Comments: was previously using rollater, however reports multiple falls. Room to room ambulator     Hand Dominance        Extremity/Trunk Assessment   Upper Extremity Assessment Upper Extremity Assessment:  Generalized weakness (B UE grossly 3+/5)    Lower Extremity Assessment Lower Extremity Assessment: Generalized weakness (B LE grossly 2/5; limited by pain)       Communication   Communication: No difficulties  Cognition Arousal/Alertness:  (sleepy) Behavior During Therapy: WFL for tasks assessed/performed Overall Cognitive Status: Within Functional Limits for tasks assessed                                 General Comments: very sleepy, however able to participate in therapy evaluation      General Comments      Exercises Other  Exercises Other Exercises: supine ther-ex performed on B LE including SLRs, heel slides (gentle ROM), and hip abd/add. Max assist for participation x 5 reps.    Assessment/Plan    PT Assessment Patient needs continued PT services  PT Problem List Decreased strength;Decreased activity tolerance;Decreased balance;Decreased mobility;Decreased safety awareness;Pain;Decreased range of motion       PT Treatment Interventions Gait training;DME instruction;Therapeutic activities;Therapeutic exercise;Balance training    PT Goals (Current goals can be found in the Care Plan section)  Acute Rehab PT Goals Patient Stated Goal: to go home, but open to other alternatives PT Goal Formulation: With patient Time For Goal Achievement: 08/20/19 Potential to Achieve Goals: Good    Frequency Min 2X/week   Barriers to discharge        Co-evaluation               AM-PAC PT "6 Clicks" Mobility  Outcome Measure Help needed turning from your back to your side while in a flat bed without using bedrails?: A Lot Help needed moving from lying on your back to sitting on the side of a flat bed without using bedrails?: Total Help needed moving to and from a bed to a chair (including a wheelchair)?: Total Help needed standing up from a chair using your arms (e.g., wheelchair or bedside chair)?: Total Help needed to walk in hospital room?: Total Help needed climbing 3-5 steps with a railing? : Total 6 Click Score: 7    End of Session Equipment Utilized During Treatment: Oxygen Activity Tolerance: Patient limited by pain Patient left: in bed;with bed alarm set Nurse Communication: Mobility status PT Visit Diagnosis: Repeated falls (R29.6);Muscle weakness (generalized) (M62.81);History of falling (Z91.81);Difficulty in walking, not elsewhere classified (R26.2);Pain Pain - Right/Left: Left Pain - part of body: Knee    Time: 9147-8295 PT Time Calculation (min) (ACUTE ONLY): 24 min   Charges:   PT  Evaluation $PT Eval Moderate Complexity: 1 Mod PT Treatments $Therapeutic Exercise: 8-22 mins        Greggory Stallion, PT, DPT [phone]   Theresa Mills 08/06/2019, 2:36 PM

## 2019-08-06 NOTE — Progress Notes (Signed)
Nutrition Brief Note  Patient identified on the Malnutrition Screening Tool (MST) Report  69 year old Caucasian female with a history significant for Parkinson's disease, diabetes mellitus, hypothyroidism who was brought in by EMS for evaluation of generalized weakness, frequent falls and diffuse joint pain.    Wt Readings from Last 15 Encounters:  08/05/19 82.6 kg  08/04/19 82.6 kg  06/19/19 82.6 kg  06/05/19 81.2 kg  09/12/18 90.3 kg  03/28/18 97.5 kg  01/21/18 96.3 kg  09/05/17 90.3 kg  06/11/17 90.3 kg  05/25/17 95.3 kg  04/19/17 96.7 kg  03/12/17 97.5 kg  01/08/17 100.2 kg  04/02/15 113.4 kg  01/20/13 (!) 145.5 kg    Body mass index is 31.24 kg/m. Patient meets criteria for obesity based on current BMI. Per chart, pt down 17lbs(9%) over the past year; this is not significant.   Current diet order is CHO modified, patient is consuming approximately 100% of meals at this time. Labs and medications reviewed.   No nutrition interventions warranted at this time. If nutrition issues arise, please consult RD.   Koleen Distance MS, RD, LDN Please refer to [facility] for RD and/or RD on-call/weekend/after hours pager

## 2019-08-06 NOTE — Progress Notes (Addendum)
[place] [facility] [place] NAME: Oluwaseyi Tull    MR#:  [id number]  DATE OF BIRTH:  09/20/1950  SUBJECTIVE:   Patient came in with generalized body aches has been having frequent falls at home. She has significant bilateral knee pain. Severe arthritis. Has Parkinson's and lives at home by herself.  She tells me her but whole body hurts. There is no other help at home. REVIEW OF SYSTEMS:   Review of Systems  Constitutional: Positive for malaise/fatigue. Negative for chills, fever and weight loss.  HENT: Negative for ear discharge, ear pain and nosebleeds.   Eyes: Negative for blurred vision, pain and discharge.  Respiratory: Negative for sputum production, shortness of breath, wheezing and stridor.   Cardiovascular: Negative for chest pain, palpitations, orthopnea and PND.  Gastrointestinal: Negative for abdominal pain, diarrhea, nausea and vomiting.  Genitourinary: Negative for frequency and urgency.  Musculoskeletal: Positive for back pain, falls and joint pain.  Neurological: Positive for weakness. Negative for sensory change, speech change and focal weakness.  Psychiatric/Behavioral: Negative for depression and hallucinations. The patient is not nervous/anxious.    Tolerating Diet: yes Tolerating PT: rehab  DRUG ALLERGIES:   Allergies  Allergen Reactions  . Codeine Other (See Comments)    HALLUCINATIONS  . Ephadrene [Cholestatin] Other (See Comments)    HYPER AND NERVOUS  . Other Other (See Comments)    HYPER  HYPER AND NERVOUS  . Valium [Diazepam] Other (See Comments)    OVERLY SENSITIVE/TO STRONG    VITALS:  Blood pressure (!) 109/52, pulse 63, temperature 97.8 F (36.6 C), temperature source Oral, resp. rate 18, height 5\' 4"  (1.626 m), weight 82.6 kg, SpO2 97 %.  PHYSICAL EXAMINATION:   Physical Exam  GENERAL:  69 y.o.-year-old patient lying in the bed with no acute distress. Morbid obesity EYES: Pupils equal, round,  reactive to light and accommodation. No scleral icterus.   HEENT: Head atraumatic, normocephalic. Oropharynx and nasopharynx clear. Bruise over the chin NECK:  Supple, no jugular venous distention. No thyroid enlargement, no tenderness.  LUNGS: Normal breath sounds bilaterally, no wheezing, rales, rhonchi. No use of accessory muscles of respiration.  CARDIOVASCULAR: S1, S2 normal. No murmurs, rubs, or gallops.  ABDOMEN: Soft, nontender, nondistended. Bowel sounds present. No organomegaly or mass.  EXTREMITIES: No cyanosis, clubbing or edema b/l.   Bilateral significant arthritic knees NEUROLOGIC: Cranial nerves II through XII are intact. No focal Motor or sensory deficits b/l. Generalized weakness PSYCHIATRIC:  patient is alert and oriented x 3.  SKIN: No obvious rash, lesion, or ulcer.   LABORATORY PANEL:  CBC Recent Labs  Lab 08/06/19 0454  WBC 7.7  HGB 12.4  HCT 36.8  PLT 122*    Chemistries  Recent Labs  Lab 08/06/19 0454  NA 145  K 3.9  CL 113*  CO2 27  GLUCOSE 103*  BUN 31*  CREATININE 0.69  CALCIUM 8.2*   Cardiac Enzymes No results for input(s): TROPONINI in the last 168 hours. RADIOLOGY:  DG Pelvis 1-2 Views  Result Date: 08/05/2019 CLINICAL DATA:  Left hip pain after multiple falls. EXAM: PELVIS - 1-2 VIEW COMPARISON:  Jun 10, 2017. FINDINGS: There is no evidence of pelvic fracture or diastasis. No pelvic bone lesions are seen. Mild-to-moderate degenerative changes seen involving both hip joints. IMPRESSION: Mild to moderate degenerative joint disease of both hip joints. No acute abnormality seen in the pelvis. Electronically Signed   By: Marijo Conception M.D.   On:  08/05/2019 09:54   CT Head Wo Contrast  Result Date: 08/05/2019 CLINICAL DATA:  Several recent falls EXAM: CT HEAD WITHOUT CONTRAST CT CERVICAL SPINE WITHOUT CONTRAST TECHNIQUE: Multidetector CT imaging of the head and cervical spine was performed following the standard protocol without intravenous  contrast. Multiplanar CT image reconstructions of the cervical spine were also generated. COMPARISON:  CT head and CT cervical spine Jun 10, 2017 FINDINGS: CT HEAD FINDINGS Brain: Moderate diffuse atrophy is stable. There is no intracranial mass, hemorrhage, extra-axial fluid collection, or midline shift. There is rather minimal small vessel disease in the centra semiovale bilaterally. No evident acute infarct. Vascular: No hyperdense vessel. There are foci of calcification in each carotid siphon. Skull: The bony calvarium appears intact. Sinuses/Orbits: There is an apparent retention cyst in the inferior left maxillary antrum. There is mucosal thickening in several ethmoid air cells. Other visualized paranasal sinuses are clear. Orbits appear symmetric bilaterally. There is rightward nasal septal deviation. Other: Mastoid air cells are clear. CT CERVICAL SPINE FINDINGS Alignment: There is no spondylolisthesis. Skull base and vertebrae: The skull base and craniocervical junction regions appear normal. There is no demonstrable fracture. No blastic or lytic bone lesions. Soft tissues and spinal canal: Prevertebral soft tissues are normal. There is narrowing of the predental space with osteoarthritic change in this area. There is no evident cord or canal hematoma. No paraspinous lesions are evident. Disc levels: There is mild disc space narrowing at C6-7 and C7-T1. Other disc spaces appear unremarkable. There is facet hypertrophy at multiple levels. No disc extrusion or stenosis evident. Upper chest: 6 visualized upper lung regions are clear. Other: There are foci of calcification in left carotid artery. IMPRESSION: CT head: Atrophy with slight periventricular small vessel disease. No acute infarct. No mass or hemorrhage. There are foci of arterial vascular calcification. There are foci of paranasal sinus disease. There is nasal septal deviation. CT cervical spine: No fracture or spondylolisthesis. Osteoarthritic  change noted at multiple levels. No disc extrusion or stenosis. There is left carotid artery calcification. Electronically Signed   By: Lowella Grip III M.D.   On: 08/05/2019 10:29   CT Cervical Spine Wo Contrast  Result Date: 08/05/2019 CLINICAL DATA:  Several recent falls EXAM: CT HEAD WITHOUT CONTRAST CT CERVICAL SPINE WITHOUT CONTRAST TECHNIQUE: Multidetector CT imaging of the head and cervical spine was performed following the standard protocol without intravenous contrast. Multiplanar CT image reconstructions of the cervical spine were also generated. COMPARISON:  CT head and CT cervical spine Jun 10, 2017 FINDINGS: CT HEAD FINDINGS Brain: Moderate diffuse atrophy is stable. There is no intracranial mass, hemorrhage, extra-axial fluid collection, or midline shift. There is rather minimal small vessel disease in the centra semiovale bilaterally. No evident acute infarct. Vascular: No hyperdense vessel. There are foci of calcification in each carotid siphon. Skull: The bony calvarium appears intact. Sinuses/Orbits: There is an apparent retention cyst in the inferior left maxillary antrum. There is mucosal thickening in several ethmoid air cells. Other visualized paranasal sinuses are clear. Orbits appear symmetric bilaterally. There is rightward nasal septal deviation. Other: Mastoid air cells are clear. CT CERVICAL SPINE FINDINGS Alignment: There is no spondylolisthesis. Skull base and vertebrae: The skull base and craniocervical junction regions appear normal. There is no demonstrable fracture. No blastic or lytic bone lesions. Soft tissues and spinal canal: Prevertebral soft tissues are normal. There is narrowing of the predental space with osteoarthritic change in this area. There is no evident cord or canal hematoma. No paraspinous  lesions are evident. Disc levels: There is mild disc space narrowing at C6-7 and C7-T1. Other disc spaces appear unremarkable. There is facet hypertrophy at multiple  levels. No disc extrusion or stenosis evident. Upper chest: 6 visualized upper lung regions are clear. Other: There are foci of calcification in left carotid artery. IMPRESSION: CT head: Atrophy with slight periventricular small vessel disease. No acute infarct. No mass or hemorrhage. There are foci of arterial vascular calcification. There are foci of paranasal sinus disease. There is nasal septal deviation. CT cervical spine: No fracture or spondylolisthesis. Osteoarthritic change noted at multiple levels. No disc extrusion or stenosis. There is left carotid artery calcification. Electronically Signed   By: Lowella Grip III M.D.   On: 08/05/2019 10:29   DG Knee Complete 4 Views Right  Result Date: 08/05/2019 CLINICAL DATA:  Right knee pain after fall. EXAM: RIGHT KNEE - COMPLETE 4+ VIEW COMPARISON:  Jun 10, 2017. FINDINGS: No evidence of fracture, dislocation, or joint effusion. Severe narrowing of lateral joint space is noted with osteophyte formation. Moderate narrowing of patellofemoral space is noted with osteophyte formation. Soft tissues are unremarkable. IMPRESSION: Severe degenerative joint disease. No acute abnormality seen in the right knee. Electronically Signed   By: Marijo Conception M.D.   On: 08/05/2019 09:49   DG Foot Complete Left  Result Date: 08/05/2019 CLINICAL DATA:  Acute left foot pain after multiple falls. EXAM: LEFT FOOT - COMPLETE 3+ VIEW COMPARISON:  April 05, 2015. FINDINGS: Mildly displaced fracture is seen involving the proximal base of the fourth proximal phalanx. Mild posterior calcaneal spurring is noted. Mild hallux valgus deformity is seen involving the first metatarsophalangeal joint. IMPRESSION: Mildly displaced fourth proximal phalangeal fracture. Electronically Signed   By: Marijo Conception M.D.   On: 08/05/2019 09:52   ASSESSMENT AND PLAN:   JACALYN BIGGS is a 69 y.o. female with medical history significant for diabetes mellitus, GERD, anxiety, sleep apnea,  Parkinson's disease and hypothyroidism by EMS generalized weakness, joint pain, fatigue and multiple falls at home.  Patient normally ambulates with a walker and states that all the time she had fallen at home she had set the walker to the side to do something and then she fell.  Acute rhabdomyolysis suspect from frequent falls -Patient is s/p multiple falls at home and presents to the ER for evaluation of generalized weakness and diffuse pain -She has elevated total CK levels -continue hydrate patient -CK levels 844--- IV fluids-- 283 -patient denies any chest pain. No history of coronary artery disease. Mild elevated troponin. Continue to monitor.  Frequent falls -Patient with a history of Parkinson's disease and severe osteoarthritis presents with presents for evaluation of generalized pain and weakness -She has had multiple falls at home -Will request PT evaluation-- recommends rehab -Place patient on fall precautions -x-ray left foot--Mildly displaced fourth proximal phalangeal fracture. -X-ray right knee severe DJD  Anxiety disorder-chronic Continue alprazolam  Parkinson's disease Continue Sinemet and Ropinirole  Depression Stable Continue Celexa, Remeron and Zoloft  Diabetes mellitus, well-controlled resume Metformin  Place patient on sliding scale coverage with insulin Maintain consistent carbohydrate diet  Hypokalemia Supplement potassium  Hypothyroidism Continue Synthroid  DVT prophylaxis: Lovenox Code Status: Full code Family Communication: Greater than 50% of time was spent discussing plan of care with patient at the bedside.  All questions and concerns have been addressed.  She verbalizes understanding and agrees with the plan.   Disposition Plan:  physical therapy recommending rehab. TOC for discharge planning  Consults called: Physical therapy  Status is: Inpatient  Remains inpatient appropriate because:Unsafe d/c plan   Dispo: The patient  is from: Home              Anticipated d/c is to: SNF              Anticipated d/c date is: 2 days              Patient currently is not medically stable to d/c. significant pain with falls. Need to have safe discharge plan. TOC working on discharge plan.       TOTAL TIME TAKING CARE OF THIS PATIENT: *25* minutes.  >50% time spent on counselling and coordination of care  Note: This dictation was prepared with Dragon dictation along with smaller phrase technology. Any transcriptional errors that result from this process are unintentional.  Fritzi Mandes M.D    [facility]   CC: Primary care physician; Ronnell Freshwater, NPPatient ID: [name], female   DOB: 04-30-1950, 69 y.o.   MRN: [id number]

## 2019-08-07 DIAGNOSIS — M159 Polyosteoarthritis, unspecified: Secondary | ICD-10-CM

## 2019-08-07 LAB — GLUCOSE, CAPILLARY
Glucose-Capillary: 82 mg/dL (ref 70–99)
Glucose-Capillary: 82 mg/dL (ref 70–99)
Glucose-Capillary: 86 mg/dL (ref 70–99)
Glucose-Capillary: 98 mg/dL (ref 70–99)

## 2019-08-07 NOTE — Progress Notes (Addendum)
[facility] [place] NAME: Theresa Mills    MR#:  [id number]  DATE OF BIRTH:  10-05-1950  SUBJECTIVE:   Patient came in with generalized body aches has been having frequent falls at home. She has significant bilateral knee pain. Severe arthritis. Has Parkinson's and lives at home by herself.  Overall better than yday although feeling weak and  bodyaches REVIEW OF SYSTEMS:   Review of Systems  Constitutional: Positive for malaise/fatigue. Negative for chills, fever and weight loss.  HENT: Negative for ear discharge, ear pain and nosebleeds.   Eyes: Negative for blurred vision, pain and discharge.  Respiratory: Negative for sputum production, shortness of breath, wheezing and stridor.   Cardiovascular: Negative for chest pain, palpitations, orthopnea and PND.  Gastrointestinal: Negative for abdominal pain, diarrhea, nausea and vomiting.  Genitourinary: Negative for frequency and urgency.  Musculoskeletal: Positive for back pain, falls and joint pain.  Neurological: Positive for weakness. Negative for sensory change, speech change and focal weakness.  Psychiatric/Behavioral: Negative for depression and hallucinations. The patient is not nervous/anxious.    Tolerating Diet: yes Tolerating PT: rehab  DRUG ALLERGIES:   Allergies  Allergen Reactions  . Codeine Other (See Comments)    HALLUCINATIONS  . Ephadrene [Cholestatin] Other (See Comments)    HYPER AND NERVOUS  . Other Other (See Comments)    HYPER  HYPER AND NERVOUS  . Valium [Diazepam] Other (See Comments)    OVERLY SENSITIVE/TO STRONG    VITALS:  Blood pressure (!) 139/56, pulse (!) 58, temperature 98 F (36.7 C), temperature source Oral, resp. rate 16, height 5\' 4"  (1.626 m), weight 82.6 kg, SpO2 95 %.  PHYSICAL EXAMINATION:   Physical Exam  GENERAL:  69 y.o.-year-old patient lying in the bed with no acute distress. Morbid obesity EYES: Pupils equal, round, reactive to  light and accommodation. No scleral icterus.   HEENT: Head atraumatic, normocephalic. Oropharynx and nasopharynx clear. Bruise over the chin NECK:  Supple, no jugular venous distention. No thyroid enlargement, no tenderness.  LUNGS: Normal breath sounds bilaterally, no wheezing, rales, rhonchi. No use of accessory muscles of respiration.  CARDIOVASCULAR: S1, S2 normal. No murmurs, rubs, or gallops.  ABDOMEN: Soft, nontender, nondistended. Bowel sounds present. No organomegaly or mass.  EXTREMITIES: No cyanosis, clubbing or edema b/l.   Bilateral significant arthritic knees NEUROLOGIC: Cranial nerves II through XII are intact. No focal Motor or sensory deficits b/l. Generalized weakness PSYCHIATRIC:  patient is alert and oriented x 3.  SKIN: No obvious rash, lesion, or ulcer.  Bruise + LABORATORY PANEL:  CBC Recent Labs  Lab 08/06/19 0454  WBC 7.7  HGB 12.4  HCT 36.8  PLT 122*    Chemistries  Recent Labs  Lab 08/06/19 0454  NA 145  K 3.9  CL 113*  CO2 27  GLUCOSE 103*  BUN 31*  CREATININE 0.69  CALCIUM 8.2*   Cardiac Enzymes No results for input(s): TROPONINI in the last 168 hours. RADIOLOGY:  DG Knee Complete 4 Views Left  Result Date: 08/06/2019 CLINICAL DATA:  Left knee pain after fall. EXAM: LEFT KNEE - COMPLETE 4+ VIEW COMPARISON:  None. FINDINGS: No evidence of fracture or dislocation. Moderate to advanced tricompartmental peripheral spurring. There is mild medial tibiofemoral joint space narrowing. Degenerative spurring of the tibial spines. There is a small to moderate joint effusion. Small quadriceps tendon enthesophyte. Soft tissues are unremarkable. IMPRESSION: 1. No acute fracture or subluxation of the left knee. 2. Moderate to  advanced tricompartmental osteoarthritis. Small to moderate joint effusion. Electronically Signed   By: Keith Rake M.D.   On: 08/06/2019 17:20   ASSESSMENT AND PLAN:   Theresa Mills is a 69 y.o. female with medical history  significant for diabetes mellitus, GERD, anxiety, sleep apnea, Parkinson's disease and hypothyroidism by EMS generalized weakness, joint pain, fatigue and multiple falls at home.  Patient normally ambulates with a walker and states that all the time she had fallen at home she had set the walker to the side to do something and then she fell.  Acute rhabdomyolysis suspect from frequent falls with severe bilateral knee DJD -Patient is s/p multiple falls at home and presents to the ER for evaluation of generalized weakness and diffuse pain -She has elevated total CK levels -continue to hydrate patient for another day--feels weak -CK levels 844--- IV fluids-- 283--Cont IVF--CK in am -pt encouraged to take oral fluids -patient denies any chest pain. No history of coronary artery disease. Mild elevated troponin.   Frequent falls -Patient with a history of Parkinson's disease and severe osteoarthritis presents with presents for evaluation of generalized pain and weakness -She has had multiple falls at home -Will request PT evaluation-- recommends rehab--d/w TOC for d/c planning -Place patient on fall precautions -x-ray left foot--Mildly displaced fourth proximal phalangeal fracture. -X-ray right knee severe DJD  Anxiety disorder-chronic Continue alprazolam  Parkinson's disease Continue Sinemet and Ropinirole  Depression Stable Continue Celexa, Remeron and Zoloft  Diabetes mellitus, well-controlled resume Metformin  Place patient on sliding scale coverage with insulin Maintain consistent carbohydrate diet  Hypokalemia Supplement potassium  Hypothyroidism Continue Synthroid  DVT prophylaxis: Lovenox Code Status: Full code Family Communication: pt has been communicating with her family sister Disposition Plan:  physical therapy recommending rehab. TOC for discharge planning Consults called: Physical therapy  Status is: Inpatient  Remains inpatient appropriate  because:Unsafe d/c plan TOC to discuss d/c plans  Dispo: The patient is from: Home              Anticipated d/c is to: SNF              Anticipated d/c date is:1-2              Patient currently is not stable.  Need to have safe discharge plan.  TOC working on discharge plan. Cont IV hydration, CK levels trending down slowly. Continue IV hydration for today.     TOTAL TIME TAKING CARE OF THIS PATIENT: *25* minutes.  >50% time spent on counselling and coordination of care  Note: This dictation was prepared with Dragon dictation along with smaller phrase technology. Any transcriptional errors that result from this process are unintentional.  Fritzi Mandes M.D    [facility]   CC: Primary care physician; Ronnell Freshwater, NPPatient ID: Theresa Mills, female   DOB: November 24, 1950, 69 y.o.   MRN: [id number]

## 2019-08-07 NOTE — NC FL2 (Signed)
[place] LEVEL OF CARE SCREENING TOOL     IDENTIFICATION  Patient Name: Theresa Mills Birthdate: 09-Dec-1950 Sex: female Admission Date (Current Location): 08/05/2019  [facility] and [place] Number:  Engineering geologist and Address:         Provider Number: [id number]  Attending Physician Name and Address:  Fritzi Mandes, MD  Relative Name and Phone Number:       Current Level of Care: Hospital Recommended Level of Care: [place] Prior Approval Number:    Date Approved/Denied:   PASRR Number: [id number]  Discharge Plan: SNF    Current Diagnoses: Patient Active Problem List   Diagnosis Date Noted   Osteoarthritis of multiple joints    Frequent falls 08/05/2019   Urinary tract infection with hematuria 06/11/2019   Nausea and vomiting 02/13/2019   Acquired hypothyroidism 02/13/2019   Acquired bilateral hammer toes 01/23/2019   Type 2 diabetes mellitus with hyperglycemia ([place]) 09/15/2018   Encounter for general adult medical examination with abnormal findings 09/05/2017   Dysuria 09/05/2017   Uncontrolled type 2 diabetes mellitus with hyperglycemia ([place]) 06/18/2017   Rib fractures 06/11/2017   Pressure injury of skin 06/11/2017   Rhabdomyolysis 06/10/2017   Elevated troponin 06/10/2017   Hypernatremia 06/10/2017   Fall 06/10/2017   Arthritis 01/08/2017   Cataracts, bilateral 01/08/2017   Chickenpox 01/08/2017   Shingles 01/08/2017   Depression 01/08/2017   Diabetes mellitus type 2, uncomplicated ([place]) 88/50/2774   Hyperlipidemia, unspecified 01/08/2017   Essential hypertension 01/08/2017   Migraine headache 01/08/2017   Thyroid disease 01/08/2017   Parkinson disease ([place]) 01/08/2017   Anxiety, generalized 09/01/2016   Dementia due to Parkinson's disease without behavioral disturbance ([place]) 09/01/2016   History of depression 09/01/2016   Morbid obesity with BMI of 40.0-44.9, adult ([place]) 09/01/2016    RLS (restless legs syndrome) 09/01/2016    Orientation RESPIRATION BLADDER Height & Weight     Self, Time, Situation, Place  Normal Incontinent, External catheter Weight: 82.6 kg Height:  5\' 4"  (162.6 cm)  BEHAVIORAL SYMPTOMS/MOOD NEUROLOGICAL BOWEL NUTRITION STATUS      Continent Diet (Carb modified)  AMBULATORY STATUS COMMUNICATION OF NEEDS Skin   Total Care Verbally Normal                       Personal Care Assistance Level of Assistance              Functional Limitations Info             SPECIAL CARE FACTORS FREQUENCY  PT (By licensed PT), OT (By licensed OT)                    Contractures Contractures Info: Not present    Additional Factors Info  Code Status, Allergies Code Status Info: Full Allergies Info: Codeine, ephadrene, valium           Current Medications (08/07/2019):  This is the current hospital active medication list Current Facility-Administered Medications  Medication Dose Route Frequency Provider Last Rate Last Admin   0.9 %  sodium chloride infusion   Intravenous Continuous Agbata, Tochukwu, MD 100 mL/hr at 08/07/19 0500 New Bag at 08/07/19 0500   acetaminophen (TYLENOL) tablet 650 mg  650 mg Oral Q6H PRN Lang Snow, NP   650 mg at 08/06/19 1806   ALPRAZolam (XANAX) tablet 0.25 mg  0.25 mg Oral q morning - 10a Agbata, Tochukwu, MD   0.25 mg at 08/07/19 1287  And   ALPRAZolam Duanne Moron) tablet 0.5 mg  0.5 mg Oral QHS PRN Agbata, Tochukwu, MD   0.5 mg at 08/06/19 2149   calcium-vitamin D (OSCAL WITH D) 500-200 MG-UNIT per tablet 1 tablet  1 tablet Oral Daily Agbata, Tochukwu, MD   1 tablet at 08/07/19 0858   carbidopa-levodopa (SINEMET CR) 50-200 MG per tablet controlled release 1 tablet  1 tablet Oral BID Agbata, Tochukwu, MD   1 tablet at 08/07/19 0500   Carbidopa-Levodopa ER (SINEMET CR) 25-100 MG tablet controlled release 1 tablet  1 tablet Oral QID Dallie Piles, RPH   1 tablet at 08/07/19 1210    celecoxib (CELEBREX) capsule 200 mg  200 mg Oral BID PRN Fritzi Mandes, MD   200 mg at 08/06/19 2213   citalopram (CELEXA) tablet 40 mg  40 mg Oral Daily Agbata, Tochukwu, MD   40 mg at 08/07/19 0858   enoxaparin (LOVENOX) injection 40 mg  40 mg Subcutaneous Q24H Agbata, Tochukwu, MD   40 mg at 08/07/19 1210   insulin aspart (novoLOG) injection 0-15 Units  0-15 Units Subcutaneous TID WC Agbata, Tochukwu, MD   2 Units at 08/06/19 1224   levothyroxine (SYNTHROID) tablet 175 mcg  175 mcg Oral Q0600 Agbata, Tochukwu, MD   175 mcg at 08/07/19 0500   melatonin tablet 2.5 mg  2.5 mg Oral QHS Agbata, Tochukwu, MD   2.5 mg at 08/06/19 2149   metFORMIN (GLUCOPHAGE) tablet 500 mg  500 mg Oral BID WC Fritzi Mandes, MD   500 mg at 08/07/19 0858   mirtazapine (REMERON) tablet 7.5 mg  7.5 mg Oral QHS Agbata, Tochukwu, MD   7.5 mg at 08/06/19 2149   multivitamin with minerals tablet 1 tablet  1 tablet Oral Daily Agbata, Tochukwu, MD   1 tablet at 08/07/19 0857   ondansetron (ZOFRAN) tablet 4 mg  4 mg Oral Q6H PRN Agbata, Tochukwu, MD       Or   ondansetron (ZOFRAN) injection 4 mg  4 mg Intravenous Q6H PRN Agbata, Tochukwu, MD   4 mg at 08/05/19 1230   pantoprazole (PROTONIX) EC tablet 40 mg  40 mg Oral Daily Agbata, Tochukwu, MD   40 mg at 08/07/19 0858   rOPINIRole (REQUIP) tablet 1 mg  1 mg Oral BID Agbata, Tochukwu, MD   1 mg at 08/07/19 0857   sertraline (ZOLOFT) tablet 50 mg  50 mg Oral Daily Agbata, Tochukwu, MD   50 mg at 08/07/19 0857   sodium chloride flush (NS) 0.9 % injection 3 mL  3 mL Intravenous Once Earleen Newport, MD         Discharge Medications: Please see discharge summary for a list of discharge medications.  Relevant Imaging Results:  Relevant Lab Results:   Additional Information SSN [id number]  Beverly Sessions, RN

## 2019-08-07 NOTE — Care Management Important Message (Signed)
Important Message  Patient Details  Name: Theresa Mills MRN: [id number] Date of Birth: 09-May-1950   Medicare Important Message Given:  Yes  Initial Medicare IM given by Patient Access Associate on 08/07/2019 at 10:54am.   Dannette Barbara 08/07/2019, 7:51 PM

## 2019-08-08 LAB — GLUCOSE, CAPILLARY
Glucose-Capillary: 73 mg/dL (ref 70–99)
Glucose-Capillary: 110 mg/dL — ABNORMAL HIGH (ref 70–99)
Glucose-Capillary: 87 mg/dL (ref 70–99)
Glucose-Capillary: 110 mg/dL — ABNORMAL HIGH (ref 70–99)

## 2019-08-08 MED ORDER — CARBIDOPA-LEVODOPA 25-100 MG PO TABS
2.0000 | ORAL_TABLET | Freq: Four times a day (QID) | ORAL | Status: DC
  Administered 2019-08-08 – 2019-08-09 (×4): 2 via ORAL
  Filled 2019-08-08 (×6): qty 2

## 2019-08-08 MED ORDER — CARBIDOPA-LEVODOPA 25-100 MG PO TABS
1.0000 | ORAL_TABLET | Freq: Four times a day (QID) | ORAL | Status: DC
  Filled 2019-08-08 (×2): qty 1

## 2019-08-08 MED ORDER — SODIUM CHLORIDE 0.9 % IV SOLN: INTRAVENOUS | Status: DC

## 2019-08-08 NOTE — TOC Initial Note (Signed)
Transition of Care [facility]) - Initial/Assessment Note    Patient Details  Name: Theresa Mills MRN: [id number] Date of Birth: 1951-01-22  [facility] CM/SW Contact:    Beverly Sessions, RN Phone Number: 08/08/2019, 11:43 AM  Clinical Narrative:                 Patient admitted with rhabdomyolysis Patient states that she lives at home alone. Patient states that her Dorothy Spark is available for support, but not 24/7 care.  She states that Vaughan Basta comes to stay with her, run errands, light house work etc.   PT has evaluated patient and recommends SNF.  Patient in agreement.  Patients states that she has a RW, BSC, and rollator in the home  [facility]P - [facility] - denies issues getting medications.  States they are delivered in pill packs  Existing Pasrr Fl2 completed Bed search initiated   UPDATE:  Bed offers presented  Patient accepted at [facility].  Notified Roderic Palau at [facility] and accepted in [place]  Expected Discharge Plan: [place] Barriers to Discharge: Continued Medical Work up   Patient Goals and CMS Choice   CMS Medicare.gov Compare Post Acute Care list provided to:: Patient Choice offered to / list presented to : Patient  Expected Discharge Plan and Services Expected Discharge Plan: [place]   Discharge Planning Services: CM Consult   Living arrangements for the past 2 months: Single Family Home                                      Prior Living Arrangements/Services Living arrangements for the past 2 months: Single Family Home Lives with:: Self Patient language and need for interpreter reviewed:: Yes Do you feel safe going back to the place where you live?: Yes      Need for Family Participation in Patient Care: Yes (Comment) Care giver support system in place?: Yes (comment) Current home services: DME Criminal Activity/Legal Involvement Pertinent to  Current Situation/Hospitalization: No - Comment as needed  Activities of Daily Living Home Assistive Devices/Equipment: Cane (specify quad or straight), Walker (specify type) ADL Screening (condition at time of admission) Patient's cognitive ability adequate to safely complete daily activities?: Yes Is the patient deaf or have difficulty hearing?: No Does the patient have difficulty seeing, even when wearing glasses/contacts?: No Does the patient have difficulty concentrating, remembering, or making decisions?: No Patient able to express need for assistance with ADLs?: Yes Does the patient have difficulty dressing or bathing?: No Independently performs ADLs?: No Communication: Independent Dressing (OT): Needs assistance Is this a change from baseline?: Pre-admission baseline Grooming: Needs assistance Is this a change from baseline?: Pre-admission baseline Feeding: Independent Bathing: Needs assistance Is this a change from baseline?: Change from baseline, expected to last >3 days Toileting: Needs assistance Is this a change from baseline?: Change from baseline, expected to last >3days In/Out Bed: Needs assistance Is this a change from baseline?: Change from baseline, expected to last >3 days Walks in Home: Needs assistance Is this a change from baseline?: Change from baseline, expected to last >3 days Does the patient have difficulty walking or climbing stairs?: Yes Weakness of Legs: Both Weakness of Arms/Hands: Both  Permission Sought/Granted                  Emotional Assessment Appearance:: Appears stated age  Attitude/Demeanor/Rapport: Engaged Affect (typically observed): Accepting Orientation: : Oriented to Self, Oriented to Place, Oriented to  Time, Oriented to Situation   Psych Involvement: No (comment)  Admission diagnosis:  Rhabdomyolysis [M62.82] Weakness [R53.1] NSTEMI (non-ST elevated myocardial infarction) ([place]) [I21.4] Fall, initial encounter  [W19.XXXA] Patient Active Problem List   Diagnosis Date Noted  . Osteoarthritis of multiple joints   . Frequent falls 08/05/2019  . Urinary tract infection with hematuria 06/11/2019  . Nausea and vomiting 02/13/2019  . Acquired hypothyroidism 02/13/2019  . Acquired bilateral hammer toes 01/23/2019  . Type 2 diabetes mellitus with hyperglycemia ([place]) 09/15/2018  . Encounter for general adult medical examination with abnormal findings 09/05/2017  . Dysuria 09/05/2017  . Uncontrolled type 2 diabetes mellitus with hyperglycemia ([place]) 06/18/2017  . Rib fractures 06/11/2017  . Pressure injury of skin 06/11/2017  . Rhabdomyolysis 06/10/2017  . Elevated troponin 06/10/2017  . Hypernatremia 06/10/2017  . Fall 06/10/2017  . Arthritis 01/08/2017  . Cataracts, bilateral 01/08/2017  . Chickenpox 01/08/2017  . Shingles 01/08/2017  . Depression 01/08/2017  . Diabetes mellitus type 2, uncomplicated ([place]) 78/24/2353  . Hyperlipidemia, unspecified 01/08/2017  . Essential hypertension 01/08/2017  . Migraine headache 01/08/2017  . Thyroid disease 01/08/2017  . Parkinson disease ([place]) 01/08/2017  . Anxiety, generalized 09/01/2016  . Dementia due to Parkinson's disease without behavioral disturbance ([place]) 09/01/2016  . History of depression 09/01/2016  . Morbid obesity with BMI of 40.0-44.9, adult ([place]) 09/01/2016  . RLS (restless legs syndrome) 09/01/2016   PCP:  Ronnell Freshwater, NP Pharmacy:   [place], [place] - [address] [place] [address] [place] [address] Phone: [phone] Fax: [phone]  [place], [place] - [place] [address] Phone: [phone] Fax: [phone]     Social Determinants of Health (SDOH) Interventions    Readmission Risk Interventions No flowsheet data found.

## 2019-08-09 DIAGNOSIS — R338 Other retention of urine: Secondary | ICD-10-CM | POA: Diagnosis not present

## 2019-08-09 DIAGNOSIS — R0689 Other abnormalities of breathing: Secondary | ICD-10-CM | POA: Diagnosis not present

## 2019-08-09 DIAGNOSIS — R4182 Altered mental status, unspecified: Secondary | ICD-10-CM | POA: Diagnosis not present

## 2019-08-09 DIAGNOSIS — R339 Retention of urine, unspecified: Secondary | ICD-10-CM | POA: Diagnosis not present

## 2019-08-09 DIAGNOSIS — G4730 Sleep apnea, unspecified: Secondary | ICD-10-CM | POA: Diagnosis not present

## 2019-08-09 DIAGNOSIS — R2681 Unsteadiness on feet: Secondary | ICD-10-CM | POA: Diagnosis not present

## 2019-08-09 DIAGNOSIS — R0902 Hypoxemia: Secondary | ICD-10-CM | POA: Diagnosis not present

## 2019-08-09 DIAGNOSIS — H259 Unspecified age-related cataract: Secondary | ICD-10-CM | POA: Diagnosis not present

## 2019-08-09 DIAGNOSIS — Z20822 Contact with and (suspected) exposure to covid-19: Secondary | ICD-10-CM | POA: Diagnosis present

## 2019-08-09 DIAGNOSIS — K219 Gastro-esophageal reflux disease without esophagitis: Secondary | ICD-10-CM | POA: Diagnosis present

## 2019-08-09 DIAGNOSIS — E039 Hypothyroidism, unspecified: Secondary | ICD-10-CM | POA: Diagnosis not present

## 2019-08-09 DIAGNOSIS — D649 Anemia, unspecified: Secondary | ICD-10-CM | POA: Diagnosis not present

## 2019-08-09 DIAGNOSIS — W19XXXA Unspecified fall, initial encounter: Secondary | ICD-10-CM

## 2019-08-09 DIAGNOSIS — M4646 Discitis, unspecified, lumbar region: Secondary | ICD-10-CM | POA: Diagnosis present

## 2019-08-09 DIAGNOSIS — N390 Urinary tract infection, site not specified: Secondary | ICD-10-CM | POA: Diagnosis not present

## 2019-08-09 DIAGNOSIS — M4856XA Collapsed vertebra, not elsewhere classified, lumbar region, initial encounter for fracture: Secondary | ICD-10-CM | POA: Diagnosis present

## 2019-08-09 DIAGNOSIS — K6812 Psoas muscle abscess: Secondary | ICD-10-CM | POA: Diagnosis present

## 2019-08-09 DIAGNOSIS — A4159 Other Gram-negative sepsis: Secondary | ICD-10-CM | POA: Diagnosis present

## 2019-08-09 DIAGNOSIS — F0280 Dementia in other diseases classified elsewhere without behavioral disturbance: Secondary | ICD-10-CM | POA: Diagnosis present

## 2019-08-09 DIAGNOSIS — R911 Solitary pulmonary nodule: Secondary | ICD-10-CM | POA: Diagnosis not present

## 2019-08-09 DIAGNOSIS — R296 Repeated falls: Secondary | ICD-10-CM | POA: Diagnosis present

## 2019-08-09 DIAGNOSIS — E1169 Type 2 diabetes mellitus with other specified complication: Secondary | ICD-10-CM | POA: Diagnosis present

## 2019-08-09 DIAGNOSIS — I959 Hypotension, unspecified: Secondary | ICD-10-CM | POA: Diagnosis not present

## 2019-08-09 DIAGNOSIS — M6282 Rhabdomyolysis: Secondary | ICD-10-CM | POA: Diagnosis not present

## 2019-08-09 DIAGNOSIS — E876 Hypokalemia: Secondary | ICD-10-CM | POA: Diagnosis not present

## 2019-08-09 DIAGNOSIS — E1165 Type 2 diabetes mellitus with hyperglycemia: Secondary | ICD-10-CM | POA: Diagnosis not present

## 2019-08-09 DIAGNOSIS — E440 Moderate protein-calorie malnutrition: Secondary | ICD-10-CM | POA: Diagnosis present

## 2019-08-09 DIAGNOSIS — R531 Weakness: Secondary | ICD-10-CM | POA: Diagnosis not present

## 2019-08-09 DIAGNOSIS — M8949 Other hypertrophic osteoarthropathy, multiple sites: Secondary | ICD-10-CM | POA: Diagnosis not present

## 2019-08-09 DIAGNOSIS — M4626 Osteomyelitis of vertebra, lumbar region: Secondary | ICD-10-CM | POA: Diagnosis present

## 2019-08-09 DIAGNOSIS — G4733 Obstructive sleep apnea (adult) (pediatric): Secondary | ICD-10-CM | POA: Diagnosis present

## 2019-08-09 DIAGNOSIS — F339 Major depressive disorder, recurrent, unspecified: Secondary | ICD-10-CM | POA: Diagnosis not present

## 2019-08-09 DIAGNOSIS — F29 Unspecified psychosis not due to a substance or known physiological condition: Secondary | ICD-10-CM | POA: Diagnosis not present

## 2019-08-09 DIAGNOSIS — N12 Tubulo-interstitial nephritis, not specified as acute or chronic: Secondary | ICD-10-CM | POA: Diagnosis present

## 2019-08-09 DIAGNOSIS — N179 Acute kidney failure, unspecified: Secondary | ICD-10-CM | POA: Diagnosis present

## 2019-08-09 DIAGNOSIS — I10 Essential (primary) hypertension: Secondary | ICD-10-CM | POA: Diagnosis present

## 2019-08-09 DIAGNOSIS — A419 Sepsis, unspecified organism: Secondary | ICD-10-CM | POA: Diagnosis not present

## 2019-08-09 DIAGNOSIS — I214 Non-ST elevation (NSTEMI) myocardial infarction: Secondary | ICD-10-CM | POA: Diagnosis not present

## 2019-08-09 DIAGNOSIS — E872 Acidosis: Secondary | ICD-10-CM | POA: Diagnosis present

## 2019-08-09 DIAGNOSIS — G20 Parkinson's disease: Secondary | ICD-10-CM | POA: Diagnosis present

## 2019-08-09 DIAGNOSIS — M1389 Other specified arthritis, multiple sites: Secondary | ICD-10-CM | POA: Diagnosis not present

## 2019-08-09 DIAGNOSIS — Z7401 Bed confinement status: Secondary | ICD-10-CM | POA: Diagnosis not present

## 2019-08-09 DIAGNOSIS — M6281 Muscle weakness (generalized): Secondary | ICD-10-CM | POA: Diagnosis not present

## 2019-08-09 DIAGNOSIS — N10 Acute tubulo-interstitial nephritis: Secondary | ICD-10-CM | POA: Diagnosis not present

## 2019-08-09 DIAGNOSIS — B964 Proteus (mirabilis) (morganii) as the cause of diseases classified elsewhere: Secondary | ICD-10-CM | POA: Diagnosis not present

## 2019-08-09 DIAGNOSIS — N136 Pyonephrosis: Secondary | ICD-10-CM | POA: Diagnosis present

## 2019-08-09 DIAGNOSIS — R262 Difficulty in walking, not elsewhere classified: Secondary | ICD-10-CM | POA: Diagnosis not present

## 2019-08-09 DIAGNOSIS — F419 Anxiety disorder, unspecified: Secondary | ICD-10-CM | POA: Diagnosis present

## 2019-08-09 DIAGNOSIS — G9341 Metabolic encephalopathy: Secondary | ICD-10-CM | POA: Diagnosis present

## 2019-08-09 DIAGNOSIS — E119 Type 2 diabetes mellitus without complications: Secondary | ICD-10-CM | POA: Diagnosis not present

## 2019-08-09 DIAGNOSIS — R069 Unspecified abnormalities of breathing: Secondary | ICD-10-CM | POA: Diagnosis not present

## 2019-08-09 DIAGNOSIS — E890 Postprocedural hypothyroidism: Secondary | ICD-10-CM | POA: Diagnosis present

## 2019-08-09 DIAGNOSIS — Z9181 History of falling: Secondary | ICD-10-CM | POA: Diagnosis not present

## 2019-08-09 DIAGNOSIS — F411 Generalized anxiety disorder: Secondary | ICD-10-CM | POA: Diagnosis not present

## 2019-08-09 DIAGNOSIS — N1330 Unspecified hydronephrosis: Secondary | ICD-10-CM | POA: Diagnosis not present

## 2019-08-09 DIAGNOSIS — T796XXA Traumatic ischemia of muscle, initial encounter: Secondary | ICD-10-CM | POA: Diagnosis not present

## 2019-08-09 DIAGNOSIS — G061 Intraspinal abscess and granuloma: Secondary | ICD-10-CM | POA: Diagnosis present

## 2019-08-09 DIAGNOSIS — M2550 Pain in unspecified joint: Secondary | ICD-10-CM | POA: Diagnosis not present

## 2019-08-09 DIAGNOSIS — S92512D Displaced fracture of proximal phalanx of left lesser toe(s), subsequent encounter for fracture with routine healing: Secondary | ICD-10-CM | POA: Diagnosis not present

## 2019-08-09 DIAGNOSIS — G2581 Restless legs syndrome: Secondary | ICD-10-CM | POA: Diagnosis present

## 2019-08-09 DIAGNOSIS — W19XXXD Unspecified fall, subsequent encounter: Secondary | ICD-10-CM | POA: Diagnosis not present

## 2019-08-09 DIAGNOSIS — R6520 Severe sepsis without septic shock: Secondary | ICD-10-CM | POA: Diagnosis present

## 2019-08-09 DIAGNOSIS — R5381 Other malaise: Secondary | ICD-10-CM | POA: Diagnosis not present

## 2019-08-09 DIAGNOSIS — R41841 Cognitive communication deficit: Secondary | ICD-10-CM | POA: Diagnosis not present

## 2019-08-09 DIAGNOSIS — R7881 Bacteremia: Secondary | ICD-10-CM | POA: Diagnosis not present

## 2019-08-09 DIAGNOSIS — R918 Other nonspecific abnormal finding of lung field: Secondary | ICD-10-CM | POA: Diagnosis not present

## 2019-08-09 DIAGNOSIS — N132 Hydronephrosis with renal and ureteral calculous obstruction: Secondary | ICD-10-CM | POA: Diagnosis not present

## 2019-08-09 DIAGNOSIS — E785 Hyperlipidemia, unspecified: Secondary | ICD-10-CM | POA: Diagnosis not present

## 2019-08-09 LAB — GLUCOSE, CAPILLARY
Glucose-Capillary: 109 mg/dL — ABNORMAL HIGH (ref 70–99)
Glucose-Capillary: 108 mg/dL — ABNORMAL HIGH (ref 70–99)
Glucose-Capillary: 122 mg/dL — ABNORMAL HIGH (ref 70–99)

## 2019-08-09 LAB — CK: Total CK: 56 U/L (ref 38–234)

## 2019-08-09 LAB — SARS CORONAVIRUS 2 BY RT PCR (HOSPITAL ORDER, PERFORMED IN ~~LOC~~ HOSPITAL LAB): SARS Coronavirus 2: NEGATIVE

## 2019-08-09 MED ORDER — DOCUSATE SODIUM 100 MG PO CAPS: 100.0000 mg | ORAL_CAPSULE | Freq: Two times a day (BID) | ORAL | 0 refills | Status: DC

## 2019-08-09 MED ORDER — BUTALBITAL-APAP-CAFFEINE 50-325-40 MG PO TABS
2.0000 | ORAL_TABLET | Freq: Once | ORAL | Status: AC
  Administered 2019-08-09: 2 via ORAL
  Filled 2019-08-09: qty 2

## 2019-08-09 MED ORDER — ASPIRIN-ACETAMINOPHEN-CAFFEINE 250-250-65 MG PO TABS
2.0000 | ORAL_TABLET | Freq: Once | ORAL | Status: DC
  Filled 2019-08-09: qty 2

## 2019-08-09 MED ORDER — BISACODYL 10 MG RE SUPP: 10.0000 mg | Freq: Every day | RECTAL | Status: DC | PRN

## 2019-08-09 MED ORDER — ASPIRIN-ACETAMINOPHEN-CAFFEINE 250-250-65 MG PO TABS: 1.0000 | ORAL_TABLET | Freq: Once | ORAL | Status: DC

## 2019-08-09 MED ORDER — ALPRAZOLAM 0.25 MG PO TABS: 0.2500 mg | ORAL_TABLET | Freq: Every morning | ORAL | 0 refills | Status: DC

## 2019-08-09 MED ORDER — ALPRAZOLAM 0.5 MG PO TABS: 0.5000 mg | ORAL_TABLET | Freq: Every evening | ORAL | 0 refills | Status: DC | PRN

## 2019-08-09 MED ORDER — DOCUSATE SODIUM 100 MG PO CAPS
100.0000 mg | ORAL_CAPSULE | Freq: Two times a day (BID) | ORAL | Status: DC
  Administered 2019-08-09: 100 mg via ORAL
  Filled 2019-08-09: qty 1

## 2019-08-09 NOTE — Progress Notes (Signed)
Theresa Mills to be D/C'd to [facility] per MD order.  Discussed prescriptions and follow up appointments with the patient. Prescriptions given to patient, medication list explained in detail. Pt verbalized understanding.  Allergies as of 08/09/2019       Reactions   Codeine Other (See Comments)   HALLUCINATIONS   Ephadrene [cholestatin] Other (See Comments)   HYPER AND NERVOUS   Other Other (See Comments)   HYPER HYPER AND NERVOUS   Valium [diazepam] Other (See Comments)   OVERLY SENSITIVE/TO STRONG        Medication List     TAKE these medications    ALPRAZolam 0.25 MG tablet Commonly known as: XANAX Take 1 tablet (0.25 mg total) by mouth every morning. What changed:  medication strength how much to take how to take this when to take this additional instructions   ALPRAZolam 0.5 MG tablet Commonly known as: XANAX Take 1 tablet (0.5 mg total) by mouth at bedtime as needed for anxiety. What changed: You were already taking a medication with the same name, and this prescription was added. Make sure you understand how and when to take each.   Calcium High Potency/Vitamin D 600-200 MG-UNIT Tabs Generic drug: Calcium Carbonate-Vitamin D Take 1 tablet by mouth daily.   carbidopa-levodopa 50-200 MG tablet Commonly known as: SINEMET CR Take 1 tablet by mouth 2 (two) times daily.   carbidopa-levodopa 25-100 MG tablet Commonly known as: SINEMET IR Take 2 tablets by mouth 4 (four) times daily.   celecoxib 200 MG capsule Commonly known as: CELEBREX Take 1 capsule (200 mg total) by mouth 2 (two) times daily as needed.   citalopram 40 MG tablet Commonly known as: CELEXA Take 40 mg by mouth daily.   docusate sodium 100 MG capsule Commonly known as: COLACE Take 1 capsule (100 mg total) by mouth 2 (two) times daily.   levothyroxine 175 MCG tablet Commonly known as: SYNTHROID Take 1 tablet (175 mcg total) by mouth daily.   melatonin 3 MG Tabs  tablet Take by mouth.   metFORMIN 500 MG tablet Commonly known as: GLUCOPHAGE Take 500 mg by mouth 2 (two) times daily with a meal.   mirtazapine 7.5 MG tablet Commonly known as: REMERON Take 1 tablet (7.5 mg total) by mouth at bedtime.   MULTIVITAMIN WOMEN 50+ PO Take 1 tablet by mouth daily.   omeprazole 40 MG capsule Commonly known as: PRILOSEC Take 1 capsule (40 mg total) by mouth daily.   ondansetron 4 MG disintegrating tablet Commonly known as: ZOFRAN-ODT Take 1 tablet (4 mg total) by mouth every 8 (eight) hours as needed for nausea or vomiting.   OneTouch Delica Lancets 78I Misc TEST BLOOD SUGAR BID   OneTouch Verio test strip Generic drug: glucose blood TEST BLOOD SUGAR BID   rOPINIRole 2 MG 24 hr tablet Commonly known as: REQUIP XL Take 2 mg by mouth at bedtime.   sertraline 50 MG tablet Commonly known as: ZOLOFT Take 1 tablet (50 mg total) by mouth daily.   simvastatin 40 MG tablet Commonly known as: ZOCOR Take 1 tablet (40 mg total) by mouth at bedtime.        Vitals:   08/09/19 1441 08/09/19 1856  BP: (!) 125/50 118/74  Pulse: 63 90  Resp: 20 (!) 24  Temp: 98.4 F (36.9 C) 98.4 F (36.9 C)  SpO2: 94% 93%    Skin clean, dry and intact without evidence of skin break down, no evidence of skin tears  noted. IV catheter discontinued intact. Site without signs and symptoms of complications. Dressing and pressure applied. Pt denies pain at this time. No complaints noted.  An After Visit Summary was printed and given to the patient. Patient escorted via [place], and D/C home via private auto.  [place] A Theresa Mills

## 2019-08-09 NOTE — Progress Notes (Signed)
[facility] [place] NAME: Brookelin Felber    MR#:  [id number]  DATE OF BIRTH:  12-17-1950  SUBJECTIVE:   Patient came in with generalized body aches has been having frequent falls at home. She has significant bilateral knee pain. Severe arthritis. Has Parkinson's and lives at home by herself.  Overall better than yday although feeling weak and  bodyaches REVIEW OF SYSTEMS:   Review of Systems  Constitutional: Positive for malaise/fatigue. Negative for chills, fever and weight loss.  HENT: Negative for ear discharge, ear pain and nosebleeds.   Eyes: Negative for blurred vision, pain and discharge.  Respiratory: Negative for sputum production, shortness of breath, wheezing and stridor.   Cardiovascular: Negative for chest pain, palpitations, orthopnea and PND.  Gastrointestinal: Negative for abdominal pain, diarrhea, nausea and vomiting.  Genitourinary: Negative for frequency and urgency.  Musculoskeletal: Positive for back pain, falls and joint pain.  Neurological: Positive for weakness. Negative for sensory change, speech change and focal weakness.  Psychiatric/Behavioral: Negative for depression and hallucinations. The patient is not nervous/anxious.    Tolerating Diet: yes Tolerating PT: rehab  DRUG ALLERGIES:   Allergies  Allergen Reactions  . Codeine Other (See Comments)    HALLUCINATIONS  . Ephadrene [Cholestatin] Other (See Comments)    HYPER AND NERVOUS  . Other Other (See Comments)    HYPER  HYPER AND NERVOUS  . Valium [Diazepam] Other (See Comments)    OVERLY SENSITIVE/TO STRONG    VITALS:  Blood pressure 136/68, pulse 60, temperature 98.6 F (37 C), resp. rate 16, height 5\' 4"  (1.626 m), weight 82.6 kg, SpO2 94 %.  PHYSICAL EXAMINATION:   Physical Exam  GENERAL:  69 y.o.-year-old patient lying in the bed with no acute distress. Morbid obesity EYES: Pupils equal, round, reactive to light and accommodation. No  scleral icterus.   HEENT: Head atraumatic, normocephalic. Oropharynx and nasopharynx clear. Bruise over the chin NECK:  Supple, no jugular venous distention. No thyroid enlargement, no tenderness.  LUNGS: Normal breath sounds bilaterally, no wheezing, rales, rhonchi. No use of accessory muscles of respiration.  CARDIOVASCULAR: S1, S2 normal. No murmurs, rubs, or gallops.  ABDOMEN: Soft, nontender, nondistended. Bowel sounds present. No organomegaly or mass.  EXTREMITIES: No cyanosis, clubbing or edema b/l.   Bilateral significant arthritic knees NEUROLOGIC: Cranial nerves II through XII are intact. No focal Motor or sensory deficits b/l. Generalized weakness PSYCHIATRIC:  patient is alert and oriented x 3.  SKIN: No obvious rash, lesion, or ulcer.  Bruise + LABORATORY PANEL:  CBC Recent Labs  Lab 08/06/19 0454  WBC 7.7  HGB 12.4  HCT 36.8  PLT 122*    Chemistries  Recent Labs  Lab 08/06/19 0454  NA 145  K 3.9  CL 113*  CO2 27  GLUCOSE 103*  BUN 31*  CREATININE 0.69  CALCIUM 8.2*   Cardiac Enzymes No results for input(s): TROPONINI in the last 168 hours. RADIOLOGY:  No results found. ASSESSMENT AND PLAN:   JOSILYNN LOSH is a 69 y.o. female with medical history significant for diabetes mellitus, GERD, anxiety, sleep apnea, Parkinson's disease and hypothyroidism by EMS generalized weakness, joint pain, fatigue and multiple falls at home.  Patient normally ambulates with a walker and states that all the time she had fallen at home she had set the walker to the side to do something and then she fell.  Acute rhabdomyolysis suspect from frequent falls with severe bilateral knee DJD -Patient is  s/p multiple falls at home and presents to the ER for evaluation of generalized weakness and diffuse pain -She has elevated total CK levels -continue to hydrate patient for another day--feels weak -CK levels 844--- IV fluids-- 283--Cont IVF--CK in am -pt encouraged to take oral  fluids -patient denies any chest pain. No history of coronary artery disease. Mild elevated troponin.   Frequent falls -Patient with a history of Parkinson's disease and severe osteoarthritis presents with presents for evaluation of generalized pain and weakness -She has had multiple falls at home -Will request PT evaluation-- recommends rehab--d/w TOC for d/c planning -Place patient on fall precautions -x-ray left foot--Mildly displaced fourth proximal phalangeal fracture. -X-ray right knee severe DJD  Anxiety disorder-chronic Continue alprazolam  Parkinson's disease Continue Sinemet and Ropinirole  Depression Stable Continue Celexa, Remeron and Zoloft  Diabetes mellitus, well-controlled resume Metformin  Place patient on sliding scale coverage with insulin Maintain consistent carbohydrate diet  Hypokalemia Supplement potassium  Hypothyroidism Continue Synthroid  DVT prophylaxis: Lovenox Code Status: Full code Family Communication: pt has been communicating with her family sister Disposition Plan:  physical therapy recommending rehab. TOC for discharge planning Consults called: Physical therapy  Status is: Inpatient  Remains inpatient appropriate because:Unsafe d/c plan TOC to discuss d/c plans  Dispo: The patient is from: Home              Anticipated d/c is to: SNF              Anticipated d/c date is:1-2              Patient currently is not stable.  Need to have safe discharge plan.  TOC working on discharge plan. Cont IV hydration, CK levels trending down slowly. Continue IV hydration for today.     TOTAL TIME TAKING CARE OF THIS PATIENT: *25* minutes.  >50% time spent on counselling and coordination of care  Note: This dictation was prepared with Dragon dictation along with smaller phrase technology. Any transcriptional errors that result from this process are unintentional.  Fritzi Mandes M.D    [facility]   CC: Primary care  physician; Ronnell Freshwater, NPPatient ID: [name], female   DOB: 12/21/1950, 69 y.o.   MRN: [id number]

## 2019-08-09 NOTE — Discharge Summary (Signed)
[facility] [place] NAME: Theresa Mills    MR#:  [id number]  DATE OF BIRTH:  03-11-1950  DATE OF ADMISSION:  08/05/2019 ADMITTING PHYSICIAN: Fritzi Mandes, MD  DATE OF DISCHARGE: 08/09/2019  PRIMARY CARE PHYSICIAN: Ronnell Freshwater, NP    ADMISSION DIAGNOSIS:  Rhabdomyolysis [M62.82] Weakness [R53.1] NSTEMI (non-ST elevated myocardial infarction) ([place]) [I21.4] Fall, initial encounter [W19.XXXA]  DISCHARGE DIAGNOSIS:  Acute Rhabdomyolysis post fall  SECONDARY DIAGNOSIS:   Past Medical History:  Diagnosis Date  . Anxiety   . Diabetes ([place])   . Difficult intravenous access   . GERD (gastroesophageal reflux disease)   . HBP (high blood pressure)   . Hyperlipidemia   . Osteoarthritis   . Parkinson disease ([place])   . Sleep apnea   . Thyroid disease     HOSPITAL COURSE:   Theresa Spano Goochis a 69 y.o.femalewith medical history significant fordiabetes mellitus, GERD, anxiety, sleep apnea, Parkinson's disease and hypothyroidism by EMS generalized weakness,joint pain, fatigue and multiple falls at home. Patient normally ambulates with a walker and states that all the time she had fallen at home she had set the walker to the side to do something and then she fell.  Acute rhabdomyolysis suspect from frequent falls with severe bilateral knee DJD -Patient is s/pmultiple falls at home and presents to the ER for evaluation of generalized weakness and diffuse pain -CK levels 844--- IV fluids-- 283--Cont IVF-- 56 today -pt encouraged to take oral fluids -patient denies any chest pain. No history of coronary artery disease. Mild elevated troponin.   Frequent falls -Patient with a history of Parkinson's disease and severe osteoarthritis presents with presents for evaluation of generalized pain and weakness -She has had multiple falls at home - PT evaluation-- recommends rehab--d/w TOC for d/c planning -x-ray left foot--Mildly displaced  fourth proximal phalangeal fracture. -X-ray bilateral knee severe DJD  Anxiety disorder-chronic Continue alprazolam  Parkinson's disease Continue Sinemetand Ropinirole  Depression Stable Continue Celexa, Remeron and Zoloft  Diabetes mellitus, well-controlled resume Metformin  Place patient on sliding scale coverage with insulin Maintain consistent carbohydrate diet  Hypokalemia Supplement potassium  Hypothyroidism Continue Synthroid  DVT prophylaxis:Lovenox Code Status:Full code Family Communication:pt has been communicating with her family sister Disposition Plan: physical therapy recommending rehab. TOC for discharge planning Consults called:Physical therapy  Status is: Inpatient  Remains inpatient appropriate because:Unsafe d/c plan TOC to discuss d/c plans  Dispo: The patient is from: Home  Anticipated d/c is to: SNF  Anticipated d/c date [facility]  Patient currently is stable.    CONSULTS OBTAINED:    DRUG ALLERGIES:   Allergies  Allergen Reactions  . Codeine Other (See Comments)    HALLUCINATIONS  . Ephadrene [Cholestatin] Other (See Comments)    HYPER AND NERVOUS  . Other Other (See Comments)    HYPER  HYPER AND NERVOUS  . Valium [Diazepam] Other (See Comments)    OVERLY SENSITIVE/TO STRONG    DISCHARGE MEDICATIONS:   Allergies as of 08/09/2019      Reactions   Codeine Other (See Comments)   HALLUCINATIONS   Ephadrene [cholestatin] Other (See Comments)   HYPER AND NERVOUS   Other Other (See Comments)   HYPER HYPER AND NERVOUS   Valium [diazepam] Other (See Comments)   OVERLY SENSITIVE/TO STRONG      Medication List    TAKE these medications   ALPRAZolam 0.25 MG tablet Commonly known as: XANAX Take 1 tablet (0.25 mg total) by mouth  every morning. What changed:   medication strength  how much to take  how to take this  when to take this  additional  instructions   ALPRAZolam 0.5 MG tablet Commonly known as: XANAX Take 1 tablet (0.5 mg total) by mouth at bedtime as needed for anxiety. What changed: You were already taking a medication with the same name, and this prescription was added. Make sure you understand how and when to take each.   Calcium High Potency/Vitamin D 600-200 MG-UNIT Tabs Generic drug: Calcium Carbonate-Vitamin D Take 1 tablet by mouth daily.   carbidopa-levodopa 50-200 MG tablet Commonly known as: SINEMET CR Take 1 tablet by mouth 2 (two) times daily.   carbidopa-levodopa 25-100 MG tablet Commonly known as: SINEMET IR Take 2 tablets by mouth 4 (four) times daily.   celecoxib 200 MG capsule Commonly known as: CELEBREX Take 1 capsule (200 mg total) by mouth 2 (two) times daily as needed.   citalopram 40 MG tablet Commonly known as: CELEXA Take 40 mg by mouth daily.   docusate sodium 100 MG capsule Commonly known as: COLACE Take 1 capsule (100 mg total) by mouth 2 (two) times daily.   levothyroxine 175 MCG tablet Commonly known as: SYNTHROID Take 1 tablet (175 mcg total) by mouth daily.   melatonin 3 MG Tabs tablet Take by mouth.   metFORMIN 500 MG tablet Commonly known as: GLUCOPHAGE Take 500 mg by mouth 2 (two) times daily with a meal.   mirtazapine 7.5 MG tablet Commonly known as: REMERON Take 1 tablet (7.5 mg total) by mouth at bedtime.   MULTIVITAMIN WOMEN 50+ PO Take 1 tablet by mouth daily.   omeprazole 40 MG capsule Commonly known as: PRILOSEC Take 1 capsule (40 mg total) by mouth daily.   ondansetron 4 MG disintegrating tablet Commonly known as: ZOFRAN-ODT Take 1 tablet (4 mg total) by mouth every 8 (eight) hours as needed for nausea or vomiting.   OneTouch Delica Lancets 83T Misc TEST BLOOD SUGAR BID   OneTouch Verio test strip Generic drug: glucose blood TEST BLOOD SUGAR BID   rOPINIRole 2 MG 24 hr tablet Commonly known as: REQUIP XL Take 2 mg by mouth at bedtime.    sertraline 50 MG tablet Commonly known as: ZOLOFT Take 1 tablet (50 mg total) by mouth daily.   simvastatin 40 MG tablet Commonly known as: ZOCOR Take 1 tablet (40 mg total) by mouth at bedtime.       If you experience worsening of your admission symptoms, develop shortness of breath, life threatening emergency, suicidal or homicidal thoughts you must seek medical attention immediately by calling 911 or calling your MD immediately  if symptoms less severe.  You Must read complete instructions/literature along with all the possible adverse reactions/side effects for all the Medicines you take and that have been prescribed to you. Take any new Medicines after you have completely understood and accept all the possible adverse reactions/side effects.   Please note  You were cared for by a hospitalist during your hospital stay. If you have any questions about your discharge medications or the care you received while you were in the hospital after you are discharged, you can call the unit and asked to speak with the hospitalist on call if the hospitalist that took care of you is not available. Once you are discharged, your primary care physician will handle any further medical issues. Please note that NO REFILLS for any discharge medications will be authorized once you are discharged,  as it is imperative that you return to your primary care physician (or establish a relationship with a primary care physician if you do not have one) for your aftercare needs so that they can reassess your need for medications and monitor your lab values. Today   SUBJECTIVE   Headache today  VITAL SIGNS:  Blood pressure 136/68, pulse 60, temperature 98.6 F (37 C), resp. rate 16, height 5\' 4"  (1.626 m), weight 82.6 kg, SpO2 94 %.  I/O:    Intake/Output Summary (Last 24 hours) at 08/09/2019 1124 Last data filed at 08/09/2019 0900 Gross per 24 hour  Intake 1449.64 ml  Output 1350 ml  Net 99.64 ml     PHYSICAL EXAMINATION:  GENERAL:  69 y.o.-year-old patient lying in the bed with no acute distress. Obese EYES: Pupils equal, round, reactive to light and accommodation. No scleral icterus.  HEENT: Head atraumatic, normocephalic. Oropharynx and nasopharynx clear.  NECK:  Supple, no jugular venous distention. No thyroid enlargement, no tenderness.  LUNGS: Normal breath sounds bilaterally, no wheezing, rales,rhonchi or crepitation. No use of accessory muscles of respiration.  CARDIOVASCULAR: S1, S2 normal. No murmurs, rubs, or gallops.  ABDOMEN: Soft, non-tender, non-distended. Bowel sounds present. No organomegaly or mass.  EXTREMITIES: bilateral DJD NEUROLOGIC: Cranial nerves II through XII are intact. Muscle strength 5/5 in all extremities. Sensation intact. Gait not checked. Weak+ PSYCHIATRIC: The patient is alert and oriented x 3.  SKIN: No obvious rash, lesion, or ulcer.   DATA REVIEW:   CBC  Recent Labs  Lab 08/06/19 0454  WBC 7.7  HGB 12.4  HCT 36.8  PLT 122*    Chemistries  Recent Labs  Lab 08/06/19 0454  NA 145  K 3.9  CL 113*  CO2 27  GLUCOSE 103*  BUN 31*  CREATININE 0.69  CALCIUM 8.2*    Microbiology Results   Recent Results (from the past 240 hour(s))  SARS Coronavirus 2 by RT PCR (hospital order, performed in [facility] hospital lab) Nasopharyngeal Nasopharyngeal Swab     Status: None   Collection Time: 08/05/19  4:07 PM   Specimen: Nasopharyngeal Swab  Result Value Ref Range Status   SARS Coronavirus 2 NEGATIVE NEGATIVE Final    Comment: (NOTE) SARS-CoV-2 target nucleic acids are NOT DETECTED.  The SARS-CoV-2 RNA is generally detectable in upper and lower respiratory specimens during the acute phase of infection. The lowest concentration of SARS-CoV-2 viral copies this assay can detect is 250 copies / mL. A negative result does not preclude SARS-CoV-2 infection and should not be used as the sole basis for treatment or other patient management  decisions.  A negative result may occur with improper specimen collection / handling, submission of specimen other than nasopharyngeal swab, presence of viral mutation(s) within the areas targeted by this assay, and inadequate number of viral copies (<250 copies / mL). A negative result must be combined with clinical observations, patient history, and epidemiological information.  Fact Sheet for Patients:   StrictlyIdeas.no  Fact Sheet for Healthcare Providers: BankingDealers.co.za  This test is not yet approved or  cleared by the [place] FDA and has been authorized for detection and/or diagnosis of SARS-CoV-2 by FDA under an Emergency Use Authorization (EUA).  This EUA will remain in effect (meaning this test can be used) for the duration of the COVID-19 declaration under Section 564(b)(1) of the Act, 21 U.S.C. section 360bbb-3(b)(1), unless the authorization is terminated or revoked sooner.  Performed at [facility], [address],  [place], [place] [address]     RADIOLOGY:  No results found.   CODE STATUS:     Code Status Orders  (From admission, onward)         Start     Ordered   08/05/19 1222  Full code  Continuous        08/05/19 1224        Code Status History    Date Active Date Inactive Code Status Order ID Comments User Context   06/12/2017 1413 06/14/2017 1911 DNR [id number]  Saundra Shelling, MD Inpatient   06/10/2017 2338 06/12/2017 1412 Full Code [id number]  Lance Coon, MD Inpatient   Advance Care Planning Activity    Advance Directive Documentation     Most Recent Value  Type of Advance Directive Healthcare Power of Attorney, Living will  Pre-existing out of facility DNR order (yellow form or pink MOST form) --  "MOST" Form in Place? --       TOTAL TIME TAKING CARE OF THIS PATIENT: *35* minutes.    Fritzi Mandes M.D  [facility]    CC: Primary care physician; Ronnell Freshwater, NP

## 2019-08-09 NOTE — TOC Transition Note (Signed)
Transition of Care [facility]) - CM/SW Discharge Note   Patient Details  Name: Theresa Mills MRN: [id number] Date of Birth: 1950-06-09  [facility] CM/SW Contact:  Harriet Masson, RN Phone [place] 08/09/2019, 3:39 PM   Clinical Narrative:     RN spoke with pt today concerning discharge plans. Pt will be transferred to [facility] floor room 42A confirmed by the charge nurse at the facility Arbie Cookey Wood,[phone]). Note the initial arrangements was with [facility] [phone]). Pt has agreed and ready for the transfer. Team is aware and transportation was called. RN has spoken with her friend [name] [phone] with this update. Medical necessity and face-sheet in transport packet.  No other needs at this time.  Case close.   Final next level of care: [facility]) Barriers to Discharge: No Barriers Identified   Patient Goals and CMS Choice   CMS Medicare.gov Compare Post Acute Care list provided to:: Patient Choice offered to / list presented to : Patient  Discharge Placement                       Discharge Plan and Services   Discharge Planning Services: CM Consult                                 Social Determinants of Health (SDOH) Interventions     Readmission Risk Interventions No flowsheet data found.

## 2019-08-11 DIAGNOSIS — M6282 Rhabdomyolysis: Secondary | ICD-10-CM | POA: Diagnosis not present

## 2019-08-11 DIAGNOSIS — Z9181 History of falling: Secondary | ICD-10-CM | POA: Diagnosis not present

## 2019-08-11 DIAGNOSIS — R531 Weakness: Secondary | ICD-10-CM | POA: Diagnosis not present

## 2019-08-11 DIAGNOSIS — W19XXXD Unspecified fall, subsequent encounter: Secondary | ICD-10-CM | POA: Diagnosis not present

## 2019-08-18 ENCOUNTER — Telehealth: Payer: Self-pay

## 2019-08-18 NOTE — Telephone Encounter (Signed)
Hospital follow up. Tried calling pt several times with no answer, need to follow up with pt regarding hospital discharge. Beth

## 2019-08-19 ENCOUNTER — Emergency Department: Payer: Medicare Other

## 2019-08-19 ENCOUNTER — Inpatient Hospital Stay
Admission: EM | Admit: 2019-08-19 | Discharge: 2019-09-03 | DRG: 871 | Disposition: A | Discharge status: Skilled Nursing Facility | Payer: Medicare Other | Source: Skilled Nursing Facility | Attending: Internal Medicine | Admitting: Internal Medicine

## 2019-08-19 ENCOUNTER — Other Ambulatory Visit: Payer: Self-pay

## 2019-08-19 ENCOUNTER — Encounter: Payer: Self-pay | Admitting: Emergency Medicine

## 2019-08-19 DIAGNOSIS — M4646 Discitis, unspecified, lumbar region: Secondary | ICD-10-CM | POA: Diagnosis present

## 2019-08-19 DIAGNOSIS — G4733 Obstructive sleep apnea (adult) (pediatric): Secondary | ICD-10-CM | POA: Diagnosis present

## 2019-08-19 DIAGNOSIS — Z452 Encounter for adjustment and management of vascular access device: Secondary | ICD-10-CM | POA: Diagnosis not present

## 2019-08-19 DIAGNOSIS — F0280 Dementia in other diseases classified elsewhere without behavioral disturbance: Secondary | ICD-10-CM | POA: Diagnosis present

## 2019-08-19 DIAGNOSIS — N1330 Unspecified hydronephrosis: Secondary | ICD-10-CM | POA: Diagnosis present

## 2019-08-19 DIAGNOSIS — E1169 Type 2 diabetes mellitus with other specified complication: Secondary | ICD-10-CM | POA: Diagnosis present

## 2019-08-19 DIAGNOSIS — K219 Gastro-esophageal reflux disease without esophagitis: Secondary | ICD-10-CM | POA: Diagnosis present

## 2019-08-19 DIAGNOSIS — M419 Scoliosis, unspecified: Secondary | ICD-10-CM | POA: Diagnosis present

## 2019-08-19 DIAGNOSIS — A419 Sepsis, unspecified organism: Secondary | ICD-10-CM | POA: Diagnosis present

## 2019-08-19 DIAGNOSIS — Z888 Allergy status to other drugs, medicaments and biological substances status: Secondary | ICD-10-CM

## 2019-08-19 DIAGNOSIS — Z20822 Contact with and (suspected) exposure to covid-19: Secondary | ICD-10-CM | POA: Diagnosis present

## 2019-08-19 DIAGNOSIS — Z6829 Body mass index (BMI) 29.0-29.9, adult: Secondary | ICD-10-CM

## 2019-08-19 DIAGNOSIS — M4626 Osteomyelitis of vertebra, lumbar region: Secondary | ICD-10-CM | POA: Diagnosis present

## 2019-08-19 DIAGNOSIS — D649 Anemia, unspecified: Secondary | ICD-10-CM | POA: Diagnosis not present

## 2019-08-19 DIAGNOSIS — I959 Hypotension, unspecified: Secondary | ICD-10-CM | POA: Diagnosis present

## 2019-08-19 DIAGNOSIS — M1990 Unspecified osteoarthritis, unspecified site: Secondary | ICD-10-CM | POA: Diagnosis not present

## 2019-08-19 DIAGNOSIS — N10 Acute tubulo-interstitial nephritis: Secondary | ICD-10-CM | POA: Diagnosis not present

## 2019-08-19 DIAGNOSIS — E890 Postprocedural hypothyroidism: Secondary | ICD-10-CM | POA: Diagnosis present

## 2019-08-19 DIAGNOSIS — G9341 Metabolic encephalopathy: Secondary | ICD-10-CM | POA: Diagnosis present

## 2019-08-19 DIAGNOSIS — E440 Moderate protein-calorie malnutrition: Secondary | ICD-10-CM | POA: Diagnosis present

## 2019-08-19 DIAGNOSIS — S32040A Wedge compression fracture of fourth lumbar vertebra, initial encounter for closed fracture: Secondary | ICD-10-CM | POA: Diagnosis not present

## 2019-08-19 DIAGNOSIS — E119 Type 2 diabetes mellitus without complications: Secondary | ICD-10-CM

## 2019-08-19 DIAGNOSIS — B964 Proteus (mirabilis) (morganii) as the cause of diseases classified elsewhere: Secondary | ICD-10-CM | POA: Diagnosis present

## 2019-08-19 DIAGNOSIS — A4159 Other Gram-negative sepsis: Principal | ICD-10-CM | POA: Diagnosis present

## 2019-08-19 DIAGNOSIS — E872 Acidosis: Secondary | ICD-10-CM | POA: Diagnosis present

## 2019-08-19 DIAGNOSIS — I10 Essential (primary) hypertension: Secondary | ICD-10-CM | POA: Diagnosis present

## 2019-08-19 DIAGNOSIS — N136 Pyonephrosis: Secondary | ICD-10-CM | POA: Diagnosis present

## 2019-08-19 DIAGNOSIS — L89152 Pressure ulcer of sacral region, stage 2: Secondary | ICD-10-CM | POA: Diagnosis present

## 2019-08-19 DIAGNOSIS — R7881 Bacteremia: Secondary | ICD-10-CM | POA: Diagnosis not present

## 2019-08-19 DIAGNOSIS — M4640 Discitis, unspecified, site unspecified: Secondary | ICD-10-CM | POA: Diagnosis present

## 2019-08-19 DIAGNOSIS — R296 Repeated falls: Secondary | ICD-10-CM | POA: Diagnosis present

## 2019-08-19 DIAGNOSIS — M48061 Spinal stenosis, lumbar region without neurogenic claudication: Secondary | ICD-10-CM | POA: Diagnosis not present

## 2019-08-19 DIAGNOSIS — G061 Intraspinal abscess and granuloma: Secondary | ICD-10-CM | POA: Diagnosis present

## 2019-08-19 DIAGNOSIS — R0902 Hypoxemia: Secondary | ICD-10-CM | POA: Diagnosis not present

## 2019-08-19 DIAGNOSIS — N319 Neuromuscular dysfunction of bladder, unspecified: Secondary | ICD-10-CM | POA: Diagnosis present

## 2019-08-19 DIAGNOSIS — G20 Parkinson's disease: Secondary | ICD-10-CM | POA: Diagnosis not present

## 2019-08-19 DIAGNOSIS — I700 Atherosclerosis of aorta: Secondary | ICD-10-CM | POA: Diagnosis present

## 2019-08-19 DIAGNOSIS — R7401 Elevation of levels of liver transaminase levels: Secondary | ICD-10-CM

## 2019-08-19 DIAGNOSIS — M4856XA Collapsed vertebra, not elsewhere classified, lumbar region, initial encounter for fracture: Secondary | ICD-10-CM | POA: Diagnosis present

## 2019-08-19 DIAGNOSIS — N179 Acute kidney failure, unspecified: Secondary | ICD-10-CM | POA: Diagnosis not present

## 2019-08-19 DIAGNOSIS — E785 Hyperlipidemia, unspecified: Secondary | ICD-10-CM | POA: Diagnosis not present

## 2019-08-19 DIAGNOSIS — R338 Other retention of urine: Secondary | ICD-10-CM | POA: Diagnosis present

## 2019-08-19 DIAGNOSIS — Z9181 History of falling: Secondary | ICD-10-CM | POA: Diagnosis not present

## 2019-08-19 DIAGNOSIS — R410 Disorientation, unspecified: Secondary | ICD-10-CM | POA: Diagnosis not present

## 2019-08-19 DIAGNOSIS — R918 Other nonspecific abnormal finding of lung field: Secondary | ICD-10-CM | POA: Diagnosis not present

## 2019-08-19 DIAGNOSIS — E039 Hypothyroidism, unspecified: Secondary | ICD-10-CM | POA: Diagnosis present

## 2019-08-19 DIAGNOSIS — E559 Vitamin D deficiency, unspecified: Secondary | ICD-10-CM | POA: Diagnosis not present

## 2019-08-19 DIAGNOSIS — G4700 Insomnia, unspecified: Secondary | ICD-10-CM | POA: Diagnosis present

## 2019-08-19 DIAGNOSIS — S32030A Wedge compression fracture of third lumbar vertebra, initial encounter for closed fracture: Secondary | ICD-10-CM | POA: Diagnosis not present

## 2019-08-19 DIAGNOSIS — S32000A Wedge compression fracture of unspecified lumbar vertebra, initial encounter for closed fracture: Secondary | ICD-10-CM | POA: Diagnosis present

## 2019-08-19 DIAGNOSIS — N12 Tubulo-interstitial nephritis, not specified as acute or chronic: Secondary | ICD-10-CM | POA: Diagnosis not present

## 2019-08-19 DIAGNOSIS — M47816 Spondylosis without myelopathy or radiculopathy, lumbar region: Secondary | ICD-10-CM | POA: Diagnosis not present

## 2019-08-19 DIAGNOSIS — R3129 Other microscopic hematuria: Secondary | ICD-10-CM | POA: Diagnosis present

## 2019-08-19 DIAGNOSIS — I509 Heart failure, unspecified: Secondary | ICD-10-CM | POA: Diagnosis not present

## 2019-08-19 DIAGNOSIS — M6282 Rhabdomyolysis: Secondary | ICD-10-CM | POA: Diagnosis not present

## 2019-08-19 DIAGNOSIS — G2581 Restless legs syndrome: Secondary | ICD-10-CM | POA: Diagnosis not present

## 2019-08-19 DIAGNOSIS — M1710 Unilateral primary osteoarthritis, unspecified knee: Secondary | ICD-10-CM | POA: Diagnosis present

## 2019-08-19 DIAGNOSIS — F329 Major depressive disorder, single episode, unspecified: Secondary | ICD-10-CM | POA: Diagnosis not present

## 2019-08-19 DIAGNOSIS — R000 Tachycardia, unspecified: Secondary | ICD-10-CM | POA: Diagnosis present

## 2019-08-19 DIAGNOSIS — R069 Unspecified abnormalities of breathing: Secondary | ICD-10-CM | POA: Diagnosis not present

## 2019-08-19 DIAGNOSIS — R0689 Other abnormalities of breathing: Secondary | ICD-10-CM | POA: Diagnosis not present

## 2019-08-19 DIAGNOSIS — F419 Anxiety disorder, unspecified: Secondary | ICD-10-CM | POA: Diagnosis present

## 2019-08-19 DIAGNOSIS — K5900 Constipation, unspecified: Secondary | ICD-10-CM | POA: Diagnosis not present

## 2019-08-19 DIAGNOSIS — M609 Myositis, unspecified: Secondary | ICD-10-CM | POA: Diagnosis present

## 2019-08-19 DIAGNOSIS — Z7401 Bed confinement status: Secondary | ICD-10-CM | POA: Diagnosis not present

## 2019-08-19 DIAGNOSIS — R339 Retention of urine, unspecified: Secondary | ICD-10-CM | POA: Diagnosis not present

## 2019-08-19 DIAGNOSIS — B9689 Other specified bacterial agents as the cause of diseases classified elsewhere: Secondary | ICD-10-CM | POA: Diagnosis not present

## 2019-08-19 DIAGNOSIS — K7689 Other specified diseases of liver: Secondary | ICD-10-CM | POA: Diagnosis not present

## 2019-08-19 DIAGNOSIS — Z823 Family history of stroke: Secondary | ICD-10-CM

## 2019-08-19 DIAGNOSIS — Z885 Allergy status to narcotic agent status: Secondary | ICD-10-CM

## 2019-08-19 DIAGNOSIS — M2550 Pain in unspecified joint: Secondary | ICD-10-CM | POA: Diagnosis not present

## 2019-08-19 DIAGNOSIS — R6520 Severe sepsis without septic shock: Secondary | ICD-10-CM | POA: Diagnosis present

## 2019-08-19 DIAGNOSIS — F339 Major depressive disorder, recurrent, unspecified: Secondary | ICD-10-CM | POA: Diagnosis not present

## 2019-08-19 DIAGNOSIS — E1165 Type 2 diabetes mellitus with hyperglycemia: Secondary | ICD-10-CM

## 2019-08-19 DIAGNOSIS — W19XXXA Unspecified fall, initial encounter: Secondary | ICD-10-CM | POA: Diagnosis present

## 2019-08-19 DIAGNOSIS — Z825 Family history of asthma and other chronic lower respiratory diseases: Secondary | ICD-10-CM

## 2019-08-19 DIAGNOSIS — M5126 Other intervertebral disc displacement, lumbar region: Secondary | ICD-10-CM | POA: Diagnosis not present

## 2019-08-19 DIAGNOSIS — R4182 Altered mental status, unspecified: Secondary | ICD-10-CM | POA: Diagnosis not present

## 2019-08-19 DIAGNOSIS — Z7984 Long term (current) use of oral hypoglycemic drugs: Secondary | ICD-10-CM

## 2019-08-19 DIAGNOSIS — M549 Dorsalgia, unspecified: Secondary | ICD-10-CM | POA: Diagnosis not present

## 2019-08-19 DIAGNOSIS — R911 Solitary pulmonary nodule: Secondary | ICD-10-CM | POA: Diagnosis not present

## 2019-08-19 DIAGNOSIS — Z8249 Family history of ischemic heart disease and other diseases of the circulatory system: Secondary | ICD-10-CM

## 2019-08-19 DIAGNOSIS — M4317 Spondylolisthesis, lumbosacral region: Secondary | ICD-10-CM | POA: Diagnosis not present

## 2019-08-19 DIAGNOSIS — R531 Weakness: Secondary | ICD-10-CM | POA: Diagnosis not present

## 2019-08-19 DIAGNOSIS — K429 Umbilical hernia without obstruction or gangrene: Secondary | ICD-10-CM | POA: Diagnosis not present

## 2019-08-19 DIAGNOSIS — R945 Abnormal results of liver function studies: Secondary | ICD-10-CM | POA: Diagnosis not present

## 2019-08-19 DIAGNOSIS — N390 Urinary tract infection, site not specified: Secondary | ICD-10-CM | POA: Diagnosis not present

## 2019-08-19 DIAGNOSIS — K6812 Psoas muscle abscess: Secondary | ICD-10-CM | POA: Diagnosis present

## 2019-08-19 DIAGNOSIS — Z79899 Other long term (current) drug therapy: Secondary | ICD-10-CM

## 2019-08-19 DIAGNOSIS — N132 Hydronephrosis with renal and ureteral calculous obstruction: Secondary | ICD-10-CM | POA: Diagnosis not present

## 2019-08-19 DIAGNOSIS — Z79890 Hormone replacement therapy (postmenopausal): Secondary | ICD-10-CM

## 2019-08-19 DIAGNOSIS — Z833 Family history of diabetes mellitus: Secondary | ICD-10-CM

## 2019-08-19 DIAGNOSIS — G062 Extradural and subdural abscess, unspecified: Secondary | ICD-10-CM | POA: Diagnosis not present

## 2019-08-19 LAB — URINALYSIS, COMPLETE (UACMP) WITH MICROSCOPIC
Bilirubin Urine: NEGATIVE
Glucose, UA: NEGATIVE mg/dL
Ketones, ur: NEGATIVE mg/dL
Nitrite: NEGATIVE
Protein, ur: 100 mg/dL — AB
Specific Gravity, Urine: 1.011 (ref 1.005–1.030)
Squamous Epithelial / HPF: NONE SEEN (ref 0–5)
WBC, UA: >50 WBC/hpf — ABNORMAL HIGH (ref 0–5)
pH: 8 (ref 5.0–8.0)

## 2019-08-19 LAB — COMPREHENSIVE METABOLIC PANEL
ALT: 24 U/L (ref 0–44)
AST: 271 U/L — ABNORMAL HIGH (ref 15–41)
Albumin: 2.6 g/dL — ABNORMAL LOW (ref 3.5–5.0)
Alkaline Phosphatase: 137 U/L — ABNORMAL HIGH (ref 38–126)
Anion gap: 14 (ref 5–15)
BUN: 74 mg/dL — ABNORMAL HIGH (ref 8–23)
CO2: 19 mmol/L — ABNORMAL LOW (ref 22–32)
Calcium: 8.3 mg/dL — ABNORMAL LOW (ref 8.9–10.3)
Chloride: 104 mmol/L (ref 98–111)
Creatinine, Ser: 2.43 mg/dL — ABNORMAL HIGH (ref 0.44–1.00)
GFR calc Af Amer: 23 mL/min — ABNORMAL LOW (ref >60)
GFR calc non Af Amer: 19 mL/min — ABNORMAL LOW (ref >60)
Glucose, Bld: 125 mg/dL — ABNORMAL HIGH (ref 70–99)
Potassium: 4.8 mmol/L (ref 3.5–5.1)
Sodium: 137 mmol/L (ref 135–145)
Total Bilirubin: 1.2 mg/dL (ref 0.3–1.2)
Total Protein: 7.8 g/dL (ref 6.5–8.1)

## 2019-08-19 LAB — CBC WITH DIFFERENTIAL/PLATELET
Abs Immature Granulocytes: 0.18 10*3/uL — ABNORMAL HIGH (ref 0.00–0.07)
Basophils Absolute: 0.1 10*3/uL (ref 0.0–0.1)
Basophils Relative: 1 %
Eosinophils Absolute: 0 10*3/uL (ref 0.0–0.5)
Eosinophils Relative: 0 %
HCT: 42.1 % (ref 36.0–46.0)
Hemoglobin: 13.9 g/dL (ref 12.0–15.0)
Immature Granulocytes: 1 %
Lymphocytes Relative: 4 %
Lymphs Abs: 0.8 10*3/uL (ref 0.7–4.0)
MCH: 31.1 pg (ref 26.0–34.0)
MCHC: 33 g/dL (ref 30.0–36.0)
MCV: 94.2 fL (ref 80.0–100.0)
Monocytes Absolute: 0.3 10*3/uL (ref 0.1–1.0)
Monocytes Relative: 2 %
Neutro Abs: 16.5 10*3/uL — ABNORMAL HIGH (ref 1.7–7.7)
Neutrophils Relative %: 92 %
Platelets: 415 10*3/uL — ABNORMAL HIGH (ref 150–400)
RBC: 4.47 MIL/uL (ref 3.87–5.11)
RDW: 13.3 % (ref 11.5–15.5)
WBC: 17.9 10*3/uL — ABNORMAL HIGH (ref 4.0–10.5)
nRBC: 0 % (ref 0.0–0.2)

## 2019-08-19 LAB — LACTIC ACID, PLASMA: Lactic Acid, Venous: 2.5 mmol/L (ref 0.5–1.9)

## 2019-08-19 LAB — CK: Total CK: 22 U/L — ABNORMAL LOW (ref 38–234)

## 2019-08-19 LAB — GLUCOSE, CAPILLARY: Glucose-Capillary: 125 mg/dL — ABNORMAL HIGH (ref 70–99)

## 2019-08-19 LAB — PROCALCITONIN: Procalcitonin: 4.62 ng/mL

## 2019-08-19 LAB — TROPONIN I (HIGH SENSITIVITY): Troponin I (High Sensitivity): 28 ng/L — ABNORMAL HIGH (ref <18)

## 2019-08-19 MED ORDER — SODIUM CHLORIDE 0.9 % IV SOLN: INTRAVENOUS | Status: DC

## 2019-08-19 MED ORDER — INSULIN ASPART 100 UNIT/ML ~~LOC~~ SOLN: 0.0000 [IU] | Freq: Every day | SUBCUTANEOUS | Status: DC

## 2019-08-19 MED ORDER — HYDROCODONE-ACETAMINOPHEN 5-325 MG PO TABS
1.0000 | ORAL_TABLET | ORAL | Status: DC | PRN
  Administered 2019-08-20 – 2019-08-21 (×2): 2 via ORAL
  Administered 2019-08-22 – 2019-08-25 (×3): 1 via ORAL
  Administered 2019-08-26: 2 via ORAL
  Administered 2019-08-27: 1 via ORAL
  Administered 2019-08-28 (×2): 2 via ORAL
  Administered 2019-08-28 (×3): 1 via ORAL
  Administered 2019-08-29: 2 via ORAL
  Filled 2019-08-19 (×2): qty 1
  Filled 2019-08-19: qty 2
  Filled 2019-08-19: qty 1
  Filled 2019-08-19 (×5): qty 2
  Filled 2019-08-19 (×4): qty 1

## 2019-08-19 MED ORDER — INSULIN ASPART 100 UNIT/ML ~~LOC~~ SOLN
0.0000 [IU] | Freq: Three times a day (TID) | SUBCUTANEOUS | Status: DC
  Administered 2019-08-20 – 2019-08-22 (×2): 2 [IU] via SUBCUTANEOUS
  Administered 2019-08-22 – 2019-08-24 (×3): 3 [IU] via SUBCUTANEOUS
  Administered 2019-08-26 – 2019-08-27 (×2): 2 [IU] via SUBCUTANEOUS
  Administered 2019-08-27 – 2019-08-28 (×3): 3 [IU] via SUBCUTANEOUS
  Administered 2019-08-28: 7 [IU] via SUBCUTANEOUS
  Administered 2019-08-28 – 2019-08-29 (×2): 2 [IU] via SUBCUTANEOUS
  Administered 2019-08-29: 3 [IU] via SUBCUTANEOUS
  Administered 2019-08-30 (×2): 2 [IU] via SUBCUTANEOUS
  Administered 2019-08-31: 3 [IU] via SUBCUTANEOUS
  Administered 2019-08-31: 2 [IU] via SUBCUTANEOUS
  Administered 2019-08-31 – 2019-09-01 (×2): 3 [IU] via SUBCUTANEOUS
  Filled 2019-08-19 (×21): qty 1

## 2019-08-19 MED ORDER — ACETAMINOPHEN 325 MG PO TABS
650.0000 mg | ORAL_TABLET | Freq: Four times a day (QID) | ORAL | Status: DC | PRN
  Administered 2019-08-20 – 2019-09-01 (×4): 650 mg via ORAL
  Filled 2019-08-19 (×5): qty 2

## 2019-08-19 MED ORDER — SODIUM CHLORIDE 0.9 % IV SOLN
1.0000 g | Freq: Once | INTRAVENOUS | Status: AC
  Administered 2019-08-19: 1 g via INTRAVENOUS
  Filled 2019-08-19: qty 10

## 2019-08-19 MED ORDER — ONDANSETRON HCL 4 MG PO TABS: 4.0000 mg | ORAL_TABLET | Freq: Four times a day (QID) | ORAL | Status: DC | PRN

## 2019-08-19 MED ORDER — SODIUM CHLORIDE 0.9 % IV SOLN
1.0000 g | INTRAVENOUS | Status: DC
  Filled 2019-08-19: qty 10

## 2019-08-19 MED ORDER — ACETAMINOPHEN 650 MG RE SUPP: 650.0000 mg | Freq: Four times a day (QID) | RECTAL | Status: DC | PRN

## 2019-08-19 MED ORDER — SODIUM CHLORIDE 0.9 % IV SOLN: Freq: Once | INTRAVENOUS | Status: AC

## 2019-08-19 MED ORDER — ENOXAPARIN SODIUM 30 MG/0.3ML ~~LOC~~ SOLN
30.0000 mg | SUBCUTANEOUS | Status: DC
  Administered 2019-08-20: 30 mg via SUBCUTANEOUS
  Filled 2019-08-19 (×2): qty 0.3

## 2019-08-19 MED ORDER — ONDANSETRON HCL 4 MG/2ML IJ SOLN
4.0000 mg | Freq: Four times a day (QID) | INTRAMUSCULAR | Status: DC | PRN
  Filled 2019-08-19: qty 2

## 2019-08-19 MED ORDER — SODIUM CHLORIDE 0.9 % IV BOLUS
1000.0000 mL | Freq: Once | INTRAVENOUS | Status: AC
  Administered 2019-08-19: 1000 mL via INTRAVENOUS

## 2019-08-19 MED ORDER — SODIUM CHLORIDE 0.9 % IV BOLUS: 1000.0000 mL | Freq: Once | INTRAVENOUS | Status: DC

## 2019-08-19 NOTE — ED Notes (Signed)
[facility] called EMS for pt w/ original c/o difficulty breathing, pt found to be hypotensive on arrival.

## 2019-08-19 NOTE — ED Triage Notes (Signed)
Pt arrives via [facility] from [place]. EMS originally called out for breathing difficulty but when EMS arrived pt found to be hypotensive at 95/71. Pt also had BG of 163.  Pt has hx of parkinson's and dementia.

## 2019-08-19 NOTE — H&P (Signed)
History and Physical    Theresa Mills WRU:[id number] DOB: 1950/05/17 DOA: 08/19/2019  PCP: Ronnell Freshwater, NP   Patient coming from: [place] have personally briefly reviewed patient's old medical records in [place]  Chief Complaint: Shortness of breath, altered mental status  HPI: Theresa Mills is a 69 y.o. female with medical history significant for DM, GERD, anxiety, OSA, Parkinson's with dementia, hypothyroidism, frequent falls, brought in by EMS after home reported shortness of breath and altered mental status.  Patient complained of abdominal pain and neck pain but unable to get additional history due to history of dementia and altered mental status ED Course: On arrival, lethargic, afebrile, hypotensive at 83/41, pulse 90 respirations 24 O2 sat 93% on room air.  Blood work with WBC 18,000, lactic acid 2.5.  Urine with pyuria.  Creatinine 2.43 above baseline of 0.69.  Procalcitonin 4.62.  Troponin 28.  CK 22 CT head and C-spine with no acute injury CT abdomen and pelvis, showing distended urinary bladder with bilateral hydronephrosis and findings concerning for pyelonephritis.  Several nonobstructing left renal calculi.  No focal consolidation on CT chest.  EKG normal sinus rhythm.  Patient was bolused with IV fluids with improvement in BP to 120/56 of admission.  Started on IV Rocephin.  Hospitalist consultd for admission.  Review of Systems: Unable to obtain due to altered mental status   Past Medical History:  Diagnosis Date  . Anxiety   . Diabetes ([place])   . Difficult intravenous access   . GERD (gastroesophageal reflux disease)   . HBP (high blood pressure)   . Hyperlipidemia   . Osteoarthritis   . Parkinson disease ([place])   . Sleep apnea   . Thyroid disease     Past Surgical History:  Procedure Laterality Date  . ABDOMINAL HYSTERECTOMY    . CATARACT EXTRACTION Bilateral   . CHOLECYSTECTOMY    . COLONOSCOPY WITH PROPOFOL N/A 04/02/2015    Procedure: COLONOSCOPY WITH PROPOFOL;  Surgeon: Josefine Class, MD;  Location: [facility] ENDOSCOPY;  Service: Endoscopy;  Laterality: N/A;  . ESOPHAGOGASTRODUODENOSCOPY (EGD) WITH PROPOFOL N/A 04/02/2015   Procedure: ESOPHAGOGASTRODUODENOSCOPY (EGD) WITH PROPOFOL;  Surgeon: Josefine Class, MD;  Location: [facility] ENDOSCOPY;  Service: Endoscopy;  Laterality: N/A;  . GALLBLADDER SURGERY    . LAPAROSCOPIC HYSTERECTOMY    . THYROIDECTOMY Left   . TONSILLECTOMY    . TONSILLECTOMY Bilateral      reports that she has never smoked. She has never used smokeless tobacco. She reports that she does not drink alcohol and does not use drugs.  Allergies  Allergen Reactions  . Codeine Other (See Comments)    HALLUCINATIONS  . Ephadrene [Cholestatin] Other (See Comments)    HYPER AND NERVOUS  . Other Other (See Comments)    HYPER  HYPER AND NERVOUS  . Valium [Diazepam] Other (See Comments)    OVERLY SENSITIVE/TO STRONG    Family History  Problem Relation Age of Onset  . Asthma Father   . Cancer Sister   . Stroke Maternal Uncle   . Heart disease Maternal Uncle   . Diabetes Maternal Uncle       Prior to Admission medications   Medication Sig Start Date End Date Taking? Authorizing Provider  ALPRAZolam (XANAX) 0.25 MG tablet Take 1 tablet (0.25 mg total) by mouth every morning. 08/09/19   Fritzi Mandes, MD  ALPRAZolam Duanne Moron) 0.5 MG tablet Take 1 tablet (0.5 mg total) by mouth at bedtime as needed  for anxiety. 08/09/19   Fritzi Mandes, MD  Calcium Carbonate-Vitamin D (CALCIUM HIGH POTENCY/VITAMIN D) 600-200 MG-UNIT TABS Take 1 tablet by mouth daily.     [provider]  carbidopa-levodopa (SINEMET CR) 50-200 MG tablet Take 1 tablet by mouth 2 (two) times daily.  07/31/18   [provider]  carbidopa-levodopa (SINEMET IR) 25-100 MG tablet Take 2 tablets by mouth 4 (four) times daily. 07/25/19   [provider]  celecoxib (CELEBREX) 200 MG capsule Take 1 capsule (200 mg total)  by mouth 2 (two) times daily as needed. 07/29/19   Ronnell Freshwater, NP  citalopram (CELEXA) 40 MG tablet Take 40 mg by mouth daily.    [provider]  docusate sodium (COLACE) 100 MG capsule Take 1 capsule (100 mg total) by mouth 2 (two) times daily. 08/09/19   Fritzi Mandes, MD  glucose blood (ONETOUCH VERIO) test strip TEST BLOOD SUGAR BID 01/27/16   [provider]  levothyroxine (SYNTHROID) 175 MCG tablet Take 1 tablet (175 mcg total) by mouth daily. 02/10/19   Ronnell Freshwater, NP  Melatonin 3 MG TABS Take by mouth.    [provider]  metFORMIN (GLUCOPHAGE) 500 MG tablet Take 500 mg by mouth 2 (two) times daily with a meal.     [provider]  mirtazapine (REMERON) 7.5 MG tablet Take 1 tablet (7.5 mg total) by mouth at bedtime. 02/10/19   Ronnell Freshwater, NP  Multiple Vitamins-Minerals (MULTIVITAMIN WOMEN 50+ PO) Take 1 tablet by mouth daily.     [provider]  omeprazole (PRILOSEC) 40 MG capsule Take 1 capsule (40 mg total) by mouth daily. 06/12/19   Ronnell Freshwater, NP  ondansetron (ZOFRAN-ODT) 4 MG disintegrating tablet Take 1 tablet (4 mg total) by mouth every 8 (eight) hours as needed for nausea or vomiting. 06/05/19   Ronnell Freshwater, NP  ONETOUCH DELICA LANCETS 51V MISC TEST BLOOD SUGAR BID 01/27/16   [provider]  rOPINIRole (REQUIP XL) 2 MG 24 hr tablet Take 2 mg by mouth at bedtime.     [provider]  sertraline (ZOLOFT) 50 MG tablet Take 1 tablet (50 mg total) by mouth daily. 02/10/19   Ronnell Freshwater, NP  simvastatin (ZOCOR) 40 MG tablet Take 1 tablet (40 mg total) by mouth at bedtime. 04/10/19   Ronnell Freshwater, NP    Physical Exam: Vitals:   08/19/19 1908 08/19/19 1915 08/19/19 1937 08/19/19 1945  BP:  (!) 96/52 (!) 107/60 (!) 101/54  Pulse:      Resp:   (!) 29 16  Temp:      TempSrc:      SpO2:  100% 100% 100%  Weight: 81.6 kg     Height: 5\' 4"  (1.626 m)        Vitals:   08/19/19 1908 08/19/19  1915 08/19/19 1937 08/19/19 1945  BP:  (!) 96/52 (!) 107/60 (!) 101/54  Pulse:      Resp:   (!) 29 16  Temp:      TempSrc:      SpO2:  100% 100% 100%  Weight: 81.6 kg     Height: 5\' 4"  (1.626 m)         Constitutional: Lethargic but arousable when able to answer questions appropriately but will readily fall back asleep.  HEENT:      Head: Normocephalic and atraumatic.         Eyes: PERLA, EOMI, Conjunctivae are normal. Sclera is non-icteric.  Mouth/Throat: Mucous membranes are moist.       Neck: Supple with no signs of meningismus. Cardiovascular: Regular rate and rhythm. No murmurs, gallops, or rubs. 2+ symmetrical distal pulses are present . No JVD. No LE edema Respiratory: Respiratory effort increased.Lungs sounds clear bilaterally. No wheezes, crackles, or rhonchi.  Gastrointestinal: Mild suprapubic tenderness, and non distended with positive bowel sounds. No rebound or guarding. Genitourinary: Foley catheter, urine with pus and gross hematuria Musculoskeletal: Nontender with normal range of motion in all extremities. No cyanosis, or erythema of extremities. Neurologic:  Face is symmetric. Moving all extremities. No gross focal neurologic deficits . Skin: Skin is warm, dry.  No rash or ulcers Psychiatric: Lethargic and drowsy  Labs on Admission: I have personally reviewed following labs and imaging studies  CBC: Recent Labs  Lab 08/19/19 1858  WBC 17.9*  NEUTROABS 16.5*  HGB 13.9  HCT 42.1  MCV 94.2  PLT 443*   Basic Metabolic Panel: Recent Labs  Lab 08/19/19 1858  NA 137  K 4.8  CL 104  CO2 19*  GLUCOSE 125*  BUN 74*  CREATININE 2.43*  CALCIUM 8.3*   GFR: Estimated Creatinine Clearance: 22.3 mL/min (A) (by C-G formula based on SCr of 2.43 mg/dL (H)). Liver Function Tests: Recent Labs  Lab 08/19/19 1858  AST 271*  ALT 24  ALKPHOS 137*  BILITOT 1.2  PROT 7.8  ALBUMIN 2.6*   No results for input(s): LIPASE, AMYLASE in the last 168 hours. No  results for input(s): AMMONIA in the last 168 hours. Coagulation Profile: No results for input(s): INR, PROTIME in the last 168 hours. Cardiac Enzymes: Recent Labs  Lab 08/19/19 1858  CKTOTAL 22*   BNP (last 3 results) No results for input(s): PROBNP in the last 8760 hours. HbA1C: No results for input(s): HGBA1C in the last 72 hours. CBG: Recent Labs  Lab 08/19/19 1905  GLUCAP 125*   Lipid Profile: No results for input(s): CHOL, HDL, LDLCALC, TRIG, CHOLHDL, LDLDIRECT in the last 72 hours. Thyroid Function Tests: No results for input(s): TSH, T4TOTAL, FREET4, T3FREE, THYROIDAB in the last 72 hours. Anemia Panel: No results for input(s): VITAMINB12, FOLATE, FERRITIN, TIBC, IRON, RETICCTPCT in the last 72 hours. Urine analysis:    Component Value Date/Time   COLORURINE AMBER (A) 08/19/2019 2105   APPEARANCEUR TURBID (A) 08/19/2019 2105   APPEARANCEUR Cloudy (A) 09/05/2017 1535   LABSPEC 1.011 08/19/2019 2105   LABSPEC 1.021 01/13/2014 0737   PHURINE 8.0 08/19/2019 2105   GLUCOSEU NEGATIVE 08/19/2019 2105   GLUCOSEU >=500 01/13/2014 0737   HGBUR LARGE (A) 08/19/2019 2105   BILIRUBINUR NEGATIVE 08/19/2019 2105   BILIRUBINUR negative 06/20/2019 1144   BILIRUBINUR Negative 09/05/2017 1535   BILIRUBINUR Negative 01/13/2014 [place] 08/19/2019 2105   PROTEINUR 100 (A) 08/19/2019 2105   UROBILINOGEN negative (A) 06/20/2019 1144   NITRITE NEGATIVE 08/19/2019 2105   LEUKOCYTESUR LARGE (A) 08/19/2019 2105   LEUKOCYTESUR 3+ 01/13/2014 0737    Radiological Exams on Admission: CT ABDOMEN PELVIS WO CONTRAST  Result Date: 08/19/2019 CLINICAL DATA:  69 year old female with abdominal pain. Concern for bowel obstruction. EXAM: CT CHEST, ABDOMEN AND PELVIS WITHOUT CONTRAST TECHNIQUE: Multidetector CT imaging of the chest, abdomen and pelvis was performed following the standard protocol without IV contrast. COMPARISON:  Chest radiograph dated 08/19/2019. FINDINGS:  Evaluation of this exam is limited in the absence of intravenous contrast. CT CHEST FINDINGS Cardiovascular: There is no cardiomegaly or pericardial effusion. Coronary vascular calcification of the LAD.  Mild atherosclerotic calcification of the thoracic aorta. The central pulmonary arteries are grossly unremarkable on this noncontrast CT. Mediastinum/Nodes: There is no hilar or mediastinal adenopathy. The esophagus and the thyroid gland are grossly unremarkable as visualized. No mediastinal fluid collection. Lungs/Pleura: Mild pleural thickening versus minimal right pleural effusion. Right lung base linear atelectasis/scarring. No focal consolidation, or pneumothorax. The central airways are patent. Musculoskeletal: Osteopenia with scoliosis and degenerative changes of the spine. Old healed left posterior rib fractures. No acute osseous pathology. CT ABDOMEN PELVIS FINDINGS No intra-abdominal free air or free fluid. Hepatobiliary: Several small hepatic hypodense lesions are not characterized on this noncontrast CT. Cholecystectomy. Pancreas: The pancreas is grossly unremarkable. Spleen: Normal in size without focal abnormality. Adrenals/Urinary Tract: The adrenal glands are unremarkable. Mild bilateral hydronephrosis, left greater right. Several nonobstructing left renal calculi measure up to 4 mm. There is left perinephric stranding concerning for pyelonephritis. Correlation with urinalysis recommended. The urinary bladder is distended. There is haziness of the bladder wall with perivesical stranding. Stomach/Bowel: There is no bowel obstruction or active inflammation. The appendix is normal. Vascular/Lymphatic: Moderate aortoiliac atherosclerotic disease. The IVC is grossly unremarkable. No portal venous gas. There is no adenopathy. Reproductive: Hysterectomy. Other: Mild diffuse subcutaneous edema. No fluid collection. Musculoskeletal: There is severe osteopenia, scoliosis, and degenerative changes of the spine  and hips. There are compression fractures of the superior endplate of L4 and inferior endplate of L3, likely acute. Clinical correlation is recommended. There is approximately 25% loss of vertebral body height at L4. There is disc desiccation at L2-L3 and L3-L4. Bilateral L5 pars defects with grade 1 L5-S1 anterolisthesis. IMPRESSION: 1. Compression fractures of the superior endplate of L4 and inferior endplate of L3, likely acute. Clinical correlation is recommended. 2. Distended urinary bladder with mild bilateral hydronephrosis and findings concerning for pyelonephritis. Correlation with urinalysis recommended. 3. Several nonobstructing left renal calculi.  No obstructing stone. 4. No bowel obstruction. Normal appendix. 5. Aortic Atherosclerosis (ICD10-I70.0). Electronically Signed   By: Anner Crete M.D.   On: 08/19/2019 20:15   CT Head Wo Contrast  Result Date: 08/19/2019 CLINICAL DATA:  Altered mental status EXAM: CT HEAD WITHOUT CONTRAST CT CERVICAL SPINE WITHOUT CONTRAST TECHNIQUE: Multidetector CT imaging of the head and cervical spine was performed following the standard protocol without intravenous contrast. Multiplanar CT image reconstructions of the cervical spine were also generated. COMPARISON:  CT brain and cervical spine 08/05/2019 FINDINGS: CT HEAD FINDINGS Brain: No acute territorial infarction, hemorrhage or intracranial mass. Atrophy. Mild chronic small vessel ischemic change of the white matter. Stable ventricle size. Vascular: No hyperdense vessels. Scattered carotid vascular calcification. Skull: Normal. Negative for fracture or focal lesion. Sinuses/Orbits: No acute finding. Other: None CT CERVICAL SPINE FINDINGS Alignment: No subluxation.  Facet alignment is maintained. Skull base and vertebrae: No acute fracture. No primary bone lesion or focal pathologic process. Soft tissues and spinal canal: No prevertebral fluid or swelling. No visible canal hematoma. Disc levels: Mild  degenerative changes at C5-C6, C6-C7 and C7-T1. Facet degenerative changes at multiple levels. Upper chest: Negative. Other: None IMPRESSION: 1. No CT evidence for acute intracranial abnormality. Atrophy and mild chronic small vessel ischemic change of the white matter. 2. No acute osseous abnormality of the cervical spine Electronically Signed   By: Donavan Foil M.D.   On: 08/19/2019 20:28   CT Chest Wo Contrast  Result Date: 08/19/2019 CLINICAL DATA:  69 year old female with abdominal pain. Concern for bowel obstruction. EXAM: CT CHEST, ABDOMEN AND PELVIS WITHOUT CONTRAST  TECHNIQUE: Multidetector CT imaging of the chest, abdomen and pelvis was performed following the standard protocol without IV contrast. COMPARISON:  Chest radiograph dated 08/19/2019. FINDINGS: Evaluation of this exam is limited in the absence of intravenous contrast. CT CHEST FINDINGS Cardiovascular: There is no cardiomegaly or pericardial effusion. Coronary vascular calcification of the LAD. Mild atherosclerotic calcification of the thoracic aorta. The central pulmonary arteries are grossly unremarkable on this noncontrast CT. Mediastinum/Nodes: There is no hilar or mediastinal adenopathy. The esophagus and the thyroid gland are grossly unremarkable as visualized. No mediastinal fluid collection. Lungs/Pleura: Mild pleural thickening versus minimal right pleural effusion. Right lung base linear atelectasis/scarring. No focal consolidation, or pneumothorax. The central airways are patent. Musculoskeletal: Osteopenia with scoliosis and degenerative changes of the spine. Old healed left posterior rib fractures. No acute osseous pathology. CT ABDOMEN PELVIS FINDINGS No intra-abdominal free air or free fluid. Hepatobiliary: Several small hepatic hypodense lesions are not characterized on this noncontrast CT. Cholecystectomy. Pancreas: The pancreas is grossly unremarkable. Spleen: Normal in size without focal abnormality. Adrenals/Urinary Tract:  The adrenal glands are unremarkable. Mild bilateral hydronephrosis, left greater right. Several nonobstructing left renal calculi measure up to 4 mm. There is left perinephric stranding concerning for pyelonephritis. Correlation with urinalysis recommended. The urinary bladder is distended. There is haziness of the bladder wall with perivesical stranding. Stomach/Bowel: There is no bowel obstruction or active inflammation. The appendix is normal. Vascular/Lymphatic: Moderate aortoiliac atherosclerotic disease. The IVC is grossly unremarkable. No portal venous gas. There is no adenopathy. Reproductive: Hysterectomy. Other: Mild diffuse subcutaneous edema. No fluid collection. Musculoskeletal: There is severe osteopenia, scoliosis, and degenerative changes of the spine and hips. There are compression fractures of the superior endplate of L4 and inferior endplate of L3, likely acute. Clinical correlation is recommended. There is approximately 25% loss of vertebral body height at L4. There is disc desiccation at L2-L3 and L3-L4. Bilateral L5 pars defects with grade 1 L5-S1 anterolisthesis. IMPRESSION: 1. Compression fractures of the superior endplate of L4 and inferior endplate of L3, likely acute. Clinical correlation is recommended. 2. Distended urinary bladder with mild bilateral hydronephrosis and findings concerning for pyelonephritis. Correlation with urinalysis recommended. 3. Several nonobstructing left renal calculi.  No obstructing stone. 4. No bowel obstruction. Normal appendix. 5. Aortic Atherosclerosis (ICD10-I70.0). Electronically Signed   By: Anner Crete M.D.   On: 08/19/2019 20:15   CT Cervical Spine Wo Contrast  Result Date: 08/19/2019 CLINICAL DATA:  Altered mental status EXAM: CT HEAD WITHOUT CONTRAST CT CERVICAL SPINE WITHOUT CONTRAST TECHNIQUE: Multidetector CT imaging of the head and cervical spine was performed following the standard protocol without intravenous contrast. Multiplanar CT  image reconstructions of the cervical spine were also generated. COMPARISON:  CT brain and cervical spine 08/05/2019 FINDINGS: CT HEAD FINDINGS Brain: No acute territorial infarction, hemorrhage or intracranial mass. Atrophy. Mild chronic small vessel ischemic change of the white matter. Stable ventricle size. Vascular: No hyperdense vessels. Scattered carotid vascular calcification. Skull: Normal. Negative for fracture or focal lesion. Sinuses/Orbits: No acute finding. Other: None CT CERVICAL SPINE FINDINGS Alignment: No subluxation.  Facet alignment is maintained. Skull base and vertebrae: No acute fracture. No primary bone lesion or focal pathologic process. Soft tissues and spinal canal: No prevertebral fluid or swelling. No visible canal hematoma. Disc levels: Mild degenerative changes at C5-C6, C6-C7 and C7-T1. Facet degenerative changes at multiple levels. Upper chest: Negative. Other: None IMPRESSION: 1. No CT evidence for acute intracranial abnormality. Atrophy and mild chronic small vessel ischemic change of  the white matter. 2. No acute osseous abnormality of the cervical spine Electronically Signed   By: Donavan Foil M.D.   On: 08/19/2019 20:28   DG Chest Port 1 View  Result Date: 08/19/2019 CLINICAL DATA:  Change in mental status EXAM: PORTABLE CHEST 1 VIEW COMPARISON:  Jun 10, 2017 FINDINGS: The heart size and mediastinal contours are within normal limits. Aortic knob calcifications are seen. There appears to be a 2 cm rounded nodule seen within the right perihilar region. No large airspace consolidation seen within the left lung. No pleural effusion. No acute osseous abnormality. IMPRESSION: 2 cm rounded nodule within the right perihilar region not clearly identified on the prior exam. Would recommend dedicated nonemergent chest CT for further evaluation. Electronically Signed   By: Prudencio Pair M.D.   On: 08/19/2019 19:21    EKG: Independently reviewed. Interpretation : Normal sinus rhythm  with no acute ST-T wave changes  Assessment/Plan 69 year old female with history of DM, GERD, anxiety, OSA, Parkinson's with dementia, hypothyroidism, frequent falls, admitted for severe sepsis secondary to acute pyelonephritis after presenting with shortness of breath and altered mental status.      Severe sepsis ([place]) Acute pyelonephritis -Patient with leukocytosis of 18,000, lactic acid 2.5, tachycardia, hypotension, AKI, altered mental status and tachypnea, pyuria -Sepsis fluid bolus and IV fluids to keep MAP over 65 -IV Rocephin -Follow cultures    Diabetes mellitus type 2, uncomplicated (HCC) -Sliding scale insulin pending med rec and follow A1c    Bilateral hydronephrosis secondary to acute urinary retention -CT abdomen showed distended bladder and bilateral hydronephrosis -Continue Foley catheter placed in ER -Consider urology consult in the a.m. -Consider oral Flomax when more alert     Acute metabolic encephalopathy -Secondary to sepsis above -Fall and aspiration precautions and neurologic checks    AKI (acute kidney injury) ([place]) -Likely post renal related to hydronephrosis and due to renal hypoperfusion from hypotension related to sepsis -Foley catheter as above to decompress bladder -Treat sepsis, maintain MAP over 65    Dementia due to Parkinson's disease without behavioral disturbance ([place]) -No acute behavioral disturbance.  Continue home meds pending med rec and once safe to swallow    Essential hypertension -Hold home antihypertensives given hypotension    Acquired hypothyroidism -Resume when able to swallow    DVT prophylaxis: Lovenox  Code Status: full code  Family Communication:  none  Disposition Plan: Back to previous home environment Consults called: none  Status:At the time of admission, it appears that the appropriate admission status for this patient is INPATIENT. This is judged to be reasonable and necessary in order to provide the required  intensity of service to ensure the patient's safety given the presenting symptoms, physical exam findings, and initial radiographic and laboratory data in the context of their  Comorbid conditions.   Patient requires inpatient status due to high intensity of service, high risk for further deterioration and high frequency of surveillance required.   I certify that at the point of admission it is my clinical judgment that the patient will require inpatient hospital care spanning beyond [place] MD [facility]     08/19/2019, 10:05 PM

## 2019-08-19 NOTE — ED Provider Notes (Signed)
[facility] Emergency Department Provider Note    First MD Initiated Contact with Patient 08/19/19 1851     (approximate)  I have reviewed the triage vital signs and the nursing notes.   HISTORY  Chief Complaint Hypotension  Level V Caveat:  AMS - dementia  HPI Theresa Mills is a 69 y.o. female the below listed past medical history presents to the ER from [place] with initial call out being shortness of breath the patient found to be altered uncertain last seen normal.  EMS was unable to obtain any additional information from staff at the facility.  Patient complaining of abdominal pain neck pain and chest pain.    Past Medical History:  Diagnosis Date   Anxiety    Diabetes ([place])    Difficult intravenous access    GERD (gastroesophageal reflux disease)    HBP (high blood pressure)    Hyperlipidemia    Osteoarthritis    Parkinson disease ([place])    Sleep apnea    Thyroid disease    Family History  Problem Relation Age of Onset   Asthma Father    Cancer Sister    Stroke Maternal Uncle    Heart disease Maternal Uncle    Diabetes Maternal Uncle    Past Surgical History:  Procedure Laterality Date   ABDOMINAL HYSTERECTOMY     CATARACT EXTRACTION Bilateral    CHOLECYSTECTOMY     COLONOSCOPY WITH PROPOFOL N/A 04/02/2015   Procedure: COLONOSCOPY WITH PROPOFOL;  Surgeon: Josefine Class, MD;  Location: [facility] ENDOSCOPY;  Service: Endoscopy;  Laterality: N/A;   ESOPHAGOGASTRODUODENOSCOPY (EGD) WITH PROPOFOL N/A 04/02/2015   Procedure: ESOPHAGOGASTRODUODENOSCOPY (EGD) WITH PROPOFOL;  Surgeon: Josefine Class, MD;  Location: [facility] ENDOSCOPY;  Service: Endoscopy;  Laterality: N/A;   GALLBLADDER SURGERY     LAPAROSCOPIC HYSTERECTOMY     THYROIDECTOMY Left    TONSILLECTOMY     TONSILLECTOMY Bilateral    Patient Active Problem List   Diagnosis Date Noted   Severe sepsis ([place]) 08/19/2019   Bilateral hydronephrosis  08/19/2019   Acute pyelonephritis 89/38/1017   Acute metabolic encephalopathy 51/02/5850   Acute urinary retention 08/19/2019   AKI (acute kidney injury) ([place]) 08/19/2019   Osteoarthritis of multiple joints    Frequent falls 08/05/2019   Urinary tract infection with hematuria 06/11/2019   Nausea and vomiting 02/13/2019   Acquired hypothyroidism 02/13/2019   Acquired bilateral hammer toes 01/23/2019   Type 2 diabetes mellitus with hyperglycemia ([place]) 09/15/2018   Encounter for general adult medical examination with abnormal findings 09/05/2017   Dysuria 09/05/2017   Uncontrolled type 2 diabetes mellitus with hyperglycemia ([place]) 06/18/2017   Rib fractures 06/11/2017   Pressure injury of skin 06/11/2017   Rhabdomyolysis 06/10/2017   Elevated troponin 06/10/2017   Hypernatremia 06/10/2017   Fall 06/10/2017   Arthritis 01/08/2017   Cataracts, bilateral 01/08/2017   Chickenpox 01/08/2017   Shingles 01/08/2017   Depression 01/08/2017   Diabetes mellitus type 2, uncomplicated ([place]) 77/82/4235   Hyperlipidemia, unspecified 01/08/2017   Essential hypertension 01/08/2017   Migraine headache 01/08/2017   Thyroid disease 01/08/2017   Parkinson disease ([place]) 01/08/2017   Anxiety, generalized 09/01/2016   Dementia due to Parkinson's disease without behavioral disturbance ([place]) 09/01/2016   History of depression 09/01/2016   Morbid obesity with BMI of 40.0-44.9, adult ([place]) 09/01/2016   RLS (restless legs syndrome) 09/01/2016      Prior to Admission medications   Medication Sig Start Date End Date Taking? Authorizing  Provider  ALPRAZolam (XANAX) 0.25 MG tablet Take 1 tablet (0.25 mg total) by mouth every morning. 08/09/19   Fritzi Mandes, MD  ALPRAZolam Duanne Moron) 0.5 MG tablet Take 1 tablet (0.5 mg total) by mouth at bedtime as needed for anxiety. 08/09/19   Fritzi Mandes, MD  Calcium Carbonate-Vitamin D (CALCIUM HIGH POTENCY/VITAMIN D) 600-200 MG-UNIT TABS  Take 1 tablet by mouth daily.     [provider]  carbidopa-levodopa (SINEMET CR) 50-200 MG tablet Take 1 tablet by mouth 2 (two) times daily.  07/31/18   [provider]  carbidopa-levodopa (SINEMET IR) 25-100 MG tablet Take 2 tablets by mouth 4 (four) times daily. 07/25/19   [provider]  celecoxib (CELEBREX) 200 MG capsule Take 1 capsule (200 mg total) by mouth 2 (two) times daily as needed. 07/29/19   Ronnell Freshwater, NP  citalopram (CELEXA) 40 MG tablet Take 40 mg by mouth daily.    [provider]  docusate sodium (COLACE) 100 MG capsule Take 1 capsule (100 mg total) by mouth 2 (two) times daily. 08/09/19   Fritzi Mandes, MD  glucose blood (ONETOUCH VERIO) test strip TEST BLOOD SUGAR BID 01/27/16   [provider]  levothyroxine (SYNTHROID) 175 MCG tablet Take 1 tablet (175 mcg total) by mouth daily. 02/10/19   Ronnell Freshwater, NP  Melatonin 3 MG TABS Take by mouth.    [provider]  metFORMIN (GLUCOPHAGE) 500 MG tablet Take 500 mg by mouth 2 (two) times daily with a meal.     [provider]  mirtazapine (REMERON) 7.5 MG tablet Take 1 tablet (7.5 mg total) by mouth at bedtime. 02/10/19   Ronnell Freshwater, NP  Multiple Vitamins-Minerals (MULTIVITAMIN WOMEN 50+ PO) Take 1 tablet by mouth daily.     [provider]  omeprazole (PRILOSEC) 40 MG capsule Take 1 capsule (40 mg total) by mouth daily. 06/12/19   Ronnell Freshwater, NP  ondansetron (ZOFRAN-ODT) 4 MG disintegrating tablet Take 1 tablet (4 mg total) by mouth every 8 (eight) hours as needed for nausea or vomiting. 06/05/19   Ronnell Freshwater, NP  ONETOUCH DELICA LANCETS 57D MISC TEST BLOOD SUGAR BID 01/27/16   [provider]  rOPINIRole (REQUIP XL) 2 MG 24 hr tablet Take 2 mg by mouth at bedtime.     [provider]  sertraline (ZOLOFT) 50 MG tablet Take 1 tablet (50 mg total) by mouth daily. 02/10/19   Ronnell Freshwater, NP  simvastatin (ZOCOR) 40 MG  tablet Take 1 tablet (40 mg total) by mouth at bedtime. 04/10/19   Ronnell Freshwater, NP    Allergies Codeine, Ephadrene [cholestatin], Other, and Valium [diazepam]    Social History Social History   Tobacco Use   Smoking status: Never Smoker   Smokeless tobacco: Never Used  Substance Use Topics   Alcohol use: No   Drug use: No    Review of Systems Patient denies headaches, rhinorrhea, blurry vision, numbness, shortness of breath, chest pain, edema, cough, abdominal pain, nausea, vomiting, diarrhea, dysuria, fevers, rashes or hallucinations unless otherwise stated above in HPI. ____________________________________________   PHYSICAL EXAM:  VITAL SIGNS: Vitals:   08/19/19 1945 08/19/19 2312  BP: (!) 101/54 (!) 120/57  Pulse:  85  Resp: 16 18  Temp:    SpO2: 100% 98%    Constitutional: ill appearing and disoriented   Eyes: Conjunctivae are normal.  Head: Atraumatic. Nose: No congestion/rhinnorhea. Mouth/Throat: Mucous membranes are moist.   Neck: No stridor.  Painless ROM.  Cardiovascular: Normal rate, regular rhythm. Grossly normal heart sounds.  Good peripheral circulation. Respiratory: Normal respiratory effort.  No retractions. Lungs CTAB. Gastrointestinal: Soft and nontender. No distention. No abdominal bruits. No CVA tenderness. Genitourinary: deferred Musculoskeletal: No lower extremity tenderness nor edema.  No joint effusions. Neurologic:  MAE, encephalopathic, no focal deficits Skin:  Skin is warm, dry and intact. No rash noted. Psychiatric: encephalopathic  ____________________________________________   LABS (all labs ordered are listed, but only abnormal results are displayed)  Results for orders placed or performed during the hospital encounter of 08/19/19 (from the past 24 hour(s))  Lactic acid, plasma     Status: Abnormal   Collection Time: 08/19/19  6:58 PM  Result Value Ref Range   Lactic Acid, Venous 2.5 (HH) 0.5 - 1.9 mmol/L    Comprehensive metabolic panel     Status: Abnormal   Collection Time: 08/19/19  6:58 PM  Result Value Ref Range   Sodium 137 135 - 145 mmol/L   Potassium 4.8 3.5 - 5.1 mmol/L   Chloride 104 98 - 111 mmol/L   CO2 19 (L) 22 - 32 mmol/L   Glucose, Bld 125 (H) 70 - 99 mg/dL   BUN 74 (H) 8 - 23 mg/dL   Creatinine, Ser 2.43 (H) 0.44 - 1.00 mg/dL   Calcium 8.3 (L) 8.9 - 10.3 mg/dL   Total Protein 7.8 6.5 - 8.1 g/dL   Albumin 2.6 (L) 3.5 - 5.0 g/dL   AST 271 (H) 15 - 41 U/L   ALT 24 0 - 44 U/L   Alkaline Phosphatase 137 (H) 38 - 126 U/L   Total Bilirubin 1.2 0.3 - 1.2 mg/dL   GFR calc non Af Amer 19 (L) >60 mL/min   GFR calc Af Amer 23 (L) >60 mL/min   Anion gap 14 5 - 15  CBC WITH DIFFERENTIAL     Status: Abnormal   Collection Time: 08/19/19  6:58 PM  Result Value Ref Range   WBC 17.9 (H) 4.0 - 10.5 K/uL   RBC 4.47 3.87 - 5.11 MIL/uL   Hemoglobin 13.9 12.0 - 15.0 g/dL   HCT 42.1 36 - 46 %   MCV 94.2 80.0 - 100.0 fL   MCH 31.1 26.0 - 34.0 pg   MCHC 33.0 30.0 - 36.0 g/dL   RDW 13.3 11.5 - 15.5 %   Platelets 415 (H) 150 - 400 K/uL   nRBC 0.0 0.0 - 0.2 %   Neutrophils Relative % 92 %   Neutro Abs 16.5 (H) 1.7 - 7.7 K/uL   Lymphocytes Relative 4 %   Lymphs Abs 0.8 0.7 - 4.0 K/uL   Monocytes Relative 2 %   Monocytes Absolute 0.3 0 - 1 K/uL   Eosinophils Relative 0 %   Eosinophils Absolute 0.0 0 - 0 K/uL   Basophils Relative 1 %   Basophils Absolute 0.1 0 - 0 K/uL   Immature Granulocytes 1 %   Abs Immature Granulocytes 0.18 (H) 0.00 - 0.07 K/uL  Procalcitonin     Status: None   Collection Time: 08/19/19  6:58 PM  Result Value Ref Range   Procalcitonin 4.62 ng/mL  CK     Status: Abnormal   Collection Time: 08/19/19  6:58 PM  Result Value Ref Range   Total CK 22 (L) 38.0 - 234.0 U/L  Troponin I (High Sensitivity)     Status: Abnormal   Collection Time: 08/19/19  6:58 PM  Result Value Ref Range   Troponin  I (High Sensitivity) 28 (H) <18 ng/L  Glucose, capillary     Status:  Abnormal   Collection Time: 08/19/19  7:05 PM  Result Value Ref Range   Glucose-Capillary 125 (H) 70 - 99 mg/dL  Urinalysis, Complete w Microscopic     Status: Abnormal   Collection Time: 08/19/19  9:05 PM  Result Value Ref Range   Color, Urine AMBER (A) YELLOW   APPearance TURBID (A) CLEAR   Specific Gravity, Urine 1.011 1.005 - 1.030   pH 8.0 5.0 - 8.0   Glucose, UA NEGATIVE NEGATIVE mg/dL   Hgb urine dipstick LARGE (A) NEGATIVE   Bilirubin Urine NEGATIVE NEGATIVE   Ketones, ur NEGATIVE NEGATIVE mg/dL   Protein, ur 100 (A) NEGATIVE mg/dL   Nitrite NEGATIVE NEGATIVE   Leukocytes,Ua LARGE (A) NEGATIVE   RBC / HPF 21-50 0 - 5 RBC/hpf   WBC, UA >50 (H) 0 - 5 WBC/hpf   Bacteria, UA RARE (A) NONE SEEN   Squamous Epithelial / LPF NONE SEEN 0 - 5   WBC Clumps PRESENT    ____________________________________________  EKG My review and personal interpretation at Time: 18:34   Indication: weakness  Rate: 85  Rhythm: sinus Axis: normal Other: nonspecific st abn, no stem, poor r wave progression ____________________________________________  RADIOLOGY  I personally reviewed all radiographic images ordered to evaluate for the above acute complaints and reviewed radiology reports and findings.  These findings were personally discussed with the patient.  Please see medical record for radiology report.  ____________________________________________   PROCEDURES  Procedure(s) performed:  .Critical Care Performed by: Merlyn Lot, MD Authorized by: Merlyn Lot, MD   Critical care provider statement:    Critical care time (minutes):  35   Critical care time was exclusive of:  Separately billable procedures and treating other patients   Critical care was necessary to treat or prevent imminent or life-threatening deterioration of the following conditions:  Renal failure and sepsis   Critical care was time spent personally by me on the following activities:  Development of  treatment plan with patient or surrogate, discussions with consultants, evaluation of patient's response to treatment, examination of patient, obtaining history from patient or surrogate, ordering and performing treatments and interventions, ordering and review of laboratory studies, ordering and review of radiographic studies, pulse oximetry, re-evaluation of patient's condition and review of old charts      Critical Care performed: yes ____________________________________________   INITIAL IMPRESSION / [place] / ED COURSE  Pertinent labs & imaging results that were available during my care of the patient were reviewed by me and considered in my medical decision making (see chart for details).   DDX: Dehydration, sepsis, pna, uti, hypoglycemia, cva, drug effect, withdrawal, encephalitis   DELISIA MCQUISTON is a 69 y.o. who presents to the ED with presentation as described above.  Patient unable to provide much additional history.  Does appear critically ill.  Will provide IV fluids as well as or blood work as well as CT imaging for the but differential. The patient will be placed on continuous pulse oximetry and telemetry for monitoring.  Laboratory evaluation will be sent to evaluate for the above complaints.   Clinical Course as of Aug 18 2328  Tue Aug 19, 2019  2026 Patient does have evidence of acute urinary retention and findings suggestive of pyelonephritis.  Will order IV antibiotics.  Given her lactic acidosis we will continue with IV hydration.  Anticipate patient will require hospitalization.   [  PR]  2145 Urinalysis is grossly purulent.  We will continue with IV hydration.  She is receiving IV antibiotics.  Will be admitted for further medical management.   [PR]    Clinical Course User Index [PR] Merlyn Lot, MD    The patient was evaluated in Emergency Department today for the symptoms described in the history of present illness. He/she was evaluated in the  context of the global COVID-19 pandemic, which necessitated consideration that the patient might be at risk for infection with the SARS-CoV-2 virus that causes COVID-19. Institutional protocols and algorithms that pertain to the evaluation of patients at risk for COVID-19 are in a state of rapid change based on information released by regulatory bodies including the CDC and federal and state organizations. These policies and algorithms were followed during the patient's care in the ED.  As part of my medical decision making, I reviewed the following data within the [place] notes reviewed and incorporated, Labs reviewed, notes from prior ED visits and [place] Controlled Substance Database   ____________________________________________   FINAL CLINICAL IMPRESSION(S) / ED DIAGNOSES  Final diagnoses:  AKI (acute kidney injury) ([place])  Pyelonephritis      NEW MEDICATIONS STARTED DURING THIS VISIT:  New Prescriptions   No medications on file     Note:  This document was prepared using Dragon voice recognition software and may include unintentional dictation errors.    Merlyn Lot, MD 08/19/19 2330

## 2019-08-19 NOTE — Progress Notes (Signed)
CODE SEPSIS - PHARMACY COMMUNICATION  **Broad Spectrum Antibiotics should be administered within 1 hour of Sepsis diagnosis**  Time Code Sepsis Called/Page Received: 2201  Antibiotics Ordered: ceftriaxone  Time of 1st antibiotic administration: 2037  Additional action taken by pharmacy:   If necessary, Name of Provider/Nurse Contacted:     Tobie Lords ,PharmD Clinical Pharmacist  08/19/2019  10:49 PM

## 2019-08-19 NOTE — ED Notes (Signed)
Patient transported to CT 

## 2019-08-20 ENCOUNTER — Telehealth: Payer: Self-pay

## 2019-08-20 DIAGNOSIS — R338 Other retention of urine: Secondary | ICD-10-CM

## 2019-08-20 DIAGNOSIS — G9341 Metabolic encephalopathy: Secondary | ICD-10-CM

## 2019-08-20 DIAGNOSIS — N10 Acute tubulo-interstitial nephritis: Secondary | ICD-10-CM

## 2019-08-20 DIAGNOSIS — E440 Moderate protein-calorie malnutrition: Secondary | ICD-10-CM | POA: Diagnosis present

## 2019-08-20 LAB — BLOOD CULTURE ID PANEL (REFLEXED)

## 2019-08-20 LAB — CBC
HCT: 35.8 % — ABNORMAL LOW (ref 36.0–46.0)
Hemoglobin: 12 g/dL (ref 12.0–15.0)
MCH: 30.9 pg (ref 26.0–34.0)
MCHC: 33.5 g/dL (ref 30.0–36.0)
MCV: 92.3 fL (ref 80.0–100.0)
Platelets: 340 10*3/uL (ref 150–400)
RBC: 3.88 MIL/uL (ref 3.87–5.11)
RDW: 13.3 % (ref 11.5–15.5)
WBC: 17 10*3/uL — ABNORMAL HIGH (ref 4.0–10.5)
nRBC: 0 % (ref 0.0–0.2)

## 2019-08-20 LAB — BASIC METABOLIC PANEL
Anion gap: 9 (ref 5–15)
BUN: 67 mg/dL — ABNORMAL HIGH (ref 8–23)
CO2: 18 mmol/L — ABNORMAL LOW (ref 22–32)
Calcium: 7.8 mg/dL — ABNORMAL LOW (ref 8.9–10.3)
Chloride: 110 mmol/L (ref 98–111)
Creatinine, Ser: 1.85 mg/dL — ABNORMAL HIGH (ref 0.44–1.00)
GFR calc Af Amer: 31 mL/min — ABNORMAL LOW (ref >60)
GFR calc non Af Amer: 27 mL/min — ABNORMAL LOW (ref >60)
Glucose, Bld: 176 mg/dL — ABNORMAL HIGH (ref 70–99)
Potassium: 4.4 mmol/L (ref 3.5–5.1)
Sodium: 137 mmol/L (ref 135–145)

## 2019-08-20 LAB — CORTISOL-AM, BLOOD: Cortisol - AM: 31.5 ug/dL — ABNORMAL HIGH (ref 6.7–22.6)

## 2019-08-20 LAB — MRSA PCR SCREENING: MRSA by PCR: NEGATIVE

## 2019-08-20 LAB — PROTIME-INR
INR: 1.3 — ABNORMAL HIGH (ref 0.8–1.2)
Prothrombin Time: 16.1 seconds — ABNORMAL HIGH (ref 11.4–15.2)

## 2019-08-20 LAB — LACTIC ACID, PLASMA: Lactic Acid, Venous: 1.9 mmol/L (ref 0.5–1.9)

## 2019-08-20 LAB — TROPONIN I (HIGH SENSITIVITY): Troponin I (High Sensitivity): 16 ng/L (ref <18)

## 2019-08-20 LAB — GLUCOSE, CAPILLARY
Glucose-Capillary: 140 mg/dL — ABNORMAL HIGH (ref 70–99)
Glucose-Capillary: 121 mg/dL — ABNORMAL HIGH (ref 70–99)
Glucose-Capillary: 123 mg/dL — ABNORMAL HIGH (ref 70–99)
Glucose-Capillary: 120 mg/dL — ABNORMAL HIGH (ref 70–99)

## 2019-08-20 LAB — PROCALCITONIN: Procalcitonin: 3.3 ng/mL

## 2019-08-20 LAB — SARS CORONAVIRUS 2 BY RT PCR (HOSPITAL ORDER, PERFORMED IN ~~LOC~~ HOSPITAL LAB): SARS Coronavirus 2: NEGATIVE

## 2019-08-20 MED ORDER — SODIUM CHLORIDE 0.9 % IV SOLN
2.0000 g | INTRAVENOUS | Status: DC
  Administered 2019-08-20 – 2019-08-22 (×3): 2 g via INTRAVENOUS
  Filled 2019-08-20: qty 20
  Filled 2019-08-20 (×2): qty 2

## 2019-08-20 MED ORDER — ENSURE ENLIVE PO LIQD
237.0000 mL | Freq: Three times a day (TID) | ORAL | Status: DC
  Administered 2019-08-20 – 2019-09-02 (×31): 237 mL via ORAL

## 2019-08-20 MED ORDER — ADULT MULTIVITAMIN W/MINERALS CH
1.0000 | ORAL_TABLET | Freq: Every day | ORAL | Status: DC
  Administered 2019-08-21 – 2019-09-02 (×10): 1 via ORAL
  Filled 2019-08-20 (×15): qty 1

## 2019-08-20 MED ORDER — CHLORHEXIDINE GLUCONATE CLOTH 2 % EX PADS
6.0000 | MEDICATED_PAD | Freq: Every day | CUTANEOUS | Status: DC
  Administered 2019-08-20 – 2019-09-03 (×10): 6 via TOPICAL

## 2019-08-20 NOTE — ED Notes (Signed)
Attempted to call report at this time. Unit secretary stated that they were talking to the [facility] about this pt and placed this RN on hold for approximately 10 minutes. After returning to the phone, the secretary started the assigned inpatient RN was in another room and would call when ready for report.

## 2019-08-20 NOTE — Progress Notes (Signed)
PHARMACY - PHYSICIAN COMMUNICATION CRITICAL VALUE ALERT - BLOOD CULTURE IDENTIFICATION (BCID)  Theresa Mills is an 70 y.o. female who [facility] with a chief complaint of shortness of breath and altered mental status  Assessment:  2/4 bottles (different sets) gram negative rods. BCID detected Proteus spp without carbapenem resistance (urinary source/pyelonephritis).  Name of physician (or Provider) Contacted: Dr. Arbutus Ped  Current antibiotics: Ceftriaxone 1 g q24h  Changes to prescribed antibiotics recommended:  Increase ceftriaxone to 2 g q24h. Recommendation accepted. Order modified.  Results for orders placed or performed during the hospital encounter of 08/19/19  Blood Culture ID Panel (Reflexed) (Collected: 08/19/2019  7:03 PM)  Result Value Ref Range   Enterococcus species NOT DETECTED NOT DETECTED   Listeria monocytogenes NOT DETECTED NOT DETECTED   Staphylococcus species NOT DETECTED NOT DETECTED   Staphylococcus aureus (BCID) NOT DETECTED NOT DETECTED   Streptococcus species NOT DETECTED NOT DETECTED   Streptococcus agalactiae NOT DETECTED NOT DETECTED   Streptococcus pneumoniae NOT DETECTED NOT DETECTED   Streptococcus pyogenes NOT DETECTED NOT DETECTED   Acinetobacter baumannii NOT DETECTED NOT DETECTED   Enterobacteriaceae species DETECTED (A) NOT DETECTED   Enterobacter cloacae complex NOT DETECTED NOT DETECTED   Escherichia coli NOT DETECTED NOT DETECTED   Klebsiella oxytoca NOT DETECTED NOT DETECTED   Klebsiella pneumoniae NOT DETECTED NOT DETECTED   Proteus species DETECTED (A) NOT DETECTED   Serratia marcescens NOT DETECTED NOT DETECTED   Carbapenem resistance NOT DETECTED NOT DETECTED   Haemophilus influenzae NOT DETECTED NOT DETECTED   Neisseria meningitidis NOT DETECTED NOT DETECTED   Pseudomonas aeruginosa NOT DETECTED NOT DETECTED   Candida albicans NOT DETECTED NOT DETECTED   Candida glabrata NOT DETECTED NOT DETECTED   Candida  krusei NOT DETECTED NOT DETECTED   Candida parapsilosis NOT DETECTED NOT DETECTED   Candida tropicalis NOT DETECTED NOT DETECTED    Benita Gutter 08/20/2019  8:47 AM

## 2019-08-20 NOTE — Hospital Course (Signed)
Theresa Mills is a 69 y.o. female with medical history of DM, GERD, anxiety, OSA, Parkinson's with dementia, hypothyroidism, frequent falls, brought in 08/19/19 by EMS after SNF staff called due to shortness of breath and altered mental status.  Patient complained of abdominal pain and neck pain but could not provide additional history due to dementia and altered mental status.  In the ED, patient was lethargic, afebrile, hypotensive with BP 83/41, HR 90, RR 24, O2 sat 93% on room air.  Labs were notable for leukocytosis 18k, lactic acidosis 2.5.  UA showed pyuria.  Creatinine was 2.43 (baseline 0.69), procalcitonin 4.62, troponin 28.  CT abdomen pelvis showed distended urinary bladder with bilateral hydronephrosis and findings concerning for pyelonephritis, several nonobstructing left renal calculi.  No focal consolidation was seen on CT chest.  EKG was normal sinus rhythm.  Patient's blood pressure improved with IV fluid bolus.  Started on IV antibiotics and admitted to hospitalist service for further evaluation and management.

## 2019-08-20 NOTE — Progress Notes (Signed)
PROGRESS NOTE    Theresa Mills   ZOX:[id number]  DOB: 02/28/1950  PCP: Ronnell Freshwater, NP    DOA: 08/19/2019 LOS: 1   Brief Narrative   Theresa Mills is a 69 y.o. female with medical history of DM, GERD, anxiety, OSA, Parkinson's with dementia, hypothyroidism, frequent falls, brought in 08/19/19 by EMS after SNF staff called due to shortness of breath and altered mental status.  Patient complained of abdominal pain and neck pain but could not provide additional history due to dementia and altered mental status.  In the ED, patient was lethargic, afebrile, hypotensive with BP 83/41, HR 90, RR 24, O2 sat 93% on room air.  Labs were notable for leukocytosis 18k, lactic acidosis 2.5.  UA showed pyuria.  Creatinine was 2.43 (baseline 0.69), procalcitonin 4.62, troponin 28.  CT abdomen pelvis showed distended urinary bladder with bilateral hydronephrosis and findings concerning for pyelonephritis, several nonobstructing left renal calculi.  No focal consolidation was seen on CT chest.  EKG was normal sinus rhythm.  Patient's blood pressure improved with IV fluid bolus.  Started on IV antibiotics and admitted to hospitalist service for further evaluation and management.     Assessment & Plan   Principal Problem:   Severe sepsis ([place]) Active Problems:   Dementia due to Parkinson's disease without behavioral disturbance ([place])   Diabetes mellitus type 2, uncomplicated (HCC)   Essential hypertension   Pressure injury of coccygeal region, stage 2 (HCC)   Acquired hypothyroidism   Bilateral hydronephrosis   Acute pyelonephritis   Acute metabolic encephalopathy   Acute urinary retention   AKI (acute kidney injury) ([place])   Malnutrition of moderate degree   Severe sepsis secondary to acute pyelonephritis and bacteremia - present on admission with leukocytosis, tachycardia, hypotension that was fluid responsive, lactic acidosis and AKI.  Imaging findings and UA are consistent with  pyelonephritis.   Sepsis physiology has improved and lactic acidosis resolved.  Blood cultures growing Proteus and Enterobacteriaceae spp.  Increase Rocephin to 2 g IV daily.  Follow-up culture sensitivities.  Continue IV fluids, reduce rate to 75 cc/h.  Monitor respiratory and volume status closely.  Maintain MAP over 65 with additional IV fluids as needed.  Bilateral hydronephrosis secondary to acute urinary retention -present on admission, CT abdomen showed distended bladder and bilateral hydronephrosis.  Foley catheter was placed in the ER.  Will do voiding trial in 24 to 48 hours and consider urology consult.  Consider Flomax.  Acute kidney injury -present on admission with creatinine 2.43, prior baseline 0.69.  Likely multifactorial with sepsis and hypotension causing prerenal azotemia and post renal secondary to hydronephrosis and acute urinary retention.  Renal function improving.  Daily BMP to monitor.  Avoid nephrotoxins and hypotension.  Renally dose meds as indicated.  Acute metabolic encephalopathy -present on admission likely secondary to infection on top of baseline dementia.  Expect improvement with antibiotics as above.  Monitor closely.  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  Continue home medications once safe to take p.o.  Essential hypertension -antihypertensives are held due to hypotension in the setting of sepsis as above  Acquired hypothyroidism -resume levothyroxine once taking p.o. Patient BMI: Body mass index is 28.1 kg/m.   DVT prophylaxis: enoxaparin (LOVENOX) injection 30 mg Start: 08/19/19 2215   Diet:  Diet Orders (From admission, onward)    Start     Ordered   08/20/19 1609  Diet regular  Room service appropriate? Yes; Fluid consistency: Thin  Diet effective now       Question Answer Comment  Room service appropriate? Yes   Fluid consistency: Thin      08/20/19 1608             Code Status: Full Code    Subjective 08/20/19    Patient seen at bedside this morning.  No acute events reported overnight.  She reports feeling very tired and being hot.  She denies chest pain or shortness of breath, abdominal pain, nausea or vomiting or other acute complaints.   Disposition Plan & Communication   Status is: Inpatient  Remains inpatient appropriate because:IV treatments appropriate due to intensity of illness or inability to take PO   Dispo: The patient is from: SNF              Anticipated d/c is to: SNF              Anticipated d/c date is: 3 days              Patient currently is not medically stable to d/c.        Family Communication: None at bedside during encounter, will attempt to call   Consults, Procedures, Significant Events   Consultants:   None  Procedures:   Foley placed in the ER 7/27  Antimicrobials:   Rocephin 7/27 >>     Objective   Vitals:   08/20/19 1500 08/20/19 1534 08/20/19 1600 08/20/19 1825  BP:  (!) 113/52 (!) 124/57 126/68  Pulse:  73 73 80  Resp: 21 21 19 23   Temp:  (!) 97.5 F (36.4 C)    TempSrc:  Axillary    SpO2:  100% 100% 100%  Weight:      Height:        Intake/Output Summary (Last 24 hours) at 08/20/2019 1938 Last data filed at 08/20/2019 1843 Gross per 24 hour  Intake 2595.72 ml  Output 3725 ml  Net -1129.28 ml   Filed Weights   08/19/19 1908 08/20/19 0044  Weight: 81.6 kg 74.3 kg    Physical Exam:  General exam: awake, alert, no acute distress, obese HEENT: moist mucus membranes, hearing grossly normal  Respiratory system: CTAB with diminished bases, no wheezes, rales or rhonchi, normal respiratory effort. Cardiovascular system: normal S1/S2, RRR, no pedal edema.   Gastrointestinal system: soft, NT, ND, +bowel sounds. Central nervous system: alert, oriented to self and place, no gross focal neurologic deficits, normal speech Extremities: moves all, no cyanosis, normal  tone Skin: Diaphoretic, warm to touch, pale Psychiatry: normal mood, flat affect  Labs   Data Reviewed: I have personally reviewed following labs and imaging studies  CBC: Recent Labs  Lab 08/19/19 1858 08/20/19 0420  WBC 17.9* 17.0*  NEUTROABS 16.5*  --   HGB 13.9 12.0  HCT 42.1 35.8*  MCV 94.2 92.3  PLT 415* 321   Basic Metabolic Panel: Recent Labs  Lab 08/19/19 1858 08/20/19 0420  NA 137 137  K 4.8 4.4  CL 104 110  CO2 19* 18*  GLUCOSE 125* 176*  BUN 74* 67*  CREATININE 2.43* 1.85*  CALCIUM 8.3* 7.8*   GFR: Estimated Creatinine Clearance: 27.9 mL/min (A) (by C-G formula based on SCr of 1.85 mg/dL (H)). Liver Function Tests: Recent Labs  Lab 08/19/19 1858  AST 271*  ALT 24  ALKPHOS 137*  BILITOT 1.2  PROT 7.8  ALBUMIN 2.6*   No results for input(s): LIPASE, AMYLASE  in the last 168 hours. No results for input(s): AMMONIA in the last 168 hours. Coagulation Profile: Recent Labs  Lab 08/20/19 0420  INR 1.3*   Cardiac Enzymes: Recent Labs  Lab 08/19/19 1858  CKTOTAL 22*   BNP (last 3 results) No results for input(s): PROBNP in the last 8760 hours. HbA1C: No results for input(s): HGBA1C in the last 72 hours. CBG: Recent Labs  Lab 08/19/19 1905 08/20/19 0745 08/20/19 1155 08/20/19 1617  GLUCAP 125* 140* 121* 123*   Lipid Profile: No results for input(s): CHOL, HDL, LDLCALC, TRIG, CHOLHDL, LDLDIRECT in the last 72 hours. Thyroid Function Tests: No results for input(s): TSH, T4TOTAL, FREET4, T3FREE, THYROIDAB in the last 72 hours. Anemia Panel: No results for input(s): VITAMINB12, FOLATE, FERRITIN, TIBC, IRON, RETICCTPCT in the last 72 hours. Sepsis Labs: Recent Labs  Lab 08/19/19 1858 08/20/19 0420  PROCALCITON 4.62 3.30  LATICACIDVEN 2.5* 1.9    Recent Results (from the past 240 hour(s))  Blood Culture (routine x 2)     Status: None (Preliminary result)   Collection Time: 08/19/19  6:59 PM   Specimen: BLOOD  Result Value Ref Range  Status   Specimen Description BLOOD RIGHT ANTECUBITAL  Final   Special Requests   Final    BOTTLES DRAWN AEROBIC AND ANAEROBIC Blood Culture adequate volume   Culture  Setup Time   Final    GRAM NEGATIVE RODS IN BOTH AEROBIC AND ANAEROBIC BOTTLES CRITICAL VALUE NOTED.  VALUE IS CONSISTENT WITH PREVIOUSLY REPORTED AND CALLED VALUE. Performed at [facility], [address] [place], [place] [address]    Culture GRAM NEGATIVE RODS  Final   Report Status PENDING  Incomplete  Blood Culture (routine x 2)     Status: None (Preliminary result)   Collection Time: 08/19/19  7:03 PM   Specimen: BLOOD  Result Value Ref Range Status   Specimen Description   Final    BLOOD LEFT ANTECUBITAL Performed at [facility], [address] [place], [place] [address]    Special Requests   Final    BOTTLES DRAWN AEROBIC AND ANAEROBIC Blood Culture adequate volume Performed at [facility], [address] [place], [place] [address]    Culture  Setup Time   Final    GRAM NEGATIVE RODS IN BOTH AEROBIC AND ANAEROBIC BOTTLES CRITICAL RESULT CALLED TO, READ BACK BY AND VERIFIED WITH: MYRA SLAUGHTER AT 5188 ON 08/20/2019 [place]. Performed at [facility], [place] [address], [place], [place] [address]    Culture GRAM NEGATIVE RODS  Final   Report Status PENDING  Incomplete  Blood Culture ID Panel (Reflexed)     Status: Abnormal   Collection Time: 08/19/19  7:03 PM  Result Value Ref Range Status   Enterococcus species NOT DETECTED NOT DETECTED Final   Listeria monocytogenes NOT DETECTED NOT DETECTED Final   Staphylococcus species NOT DETECTED NOT DETECTED Final   Staphylococcus aureus (BCID) NOT DETECTED NOT DETECTED Final   Streptococcus species NOT DETECTED NOT DETECTED Final   Streptococcus agalactiae NOT DETECTED NOT DETECTED Final   Streptococcus pneumoniae NOT DETECTED NOT DETECTED Final   Streptococcus pyogenes NOT DETECTED NOT DETECTED Final   Acinetobacter baumannii  NOT DETECTED NOT DETECTED Final   Enterobacteriaceae species DETECTED (A) NOT DETECTED Final    Comment: Enterobacteriaceae represent a large family of gram-negative bacteria, not a single organism. CRITICAL RESULT CALLED TO, READ BACK BY AND VERIFIED WITH: MYRA SLAUGHTER AT 6301 ON 08/20/2019 [place].  Enterobacter cloacae complex NOT DETECTED NOT DETECTED Final   Escherichia coli NOT DETECTED NOT DETECTED Final   Klebsiella oxytoca NOT DETECTED NOT DETECTED Final   Klebsiella pneumoniae NOT DETECTED NOT DETECTED Final   Proteus species DETECTED (A) NOT DETECTED Final    Comment: CRITICAL RESULT CALLED TO, READ BACK BY AND VERIFIED WITH: MYRA SLAUGHTER AT 0824 ON 08/20/2019 [place].    Serratia marcescens NOT DETECTED NOT DETECTED Final   Carbapenem resistance NOT DETECTED NOT DETECTED Final   Haemophilus influenzae NOT DETECTED NOT DETECTED Final   Neisseria meningitidis NOT DETECTED NOT DETECTED Final   Pseudomonas aeruginosa NOT DETECTED NOT DETECTED Final   Candida albicans NOT DETECTED NOT DETECTED Final   Candida glabrata NOT DETECTED NOT DETECTED Final   Candida krusei NOT DETECTED NOT DETECTED Final   Candida parapsilosis NOT DETECTED NOT DETECTED Final   Candida tropicalis NOT DETECTED NOT DETECTED Final    Comment: Performed at [facility], [address] [place], [place] [address]  SARS Coronavirus 2 by RT PCR (hospital order, performed in [place] hospital lab) Nasopharyngeal Nasopharyngeal Swab     Status: None   Collection Time: 08/19/19  9:15 PM   Specimen: Nasopharyngeal Swab  Result Value Ref Range Status   SARS Coronavirus 2 NEGATIVE NEGATIVE Final    Comment: (NOTE) SARS-CoV-2 target nucleic acids are NOT DETECTED.  The SARS-CoV-2 RNA is generally detectable in upper and lower respiratory specimens during the acute phase of infection. The lowest concentration of SARS-CoV-2 viral copies this assay can detect is 250 copies / mL. A negative result does not  preclude SARS-CoV-2 infection and should not be used as the sole basis for treatment or other patient management decisions.  A negative result may occur with improper specimen collection / handling, submission of specimen other than nasopharyngeal swab, presence of viral mutation(s) within the areas targeted by this assay, and inadequate number of viral copies (<250 copies / mL). A negative result must be combined with clinical observations, patient history, and epidemiological information.  Fact Sheet for Patients:   StrictlyIdeas.no  Fact Sheet for Healthcare Providers: BankingDealers.co.za  This test is not yet approved or  cleared by the [place] FDA and has been authorized for detection and/or diagnosis of SARS-CoV-2 by FDA under an Emergency Use Authorization (EUA).  This EUA will remain in effect (meaning this test can be used) for the duration of the COVID-19 declaration under Section 564(b)(1) of the Act, 21 U.S.C. section 360bbb-3(b)(1), unless the authorization is terminated or revoked sooner.  Performed at [facility], [address] [place], [place] [address]   MRSA PCR Screening     Status: None   Collection Time: 08/20/19  5:55 AM   Specimen: Nasopharyngeal  Result Value Ref Range Status   MRSA by PCR NEGATIVE NEGATIVE Final    Comment:        The GeneXpert MRSA Assay (FDA approved for NASAL specimens only), is one component of a comprehensive MRSA colonization surveillance program. It is not intended to diagnose MRSA infection nor to guide or monitor treatment for MRSA infections. Performed at [facility], [address] [place], [place] [address]       Imaging Studies   CT ABDOMEN PELVIS WO CONTRAST  Result Date: 08/19/2019 CLINICAL DATA:  69 year old female with abdominal pain. Concern for bowel obstruction. EXAM: CT CHEST, ABDOMEN AND PELVIS WITHOUT CONTRAST TECHNIQUE:  Multidetector CT imaging of the chest, abdomen and pelvis was performed following the standard  protocol without IV contrast. COMPARISON:  Chest radiograph dated 08/19/2019. FINDINGS: Evaluation of this exam is limited in the absence of intravenous contrast. CT CHEST FINDINGS Cardiovascular: There is no cardiomegaly or pericardial effusion. Coronary vascular calcification of the LAD. Mild atherosclerotic calcification of the thoracic aorta. The central pulmonary arteries are grossly unremarkable on this noncontrast CT. Mediastinum/Nodes: There is no hilar or mediastinal adenopathy. The esophagus and the thyroid gland are grossly unremarkable as visualized. No mediastinal fluid collection. Lungs/Pleura: Mild pleural thickening versus minimal right pleural effusion. Right lung base linear atelectasis/scarring. No focal consolidation, or pneumothorax. The central airways are patent. Musculoskeletal: Osteopenia with scoliosis and degenerative changes of the spine. Old healed left posterior rib fractures. No acute osseous pathology. CT ABDOMEN PELVIS FINDINGS No intra-abdominal free air or free fluid. Hepatobiliary: Several small hepatic hypodense lesions are not characterized on this noncontrast CT. Cholecystectomy. Pancreas: The pancreas is grossly unremarkable. Spleen: Normal in size without focal abnormality. Adrenals/Urinary Tract: The adrenal glands are unremarkable. Mild bilateral hydronephrosis, left greater right. Several nonobstructing left renal calculi measure up to 4 mm. There is left perinephric stranding concerning for pyelonephritis. Correlation with urinalysis recommended. The urinary bladder is distended. There is haziness of the bladder wall with perivesical stranding. Stomach/Bowel: There is no bowel obstruction or active inflammation. The appendix is normal. Vascular/Lymphatic: Moderate aortoiliac atherosclerotic disease. The IVC is grossly unremarkable. No portal venous gas. There is no adenopathy.  Reproductive: Hysterectomy. Other: Mild diffuse subcutaneous edema. No fluid collection. Musculoskeletal: There is severe osteopenia, scoliosis, and degenerative changes of the spine and hips. There are compression fractures of the superior endplate of L4 and inferior endplate of L3, likely acute. Clinical correlation is recommended. There is approximately 25% loss of vertebral body height at L4. There is disc desiccation at L2-L3 and L3-L4. Bilateral L5 pars defects with grade 1 L5-S1 anterolisthesis. IMPRESSION: 1. Compression fractures of the superior endplate of L4 and inferior endplate of L3, likely acute. Clinical correlation is recommended. 2. Distended urinary bladder with mild bilateral hydronephrosis and findings concerning for pyelonephritis. Correlation with urinalysis recommended. 3. Several nonobstructing left renal calculi.  No obstructing stone. 4. No bowel obstruction. Normal appendix. 5. Aortic Atherosclerosis (ICD10-I70.0). Electronically Signed   By: Anner Crete M.D.   On: 08/19/2019 20:15   CT Head Wo Contrast  Result Date: 08/19/2019 CLINICAL DATA:  Altered mental status EXAM: CT HEAD WITHOUT CONTRAST CT CERVICAL SPINE WITHOUT CONTRAST TECHNIQUE: Multidetector CT imaging of the head and cervical spine was performed following the standard protocol without intravenous contrast. Multiplanar CT image reconstructions of the cervical spine were also generated. COMPARISON:  CT brain and cervical spine 08/05/2019 FINDINGS: CT HEAD FINDINGS Brain: No acute territorial infarction, hemorrhage or intracranial mass. Atrophy. Mild chronic small vessel ischemic change of the white matter. Stable ventricle size. Vascular: No hyperdense vessels. Scattered carotid vascular calcification. Skull: Normal. Negative for fracture or focal lesion. Sinuses/Orbits: No acute finding. Other: None CT CERVICAL SPINE FINDINGS Alignment: No subluxation.  Facet alignment is maintained. Skull base and vertebrae: No  acute fracture. No primary bone lesion or focal pathologic process. Soft tissues and spinal canal: No prevertebral fluid or swelling. No visible canal hematoma. Disc levels: Mild degenerative changes at C5-C6, C6-C7 and C7-T1. Facet degenerative changes at multiple levels. Upper chest: Negative. Other: None IMPRESSION: 1. No CT evidence for acute intracranial abnormality. Atrophy and mild chronic small vessel ischemic change of the white matter. 2. No acute osseous abnormality of the cervical spine Electronically Signed  By: Donavan Foil M.D.   On: 08/19/2019 20:28   CT Chest Wo Contrast  Result Date: 08/19/2019 CLINICAL DATA:  69 year old female with abdominal pain. Concern for bowel obstruction. EXAM: CT CHEST, ABDOMEN AND PELVIS WITHOUT CONTRAST TECHNIQUE: Multidetector CT imaging of the chest, abdomen and pelvis was performed following the standard protocol without IV contrast. COMPARISON:  Chest radiograph dated 08/19/2019. FINDINGS: Evaluation of this exam is limited in the absence of intravenous contrast. CT CHEST FINDINGS Cardiovascular: There is no cardiomegaly or pericardial effusion. Coronary vascular calcification of the LAD. Mild atherosclerotic calcification of the thoracic aorta. The central pulmonary arteries are grossly unremarkable on this noncontrast CT. Mediastinum/Nodes: There is no hilar or mediastinal adenopathy. The esophagus and the thyroid gland are grossly unremarkable as visualized. No mediastinal fluid collection. Lungs/Pleura: Mild pleural thickening versus minimal right pleural effusion. Right lung base linear atelectasis/scarring. No focal consolidation, or pneumothorax. The central airways are patent. Musculoskeletal: Osteopenia with scoliosis and degenerative changes of the spine. Old healed left posterior rib fractures. No acute osseous pathology. CT ABDOMEN PELVIS FINDINGS No intra-abdominal free air or free fluid. Hepatobiliary: Several small hepatic hypodense lesions are  not characterized on this noncontrast CT. Cholecystectomy. Pancreas: The pancreas is grossly unremarkable. Spleen: Normal in size without focal abnormality. Adrenals/Urinary Tract: The adrenal glands are unremarkable. Mild bilateral hydronephrosis, left greater right. Several nonobstructing left renal calculi measure up to 4 mm. There is left perinephric stranding concerning for pyelonephritis. Correlation with urinalysis recommended. The urinary bladder is distended. There is haziness of the bladder wall with perivesical stranding. Stomach/Bowel: There is no bowel obstruction or active inflammation. The appendix is normal. Vascular/Lymphatic: Moderate aortoiliac atherosclerotic disease. The IVC is grossly unremarkable. No portal venous gas. There is no adenopathy. Reproductive: Hysterectomy. Other: Mild diffuse subcutaneous edema. No fluid collection. Musculoskeletal: There is severe osteopenia, scoliosis, and degenerative changes of the spine and hips. There are compression fractures of the superior endplate of L4 and inferior endplate of L3, likely acute. Clinical correlation is recommended. There is approximately 25% loss of vertebral body height at L4. There is disc desiccation at L2-L3 and L3-L4. Bilateral L5 pars defects with grade 1 L5-S1 anterolisthesis. IMPRESSION: 1. Compression fractures of the superior endplate of L4 and inferior endplate of L3, likely acute. Clinical correlation is recommended. 2. Distended urinary bladder with mild bilateral hydronephrosis and findings concerning for pyelonephritis. Correlation with urinalysis recommended. 3. Several nonobstructing left renal calculi.  No obstructing stone. 4. No bowel obstruction. Normal appendix. 5. Aortic Atherosclerosis (ICD10-I70.0). Electronically Signed   By: Anner Crete M.D.   On: 08/19/2019 20:15   CT Cervical Spine Wo Contrast  Result Date: 08/19/2019 CLINICAL DATA:  Altered mental status EXAM: CT HEAD WITHOUT CONTRAST CT CERVICAL  SPINE WITHOUT CONTRAST TECHNIQUE: Multidetector CT imaging of the head and cervical spine was performed following the standard protocol without intravenous contrast. Multiplanar CT image reconstructions of the cervical spine were also generated. COMPARISON:  CT brain and cervical spine 08/05/2019 FINDINGS: CT HEAD FINDINGS Brain: No acute territorial infarction, hemorrhage or intracranial mass. Atrophy. Mild chronic small vessel ischemic change of the white matter. Stable ventricle size. Vascular: No hyperdense vessels. Scattered carotid vascular calcification. Skull: Normal. Negative for fracture or focal lesion. Sinuses/Orbits: No acute finding. Other: None CT CERVICAL SPINE FINDINGS Alignment: No subluxation.  Facet alignment is maintained. Skull base and vertebrae: No acute fracture. No primary bone lesion or focal pathologic process. Soft tissues and spinal canal: No prevertebral fluid or swelling. No visible canal  hematoma. Disc levels: Mild degenerative changes at C5-C6, C6-C7 and C7-T1. Facet degenerative changes at multiple levels. Upper chest: Negative. Other: None IMPRESSION: 1. No CT evidence for acute intracranial abnormality. Atrophy and mild chronic small vessel ischemic change of the white matter. 2. No acute osseous abnormality of the cervical spine Electronically Signed   By: Donavan Foil M.D.   On: 08/19/2019 20:28   DG Chest Port 1 View  Result Date: 08/19/2019 CLINICAL DATA:  Change in mental status EXAM: PORTABLE CHEST 1 VIEW COMPARISON:  Jun 10, 2017 FINDINGS: The heart size and mediastinal contours are within normal limits. Aortic knob calcifications are seen. There appears to be a 2 cm rounded nodule seen within the right perihilar region. No large airspace consolidation seen within the left lung. No pleural effusion. No acute osseous abnormality. IMPRESSION: 2 cm rounded nodule within the right perihilar region not clearly identified on the prior exam. Would recommend dedicated  nonemergent chest CT for further evaluation. Electronically Signed   By: Prudencio Pair M.D.   On: 08/19/2019 19:21     Medications   Scheduled Meds: . Chlorhexidine Gluconate Cloth  6 each Topical Q0600  . enoxaparin (LOVENOX) injection  30 mg Subcutaneous Q24H  . feeding supplement (ENSURE ENLIVE)  237 mL Oral TID BM  . insulin aspart  0-15 Units Subcutaneous TID WC  . insulin aspart  0-5 Units Subcutaneous QHS  . [START ON 08/21/2019] multivitamin with minerals  1 tablet Oral Daily   Continuous Infusions: . sodium chloride 125 mL/hr at 08/20/19 1839  . cefTRIAXone (ROCEPHIN)  IV Stopped (08/20/19 1133)  . sodium chloride         LOS: 1 day    Time spent: 30 minutes    Ezekiel Slocumb, DO [facility]  08/20/2019, 7:38 PM    If 7PM-7AM, please contact night-coverage. How to contact the [facility] Attending or Consulting provider [place] or covering provider during after hours [place], for this patient?    1. Check the care team in [facility] and look for a) attending/consulting [facility] provider listed and b) the [facility] team listed 2. Log into [email or web address] and use [facility]'s universal password to access. If you do not have the password, please contact the hospital operator. 3. Locate the [facility] provider you are looking for under [facility] and page to a number that you can be directly reached. 4. If you still have difficulty reaching the provider, please page the [facility] (Director on Call) for the Hospitalists listed on amion for assistance.

## 2019-08-20 NOTE — Progress Notes (Signed)
Initial Nutrition Assessment  DOCUMENTATION CODES:   Non-severe (moderate) malnutrition in context of chronic illness  INTERVENTION:   Ensure Enlive po TID, each supplement provides 350 kcal and 20 grams of protein  MVI daily   Liberalize diet   NUTRITION DIAGNOSIS:   Moderate Malnutrition related to chronic illness (parkinsons, dementia) as evidenced by mild fat depletion, mild muscle depletion, 18 percent weight loss in < 1 year.  GOAL:   Patient will meet greater than or equal to 90% of their needs  MONITOR:   PO intake, Supplement acceptance, Labs, Weight trends, Skin, I & O's  REASON FOR ASSESSMENT:   Malnutrition Screening Tool    ASSESSMENT:   69 y.o. female with medical history significant for DM, GERD, anxiety, OSA, Parkinson's with dementia, hypothyroidism, frequent falls, brought in by EMS after home reported shortness of breath and altered mental status. Pt found to have pyelonephritis and sepsis  Met with pt in room today. Pt with some confusion today. Pt reports that she does not feel good and keeps repeating "get me out of here". Pt's lunch tray was sitting on her side table with only bites gone from it. Pt is able to tell me that she drinks vanilla Ensure at home. Pt was eating well during her last admit ~ 2 weeks ago. Per chart, pt is down 35lbs(18%) in < 1 year; this is significant. Pt does have some mild depletions on NFPE today and is noted to have hair loss. RD will add supplements and MVI to help pt meet her estimated needs and support wound healing. RD will also liberalize pt's diet.   Medications reviewed and include: lovenox, insulin, ceftriaxone, NaCl @125ml /hr  Labs reviewed: BUN 67(H), creat 1.85(H) Wbc- 17.0(H) cbgs- 140, 121 x 24 hrs AIC 5.5- 7/13  NUTRITION - FOCUSED PHYSICAL EXAM:    Most Recent Value  Orbital Region Mild depletion  Upper Arm Region No depletion  Thoracic and Lumbar Region Mild depletion  Buccal Region Mild depletion    Temple Region Moderate depletion  Clavicle Bone Region Mild depletion  Clavicle and Acromion Bone Region Mild depletion  Scapular Bone Region No depletion  Dorsal Hand No depletion  Patellar Region Mild depletion  Anterior Thigh Region Mild depletion  Posterior Calf Region Mild depletion  Edema (RD Assessment) None  Hair Reviewed  Eyes Reviewed  Mouth Reviewed  Skin Reviewed  [thinning]  Nails Reviewed     Diet Order:   Diet Order            Diet regular Room service appropriate? Yes; Fluid consistency: Thin  Diet effective now                EDUCATION NEEDS:   No education needs have been identified at this time  Skin:  Skin Assessment: Reviewed RN Assessment (ecchymosis, MASD, Stage II coccyx)  Last BM:  pta  Height:   Ht Readings from Last 1 Encounters:  08/19/19 5' 4"  (1.626 m)    Weight:   Wt Readings from Last 1 Encounters:  08/20/19 74.3 kg    Ideal Body Weight:  54.5 kg  BMI:  Body mass index is 28.1 kg/m.  Estimated Nutritional Needs:   Kcal:  1700-1900kcal/day  Protein:  80-90g/day  Fluid:  1.4-1.7L/day  Koleen Distance MS, RD, LDN Please refer to [facility] for RD and/or RD on-call/weekend/after hours pager

## 2019-08-21 LAB — CBC WITH DIFFERENTIAL/PLATELET
Abs Immature Granulocytes: 0.18 10*3/uL — ABNORMAL HIGH (ref 0.00–0.07)
Basophils Absolute: 0 10*3/uL (ref 0.0–0.1)
Basophils Relative: 0 %
Eosinophils Absolute: 0 10*3/uL (ref 0.0–0.5)
Eosinophils Relative: 0 %
HCT: 31.2 % — ABNORMAL LOW (ref 36.0–46.0)
Hemoglobin: 11 g/dL — ABNORMAL LOW (ref 12.0–15.0)
Immature Granulocytes: 1 %
Lymphocytes Relative: 6 %
Lymphs Abs: 1 10*3/uL (ref 0.7–4.0)
MCH: 32.3 pg (ref 26.0–34.0)
MCHC: 35.3 g/dL (ref 30.0–36.0)
MCV: 91.5 fL (ref 80.0–100.0)
Monocytes Absolute: 0.5 10*3/uL (ref 0.1–1.0)
Monocytes Relative: 3 %
Neutro Abs: 14.3 10*3/uL — ABNORMAL HIGH (ref 1.7–7.7)
Neutrophils Relative %: 90 %
Platelets: 242 10*3/uL (ref 150–400)
RBC: 3.41 MIL/uL — ABNORMAL LOW (ref 3.87–5.11)
RDW: 13.7 % (ref 11.5–15.5)
WBC: 16 10*3/uL — ABNORMAL HIGH (ref 4.0–10.5)
nRBC: 0 % (ref 0.0–0.2)

## 2019-08-21 LAB — BASIC METABOLIC PANEL
Anion gap: 8 (ref 5–15)
BUN: 48 mg/dL — ABNORMAL HIGH (ref 8–23)
CO2: 18 mmol/L — ABNORMAL LOW (ref 22–32)
Calcium: 7.7 mg/dL — ABNORMAL LOW (ref 8.9–10.3)
Chloride: 114 mmol/L — ABNORMAL HIGH (ref 98–111)
Creatinine, Ser: 1.09 mg/dL — ABNORMAL HIGH (ref 0.44–1.00)
GFR calc Af Amer: 60 mL/min — ABNORMAL LOW (ref >60)
GFR calc non Af Amer: 51 mL/min — ABNORMAL LOW (ref >60)
Glucose, Bld: 117 mg/dL — ABNORMAL HIGH (ref 70–99)
Potassium: 4.3 mmol/L (ref 3.5–5.1)
Sodium: 140 mmol/L (ref 135–145)

## 2019-08-21 LAB — HEPATIC FUNCTION PANEL
ALT: 64 U/L — ABNORMAL HIGH (ref 0–44)
AST: 103 U/L — ABNORMAL HIGH (ref 15–41)
Albumin: 1.8 g/dL — ABNORMAL LOW (ref 3.5–5.0)
Alkaline Phosphatase: 94 U/L (ref 38–126)
Bilirubin, Direct: 0.3 mg/dL — ABNORMAL HIGH (ref 0.0–0.2)
Indirect Bilirubin: 0.7 mg/dL (ref 0.3–0.9)
Total Bilirubin: 1 mg/dL (ref 0.3–1.2)
Total Protein: 6.2 g/dL — ABNORMAL LOW (ref 6.5–8.1)

## 2019-08-21 LAB — GLUCOSE, CAPILLARY
Glucose-Capillary: 204 mg/dL — ABNORMAL HIGH (ref 70–99)
Glucose-Capillary: 110 mg/dL — ABNORMAL HIGH (ref 70–99)
Glucose-Capillary: 110 mg/dL — ABNORMAL HIGH (ref 70–99)
Glucose-Capillary: 110 mg/dL — ABNORMAL HIGH (ref 70–99)
Glucose-Capillary: 176 mg/dL — ABNORMAL HIGH (ref 70–99)

## 2019-08-21 LAB — MAGNESIUM: Magnesium: 1.8 mg/dL (ref 1.7–2.4)

## 2019-08-21 MED ORDER — SERTRALINE HCL 50 MG PO TABS
50.0000 mg | ORAL_TABLET | Freq: Every day | ORAL | Status: DC
  Administered 2019-08-21 – 2019-09-03 (×14): 50 mg via ORAL
  Filled 2019-08-21 (×14): qty 1

## 2019-08-21 MED ORDER — ENOXAPARIN SODIUM 40 MG/0.4ML ~~LOC~~ SOLN
40.0000 mg | SUBCUTANEOUS | Status: DC
  Administered 2019-08-21 – 2019-08-24 (×4): 40 mg via SUBCUTANEOUS
  Filled 2019-08-21 (×4): qty 0.4

## 2019-08-21 MED ORDER — CARBIDOPA-LEVODOPA ER 50-200 MG PO TBCR
1.0000 | EXTENDED_RELEASE_TABLET | Freq: Two times a day (BID) | ORAL | Status: DC
  Administered 2019-08-21 – 2019-09-03 (×27): 1 via ORAL
  Filled 2019-08-21 (×29): qty 1

## 2019-08-21 MED ORDER — CELECOXIB 200 MG PO CAPS
200.0000 mg | ORAL_CAPSULE | Freq: Two times a day (BID) | ORAL | Status: DC | PRN
  Administered 2019-08-26 – 2019-09-01 (×10): 200 mg via ORAL
  Filled 2019-08-21 (×13): qty 1

## 2019-08-21 MED ORDER — ALPRAZOLAM 0.5 MG PO TABS
0.5000 mg | ORAL_TABLET | Freq: Every evening | ORAL | Status: DC | PRN
  Administered 2019-08-22: 0.5 mg via ORAL
  Filled 2019-08-21 (×2): qty 1

## 2019-08-21 MED ORDER — ALPRAZOLAM 0.25 MG PO TABS
0.2500 mg | ORAL_TABLET | Freq: Every morning | ORAL | Status: DC
  Administered 2019-08-21 – 2019-09-03 (×15): 0.25 mg via ORAL
  Filled 2019-08-21 (×15): qty 1

## 2019-08-21 MED ORDER — CARBIDOPA-LEVODOPA 25-100 MG PO TABS: 2.0000 | ORAL_TABLET | Freq: Four times a day (QID) | ORAL | Status: DC

## 2019-08-21 MED ORDER — ROPINIROLE HCL ER 2 MG PO TB24: 2.0000 mg | ORAL_TABLET | Freq: Every day | ORAL | Status: DC

## 2019-08-21 MED ORDER — PANTOPRAZOLE SODIUM 40 MG PO TBEC
40.0000 mg | DELAYED_RELEASE_TABLET | Freq: Every day | ORAL | Status: DC
  Administered 2019-08-21 – 2019-09-03 (×14): 40 mg via ORAL
  Filled 2019-08-21 (×14): qty 1

## 2019-08-21 MED ORDER — ADULT MULTIVITAMIN W/MINERALS CH
1.0000 | ORAL_TABLET | Freq: Every day | ORAL | Status: DC
  Administered 2019-08-21 – 2019-08-26 (×5): 1 via ORAL
  Filled 2019-08-21 (×5): qty 1

## 2019-08-21 MED ORDER — MIRTAZAPINE 15 MG PO TABS
7.5000 mg | ORAL_TABLET | Freq: Every day | ORAL | Status: DC
  Administered 2019-08-22 – 2019-09-02 (×12): 7.5 mg via ORAL
  Filled 2019-08-21 (×13): qty 1

## 2019-08-21 MED ORDER — CALCIUM CARBONATE-VITAMIN D 500-200 MG-UNIT PO TABS
1.0000 | ORAL_TABLET | Freq: Every day | ORAL | Status: DC
  Administered 2019-08-21 – 2019-09-03 (×15): 1 via ORAL
  Filled 2019-08-21 (×15): qty 1

## 2019-08-21 MED ORDER — CITALOPRAM HYDROBROMIDE 20 MG PO TABS
40.0000 mg | ORAL_TABLET | Freq: Every day | ORAL | Status: DC
  Administered 2019-08-21 – 2019-09-03 (×15): 40 mg via ORAL
  Filled 2019-08-21 (×15): qty 2

## 2019-08-21 MED ORDER — MELATONIN 5 MG PO TABS
5.0000 mg | ORAL_TABLET | Freq: Every evening | ORAL | Status: DC | PRN
  Administered 2019-08-23 – 2019-08-27 (×2): 5 mg via ORAL
  Filled 2019-08-21 (×2): qty 1

## 2019-08-21 MED ORDER — LEVOTHYROXINE SODIUM 50 MCG PO TABS
175.0000 ug | ORAL_TABLET | Freq: Every day | ORAL | Status: DC
  Administered 2019-08-21 – 2019-08-31 (×10): 175 ug via ORAL
  Filled 2019-08-21 (×11): qty 1

## 2019-08-21 MED ORDER — LACTATED RINGERS IV SOLN: INTRAVENOUS | Status: DC

## 2019-08-21 MED ORDER — ROPINIROLE HCL 1 MG PO TABS
1.0000 mg | ORAL_TABLET | Freq: Every day | ORAL | Status: DC
  Administered 2019-08-21 – 2019-09-02 (×13): 1 mg via ORAL
  Filled 2019-08-21 (×14): qty 1

## 2019-08-21 MED ORDER — TAMSULOSIN HCL 0.4 MG PO CAPS
0.4000 mg | ORAL_CAPSULE | Freq: Every day | ORAL | Status: DC
  Administered 2019-08-22 – 2019-09-02 (×11): 0.4 mg via ORAL
  Filled 2019-08-21 (×12): qty 1

## 2019-08-21 MED ORDER — SIMVASTATIN 20 MG PO TABS
40.0000 mg | ORAL_TABLET | Freq: Every day | ORAL | Status: DC
  Administered 2019-08-21 – 2019-09-02 (×13): 40 mg via ORAL
  Filled 2019-08-21 (×13): qty 2

## 2019-08-21 MED ORDER — DOCUSATE SODIUM 100 MG PO CAPS
100.0000 mg | ORAL_CAPSULE | Freq: Two times a day (BID) | ORAL | Status: DC
  Administered 2019-08-21 – 2019-09-03 (×24): 100 mg via ORAL
  Filled 2019-08-21 (×25): qty 1

## 2019-08-21 MED ORDER — ONDANSETRON 4 MG PO TBDP: 4.0000 mg | ORAL_TABLET | Freq: Three times a day (TID) | ORAL | Status: DC | PRN

## 2019-08-21 MED ORDER — ROPINIROLE HCL 1 MG PO TABS
0.5000 mg | ORAL_TABLET | Freq: Two times a day (BID) | ORAL | Status: DC
  Administered 2019-08-21 – 2019-09-03 (×25): 0.5 mg via ORAL
  Filled 2019-08-21 (×24): qty 1

## 2019-08-21 NOTE — Progress Notes (Addendum)
PHARMACIST - PHYSICIAN COMMUNICATION  CONCERNING:  Enoxaparin (Lovenox) for DVT Prophylaxis    RECOMMENDATION: Patient was prescribed enoxaparin 30 mg q24 hours for VTE prophylaxis.   Filed Weights   08/19/19 1908 08/20/19 0044  Weight: 81.6 kg (180 lb) 74.3 kg (163 lb 11.2 oz)    Body mass index is 28.1 kg/m.  Estimated Creatinine Clearance: 47.4 mL/min (A) (by C-G formula based on SCr of 1.09 mg/dL (H)).  Patient is candidate for enoxaparin 40 mg every 24 hours based on CrCl > 4ml/min or Weight > then 45kg for women or > 57kg for men   DESCRIPTION: Pharmacy has adjusted enoxaparin dose per [facility]/[place] policy.  Patient is now receiving enoxaparin 40 mg every 24 hours.  Benita Gutter 08/21/2019 10:51 AM

## 2019-08-21 NOTE — Progress Notes (Addendum)
PROGRESS NOTE    [name]   WLN:[id number]  DOB: February 20, 1950  PCP: Ronnell Freshwater, NP    DOA: 08/19/2019 LOS: 2   Brief Narrative   Theresa Mills is a 69 y.o. female with medical history of DM, GERD, anxiety, OSA, Parkinson's with dementia, hypothyroidism, frequent falls, brought in 08/19/19 by EMS after SNF staff called due to shortness of breath and altered mental status.  Patient complained of abdominal pain and neck pain but could not provide additional history due to dementia and altered mental status.  In the ED, patient was lethargic, afebrile, hypotensive with BP 83/41, HR 90, RR 24, O2 sat 93% on room air.  Labs were notable for leukocytosis 18k, lactic acidosis 2.5.  UA showed pyuria.  Creatinine was 2.43 (baseline 0.69), procalcitonin 4.62, troponin 28.  CT abdomen pelvis showed distended urinary bladder with bilateral hydronephrosis and findings concerning for pyelonephritis, several nonobstructing left renal calculi.  No focal consolidation was seen on CT chest.  EKG was normal sinus rhythm.  Patient's blood pressure improved with IV fluid bolus.  Started on IV antibiotics and admitted to hospitalist service for further evaluation and management.     Assessment & Plan   Principal Problem:   Severe sepsis ([place]) Active Problems:   Dementia due to Parkinson's disease without behavioral disturbance ([place])   Diabetes mellitus type 2, uncomplicated (HCC)   Essential hypertension   Pressure injury of coccygeal region, stage 2 (HCC)   Acquired hypothyroidism   Bilateral hydronephrosis   Acute pyelonephritis   Acute metabolic encephalopathy   Acute urinary retention   AKI (acute kidney injury) ([place])   Malnutrition of moderate degree   Severe sepsis secondary to acute pyelonephritis and bacteremia - present on admission with leukocytosis, tachycardia, hypotension that was fluid responsive, lactic acidosis and AKI.  Imaging findings and UA are consistent with  pyelonephritis.   Sepsis physiology has improved and lactic acidosis resolved.  Blood cultures growing Proteus and Enterobacteriaceae spp.  Increase Rocephin to 2 g IV daily.  Follow-up culture sensitivities.  Continue maintenance IV fluids at 75 cc/h until her oral intake improves.  Monitor respiratory and volume status closely.  Maintain MAP over 65 with additional IV fluids as needed.  Bilateral hydronephrosis secondary to acute urinary retention -present on admission, CT abdomen showed distended bladder and bilateral hydronephrosis.  Foley catheter was placed in the ER.  Will start Flomax today, and do voiding trial tomorrow.  Possible urology consult.  Acute kidney injury -Improving with Foley and IV fluids.  Present on admission with creatinine 2.43, prior baseline 0.69.  Likely multifactorial with sepsis and hypotension causing prerenal azotemia and post renal secondary to hydronephrosis and acute urinary retention.  Renal function improving.  Daily BMP to monitor.  Avoid nephrotoxins and hypotension.  Renally dose meds as indicated.  Acute metabolic encephalopathy -present on admission likely secondary to infection on top of baseline dementia.  Expect improvement with antibiotics as above.  Monitor closely.  Transaminitis -present on admission, improving.  AST 271>>103, ALT 24>>64, alk phos 137>>94, normal total bili 1.2>>1.0.  RUQ ultrasound pending.  Suspect this is due to severe sepsis and shock liver.  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  Continue Sinemet, ropinirole  Essential hypertension -not on antihypertensives outpatient  Acquired hypothyroidism -continue levothyroxine  Hyperlipidemia -continue simvastatin  Depression/anxiety -continue Zoloft, mirtazapine, Celexa, and Xanax  Insomnia -continue melatonin   Patient BMI: Body  mass index is 28.1 kg/m.   DVT prophylaxis: enoxaparin  (LOVENOX) injection 30 mg Start: 08/19/19 2215   Diet:  Diet Orders (From admission, onward)    Start     Ordered   08/20/19 1609  Diet regular Room service appropriate? Yes; Fluid consistency: Thin  Diet effective now       Question Answer Comment  Room service appropriate? Yes   Fluid consistency: Thin      08/20/19 1608            Code Status: Full Code    Subjective 08/21/19    Patient seen at bedside this morning.  No acute events reported overnight.  She says she is very thirsty, requesting water.  She otherwise is feeling a little better today.  Denies fevers or chills, chest pain or shortness of breath, nausea vomiting.  Disposition Plan & Communication   Status is: Inpatient  Remains inpatient appropriate because:IV treatments appropriate due to intensity of illness or inability to take PO   Dispo: The patient is from: SNF              Anticipated d/c is to: SNF              Anticipated d/c date is: 3 days              Patient currently is not medically stable to d/c.    Family Communication: None at bedside during encounter, will attempt to call   Consults, Procedures, Significant Events   Consultants:   None  Procedures:   Foley placed in the ER 7/27  Antimicrobials:   Rocephin 7/27 >>     Objective   Vitals:   08/20/19 1600 08/20/19 1825 08/20/19 2004 08/21/19 0755  BP: (!) 124/57 126/68 (!) 124/57 (!) 90/47  Pulse: 73 80 76   Resp: _0 Temp:   99.9 F (37.7 C) 97.6 F (36.4 C)  TempSrc:   Oral Oral  SpO2: 100% 100% 100%   Weight:      Height:        Intake/Output Summary (Last 24 hours) at 08/21/2019 0901 Last data filed at 08/20/2019 2300 Gross per 24 hour  Intake 1997.41 ml  Output 1050 ml  Net 947.41 ml   Filed Weights   08/19/19 1908 08/20/19 0044  Weight: 81.6 kg 74.3 kg    Physical Exam:  General exam: awake, alert, no acute distress, obese HEENT: Dry mucus membranes, hearing grossly normal  Respiratory  system: CTAB with diminished bases, normal respiratory effort. Cardiovascular system: normal S1/S2, RRR, no pedal edema.   Gastrointestinal system: soft, NT, ND, +bowel sounds. Central nervous system: alert, oriented to self and place, no gross focal neurologic deficits, normal speech Extremities: moves all, no cyanosis, normal tone Skin: Dry, intact, normal temperature Psychiatry: normal mood, flat affect  Labs   Data Reviewed: I have personally reviewed following labs and imaging studies  CBC: Recent Labs  Lab 08/19/19 1858 08/20/19 0420 08/21/19 0548  WBC 17.9* 17.0* 16.0*  NEUTROABS 16.5*  --  14.3*  HGB 13.9 12.0 11.0*  HCT 42.1 35.8* 31.2*  MCV 94.2 92.3 91.5  PLT 415* 340 607   Basic Metabolic Panel: Recent Labs  Lab 08/19/19 1858 08/20/19 0420 08/21/19 0548  NA 137 137 140  K 4.8 4.4 4.3  CL 104 110 114*  CO2 19* 18* 18*  GLUCOSE 125* 176* 117*  BUN 74* 67* 48*  CREATININE 2.43* 1.85* 1.09*  CALCIUM 8.3*  7.8* 7.7*  MG  --   --  1.8   GFR: Estimated Creatinine Clearance: 47.4 mL/min (A) (by C-G formula based on SCr of 1.09 mg/dL (H)). Liver Function Tests: Recent Labs  Lab 08/19/19 1858 08/21/19 0548  AST 271* 103*  ALT 24 64*  ALKPHOS 137* 94  BILITOT 1.2 1.0  PROT 7.8 6.2*  ALBUMIN 2.6* 1.8*   No results for input(s): LIPASE, AMYLASE in the last 168 hours. No results for input(s): AMMONIA in the last 168 hours. Coagulation Profile: Recent Labs  Lab 08/20/19 0420  INR 1.3*   Cardiac Enzymes: Recent Labs  Lab 08/19/19 1858  CKTOTAL 22*   BNP (last 3 results) No results for input(s): PROBNP in the last 8760 hours. HbA1C: No results for input(s): HGBA1C in the last 72 hours. CBG: Recent Labs  Lab 08/20/19 1155 08/20/19 1617 08/20/19 2044 08/21/19 0100 08/21/19 0802  GLUCAP 121* 123* 120* 204* 110*   Lipid Profile: No results for input(s): CHOL, HDL, LDLCALC, TRIG, CHOLHDL, LDLDIRECT in the last 72 hours. Thyroid Function  Tests: No results for input(s): TSH, T4TOTAL, FREET4, T3FREE, THYROIDAB in the last 72 hours. Anemia Panel: No results for input(s): VITAMINB12, FOLATE, FERRITIN, TIBC, IRON, RETICCTPCT in the last 72 hours. Sepsis Labs: Recent Labs  Lab 08/19/19 1858 08/20/19 0420  PROCALCITON 4.62 3.30  LATICACIDVEN 2.5* 1.9    Recent Results (from the past 240 hour(s))  Blood Culture (routine x 2)     Status: None (Preliminary result)   Collection Time: 08/19/19  6:59 PM   Specimen: BLOOD  Result Value Ref Range Status   Specimen Description BLOOD RIGHT ANTECUBITAL  Final   Special Requests   Final    BOTTLES DRAWN AEROBIC AND ANAEROBIC Blood Culture adequate volume   Culture  Setup Time   Final    GRAM NEGATIVE RODS IN BOTH AEROBIC AND ANAEROBIC BOTTLES CRITICAL VALUE NOTED.  VALUE IS CONSISTENT WITH PREVIOUSLY REPORTED AND CALLED VALUE. Performed at [facility], [address] [place], [place] [address]    Culture GRAM NEGATIVE RODS  Final   Report Status PENDING  Incomplete  Blood Culture (routine x 2)     Status: None (Preliminary result)   Collection Time: 08/19/19  7:03 PM   Specimen: BLOOD  Result Value Ref Range Status   Specimen Description   Final    BLOOD LEFT ANTECUBITAL Performed at [facility], [address], [place], [place] [address]    Special Requests   Final    BOTTLES DRAWN AEROBIC AND ANAEROBIC Blood Culture adequate volume Performed at [facility], [address], [place], [place] [address]    Culture  Setup Time   Final    GRAM NEGATIVE RODS IN BOTH AEROBIC AND ANAEROBIC BOTTLES CRITICAL RESULT CALLED TO, READ BACK BY AND VERIFIED WITH: MYRA SLAUGHTER AT 8832 ON 08/20/2019 [place]. Performed at [facility], [address], [place], [place] [address]    Culture GRAM NEGATIVE RODS  Final   Report Status PENDING  Incomplete  Blood Culture ID Panel (Reflexed)     Status: Abnormal   Collection Time: 08/19/19  7:03 PM  Result  Value Ref Range Status   Enterococcus species NOT DETECTED NOT DETECTED Final   Listeria monocytogenes NOT DETECTED NOT DETECTED Final   Staphylococcus species NOT DETECTED NOT DETECTED Final   Staphylococcus aureus (BCID) NOT DETECTED NOT DETECTED Final   Streptococcus species NOT DETECTED NOT DETECTED Final   Streptococcus agalactiae NOT  DETECTED NOT DETECTED Final   Streptococcus pneumoniae NOT DETECTED NOT DETECTED Final   Streptococcus pyogenes NOT DETECTED NOT DETECTED Final   Acinetobacter baumannii NOT DETECTED NOT DETECTED Final   Enterobacteriaceae species DETECTED (A) NOT DETECTED Final    Comment: Enterobacteriaceae represent a large family of gram-negative bacteria, not a single organism. CRITICAL RESULT CALLED TO, READ BACK BY AND VERIFIED WITH: MYRA SLAUGHTER AT 7517 ON 08/20/2019 [place].    Enterobacter cloacae complex NOT DETECTED NOT DETECTED Final   Escherichia coli NOT DETECTED NOT DETECTED Final   Klebsiella oxytoca NOT DETECTED NOT DETECTED Final   Klebsiella pneumoniae NOT DETECTED NOT DETECTED Final   Proteus species DETECTED (A) NOT DETECTED Final    Comment: CRITICAL RESULT CALLED TO, READ BACK BY AND VERIFIED WITH: MYRA SLAUGHTER AT 0824 ON 08/20/2019 [place].    Serratia marcescens NOT DETECTED NOT DETECTED Final   Carbapenem resistance NOT DETECTED NOT DETECTED Final   Haemophilus influenzae NOT DETECTED NOT DETECTED Final   Neisseria meningitidis NOT DETECTED NOT DETECTED Final   Pseudomonas aeruginosa NOT DETECTED NOT DETECTED Final   Candida albicans NOT DETECTED NOT DETECTED Final   Candida glabrata NOT DETECTED NOT DETECTED Final   Candida krusei NOT DETECTED NOT DETECTED Final   Candida parapsilosis NOT DETECTED NOT DETECTED Final   Candida tropicalis NOT DETECTED NOT DETECTED Final    Comment: Performed at [facility], [address] [place], [place] [address]  Urine culture     Status: Abnormal (Preliminary result)   Collection Time: 08/19/19   9:15 PM   Specimen: In/Out Cath Urine  Result Value Ref Range Status   Specimen Description   Final    IN/OUT CATH URINE Performed at [facility], [address], [place], [place] [address]    Special Requests   Final    NONE Performed at [facility], [address] [place], [place] [address]    Culture >=100,000 COLONIES/mL GRAM NEGATIVE RODS (A)  Final   Report Status PENDING  Incomplete  SARS Coronavirus 2 by RT PCR (hospital order, performed in [facility] hospital lab) Nasopharyngeal Nasopharyngeal Swab     Status: None   Collection Time: 08/19/19  9:15 PM   Specimen: Nasopharyngeal Swab  Result Value Ref Range Status   SARS Coronavirus 2 NEGATIVE NEGATIVE Final    Comment: (NOTE) SARS-CoV-2 target nucleic acids are NOT DETECTED.  The SARS-CoV-2 RNA is generally detectable in upper and lower respiratory specimens during the acute phase of infection. The lowest concentration of SARS-CoV-2 viral copies this assay can detect is 250 copies / mL. A negative result does not preclude SARS-CoV-2 infection and should not be used as the sole basis for treatment or other patient management decisions.  A negative result may occur with improper specimen collection / handling, submission of specimen other than nasopharyngeal swab, presence of viral mutation(s) within the areas targeted by this assay, and inadequate number of viral copies (<250 copies / mL). A negative result must be combined with clinical observations, patient history, and epidemiological information.  Fact Sheet for Patients:   StrictlyIdeas.no  Fact Sheet for Healthcare Providers: BankingDealers.co.za  This test is not yet approved or  cleared by the [place] FDA and has been authorized for detection and/or diagnosis of SARS-CoV-2 by FDA under an Emergency Use Authorization (EUA).  This EUA will remain in effect (meaning this test can be  used) for the duration of the COVID-19 declaration under Section 564(b)(1) of the Act, 21  U.S.C. section 360bbb-3(b)(1), unless the authorization is terminated or revoked sooner.  Performed at [facility], [address] [place], [place] [address]   MRSA PCR Screening     Status: None   Collection Time: 08/20/19  5:55 AM   Specimen: Nasopharyngeal  Result Value Ref Range Status   MRSA by PCR NEGATIVE NEGATIVE Final    Comment:        The GeneXpert MRSA Assay (FDA approved for NASAL specimens only), is one component of a comprehensive MRSA colonization surveillance program. It is not intended to diagnose MRSA infection nor to guide or monitor treatment for MRSA infections. Performed at [facility], [address] [place], [place] [address]       Imaging Studies   CT ABDOMEN PELVIS WO CONTRAST  Result Date: 08/19/2019 CLINICAL DATA:  69 year old female with abdominal pain. Concern for bowel obstruction. EXAM: CT CHEST, ABDOMEN AND PELVIS WITHOUT CONTRAST TECHNIQUE: Multidetector CT imaging of the chest, abdomen and pelvis was performed following the standard protocol without IV contrast. COMPARISON:  Chest radiograph dated 08/19/2019. FINDINGS: Evaluation of this exam is limited in the absence of intravenous contrast. CT CHEST FINDINGS Cardiovascular: There is no cardiomegaly or pericardial effusion. Coronary vascular calcification of the LAD. Mild atherosclerotic calcification of the thoracic aorta. The central pulmonary arteries are grossly unremarkable on this noncontrast CT. Mediastinum/Nodes: There is no hilar or mediastinal adenopathy. The esophagus and the thyroid gland are grossly unremarkable as visualized. No mediastinal fluid collection. Lungs/Pleura: Mild pleural thickening versus minimal right pleural effusion. Right lung base linear atelectasis/scarring. No focal consolidation, or pneumothorax. The central airways are patent. Musculoskeletal:  Osteopenia with scoliosis and degenerative changes of the spine. Old healed left posterior rib fractures. No acute osseous pathology. CT ABDOMEN PELVIS FINDINGS No intra-abdominal free air or free fluid. Hepatobiliary: Several small hepatic hypodense lesions are not characterized on this noncontrast CT. Cholecystectomy. Pancreas: The pancreas is grossly unremarkable. Spleen: Normal in size without focal abnormality. Adrenals/Urinary Tract: The adrenal glands are unremarkable. Mild bilateral hydronephrosis, left greater right. Several nonobstructing left renal calculi measure up to 4 mm. There is left perinephric stranding concerning for pyelonephritis. Correlation with urinalysis recommended. The urinary bladder is distended. There is haziness of the bladder wall with perivesical stranding. Stomach/Bowel: There is no bowel obstruction or active inflammation. The appendix is normal. Vascular/Lymphatic: Moderate aortoiliac atherosclerotic disease. The IVC is grossly unremarkable. No portal venous gas. There is no adenopathy. Reproductive: Hysterectomy. Other: Mild diffuse subcutaneous edema. No fluid collection. Musculoskeletal: There is severe osteopenia, scoliosis, and degenerative changes of the spine and hips. There are compression fractures of the superior endplate of L4 and inferior endplate of L3, likely acute. Clinical correlation is recommended. There is approximately 25% loss of vertebral body height at L4. There is disc desiccation at L2-L3 and L3-L4. Bilateral L5 pars defects with grade 1 L5-S1 anterolisthesis. IMPRESSION: 1. Compression fractures of the superior endplate of L4 and inferior endplate of L3, likely acute. Clinical correlation is recommended. 2. Distended urinary bladder with mild bilateral hydronephrosis and findings concerning for pyelonephritis. Correlation with urinalysis recommended. 3. Several nonobstructing left renal calculi.  No obstructing stone. 4. No bowel obstruction. Normal  appendix. 5. Aortic Atherosclerosis (ICD10-I70.0). Electronically Signed   By: Anner Crete M.D.   On: 08/19/2019 20:15   CT Head Wo Contrast  Result Date: 08/19/2019 CLINICAL DATA:  Altered mental status EXAM: CT HEAD WITHOUT CONTRAST CT CERVICAL SPINE WITHOUT CONTRAST TECHNIQUE: Multidetector CT imaging of the head and  cervical spine was performed following the standard protocol without intravenous contrast. Multiplanar CT image reconstructions of the cervical spine were also generated. COMPARISON:  CT brain and cervical spine 08/05/2019 FINDINGS: CT HEAD FINDINGS Brain: No acute territorial infarction, hemorrhage or intracranial mass. Atrophy. Mild chronic small vessel ischemic change of the white matter. Stable ventricle size. Vascular: No hyperdense vessels. Scattered carotid vascular calcification. Skull: Normal. Negative for fracture or focal lesion. Sinuses/Orbits: No acute finding. Other: None CT CERVICAL SPINE FINDINGS Alignment: No subluxation.  Facet alignment is maintained. Skull base and vertebrae: No acute fracture. No primary bone lesion or focal pathologic process. Soft tissues and spinal canal: No prevertebral fluid or swelling. No visible canal hematoma. Disc levels: Mild degenerative changes at C5-C6, C6-C7 and C7-T1. Facet degenerative changes at multiple levels. Upper chest: Negative. Other: None IMPRESSION: 1. No CT evidence for acute intracranial abnormality. Atrophy and mild chronic small vessel ischemic change of the white matter. 2. No acute osseous abnormality of the cervical spine Electronically Signed   By: Donavan Foil M.D.   On: 08/19/2019 20:28   CT Chest Wo Contrast  Result Date: 08/19/2019 CLINICAL DATA:  69 year old female with abdominal pain. Concern for bowel obstruction. EXAM: CT CHEST, ABDOMEN AND PELVIS WITHOUT CONTRAST TECHNIQUE: Multidetector CT imaging of the chest, abdomen and pelvis was performed following the standard protocol without IV contrast.  COMPARISON:  Chest radiograph dated 08/19/2019. FINDINGS: Evaluation of this exam is limited in the absence of intravenous contrast. CT CHEST FINDINGS Cardiovascular: There is no cardiomegaly or pericardial effusion. Coronary vascular calcification of the LAD. Mild atherosclerotic calcification of the thoracic aorta. The central pulmonary arteries are grossly unremarkable on this noncontrast CT. Mediastinum/Nodes: There is no hilar or mediastinal adenopathy. The esophagus and the thyroid gland are grossly unremarkable as visualized. No mediastinal fluid collection. Lungs/Pleura: Mild pleural thickening versus minimal right pleural effusion. Right lung base linear atelectasis/scarring. No focal consolidation, or pneumothorax. The central airways are patent. Musculoskeletal: Osteopenia with scoliosis and degenerative changes of the spine. Old healed left posterior rib fractures. No acute osseous pathology. CT ABDOMEN PELVIS FINDINGS No intra-abdominal free air or free fluid. Hepatobiliary: Several small hepatic hypodense lesions are not characterized on this noncontrast CT. Cholecystectomy. Pancreas: The pancreas is grossly unremarkable. Spleen: Normal in size without focal abnormality. Adrenals/Urinary Tract: The adrenal glands are unremarkable. Mild bilateral hydronephrosis, left greater right. Several nonobstructing left renal calculi measure up to 4 mm. There is left perinephric stranding concerning for pyelonephritis. Correlation with urinalysis recommended. The urinary bladder is distended. There is haziness of the bladder wall with perivesical stranding. Stomach/Bowel: There is no bowel obstruction or active inflammation. The appendix is normal. Vascular/Lymphatic: Moderate aortoiliac atherosclerotic disease. The IVC is grossly unremarkable. No portal venous gas. There is no adenopathy. Reproductive: Hysterectomy. Other: Mild diffuse subcutaneous edema. No fluid collection. Musculoskeletal: There is severe  osteopenia, scoliosis, and degenerative changes of the spine and hips. There are compression fractures of the superior endplate of L4 and inferior endplate of L3, likely acute. Clinical correlation is recommended. There is approximately 25% loss of vertebral body height at L4. There is disc desiccation at L2-L3 and L3-L4. Bilateral L5 pars defects with grade 1 L5-S1 anterolisthesis. IMPRESSION: 1. Compression fractures of the superior endplate of L4 and inferior endplate of L3, likely acute. Clinical correlation is recommended. 2. Distended urinary bladder with mild bilateral hydronephrosis and findings concerning for pyelonephritis. Correlation with urinalysis recommended. 3. Several nonobstructing left renal calculi.  No obstructing stone. 4. No bowel  obstruction. Normal appendix. 5. Aortic Atherosclerosis (ICD10-I70.0). Electronically Signed   By: Anner Crete M.D.   On: 08/19/2019 20:15   CT Cervical Spine Wo Contrast  Result Date: 08/19/2019 CLINICAL DATA:  Altered mental status EXAM: CT HEAD WITHOUT CONTRAST CT CERVICAL SPINE WITHOUT CONTRAST TECHNIQUE: Multidetector CT imaging of the head and cervical spine was performed following the standard protocol without intravenous contrast. Multiplanar CT image reconstructions of the cervical spine were also generated. COMPARISON:  CT brain and cervical spine 08/05/2019 FINDINGS: CT HEAD FINDINGS Brain: No acute territorial infarction, hemorrhage or intracranial mass. Atrophy. Mild chronic small vessel ischemic change of the white matter. Stable ventricle size. Vascular: No hyperdense vessels. Scattered carotid vascular calcification. Skull: Normal. Negative for fracture or focal lesion. Sinuses/Orbits: No acute finding. Other: None CT CERVICAL SPINE FINDINGS Alignment: No subluxation.  Facet alignment is maintained. Skull base and vertebrae: No acute fracture. No primary bone lesion or focal pathologic process. Soft tissues and spinal canal: No prevertebral  fluid or swelling. No visible canal hematoma. Disc levels: Mild degenerative changes at C5-C6, C6-C7 and C7-T1. Facet degenerative changes at multiple levels. Upper chest: Negative. Other: None IMPRESSION: 1. No CT evidence for acute intracranial abnormality. Atrophy and mild chronic small vessel ischemic change of the white matter. 2. No acute osseous abnormality of the cervical spine Electronically Signed   By: Donavan Foil M.D.   On: 08/19/2019 20:28   DG Chest Port 1 View  Result Date: 08/19/2019 CLINICAL DATA:  Change in mental status EXAM: PORTABLE CHEST 1 VIEW COMPARISON:  Jun 10, 2017 FINDINGS: The heart size and mediastinal contours are within normal limits. Aortic knob calcifications are seen. There appears to be a 2 cm rounded nodule seen within the right perihilar region. No large airspace consolidation seen within the left lung. No pleural effusion. No acute osseous abnormality. IMPRESSION: 2 cm rounded nodule within the right perihilar region not clearly identified on the prior exam. Would recommend dedicated nonemergent chest CT for further evaluation. Electronically Signed   By: Prudencio Pair M.D.   On: 08/19/2019 19:21     Medications   Scheduled Meds: . Chlorhexidine Gluconate Cloth  6 each Topical Q0600  . enoxaparin (LOVENOX) injection  30 mg Subcutaneous Q24H  . feeding supplement (ENSURE ENLIVE)  237 mL Oral TID BM  . insulin aspart  0-15 Units Subcutaneous TID WC  . insulin aspart  0-5 Units Subcutaneous QHS  . multivitamin with minerals  1 tablet Oral Daily   Continuous Infusions: . sodium chloride 75 mL/hr at 08/21/19 0227  . cefTRIAXone (ROCEPHIN)  IV Stopped (08/20/19 1133)  . sodium chloride         LOS: 2 days    Time spent: 30 minutes    Ezekiel Slocumb, DO [facility]  08/21/2019, 9:01 AM    If 7PM-7AM, please contact night-coverage. How to contact the [facility] Attending or Consulting provider [place] or covering provider during after hours [place], for this patient?    1. Check the care team in [facility] and look for a) attending/consulting TRH provider listed and b) the [facility] team listed 2. Log into [email or web address] and use [place]'s universal password to access. If you do not have the password, please contact the hospital operator. 3. Locate the [facility] provider you are looking for under [facility] and page to a number that you can be directly reached. 4. If you still have difficulty reaching the provider, please page the [facility] (Director on  Call) for the Hospitalists listed on amion for assistance.

## 2019-08-21 NOTE — Telephone Encounter (Signed)
Theresa Mills will handle this

## 2019-08-22 ENCOUNTER — Inpatient Hospital Stay: Payer: Medicare Other

## 2019-08-22 DIAGNOSIS — N1330 Unspecified hydronephrosis: Secondary | ICD-10-CM

## 2019-08-22 DIAGNOSIS — N179 Acute kidney failure, unspecified: Secondary | ICD-10-CM

## 2019-08-22 LAB — CBC WITH DIFFERENTIAL/PLATELET
Abs Immature Granulocytes: 0.24 10*3/uL — ABNORMAL HIGH (ref 0.00–0.07)
Basophils Absolute: 0 10*3/uL (ref 0.0–0.1)
Basophils Relative: 0 %
Eosinophils Absolute: 0.1 10*3/uL (ref 0.0–0.5)
Eosinophils Relative: 1 %
HCT: 30.7 % — ABNORMAL LOW (ref 36.0–46.0)
Hemoglobin: 10.5 g/dL — ABNORMAL LOW (ref 12.0–15.0)
Immature Granulocytes: 1 %
Lymphocytes Relative: 9 %
Lymphs Abs: 1.5 10*3/uL (ref 0.7–4.0)
MCH: 31.3 pg (ref 26.0–34.0)
MCHC: 34.2 g/dL (ref 30.0–36.0)
MCV: 91.4 fL (ref 80.0–100.0)
Monocytes Absolute: 0.7 10*3/uL (ref 0.1–1.0)
Monocytes Relative: 4 %
Neutro Abs: 14.8 10*3/uL — ABNORMAL HIGH (ref 1.7–7.7)
Neutrophils Relative %: 85 %
Platelets: 251 10*3/uL (ref 150–400)
RBC: 3.36 MIL/uL — ABNORMAL LOW (ref 3.87–5.11)
RDW: 13.6 % (ref 11.5–15.5)
WBC: 17.4 10*3/uL — ABNORMAL HIGH (ref 4.0–10.5)
nRBC: 0 % (ref 0.0–0.2)

## 2019-08-22 LAB — BASIC METABOLIC PANEL
Anion gap: 7 (ref 5–15)
BUN: 33 mg/dL — ABNORMAL HIGH (ref 8–23)
CO2: 19 mmol/L — ABNORMAL LOW (ref 22–32)
Calcium: 8 mg/dL — ABNORMAL LOW (ref 8.9–10.3)
Chloride: 116 mmol/L — ABNORMAL HIGH (ref 98–111)
Creatinine, Ser: 0.84 mg/dL (ref 0.44–1.00)
GFR calc Af Amer: >60 mL/min (ref >60)
GFR calc non Af Amer: >60 mL/min (ref >60)
Glucose, Bld: 108 mg/dL — ABNORMAL HIGH (ref 70–99)
Potassium: 4.2 mmol/L (ref 3.5–5.1)
Sodium: 142 mmol/L (ref 135–145)

## 2019-08-22 LAB — CULTURE, BLOOD (ROUTINE X 2)
Special Requests: ADEQUATE
Special Requests: ADEQUATE

## 2019-08-22 LAB — URINE CULTURE: Culture: >=100000 — AB

## 2019-08-22 LAB — GLUCOSE, CAPILLARY
Glucose-Capillary: 102 mg/dL — ABNORMAL HIGH (ref 70–99)
Glucose-Capillary: 132 mg/dL — ABNORMAL HIGH (ref 70–99)
Glucose-Capillary: 130 mg/dL — ABNORMAL HIGH (ref 70–99)
Glucose-Capillary: 133 mg/dL — ABNORMAL HIGH (ref 70–99)

## 2019-08-22 MED ORDER — SODIUM CHLORIDE 0.9 % IV SOLN
2.0000 g | Freq: Four times a day (QID) | INTRAVENOUS | Status: DC
  Administered 2019-08-23 – 2019-08-24 (×5): 2 g via INTRAVENOUS
  Filled 2019-08-22: qty 2
  Filled 2019-08-22 (×3): qty 2000
  Filled 2019-08-22: qty 2
  Filled 2019-08-22 (×4): qty 2000
  Filled 2019-08-22: qty 2

## 2019-08-22 NOTE — Progress Notes (Signed)
PROGRESS NOTE    Theresa Mills   ASN:[id number]  DOB: 06-Jan-1951  PCP: Ronnell Freshwater, NP    DOA: 08/19/2019 LOS: 3   Brief Narrative   Theresa Mills is a 69 y.o. female with medical history of DM, GERD, anxiety, OSA, Parkinson's with dementia, hypothyroidism, frequent falls, brought in 08/19/19 by EMS after SNF staff called due to shortness of breath and altered mental status.  Patient complained of abdominal pain and neck pain but could not provide additional history due to dementia and altered mental status.  In the ED, patient was lethargic, afebrile, hypotensive with BP 83/41, HR 90, RR 24, O2 sat 93% on room air.  Labs were notable for leukocytosis 18k, lactic acidosis 2.5.  UA showed pyuria.  Creatinine was 2.43 (baseline 0.69), procalcitonin 4.62, troponin 28.  CT abdomen pelvis showed distended urinary bladder with bilateral hydronephrosis and findings concerning for pyelonephritis, several nonobstructing left renal calculi.  No focal consolidation was seen on CT chest.  EKG was normal sinus rhythm.  Patient's blood pressure improved with IV fluid bolus.  Started on IV antibiotics and admitted to hospitalist service for further evaluation and management.     Assessment & Plan   Principal Problem:   Severe sepsis ([place]) Active Problems:   Dementia due to Parkinson's disease without behavioral disturbance ([place])   Diabetes mellitus type 2, uncomplicated (HCC)   Essential hypertension   Pressure injury of coccygeal region, stage 2 (HCC)   Acquired hypothyroidism   Bilateral hydronephrosis   Acute pyelonephritis   Acute metabolic encephalopathy   Acute urinary retention   AKI (acute kidney injury) ([place])   Malnutrition of moderate degree   Severe sepsis secondary to acute pyelonephritis and bacteremia - present on admission with leukocytosis, tachycardia, hypotension that was fluid responsive, lactic acidosis and AKI.  Imaging findings and UA are consistent with  pyelonephritis.   Sepsis physiology has improved and lactic acidosis resolved.  Blood cultures growing Proteus and Enterobacteriaceae spp.  Transition to ampicillin today, amoxicillin on discharge.  Day 3 of antibiotics.    Stop IV fluids now taking PO well.    Bilateral hydronephrosis secondary to acute urinary retention -present on admission, CT abdomen showed distended bladder and bilateral hydronephrosis.  Foley catheter removed this AM for voiding trial.  Bladder scans.  Flomax started 7/29.  Will get urology consult if not voiding.  Acute kidney injury - Resolved with Foley and IV fluids.  Present on admission with creatinine 2.43, prior baseline 0.69.  Likely multifactorial with sepsis and hypotension causing prerenal azotemia and post renal secondary to hydronephrosis and acute urinary retention.  Daily BMP to monitor.  Avoid nephrotoxins and hypotension.    Acute metabolic encephalopathy - Present on admission likely secondary to infection on top of baseline dementia.  Resolved with treatment of infection.    Transaminitis -present on admission, improving.  AST 271>>103, ALT 24>>64, alk phos 137>>94, normal total bili 1.2>>1.0.  RUQ ultrasound pending.  Suspect this is due to severe sepsis and shock liver.  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  Continue Sinemet, ropinirole  Essential hypertension -not on antihypertensives outpatient  Acquired hypothyroidism -continue levothyroxine  Hyperlipidemia -continue simvastatin  Depression/anxiety -continue Zoloft, mirtazapine, Celexa, and Xanax  Insomnia -continue melatonin   Patient BMI: Body mass index is 27.97 kg/m.   DVT prophylaxis: enoxaparin (LOVENOX) injection 40 mg Start: 08/21/19 2200   Diet:  Diet Orders (  From admission, onward)    Start     Ordered   08/22/19 0155  Diet NPO time specified  Diet effective now        08/22/19 0154             Code Status: Full Code    Subjective 08/22/19    Patient seen at bedside this morning.  No acute events reported overnight.  She was sitting up and eating breakfast this morning.  Says "it has been a rough day", but is unable to provide any specifics.  Mildly confused but appears at baseline.  She denies fever/chills, chest pain or shortness of breath, nausea vomiting or diarrhea.  Disposition Plan & Communication   Status is: Inpatient  Remains inpatient appropriate because:IV treatments appropriate due to intensity of illness or inability to take PO.  Voiding trial today, expect d/c tomorrow if able to void.   Dispo: The patient is from: SNF              Anticipated d/c is to: SNF              Anticipated d/c date is: 1-2 days              Patient currently is not medically stable to d/c.    Family Communication: None at bedside during encounter, will attempt to call   Consults, Procedures, Significant Events   Consultants:   None  Procedures:   Foley placed in the ER 7/27  Antimicrobials:   Rocephin 7/27 >> 7/30  Ampicillin 7/30 >>      Objective   Vitals:   08/21/19 1130 08/21/19 1606 08/22/19 0158 08/22/19 0448  BP:  128/69 (!) 104/53 (!) 109/52  Pulse:   77 81  Resp:   19 18  Temp: 97.8 F (36.6 C) 98.4 F (36.9 C) 98.9 F (37.2 C) 97.8 F (36.6 C)  TempSrc: Oral Oral Oral   SpO2:   97% 94%  Weight:    73.9 kg  Height:        Intake/Output Summary (Last 24 hours) at 08/22/2019 0833 Last data filed at 08/22/2019 7253 Gross per 24 hour  Intake 1146.15 ml  Output 2900 ml  Net -1753.85 ml   Filed Weights   08/19/19 1908 08/20/19 0044 08/22/19 0448  Weight: 81.6 kg 74.3 kg 73.9 kg    Physical Exam:  General exam: awake, alert, no acute distress, obese Respiratory system: CTAB, no wheezes or rhonchi, normal respiratory effort. Cardiovascular system: normal S1/S2, RRR, no pedal edema.   Gastrointestinal system: soft, NT, ND,  +bowel sounds. Central nervous system: A&Ox3, no gross focal neurologic deficits, normal speech Extremities: moves all, no cyanosis, normal tone Skin: Dry, intact, normal temperature Psychiatry: normal mood, flat affect  Labs   Data Reviewed: I have personally reviewed following labs and imaging studies  CBC: Recent Labs  Lab 08/19/19 1858 08/20/19 0420 08/21/19 0548 08/22/19 0608  WBC 17.9* 17.0* 16.0* 17.4*  NEUTROABS 16.5*  --  14.3* 14.8*  HGB 13.9 12.0 11.0* 10.5*  HCT 42.1 35.8* 31.2* 30.7*  MCV 94.2 92.3 91.5 91.4  PLT 415* 340 242 664   Basic Metabolic Panel: Recent Labs  Lab 08/19/19 1858 08/20/19 0420 08/21/19 0548 08/22/19 0608  NA 137 137 140 142  K 4.8 4.4 4.3 4.2  CL 104 110 114* 116*  CO2 19* 18* 18* 19*  GLUCOSE 125* 176* 117* 108*  BUN 74* 67* 48* 33*  CREATININE 2.43* 1.85* 1.09* 0.84  CALCIUM 8.3* 7.8* 7.7* 8.0*  MG  --   --  1.8  --    GFR: Estimated Creatinine Clearance: 61.4 mL/min (by C-G formula based on SCr of 0.84 mg/dL). Liver Function Tests: Recent Labs  Lab 08/19/19 1858 08/21/19 0548  AST 271* 103*  ALT 24 64*  ALKPHOS 137* 94  BILITOT 1.2 1.0  PROT 7.8 6.2*  ALBUMIN 2.6* 1.8*   No results for input(s): LIPASE, AMYLASE in the last 168 hours. No results for input(s): AMMONIA in the last 168 hours. Coagulation Profile: Recent Labs  Lab 08/20/19 0420  INR 1.3*   Cardiac Enzymes: Recent Labs  Lab 08/19/19 1858  CKTOTAL 22*   BNP (last 3 results) No results for input(s): PROBNP in the last 8760 hours. HbA1C: No results for input(s): HGBA1C in the last 72 hours. CBG: Recent Labs  Lab 08/21/19 0100 08/21/19 0802 08/21/19 1129 08/21/19 1605 08/21/19 2301  GLUCAP 204* 110* 110* 110* 176*   Lipid Profile: No results for input(s): CHOL, HDL, LDLCALC, TRIG, CHOLHDL, LDLDIRECT in the last 72 hours. Thyroid Function Tests: No results for input(s): TSH, T4TOTAL, FREET4, T3FREE, THYROIDAB in the last 72 hours. Anemia  Panel: No results for input(s): VITAMINB12, FOLATE, FERRITIN, TIBC, IRON, RETICCTPCT in the last 72 hours. Sepsis Labs: Recent Labs  Lab 08/19/19 1858 08/20/19 0420  PROCALCITON 4.62 3.30  LATICACIDVEN 2.5* 1.9    Recent Results (from the past 240 hour(s))  Blood Culture (routine x 2)     Status: Abnormal (Preliminary result)   Collection Time: 08/19/19  6:59 PM   Specimen: BLOOD  Result Value Ref Range Status   Specimen Description   Final    BLOOD RIGHT ANTECUBITAL Performed at [facility], [address], [place], [place] [address]    Special Requests   Final    BOTTLES DRAWN AEROBIC AND ANAEROBIC Blood Culture adequate volume Performed at [facility], [address], [place], [place] [address]    Culture  Setup Time   Final    GRAM NEGATIVE RODS IN BOTH AEROBIC AND ANAEROBIC BOTTLES CRITICAL VALUE NOTED.  VALUE IS CONSISTENT WITH PREVIOUSLY REPORTED AND CALLED VALUE. Performed at [facility], [address] [place], [place] [address]    Culture PROTEUS MIRABILIS (A)  Final   Report Status PENDING  Incomplete  Blood Culture (routine x 2)     Status: Abnormal (Preliminary result)   Collection Time: 08/19/19  7:03 PM   Specimen: BLOOD  Result Value Ref Range Status   Specimen Description   Final    BLOOD LEFT ANTECUBITAL Performed at [facility], [address] [place], [place] [address]    Special Requests   Final    BOTTLES DRAWN AEROBIC AND ANAEROBIC Blood Culture adequate volume Performed at [facility], [address], [place], [place] [address]    Culture  Setup Time   Final    GRAM NEGATIVE RODS IN BOTH AEROBIC AND ANAEROBIC BOTTLES CRITICAL RESULT CALLED TO, READ BACK BY AND VERIFIED WITH: MYRA SLAUGHTER AT 7902 ON 08/20/2019 [place].    Culture (A)  Final    PROTEUS MIRABILIS SUSCEPTIBILITIES TO FOLLOW Performed at [facility], [place] [address] [place], [place] [address]    Report Status PENDING   Incomplete  Blood Culture ID Panel (Reflexed)     Status: Abnormal   Collection Time: 08/19/19  7:03 PM  Result Value Ref Range Status   Enterococcus species NOT DETECTED NOT DETECTED Final  Listeria monocytogenes NOT DETECTED NOT DETECTED Final   Staphylococcus species NOT DETECTED NOT DETECTED Final   Staphylococcus aureus (BCID) NOT DETECTED NOT DETECTED Final   Streptococcus species NOT DETECTED NOT DETECTED Final   Streptococcus agalactiae NOT DETECTED NOT DETECTED Final   Streptococcus pneumoniae NOT DETECTED NOT DETECTED Final   Streptococcus pyogenes NOT DETECTED NOT DETECTED Final   Acinetobacter baumannii NOT DETECTED NOT DETECTED Final   Enterobacteriaceae species DETECTED (A) NOT DETECTED Final    Comment: Enterobacteriaceae represent a large family of gram-negative bacteria, not a single organism. CRITICAL RESULT CALLED TO, READ BACK BY AND VERIFIED WITH: MYRA SLAUGHTER AT 5027 ON 08/20/2019 [place].    Enterobacter cloacae complex NOT DETECTED NOT DETECTED Final   Escherichia coli NOT DETECTED NOT DETECTED Final   Klebsiella oxytoca NOT DETECTED NOT DETECTED Final   Klebsiella pneumoniae NOT DETECTED NOT DETECTED Final   Proteus species DETECTED (A) NOT DETECTED Final    Comment: CRITICAL RESULT CALLED TO, READ BACK BY AND VERIFIED WITH: MYRA SLAUGHTER AT 0824 ON 08/20/2019 [place].    Serratia marcescens NOT DETECTED NOT DETECTED Final   Carbapenem resistance NOT DETECTED NOT DETECTED Final   Haemophilus influenzae NOT DETECTED NOT DETECTED Final   Neisseria meningitidis NOT DETECTED NOT DETECTED Final   Pseudomonas aeruginosa NOT DETECTED NOT DETECTED Final   Candida albicans NOT DETECTED NOT DETECTED Final   Candida glabrata NOT DETECTED NOT DETECTED Final   Candida krusei NOT DETECTED NOT DETECTED Final   Candida parapsilosis NOT DETECTED NOT DETECTED Final   Candida tropicalis NOT DETECTED NOT DETECTED Final    Comment: Performed at [facility], [address], [place], [place] [address]  Urine culture     Status: Abnormal   Collection Time: 08/19/19  9:15 PM   Specimen: In/Out Cath Urine  Result Value Ref Range Status   Specimen Description   Final    IN/OUT CATH URINE Performed at [facility], [address], [place], [place] [address]    Special Requests   Final    NONE Performed at [facility], [place], [place] [address]    Culture >=100,000 COLONIES/mL PROTEUS MIRABILIS (A)  Final   Report Status 08/22/2019 FINAL  Final   Organism ID, Bacteria PROTEUS MIRABILIS (A)  Final      Susceptibility   Proteus mirabilis - MIC*    AMPICILLIN <=2 SENSITIVE Sensitive     CEFAZOLIN <=4 SENSITIVE Sensitive     CEFTRIAXONE <=0.25 SENSITIVE Sensitive     CIPROFLOXACIN <=0.25 SENSITIVE Sensitive     GENTAMICIN <=1 SENSITIVE Sensitive     IMIPENEM 2 SENSITIVE Sensitive     NITROFURANTOIN 128 RESISTANT Resistant     TRIMETH/SULFA <=20 SENSITIVE Sensitive     AMPICILLIN/SULBACTAM <=2 SENSITIVE Sensitive     PIP/TAZO <=4 SENSITIVE Sensitive     * >=100,000 COLONIES/mL PROTEUS MIRABILIS  SARS Coronavirus 2 by RT PCR (hospital order, performed in [place] hospital lab) Nasopharyngeal Nasopharyngeal Swab     Status: None   Collection Time: 08/19/19  9:15 PM   Specimen: Nasopharyngeal Swab  Result Value Ref Range Status   SARS Coronavirus 2 NEGATIVE NEGATIVE Final    Comment: (NOTE) SARS-CoV-2 target nucleic acids are NOT DETECTED.  The SARS-CoV-2 RNA is generally detectable in upper and lower respiratory specimens during the acute phase of infection. The lowest concentration of SARS-CoV-2 viral copies this assay can detect is 250 copies / mL. A negative result does not preclude  SARS-CoV-2 infection and should not be used as the sole basis for treatment or other patient management decisions.  A negative result may occur with improper specimen collection / handling, submission of specimen other than  nasopharyngeal swab, presence of viral mutation(s) within the areas targeted by this assay, and inadequate number of viral copies (<250 copies / mL). A negative result must be combined with clinical observations, patient history, and epidemiological information.  Fact Sheet for Patients:   StrictlyIdeas.no  Fact Sheet for Healthcare Providers: BankingDealers.co.za  This test is not yet approved or  cleared by the [place] FDA and has been authorized for detection and/or diagnosis of SARS-CoV-2 by FDA under an Emergency Use Authorization (EUA).  This EUA will remain in effect (meaning this test can be used) for the duration of the COVID-19 declaration under Section 564(b)(1) of the Act, 21 U.S.C. section 360bbb-3(b)(1), unless the authorization is terminated or revoked sooner.  Performed at [facility], [address] [place], [place] [address]   MRSA PCR Screening     Status: None   Collection Time: 08/20/19  5:55 AM   Specimen: Nasopharyngeal  Result Value Ref Range Status   MRSA by PCR NEGATIVE NEGATIVE Final    Comment:        The GeneXpert MRSA Assay (FDA approved for NASAL specimens only), is one component of a comprehensive MRSA colonization surveillance program. It is not intended to diagnose MRSA infection nor to guide or monitor treatment for MRSA infections. Performed at [facility], [address], [place], [place] [address]       Imaging Studies   US Abdomen Limited RUQ  Result Date: 08/22/2019 CLINICAL DATA:  Elevated liver enzymes EXAM: ULTRASOUND ABDOMEN LIMITED RIGHT UPPER QUADRANT COMPARISON:  CT abdomen and pelvis August 19, 2019 FINDINGS: Surgically absent. Common bile duct: Diameter: 6 mm. No intrahepatic or extrahepatic biliary duct dilatation. Liver: No focal lesion identified. Liver echogenicity is somewhat inhomogeneous without focal liver lesion demonstrable. Portal vein is  patent on color Doppler imaging with normal direction of blood flow towards the liver. Other: None. IMPRESSION: 1. Liver echogenicity is somewhat inhomogeneous which may indicate fatty infiltration. No focal liver lesions are demonstrable by ultrasound. 2.  Gallbladder absent. Electronically Signed   By: Lowella Grip III M.D.   On: 08/22/2019 08:15     Medications   Scheduled Meds:  ALPRAZolam  0.25 mg Oral q morning - 10a   calcium-vitamin D  1 tablet Oral Daily   carbidopa-levodopa  1 tablet Oral BID   Chlorhexidine Gluconate Cloth  6 each Topical Q0600   citalopram  40 mg Oral Daily   docusate sodium  100 mg Oral BID   enoxaparin (LOVENOX) injection  40 mg Subcutaneous Q24H   feeding supplement (ENSURE ENLIVE)  237 mL Oral TID BM   insulin aspart  0-15 Units Subcutaneous TID WC   insulin aspart  0-5 Units Subcutaneous QHS   levothyroxine  175 mcg Oral Q0600   mirtazapine  7.5 mg Oral QHS   multivitamin with minerals  1 tablet Oral Daily   multivitamin with minerals  1 tablet Oral Daily   pantoprazole  40 mg Oral Daily   rOPINIRole  0.5 mg Oral BID   rOPINIRole  1 mg Oral QHS   sertraline  50 mg Oral Daily   simvastatin  40 mg Oral QHS   tamsulosin  0.4 mg Oral QPC supper   Continuous Infusions:  cefTRIAXone (ROCEPHIN)  IV 2 g (08/21/19  1025)   lactated ringers 75 mL/hr at 08/21/19 1232       LOS: 3 days    Time spent: 25 minutes    Ezekiel Slocumb, DO [facility]  08/22/2019, 8:33 AM    If 7PM-7AM, please contact night-coverage. How to contact the [facility] Attending or Consulting provider [place] or covering provider during after hours [place], for this patient?    1. Check the care team in [facility] and look for a) attending/consulting TRH provider listed and b) the [facility] team listed 2. Log into [email or web address] and use [place] universal password to access. If you do not have the password, please contact the hospital operator. 3. Locate  the [facility] provider you are looking for under [facility] and page to a number that you can be directly reached. 4. If you still have difficulty reaching the provider, please page the [facility] (Director on Call) for the Hospitalists listed on amion for assistance.

## 2019-08-22 NOTE — Care Management Important Message (Signed)
Important Message  Patient Details  Name: Theresa Mills MRN: [id number] Date of Birth: 1950/03/05   Medicare Important Message Given:  Yes     Dannette Barbara 08/22/2019, 4:18 PM

## 2019-08-22 NOTE — Plan of Care (Signed)

## 2019-08-23 LAB — BASIC METABOLIC PANEL
Anion gap: 10 (ref 5–15)
BUN: 40 mg/dL — ABNORMAL HIGH (ref 8–23)
CO2: 21 mmol/L — ABNORMAL LOW (ref 22–32)
Calcium: 8.1 mg/dL — ABNORMAL LOW (ref 8.9–10.3)
Chloride: 108 mmol/L (ref 98–111)
Creatinine, Ser: 1.21 mg/dL — ABNORMAL HIGH (ref 0.44–1.00)
GFR calc Af Amer: 52 mL/min — ABNORMAL LOW (ref >60)
GFR calc non Af Amer: 45 mL/min — ABNORMAL LOW (ref >60)
Glucose, Bld: 110 mg/dL — ABNORMAL HIGH (ref 70–99)
Potassium: 4.3 mmol/L (ref 3.5–5.1)
Sodium: 139 mmol/L (ref 135–145)

## 2019-08-23 LAB — CBC WITH DIFFERENTIAL/PLATELET
Abs Immature Granulocytes: 0.29 10*3/uL — ABNORMAL HIGH (ref 0.00–0.07)
Basophils Absolute: 0 10*3/uL (ref 0.0–0.1)
Basophils Relative: 0 %
Eosinophils Absolute: 0.1 10*3/uL (ref 0.0–0.5)
Eosinophils Relative: 0 %
HCT: 33.8 % — ABNORMAL LOW (ref 36.0–46.0)
Hemoglobin: 11.1 g/dL — ABNORMAL LOW (ref 12.0–15.0)
Immature Granulocytes: 2 %
Lymphocytes Relative: 8 %
Lymphs Abs: 1.5 10*3/uL (ref 0.7–4.0)
MCH: 31.1 pg (ref 26.0–34.0)
MCHC: 32.8 g/dL (ref 30.0–36.0)
MCV: 94.7 fL (ref 80.0–100.0)
Monocytes Absolute: 0.6 10*3/uL (ref 0.1–1.0)
Monocytes Relative: 3 %
Neutro Abs: 16.6 10*3/uL — ABNORMAL HIGH (ref 1.7–7.7)
Neutrophils Relative %: 87 %
Platelets: 264 10*3/uL (ref 150–400)
RBC: 3.57 MIL/uL — ABNORMAL LOW (ref 3.87–5.11)
RDW: 13.6 % (ref 11.5–15.5)
WBC: 19.1 10*3/uL — ABNORMAL HIGH (ref 4.0–10.5)
nRBC: 0 % (ref 0.0–0.2)

## 2019-08-23 LAB — GLUCOSE, CAPILLARY
Glucose-Capillary: 109 mg/dL — ABNORMAL HIGH (ref 70–99)
Glucose-Capillary: 189 mg/dL — ABNORMAL HIGH (ref 70–99)
Glucose-Capillary: 90 mg/dL (ref 70–99)
Glucose-Capillary: 118 mg/dL — ABNORMAL HIGH (ref 70–99)

## 2019-08-23 MED ORDER — LACTATED RINGERS IV SOLN: INTRAVENOUS | Status: AC

## 2019-08-23 NOTE — Progress Notes (Addendum)
PROGRESS NOTE    CHRYSTINE FROGGE   ZOX:[id number]  DOB: [birth year]  PCP: Ronnell Freshwater, NP    DOA: 08/19/2019 LOS: 4   Brief Narrative   Theresa Mills is a 69 y.o. female with medical history of DM, GERD, anxiety, OSA, Parkinson's with dementia, hypothyroidism, frequent falls, brought in 08/19/19 by EMS after SNF staff called due to shortness of breath and altered mental status.  Patient complained of abdominal pain and neck pain but could not provide additional history due to dementia and altered mental status.  In the ED, patient was lethargic, afebrile, hypotensive with BP 83/41, HR 90, RR 24, O2 sat 93% on room air.  Labs were notable for leukocytosis 18k, lactic acidosis 2.5.  UA showed pyuria.  Creatinine was 2.43 (baseline 0.69), procalcitonin 4.62, troponin 28.  CT abdomen pelvis showed distended urinary bladder with bilateral hydronephrosis and findings concerning for pyelonephritis, several nonobstructing left renal calculi.  No focal consolidation was seen on CT chest.  EKG was normal sinus rhythm.  Patient's blood pressure improved with IV fluid bolus.  Started on IV antibiotics and admitted to hospitalist service for further evaluation and management.     Assessment & Plan   Principal Problem:   Severe sepsis ([place]) Active Problems:   Dementia due to Parkinson's disease without behavioral disturbance ([place])   Diabetes mellitus type 2, uncomplicated (HCC)   Essential hypertension   Pressure injury of coccygeal region, stage 2 (HCC)   Acquired hypothyroidism   Bilateral hydronephrosis   Acute pyelonephritis   Acute metabolic encephalopathy   Acute urinary retention   AKI (acute kidney injury) ([place])   Malnutrition of moderate degree   Severe sepsis secondary to acute pyelonephritis and bacteremia - present on admission with leukocytosis, tachycardia, hypotension that was fluid responsive, lactic acidosis and AKI.  Imaging findings and UA are consistent with  pyelonephritis.   Sepsis physiology has improved and lactic acidosis resolved.  Blood cultures grew Proteus and Enterobacteriaceae spp.  Transition to ampicillin today, amoxicillin on discharge.  Day 4 of antibiotics.    Off IV fluids now taking PO well.    Bilateral hydronephrosis secondary to acute urinary retention -present on admission, CT abdomen showed distended bladder and bilateral hydronephrosis.  Foley catheter removed this AM for voiding trial.  Bladder scans.  Flomax started 7/29.  Will get urology consult if not voiding.  Reportedly voiding per nursing, no reports of PVR.  But Cr increased some today.  May need to replace foley.    Acute kidney injury - Resolved with Foley and IV fluids.  Present on admission with creatinine 2.43, prior baseline 0.69.  Likely multifactorial with sepsis and hypotension causing prerenal azotemia and post renal secondary to hydronephrosis and acute urinary retention.   Improved with Foley and IVF's.  Foley d/c'd yesterday.  Reportedly she is voiding but Cr up again today 0.84>>1.21. Resume gentle IV fluids.  Replace Foley tomorrow if not improved.  Monitor urine output closely. Daily BMP to monitor.  Avoid nephrotoxins and hypotension.    Acute metabolic encephalopathy - Present on admission likely secondary to infection on top of baseline dementia.  Resolved with treatment of infection.    Transaminitis -present on admission, improving.  AST 271>>103, ALT 24>>64, alk phos 137>>94, normal total bili 1.2>>1.0.  RUQ showed liver echogenicity indicative of fatty infiltration, no focal liver lesions, gallbladder absent..  Suspect due to severe sepsis and shock liver.  Type 2 diabetes -sliding scale NovoLog  Dementia due to  Parkinson's disease without behavioral disturbance -no acute issues.  Continue Sinemet, ropinirole  Essential hypertension -not on antihypertensives outpatient  Acquired hypothyroidism -continue levothyroxine  Hyperlipidemia -continue  simvastatin  Depression/anxiety -continue Zoloft, mirtazapine, Celexa, and Xanax  Insomnia -continue melatonin   Pressure Injury -   Pressure Injury 06/10/17 Deep Tissue Injury - Purple or maroon localized area of discolored intact skin or blood-filled blister due to damage of underlying soft tissue from pressure and/or shear. (Active)  06/10/17 2340  Location: Sacrum  Location Orientation: Medial  Staging: Deep Tissue Injury - Purple or maroon localized area of discolored intact skin or blood-filled blister due to damage of underlying soft tissue from pressure and/or shear.  Wound Description (Comments):   Present on Admission: Yes     Pressure Injury 06/12/17 Stage II -  Partial thickness loss of dermis presenting as a shallow open ulcer with a red, pink wound bed without slough. (Active)  06/12/17   Location: Hip  Location Orientation: Right  Staging: Stage II -  Partial thickness loss of dermis presenting as a shallow open ulcer with a red, pink wound bed without slough.  Wound Description (Comments):   Present on Admission: Yes     Pressure Injury 08/20/19 Coccyx Medial Stage 2 -  Partial thickness loss of dermis presenting as a shallow open injury with a red, pink wound bed without slough. (Active)  08/20/19 0500  Location: Coccyx  Location Orientation: Medial  Staging: Stage 2 -  Partial thickness loss of dermis presenting as a shallow open injury with a red, pink wound bed without slough.  Wound Description (Comments):   Present on Admission: Yes      Patient BMI: Body mass index is 28.22 kg/m.   DVT prophylaxis: enoxaparin (LOVENOX) injection 40 mg Start: 08/21/19 2200   Diet:  Diet Orders (From admission, onward)    Start     Ordered   08/22/19 0925  Diet renal with fluid restriction Fluid restriction: 1200 mL Fluid; Room service appropriate? Yes; Fluid consistency: Thin  Diet effective now       Question Answer Comment  Fluid restriction: 1200 mL Fluid     Room service appropriate? Yes   Fluid consistency: Thin      08/22/19 0926            Code Status: Full Code    Subjective 08/23/19    Patient seen at bedside this morning.  No acute events reported overnight.  She was sleeping but awoke to voice.  Says she feels okay, little better than yesterday.  Denies fevers or chills, nausea vomiting, chest pain shortness of breath or other acute complaints.    Disposition Plan & Communication   Status is: Inpatient  Remains inpatient appropriate because:IV treatments appropriate due to intensity of illness or inability to take PO.  Has recurrent AKI and worsening leukocytosis.   Dispo: The patient is from: SNF              Anticipated d/c is to: SNF              Anticipated d/c date is: 1-2 days              Patient currently is not medically stable to d/c.    Family Communication: None at bedside during encounter, will attempt to call   Consults, Procedures, Significant Events   Consultants:   None  Procedures:   Foley placed in the ER 7/27, discontinued 7/30 for void trial  Antimicrobials:  Rocephin 7/27 >> 7/30  Ampicillin 7/30 >>      Objective   Vitals:   08/22/19 2032 08/23/19 0300 08/23/19 0458 08/23/19 0800  BP: (!) 110/57  (!) 134/63 (!) 100/62  Pulse: 67  70 80  Resp: 19  20 20   Temp: 98.3 F (36.8 C)  97.9 F (36.6 C) 97.9 F (36.6 C)  TempSrc:   Oral Oral  SpO2: 100%  96% 98%  Weight:  74.6 kg    Height:        Intake/Output Summary (Last 24 hours) at 08/23/2019 0919 Last data filed at 08/23/2019 0617 Gross per 24 hour  Intake 100.51 ml  Output 0 ml  Net 100.51 ml   Filed Weights   08/20/19 0044 08/22/19 0448 08/23/19 0300  Weight: 74.3 kg 73.9 kg 74.6 kg    Physical Exam:  General exam: awake, alert, no acute distress, obese HEENT: Moist mucous membranes, poor dentition, hearing grossly normal Respiratory system: CTAB, respiratory effort. Cardiovascular system: normal S1/S2, RRR,  no pitting edema.  Gastrointestinal system: soft, NT, ND, +bowel sounds. Extremities: moves all, no cyanosis, normal tone Psychiatry: normal mood, flat affect  Labs   Data Reviewed: I have personally reviewed following labs and imaging studies  CBC: Recent Labs  Lab 08/19/19 1858 08/20/19 0420 08/21/19 0548 08/22/19 0608 08/23/19 0545  WBC 17.9* 17.0* 16.0* 17.4* 19.1*  NEUTROABS 16.5*  --  14.3* 14.8* 16.6*  HGB 13.9 12.0 11.0* 10.5* 11.1*  HCT 42.1 35.8* 31.2* 30.7* 33.8*  MCV 94.2 92.3 91.5 91.4 94.7  PLT 415* 340 242 251 732   Basic Metabolic Panel: Recent Labs  Lab 08/19/19 1858 08/20/19 0420 08/21/19 0548 08/22/19 0608 08/23/19 0545  NA 137 137 140 142 139  K 4.8 4.4 4.3 4.2 4.3  CL 104 110 114* 116* 108  CO2 19* 18* 18* 19* 21*  GLUCOSE 125* 176* 117* 108* 110*  BUN 74* 67* 48* 33* 40*  CREATININE 2.43* 1.85* 1.09* 0.84 1.21*  CALCIUM 8.3* 7.8* 7.7* 8.0* 8.1*  MG  --   --  1.8  --   --    GFR: Estimated Creatinine Clearance: 42.8 mL/min (A) (by C-G formula based on SCr of 1.21 mg/dL (H)). Liver Function Tests: Recent Labs  Lab 08/19/19 1858 08/21/19 0548  AST 271* 103*  ALT 24 64*  ALKPHOS 137* 94  BILITOT 1.2 1.0  PROT 7.8 6.2*  ALBUMIN 2.6* 1.8*   No results for input(s): LIPASE, AMYLASE in the last 168 hours. No results for input(s): AMMONIA in the last 168 hours. Coagulation Profile: Recent Labs  Lab 08/20/19 0420  INR 1.3*   Cardiac Enzymes: Recent Labs  Lab 08/19/19 1858  CKTOTAL 22*   BNP (last 3 results) No results for input(s): PROBNP in the last 8760 hours. HbA1C: No results for input(s): HGBA1C in the last 72 hours. CBG: Recent Labs  Lab 08/22/19 0840 08/22/19 1158 08/22/19 1642 08/22/19 2001 08/23/19 0743  GLUCAP 102* 132* 130* 133* 109*   Lipid Profile: No results for input(s): CHOL, HDL, LDLCALC, TRIG, CHOLHDL, LDLDIRECT in the last 72 hours. Thyroid Function Tests: No results for input(s): TSH, T4TOTAL,  FREET4, T3FREE, THYROIDAB in the last 72 hours. Anemia Panel: No results for input(s): VITAMINB12, FOLATE, FERRITIN, TIBC, IRON, RETICCTPCT in the last 72 hours. Sepsis Labs: Recent Labs  Lab 08/19/19 1858 08/20/19 0420  PROCALCITON 4.62 3.30  LATICACIDVEN 2.5* 1.9    Recent Results (from the past 240 hour(s))  Blood Culture (routine x  2)     Status: Abnormal   Collection Time: 08/19/19  6:59 PM   Specimen: BLOOD  Result Value Ref Range Status   Specimen Description   Final    BLOOD RIGHT ANTECUBITAL Performed at [facility], [address] [place], [place] [address]    Special Requests   Final    BOTTLES DRAWN AEROBIC AND ANAEROBIC Blood Culture adequate volume Performed at [facility], [address] [place], [place] [address]    Culture  Setup Time   Final    GRAM NEGATIVE RODS IN BOTH AEROBIC AND ANAEROBIC BOTTLES CRITICAL VALUE NOTED.  VALUE IS CONSISTENT WITH PREVIOUSLY REPORTED AND CALLED VALUE. Performed at [facility], [address] [place], [place] [address]    Culture (A)  Final    PROTEUS MIRABILIS SUSCEPTIBILITIES PERFORMED ON PREVIOUS CULTURE WITHIN THE LAST 5 DAYS. Performed at [facility], [place] [address] [place], [place] [address]    Report Status 08/22/2019 FINAL  Final  Blood Culture (routine x 2)     Status: Abnormal   Collection Time: 08/19/19  7:03 PM   Specimen: BLOOD  Result Value Ref Range Status   Specimen Description   Final    BLOOD LEFT ANTECUBITAL Performed at [facility], [address], [place], [place] [address]    Special Requests   Final    BOTTLES DRAWN AEROBIC AND ANAEROBIC Blood Culture adequate volume Performed at [facility], [address] [place], [place] [address]    Culture  Setup Time   Final    GRAM NEGATIVE RODS IN BOTH AEROBIC AND ANAEROBIC BOTTLES CRITICAL RESULT CALLED TO, READ BACK BY AND VERIFIED WITH: MYRA SLAUGHTER AT 0824 ON 08/20/2019  [place]. Performed at [facility], [place] [address] [place], [place] [address]    Culture PROTEUS MIRABILIS (A)  Final   Report Status 08/22/2019 FINAL  Final   Organism ID, Bacteria PROTEUS MIRABILIS  Final      Susceptibility   Proteus mirabilis - MIC*    AMPICILLIN <=2 SENSITIVE Sensitive     CEFAZOLIN <=4 SENSITIVE Sensitive     CEFEPIME <=0.12 SENSITIVE Sensitive     CEFTAZIDIME <=1 SENSITIVE Sensitive     CEFTRIAXONE <=0.25 SENSITIVE Sensitive     CIPROFLOXACIN 0.5 SENSITIVE Sensitive     GENTAMICIN <=1 SENSITIVE Sensitive     IMIPENEM 2 SENSITIVE Sensitive     TRIMETH/SULFA <=20 SENSITIVE Sensitive     AMPICILLIN/SULBACTAM <=2 SENSITIVE Sensitive     PIP/TAZO <=4 SENSITIVE Sensitive     * PROTEUS MIRABILIS  Blood Culture ID Panel (Reflexed)     Status: Abnormal   Collection Time: 08/19/19  7:03 PM  Result Value Ref Range Status   Enterococcus species NOT DETECTED NOT DETECTED Final   Listeria monocytogenes NOT DETECTED NOT DETECTED Final   Staphylococcus species NOT DETECTED NOT DETECTED Final   Staphylococcus aureus (BCID) NOT DETECTED NOT DETECTED Final   Streptococcus species NOT DETECTED NOT DETECTED Final   Streptococcus agalactiae NOT DETECTED NOT DETECTED Final   Streptococcus pneumoniae NOT DETECTED NOT DETECTED Final   Streptococcus pyogenes NOT DETECTED NOT DETECTED Final   Acinetobacter baumannii NOT DETECTED NOT DETECTED Final   Enterobacteriaceae species DETECTED (A) NOT DETECTED Final    Comment: Enterobacteriaceae represent a large family of gram-negative bacteria, not a single organism. CRITICAL RESULT CALLED TO, READ BACK BY AND VERIFIED WITH: MYRA SLAUGHTER AT 9629 ON 08/20/2019 [place].    Enterobacter cloacae  complex NOT DETECTED NOT DETECTED Final   Escherichia coli NOT DETECTED NOT DETECTED Final   Klebsiella oxytoca NOT DETECTED NOT DETECTED Final   Klebsiella pneumoniae NOT DETECTED NOT DETECTED Final   Proteus species DETECTED (A) NOT DETECTED  Final    Comment: CRITICAL RESULT CALLED TO, READ BACK BY AND VERIFIED WITH: MYRA SLAUGHTER AT 0824 ON 08/20/2019 [place].    Serratia marcescens NOT DETECTED NOT DETECTED Final   Carbapenem resistance NOT DETECTED NOT DETECTED Final   Haemophilus influenzae NOT DETECTED NOT DETECTED Final   Neisseria meningitidis NOT DETECTED NOT DETECTED Final   Pseudomonas aeruginosa NOT DETECTED NOT DETECTED Final   Candida albicans NOT DETECTED NOT DETECTED Final   Candida glabrata NOT DETECTED NOT DETECTED Final   Candida krusei NOT DETECTED NOT DETECTED Final   Candida parapsilosis NOT DETECTED NOT DETECTED Final   Candida tropicalis NOT DETECTED NOT DETECTED Final    Comment: Performed at [facility], [address], [place], [place] [address]  Urine culture     Status: Abnormal   Collection Time: 08/19/19  9:15 PM   Specimen: In/Out Cath Urine  Result Value Ref Range Status   Specimen Description   Final    IN/OUT CATH URINE Performed at [facility], [address] [place], [place] [address]    Special Requests   Final    NONE Performed at [facility], [place],  [address]    Culture >=100,000 COLONIES/mL PROTEUS MIRABILIS (A)  Final   Report Status 08/22/2019 FINAL  Final   Organism ID, Bacteria PROTEUS MIRABILIS (A)  Final      Susceptibility   Proteus mirabilis - MIC*    AMPICILLIN <=2 SENSITIVE Sensitive     CEFAZOLIN <=4 SENSITIVE Sensitive     CEFTRIAXONE <=0.25 SENSITIVE Sensitive     CIPROFLOXACIN <=0.25 SENSITIVE Sensitive     GENTAMICIN <=1 SENSITIVE Sensitive     IMIPENEM 2 SENSITIVE Sensitive     NITROFURANTOIN 128 RESISTANT Resistant     TRIMETH/SULFA <=20 SENSITIVE Sensitive     AMPICILLIN/SULBACTAM <=2 SENSITIVE Sensitive     PIP/TAZO <=4 SENSITIVE Sensitive     * >=100,000 COLONIES/mL PROTEUS MIRABILIS  SARS Coronavirus 2 by RT PCR (hospital order, performed in [facility] hospital lab) Nasopharyngeal Nasopharyngeal  Swab     Status: None   Collection Time: 08/19/19  9:15 PM   Specimen: Nasopharyngeal Swab  Result Value Ref Range Status   SARS Coronavirus 2 NEGATIVE NEGATIVE Final    Comment: (NOTE) SARS-CoV-2 target nucleic acids are NOT DETECTED.  The SARS-CoV-2 RNA is generally detectable in upper and lower respiratory specimens during the acute phase of infection. The lowest concentration of SARS-CoV-2 viral copies this assay can detect is 250 copies / mL. A negative result does not preclude SARS-CoV-2 infection and should not be used as the sole basis for treatment or other patient management decisions.  A negative result may occur with improper specimen collection / handling, submission of specimen other than nasopharyngeal swab, presence of viral mutation(s) within the areas targeted by this assay, and inadequate number of viral copies (<250 copies / mL). A negative result must be combined with clinical observations, patient history, and epidemiological information.  Fact Sheet for Patients:   StrictlyIdeas.no  Fact Sheet for Healthcare Providers: BankingDealers.co.za  This test is not yet approved or  cleared by the [place] FDA and has been authorized for detection and/or diagnosis of SARS-CoV-2 by FDA under an Emergency  Use Authorization (EUA).  This EUA will remain in effect (meaning this test can be used) for the duration of the COVID-19 declaration under Section 564(b)(1) of the Act, 21 U.S.C. section 360bbb-3(b)(1), unless the authorization is terminated or revoked sooner.  Performed at [facility], [address] [place], [place] [address]   MRSA PCR Screening     Status: None   Collection Time: 08/20/19  5:55 AM   Specimen: Nasopharyngeal  Result Value Ref Range Status   MRSA by PCR NEGATIVE NEGATIVE Final    Comment:        The GeneXpert MRSA Assay (FDA approved for NASAL specimens only), is one component  of a comprehensive MRSA colonization surveillance program. It is not intended to diagnose MRSA infection nor to guide or monitor treatment for MRSA infections. Performed at [facility], [address], [place],  [address]       Imaging Studies   US Abdomen Limited RUQ  Result Date: 08/22/2019 CLINICAL DATA:  Elevated liver enzymes EXAM: ULTRASOUND ABDOMEN LIMITED RIGHT UPPER QUADRANT COMPARISON:  CT abdomen and pelvis August 19, 2019 FINDINGS: Surgically absent. Common bile duct: Diameter: 6 mm. No intrahepatic or extrahepatic biliary duct dilatation. Liver: No focal lesion identified. Liver echogenicity is somewhat inhomogeneous without focal liver lesion demonstrable. Portal vein is patent on color Doppler imaging with normal direction of blood flow towards the liver. Other: None. IMPRESSION: 1. Liver echogenicity is somewhat inhomogeneous which may indicate fatty infiltration. No focal liver lesions are demonstrable by ultrasound. 2.  Gallbladder absent. Electronically Signed   By: Lowella Grip III M.D.   On: 08/22/2019 08:15     Medications   Scheduled Meds: . ALPRAZolam  0.25 mg Oral q morning - 10a  . calcium-vitamin D  1 tablet Oral Daily  . carbidopa-levodopa  1 tablet Oral BID  . Chlorhexidine Gluconate Cloth  6 each Topical Q0600  . citalopram  40 mg Oral Daily  . docusate sodium  100 mg Oral BID  . enoxaparin (LOVENOX) injection  40 mg Subcutaneous Q24H  . feeding supplement (ENSURE ENLIVE)  237 mL Oral TID BM  . insulin aspart  0-15 Units Subcutaneous TID WC  . insulin aspart  0-5 Units Subcutaneous QHS  . levothyroxine  175 mcg Oral Q0600  . mirtazapine  7.5 mg Oral QHS  . multivitamin with minerals  1 tablet Oral Daily  . multivitamin with minerals  1 tablet Oral Daily  . pantoprazole  40 mg Oral Daily  . rOPINIRole  0.5 mg Oral BID  . rOPINIRole  1 mg Oral QHS  . sertraline  50 mg Oral Daily  . simvastatin  40 mg Oral QHS  . tamsulosin   0.4 mg Oral QPC supper   Continuous Infusions: . ampicillin (OMNIPEN) IV Stopped (08/23/19 0547)  . lactated ringers         LOS: 4 days    Time spent: 30 minutes    Ezekiel Slocumb, DO [facility]  08/23/2019, 9:19 AM    If 7PM-7AM, please contact night-coverage. How to contact the [facility] Attending or Consulting provider [place] or covering provider during after hours [place], for this patient?    1. Check the care team in [facility] and look for a) attending/consulting TRH provider listed and b) the [facility] team listed 2. Log into [email or web address] and use [facility]'s universal password to access. If you do not have the password, please contact the hospital operator. 3. Locate the [facility]  provider you are looking for under [facility] and page to a number that you can be directly reached. 4. If you still have difficulty reaching the provider, please page the [facility] (Director on Call) for the Hospitalists listed on amion for assistance.

## 2019-08-24 ENCOUNTER — Inpatient Hospital Stay: Payer: Medicare Other

## 2019-08-24 DIAGNOSIS — G20 Parkinson's disease: Secondary | ICD-10-CM

## 2019-08-24 DIAGNOSIS — F0280 Dementia in other diseases classified elsewhere without behavioral disturbance: Secondary | ICD-10-CM

## 2019-08-24 LAB — CBC WITH DIFFERENTIAL/PLATELET
Abs Immature Granulocytes: 0.19 10*3/uL — ABNORMAL HIGH (ref 0.00–0.07)
Basophils Absolute: 0 10*3/uL (ref 0.0–0.1)
Basophils Relative: 0 %
Eosinophils Absolute: 0.1 10*3/uL (ref 0.0–0.5)
Eosinophils Relative: 1 %
HCT: 30.9 % — ABNORMAL LOW (ref 36.0–46.0)
Hemoglobin: 10.5 g/dL — ABNORMAL LOW (ref 12.0–15.0)
Immature Granulocytes: 1 %
Lymphocytes Relative: 13 %
Lymphs Abs: 1.7 10*3/uL (ref 0.7–4.0)
MCH: 31.3 pg (ref 26.0–34.0)
MCHC: 34 g/dL (ref 30.0–36.0)
MCV: 92.2 fL (ref 80.0–100.0)
Monocytes Absolute: 0.5 10*3/uL (ref 0.1–1.0)
Monocytes Relative: 4 %
Neutro Abs: 10.8 10*3/uL — ABNORMAL HIGH (ref 1.7–7.7)
Neutrophils Relative %: 81 %
Platelets: 269 10*3/uL (ref 150–400)
RBC: 3.35 MIL/uL — ABNORMAL LOW (ref 3.87–5.11)
RDW: 13.5 % (ref 11.5–15.5)
WBC: 13.4 10*3/uL — ABNORMAL HIGH (ref 4.0–10.5)
nRBC: 0 % (ref 0.0–0.2)

## 2019-08-24 LAB — MAGNESIUM: Magnesium: 1.6 mg/dL — ABNORMAL LOW (ref 1.7–2.4)

## 2019-08-24 LAB — COMPREHENSIVE METABOLIC PANEL
ALT: 20 U/L (ref 0–44)
AST: 101 U/L — ABNORMAL HIGH (ref 15–41)
Albumin: 1.8 g/dL — ABNORMAL LOW (ref 3.5–5.0)
Alkaline Phosphatase: 93 U/L (ref 38–126)
Anion gap: 9 (ref 5–15)
BUN: 31 mg/dL — ABNORMAL HIGH (ref 8–23)
CO2: 20 mmol/L — ABNORMAL LOW (ref 22–32)
Calcium: 7.8 mg/dL — ABNORMAL LOW (ref 8.9–10.3)
Chloride: 107 mmol/L (ref 98–111)
Creatinine, Ser: 0.76 mg/dL (ref 0.44–1.00)
GFR calc Af Amer: >60 mL/min (ref >60)
GFR calc non Af Amer: >60 mL/min (ref >60)
Glucose, Bld: 135 mg/dL — ABNORMAL HIGH (ref 70–99)
Potassium: 4.1 mmol/L (ref 3.5–5.1)
Sodium: 136 mmol/L (ref 135–145)
Total Bilirubin: 0.8 mg/dL (ref 0.3–1.2)
Total Protein: 6.5 g/dL (ref 6.5–8.1)

## 2019-08-24 LAB — GLUCOSE, CAPILLARY
Glucose-Capillary: 113 mg/dL — ABNORMAL HIGH (ref 70–99)
Glucose-Capillary: 131 mg/dL — ABNORMAL HIGH (ref 70–99)
Glucose-Capillary: 142 mg/dL — ABNORMAL HIGH (ref 70–99)
Glucose-Capillary: 143 mg/dL — ABNORMAL HIGH (ref 70–99)

## 2019-08-24 MED ORDER — CEFAZOLIN SODIUM-DEXTROSE 2-4 GM/100ML-% IV SOLN
2.0000 g | Freq: Three times a day (TID) | INTRAVENOUS | Status: DC
  Administered 2019-08-24 – 2019-08-26 (×5): 2 g via INTRAVENOUS
  Filled 2019-08-24 (×9): qty 100

## 2019-08-24 MED ORDER — CLINDAMYCIN PHOSPHATE 900 MG/50ML IV SOLN
900.0000 mg | Freq: Three times a day (TID) | INTRAVENOUS | Status: DC
  Administered 2019-08-25 (×2): 900 mg via INTRAVENOUS
  Filled 2019-08-24 (×3): qty 50

## 2019-08-24 NOTE — Progress Notes (Signed)
PT Cancellation Note  Patient Details Name: Theresa Mills MRN: [id number] DOB: January 07, 1951   Cancelled Treatment:    Reason Eval/Treat Not Completed: Patient not medically ready, MD asked therapy  to hold today.    [address], [place], [place] DPT 08/24/2019, 3:21 PM

## 2019-08-24 NOTE — Progress Notes (Signed)
Just bladder scan pt. Over 791 cc of urine present. Dr. Arbutus Ped made aware. Nephrology consulted, foley catheter will be replaced.

## 2019-08-24 NOTE — Progress Notes (Signed)
Cross Cover Brief Note  Lumbar spine MRI results called to me from radioloist IMPRESSION: 1. Findings concerning for osteomyelitis discitis involving the L3-4 interspace as above. Associated epidural abscess at the L3-4 level with resultant moderate to severe spinal stenosis. 2. Abnormal edema with joint effusions involving the bilateral L3-4 facets, concerning for concomitant septic arthritis. 3. Associated paraspinous inflammation centered about the L3-4 interspace with superimposed 1.7 x 1.7 x 4.9 cm collection within the right psoas muscle, likely abscess. 4. Superimposed acute to subacute compression deformities involving the inferior endplate of M4-26% height loss with 2 mm bony retropulsion, and superior endplate of S3-41% height loss with 3 mm bony retropulsion. 5. Additional fluid signal intensity within the L1-2 and L2-3 interspaces, favored to be degenerative, although additional levels of discitis somewhat difficult to exclude. Attention at follow-up recommended. 6. Chronic bilateral pars defects at L5-S1 with associated 8 mm spondylolisthesis of L5 on S1 with moderate to severe left L5 foraminal stenosis. 7. Underlying moderate levoscoliosis with additional moderate multilevel degenerative spondylosis elsewhere as above.  Results were called by telephone at the time of interpretation on 08/24/2019 at 10:49 pm to provider Sharion Settler, NP, who verbally acknowledged these results.  Antibiotic therapy discussed with pharmacy  Antibiotics changed to ancef and clindamycin ID consult Neurosurgery consult

## 2019-08-24 NOTE — TOC Progression Note (Signed)
Transition of Care [facility]) - Progression Note    Patient Details  Name: Theresa Mills MRN: [id number] Date of Birth: April 05, 1950  Transition of Care [facility]) CM/SW Contact  Meriel Flavors, [place] Phone Number: 08/24/2019, 4:18 PM  Clinical Narrative:    Patient was set to discharge today back to [facility], doctor postponed discharge due to foley cath had to be replaced. Discharge rescheduled for Monday. Claiborne Billings with [facility] is aware        Expected Discharge Plan and Services                                                 Social Determinants of Health (SDOH) Interventions    Readmission Risk Interventions No flowsheet data found.

## 2019-08-24 NOTE — Plan of Care (Signed)
  Problem: Clinical Measurements: Goal: Will remain free from infection Outcome: Progressing  IV Abx Problem: Nutrition: Goal: Adequate nutrition will be maintained Outcome: Progressing  Supplements

## 2019-08-24 NOTE — Progress Notes (Signed)
PROGRESS NOTE    Theresa Mills   HRC:[id number]  DOB: 04-Dec-1950  PCP: Ronnell Freshwater, NP    DOA: 08/19/2019 LOS: 5   Brief Narrative   Theresa Mills is a 69 y.o. female with medical history of DM, GERD, anxiety, OSA, Parkinson's with dementia, hypothyroidism, frequent falls, brought in 08/19/19 by EMS after SNF staff called due to shortness of breath and altered mental status.  Patient complained of abdominal pain and neck pain but could not provide additional history due to dementia and altered mental status.  In the ED, patient was lethargic, afebrile, hypotensive with BP 83/41, HR 90, RR 24, O2 sat 93% on room air.  Labs were notable for leukocytosis 18k, lactic acidosis 2.5.  UA showed pyuria.  Creatinine was 2.43 (baseline 0.69), procalcitonin 4.62, troponin 28.  CT abdomen pelvis showed distended urinary bladder with bilateral hydronephrosis and findings concerning for pyelonephritis, several nonobstructing left renal calculi.  No focal consolidation was seen on CT chest.  EKG was normal sinus rhythm.  Patient's blood pressure improved with IV fluid bolus.  Started on IV antibiotics and admitted to hospitalist service for further evaluation and management.     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  Imaging findings and UA are consistent with  pyelonephritis.   Sepsis physiology has improved and lactic acidosis resolved.  Blood cultures grew Proteus and Enterobacteriaceae spp.  Transition to ampicillin today, amoxicillin on discharge.  Day 4 of antibiotics.    Off IV fluids now taking PO well.    Bilateral hydronephrosis secondary to acute urinary retention -present on admission, CT abdomen showed distended bladder and bilateral hydronephrosis.  Foley catheter removed this AM for voiding trial.  Bladder scans - 791+ retained today.  Foley replaced.  Flomax started 7/29.  Urology consult tomorrow (no weekend coverage available).  MRI lumbar spine to rule out cauda equina syndrome.    Acute kidney injury - Resolved with Foley and IV fluids.  Present on admission with creatinine 2.43, prior baseline 0.69.  Likely multifactorial with sepsis and hypotension causing prerenal azotemia and post renal secondary to hydronephrosis and acute urinary retention.   Improved with Foley and IVF's.  Foley d/c'd yesterday.  Reportedly she is voiding but Cr up again today 0.84>>1.21. Resume gentle IV fluids.  Replace Foley tomorrow if not improved.  Monitor urine output closely. Daily BMP to monitor.  Avoid nephrotoxins and hypotension.    Lumbar compression fractures- present on admission, seen on CT abdomen/pelvis in the ED, showed compression fractures of the superior endplate of L4 and inferior endplate of L3, likely acute.  Patient has not complained of back pain throughout admission.  Given her urinary retention, will get lumbar MRI without contrast and consider neurosurgery consult tomorrow.  Acute metabolic encephalopathy - Present on admission likely secondary to infection on top of baseline dementia.  Resolved with treatment of infection.  Transaminitis -present on admission, improving.  AST 271>>103, ALT 24>>64, alk phos 137>>94, normal total bili 1.2>>1.0.  RUQ showed liver echogenicity indicative of fatty infiltration, no focal liver lesions,  gallbladder absent..  Suspect due to severe sepsis and shock liver.  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  Continue Sinemet, ropinirole  Essential hypertension -not on antihypertensives outpatient  Acquired hypothyroidism -continue levothyroxine  Hyperlipidemia -continue simvastatin  Depression/anxiety -continue Zoloft, mirtazapine, Celexa, and Xanax  Insomnia -continue melatonin   Pressure Injury -   Pressure Injury 06/10/17 Deep Tissue Injury - Purple or maroon localized area of discolored intact skin or blood-filled blister due to damage of underlying soft tissue from pressure and/or shear. (Active)  06/10/17 2340  Location: Sacrum  Location Orientation: Medial  Staging: Deep Tissue Injury - Purple or maroon localized area of discolored intact skin or blood-filled blister due to damage of underlying soft tissue from pressure and/or shear.  Wound Description (Comments):   Present on Admission: Yes     Pressure Injury 06/12/17 Stage II -  Partial thickness loss of dermis presenting as a shallow open ulcer with a red, pink wound bed without slough. (Active)  06/12/17   Location: Hip  Location Orientation: Right  Staging: Stage II -  Partial thickness loss of dermis presenting as a shallow open ulcer with a red, pink wound bed without slough.  Wound Description (Comments):   Present on Admission: Yes     Pressure Injury 08/20/19 Coccyx Medial Stage 2 -  Partial thickness loss of dermis presenting as a shallow open injury with a red, pink wound bed without slough. (Active)  08/20/19 0500  Location: Coccyx  Location Orientation: Medial  Staging: Stage 2 -  Partial thickness loss of dermis presenting as a shallow open injury with a red, pink wound bed without slough.  Wound Description (Comments):   Present on Admission: Yes      Patient BMI: Body mass index is 28.22 kg/m.   DVT  prophylaxis: enoxaparin (LOVENOX) injection 40 mg Start: 08/21/19 2200   Diet:  Diet Orders (From admission, onward)    Start     Ordered   08/22/19 0925  Diet renal with fluid restriction Fluid restriction: 1200 mL Fluid; Room service appropriate? Yes; Fluid consistency: Thin  Diet effective now       Question Answer Comment  Fluid restriction: 1200 mL Fluid   Room service appropriate? Yes   Fluid consistency: Thin      08/22/19 0926            Code Status: Full Code    Subjective 08/24/19    Patient seen at bedside this morning.  No acute events reported overnight.  She was sleeping comfortably but awoke to voice.  She reports being very tired and falls back to sleep during our conversation.  She denies fevers or chills, pain, nausea vomiting, chest pain or shortness of breath.  She says she is urinating and thinks her bladder is emptying.  RN checked bladder scan and found over 791 cc retained.  Disposition Plan & Communication   Status is: Inpatient  Remains inpatient appropriate because:IV treatments appropriate due to intensity of illness or inability to take PO.  Continues to have acute urinary retention after voiding trial.  Urology consult pending.    Dispo: The patient is from: SNF              Anticipated d/c is to:  SNF              Anticipated d/c date is: 1-2 days              Patient currently is not medically stable to d/c.    Family Communication: None at bedside during encounter, will attempt to call   Consults, Procedures, Significant Events   Consultants:   None  Procedures:   Foley placed in the ER 7/27, discontinued 7/30 for void trial  Antimicrobials:   Rocephin 7/27 >> 7/30  Ampicillin 7/30 >>      Objective   Vitals:   08/23/19 1921 08/24/19 0440 08/24/19 0733 08/24/19 1146  BP: 119/78 (!) 91/60  (!) 117/60  Pulse: 80 69 69 77  Resp: 20 20    Temp: 98.4 F (36.9 C) 97.7 F (36.5 C) 98.3 F (36.8 C) 98.1 F (36.7 C)    TempSrc: Oral Oral Axillary Oral  SpO2: 98% 95% 95% 97%  Weight:      Height:        Intake/Output Summary (Last 24 hours) at 08/24/2019 1336 Last data filed at 08/24/2019 1000 Gross per 24 hour  Intake 357 ml  Output 1500 ml  Net -1143 ml   Filed Weights   08/20/19 0044 08/22/19 0448 08/23/19 0300  Weight: 74.3 kg 73.9 kg 74.6 kg    Physical Exam:  General exam: Sleeping, wakes to voice, no acute distress, obese HEENT: Dry mucous membranes, poor dentition, hearing grossly normal Respiratory system: CTAB, respiratory effort. Cardiovascular system: normal S1/S2, RRR, no pitting edema.  Gastrointestinal system: soft, NT, ND, +bowel sounds. Genitourinary: No suprapubic tenderness, suction canister with some urine present Extremities: moves all, no cyanosis, normal tone Psychiatry: normal mood, flat affect  Labs   Data Reviewed: I have personally reviewed following labs and imaging studies  CBC: Recent Labs  Lab 08/19/19 1858 08/19/19 1858 08/20/19 0420 08/21/19 0548 08/22/19 0608 08/23/19 0545 08/24/19 1035  WBC 17.9*   < > 17.0* 16.0* 17.4* 19.1* 13.4*  NEUTROABS 16.5*  --   --  14.3* 14.8* 16.6* 10.8*  HGB 13.9   < > 12.0 11.0* 10.5* 11.1* 10.5*  HCT 42.1   < > 35.8* 31.2* 30.7* 33.8* 30.9*  MCV 94.2   < > 92.3 91.5 91.4 94.7 92.2  PLT 415*   < > 340 242 251 264 269   < > = values in this interval not displayed.   Basic Metabolic Panel: Recent Labs  Lab 08/20/19 0420 08/21/19 0548 08/22/19 0608 08/23/19 0545 08/24/19 1035  NA 137 140 142 139 136  K 4.4 4.3 4.2 4.3 4.1  CL 110 114* 116* 108 107  CO2 18* 18* 19* 21* 20*  GLUCOSE 176* 117* 108* 110* 135*  BUN 67* 48* 33* 40* 31*  CREATININE 1.85* 1.09* 0.84 1.21* 0.76  CALCIUM 7.8* 7.7* 8.0* 8.1* 7.8*  MG  --  1.8  --   --  1.6*   GFR: Estimated Creatinine Clearance: 64.8 mL/min (by C-G formula based on SCr of 0.76 mg/dL). Liver Function Tests: Recent Labs  Lab 08/19/19 1858 08/21/19 0548  08/24/19 1035  AST 271* 103* 101*  ALT 24 64* 20  ALKPHOS 137* 94 93  BILITOT 1.2 1.0 0.8  PROT 7.8 6.2* 6.5  ALBUMIN 2.6* 1.8* 1.8*   No results for input(s): LIPASE, AMYLASE in the last 168 hours. No results for input(s): AMMONIA in the last 168 hours. Coagulation Profile: Recent Labs  Lab 08/20/19 0420  INR 1.3*  Cardiac Enzymes: Recent Labs  Lab 08/19/19 1858  CKTOTAL 22*   BNP (last 3 results) No results for input(s): PROBNP in the last 8760 hours. HbA1C: No results for input(s): HGBA1C in the last 72 hours. CBG: Recent Labs  Lab 08/23/19 1135 08/23/19 1638 08/23/19 2155 08/24/19 0736 08/24/19 1148  GLUCAP 189* 90 118* 113* 131*   Lipid Profile: No results for input(s): CHOL, HDL, LDLCALC, TRIG, CHOLHDL, LDLDIRECT in the last 72 hours. Thyroid Function Tests: No results for input(s): TSH, T4TOTAL, FREET4, T3FREE, THYROIDAB in the last 72 hours. Anemia Panel: No results for input(s): VITAMINB12, FOLATE, FERRITIN, TIBC, IRON, RETICCTPCT in the last 72 hours. Sepsis Labs: Recent Labs  Lab 08/19/19 1858 08/20/19 0420  PROCALCITON 4.62 3.30  LATICACIDVEN 2.5* 1.9    Recent Results (from the past 240 hour(s))  Blood Culture (routine x 2)     Status: Abnormal   Collection Time: 08/19/19  6:59 PM   Specimen: BLOOD  Result Value Ref Range Status   Specimen Description   Final    BLOOD RIGHT ANTECUBITAL Performed at [facility], [address] [place], [place] [address]    Special Requests   Final    BOTTLES DRAWN AEROBIC AND ANAEROBIC Blood Culture adequate volume Performed at [facility], [address], [place], [place] [address]    Culture  Setup Time   Final    GRAM NEGATIVE RODS IN BOTH AEROBIC AND ANAEROBIC BOTTLES CRITICAL VALUE NOTED.  VALUE IS CONSISTENT WITH PREVIOUSLY REPORTED AND CALLED VALUE. Performed at [facility], [address] [place], [place] [address]    Culture (A)  Final    PROTEUS  MIRABILIS SUSCEPTIBILITIES PERFORMED ON PREVIOUS CULTURE WITHIN THE LAST 5 DAYS. Performed at [facility], [place] [address], [place], [place] [address]    Report Status 08/22/2019 FINAL  Final  Blood Culture (routine x 2)     Status: Abnormal   Collection Time: 08/19/19  7:03 PM   Specimen: BLOOD  Result Value Ref Range Status   Specimen Description   Final    BLOOD LEFT ANTECUBITAL Performed at [facility], [address] [place], [place] [address]    Special Requests   Final    BOTTLES DRAWN AEROBIC AND ANAEROBIC Blood Culture adequate volume Performed at [facility], [address], [place], [place] [address]    Culture  Setup Time   Final    GRAM NEGATIVE RODS IN BOTH AEROBIC AND ANAEROBIC BOTTLES CRITICAL RESULT CALLED TO, READ BACK BY AND VERIFIED WITH: MYRA SLAUGHTER AT 0824 ON 08/20/2019 [place]. Performed at [facility], [place] [address] [place], [place] [address]    Culture PROTEUS MIRABILIS (A)  Final   Report Status 08/22/2019 FINAL  Final   Organism ID, Bacteria PROTEUS MIRABILIS  Final      Susceptibility   Proteus mirabilis - MIC*    AMPICILLIN <=2 SENSITIVE Sensitive     CEFAZOLIN <=4 SENSITIVE Sensitive     CEFEPIME <=0.12 SENSITIVE Sensitive     CEFTAZIDIME <=1 SENSITIVE Sensitive     CEFTRIAXONE <=0.25 SENSITIVE Sensitive     CIPROFLOXACIN 0.5 SENSITIVE Sensitive     GENTAMICIN <=1 SENSITIVE Sensitive     IMIPENEM 2 SENSITIVE Sensitive     TRIMETH/SULFA <=20 SENSITIVE Sensitive     AMPICILLIN/SULBACTAM <=2 SENSITIVE Sensitive     PIP/TAZO <=4 SENSITIVE Sensitive     * PROTEUS MIRABILIS  Blood Culture ID Panel (Reflexed)  Status: Abnormal   Collection Time: 08/19/19  7:03 PM  Result Value Ref Range Status   Enterococcus species NOT DETECTED NOT DETECTED Final   Listeria monocytogenes NOT DETECTED NOT DETECTED Final   Staphylococcus species NOT DETECTED NOT DETECTED Final   Staphylococcus aureus (BCID) NOT DETECTED NOT  DETECTED Final   Streptococcus species NOT DETECTED NOT DETECTED Final   Streptococcus agalactiae NOT DETECTED NOT DETECTED Final   Streptococcus pneumoniae NOT DETECTED NOT DETECTED Final   Streptococcus pyogenes NOT DETECTED NOT DETECTED Final   Acinetobacter baumannii NOT DETECTED NOT DETECTED Final   Enterobacteriaceae species DETECTED (A) NOT DETECTED Final    Comment: Enterobacteriaceae represent a large family of gram-negative bacteria, not a single organism. CRITICAL RESULT CALLED TO, READ BACK BY AND VERIFIED WITH: MYRA SLAUGHTER AT 6644 ON 08/20/2019 [place].    Enterobacter cloacae complex NOT DETECTED NOT DETECTED Final   Escherichia coli NOT DETECTED NOT DETECTED Final   Klebsiella oxytoca NOT DETECTED NOT DETECTED Final   Klebsiella pneumoniae NOT DETECTED NOT DETECTED Final   Proteus species DETECTED (A) NOT DETECTED Final    Comment: CRITICAL RESULT CALLED TO, READ BACK BY AND VERIFIED WITH: MYRA SLAUGHTER AT 0824 ON 08/20/2019 [place].    Serratia marcescens NOT DETECTED NOT DETECTED Final   Carbapenem resistance NOT DETECTED NOT DETECTED Final   Haemophilus influenzae NOT DETECTED NOT DETECTED Final   Neisseria meningitidis NOT DETECTED NOT DETECTED Final   Pseudomonas aeruginosa NOT DETECTED NOT DETECTED Final   Candida albicans NOT DETECTED NOT DETECTED Final   Candida glabrata NOT DETECTED NOT DETECTED Final   Candida krusei NOT DETECTED NOT DETECTED Final   Candida parapsilosis NOT DETECTED NOT DETECTED Final   Candida tropicalis NOT DETECTED NOT DETECTED Final    Comment: Performed at [facility], [address], [place], [place] [address]  Urine culture     Status: Abnormal   Collection Time: 08/19/19  9:15 PM   Specimen: In/Out Cath Urine  Result Value Ref Range Status   Specimen Description   Final    IN/OUT CATH URINE Performed at [facility], [address] [place], [place] [address]    Special Requests   Final    NONE Performed at  [facility], [place], [place] [address]    Culture >=100,000 COLONIES/mL PROTEUS MIRABILIS (A)  Final   Report Status 08/22/2019 FINAL  Final   Organism ID, Bacteria PROTEUS MIRABILIS (A)  Final      Susceptibility   Proteus mirabilis - MIC*    AMPICILLIN <=2 SENSITIVE Sensitive     CEFAZOLIN <=4 SENSITIVE Sensitive     CEFTRIAXONE <=0.25 SENSITIVE Sensitive     CIPROFLOXACIN <=0.25 SENSITIVE Sensitive     GENTAMICIN <=1 SENSITIVE Sensitive     IMIPENEM 2 SENSITIVE Sensitive     NITROFURANTOIN 128 RESISTANT Resistant     TRIMETH/SULFA <=20 SENSITIVE Sensitive     AMPICILLIN/SULBACTAM <=2 SENSITIVE Sensitive     PIP/TAZO <=4 SENSITIVE Sensitive     * >=100,000 COLONIES/mL PROTEUS MIRABILIS  SARS Coronavirus 2 by RT PCR (hospital order, performed in [place] hospital lab) Nasopharyngeal Nasopharyngeal Swab     Status: None   Collection Time: 08/19/19  9:15 PM   Specimen: Nasopharyngeal Swab  Result Value Ref Range Status   SARS Coronavirus 2 NEGATIVE NEGATIVE Final    Comment: (NOTE) SARS-CoV-2 target nucleic acids are NOT DETECTED.  The SARS-CoV-2 RNA is generally detectable in upper and lower respiratory specimens  during the acute phase of infection. The lowest concentration of SARS-CoV-2 viral copies this assay can detect is 250 copies / mL. A negative result does not preclude SARS-CoV-2 infection and should not be used as the sole basis for treatment or other patient management decisions.  A negative result may occur with improper specimen collection / handling, submission of specimen other than nasopharyngeal swab, presence of viral mutation(s) within the areas targeted by this assay, and inadequate number of viral copies (<250 copies / mL). A negative result must be combined with clinical observations, patient history, and epidemiological information.  Fact Sheet for Patients:   StrictlyIdeas.no  Fact Sheet for  Healthcare Providers: BankingDealers.co.za  This test is not yet approved or  cleared by the [place] FDA and has been authorized for detection and/or diagnosis of SARS-CoV-2 by FDA under an Emergency Use Authorization (EUA).  This EUA will remain in effect (meaning this test can be used) for the duration of the COVID-19 declaration under Section 564(b)(1) of the Act, 21 U.S.C. section 360bbb-3(b)(1), unless the authorization is terminated or revoked sooner.  Performed at [facility], [address] [place], [place] [address]   MRSA PCR Screening     Status: None   Collection Time: 08/20/19  5:55 AM   Specimen: Nasopharyngeal  Result Value Ref Range Status   MRSA by PCR NEGATIVE NEGATIVE Final    Comment:        The GeneXpert MRSA Assay (FDA approved for NASAL specimens only), is one component of a comprehensive MRSA colonization surveillance program. It is not intended to diagnose MRSA infection nor to guide or monitor treatment for MRSA infections. Performed at [facility] [address] [place], [place] [address]   CULTURE, BLOOD (ROUTINE X 2) w Reflex to ID Panel     Status: None (Preliminary result)   Collection Time: 08/23/19 10:33 AM   Specimen: BLOOD  Result Value Ref Range Status   Specimen Description BLOOD LAC  Final   Special Requests   Final    BOTTLES DRAWN AEROBIC AND ANAEROBIC Blood Culture adequate volume   Culture   Final    NO GROWTH < 24 HOURS Performed at [facility], [address] [place], [place] [address]    Report Status PENDING  Incomplete  CULTURE, BLOOD (ROUTINE X 2) w Reflex to ID Panel     Status: None (Preliminary result)   Collection Time: 08/23/19 10:34 AM   Specimen: BLOOD  Result Value Ref Range Status   Specimen Description BLOOD LFA  Final   Special Requests   Final    BOTTLES DRAWN AEROBIC AND ANAEROBIC Blood Culture adequate volume   Culture   Final    NO GROWTH <  24 HOURS Performed at [facility], [address] [place], [place] [address]    Report Status PENDING  Incomplete      Imaging Studies   No results found.   Medications   Scheduled Meds:  ALPRAZolam  0.25 mg Oral q morning - 10a   calcium-vitamin D  1 tablet Oral Daily   carbidopa-levodopa  1 tablet Oral BID   Chlorhexidine Gluconate Cloth  6 each Topical Q0600   citalopram  40 mg Oral Daily   docusate sodium  100 mg Oral BID   enoxaparin (LOVENOX) injection  40 mg Subcutaneous Q24H   feeding supplement (ENSURE ENLIVE)  237 mL Oral TID BM   insulin aspart  0-15 Units Subcutaneous TID WC  insulin aspart  0-5 Units Subcutaneous QHS   levothyroxine  175 mcg Oral Q0600   mirtazapine  7.5 mg Oral QHS   multivitamin with minerals  1 tablet Oral Daily   multivitamin with minerals  1 tablet Oral Daily   pantoprazole  40 mg Oral Daily   rOPINIRole  0.5 mg Oral BID   rOPINIRole  1 mg Oral QHS   sertraline  50 mg Oral Daily   simvastatin  40 mg Oral QHS   tamsulosin  0.4 mg Oral QPC supper   Continuous Infusions:  ampicillin (OMNIPEN) IV 2 g (08/24/19 0549)       LOS: 5 days    Time spent: 30 minutes    Ezekiel Slocumb, DO [facility]  08/24/2019, 1:36 PM    If 7PM-7AM, please contact night-coverage. How to contact the [facility] Attending or Consulting provider [place] or covering provider during after hours [place], for this patient?    1. Check the care team in [facility] and look for a) attending/consulting [facility] provider listed and b) the [facility] team listed 2. Log into [email or web address] and use [place]'s universal password to access. If you do not have the password, please contact the hospital operator. 3. Locate the [facility] provider you are looking for under [facility] and page to a number that you can be directly reached. 4. If you still have difficulty reaching the provider, please page the [facility] (Director on Call) for the Hospitalists  listed on amion for assistance.

## 2019-08-25 ENCOUNTER — Inpatient Hospital Stay: Payer: Medicare Other

## 2019-08-25 DIAGNOSIS — F419 Anxiety disorder, unspecified: Secondary | ICD-10-CM

## 2019-08-25 DIAGNOSIS — R7881 Bacteremia: Secondary | ICD-10-CM

## 2019-08-25 DIAGNOSIS — M4640 Discitis, unspecified, site unspecified: Secondary | ICD-10-CM | POA: Diagnosis present

## 2019-08-25 DIAGNOSIS — M4626 Osteomyelitis of vertebra, lumbar region: Secondary | ICD-10-CM | POA: Diagnosis present

## 2019-08-25 DIAGNOSIS — E039 Hypothyroidism, unspecified: Secondary | ICD-10-CM

## 2019-08-25 DIAGNOSIS — S32000A Wedge compression fracture of unspecified lumbar vertebra, initial encounter for closed fracture: Secondary | ICD-10-CM | POA: Diagnosis present

## 2019-08-25 DIAGNOSIS — B964 Proteus (mirabilis) (morganii) as the cause of diseases classified elsewhere: Secondary | ICD-10-CM

## 2019-08-25 DIAGNOSIS — M4646 Discitis, unspecified, lumbar region: Secondary | ICD-10-CM | POA: Diagnosis present

## 2019-08-25 DIAGNOSIS — N390 Urinary tract infection, site not specified: Secondary | ICD-10-CM

## 2019-08-25 DIAGNOSIS — K219 Gastro-esophageal reflux disease without esophagitis: Secondary | ICD-10-CM

## 2019-08-25 DIAGNOSIS — G2581 Restless legs syndrome: Secondary | ICD-10-CM

## 2019-08-25 DIAGNOSIS — E119 Type 2 diabetes mellitus without complications: Secondary | ICD-10-CM

## 2019-08-25 LAB — GLUCOSE, CAPILLARY
Glucose-Capillary: 107 mg/dL — ABNORMAL HIGH (ref 70–99)
Glucose-Capillary: 153 mg/dL — ABNORMAL HIGH (ref 70–99)
Glucose-Capillary: 151 mg/dL — ABNORMAL HIGH (ref 70–99)

## 2019-08-25 LAB — CBC WITH DIFFERENTIAL/PLATELET
Abs Immature Granulocytes: 0.2 10*3/uL — ABNORMAL HIGH (ref 0.00–0.07)
Basophils Absolute: 0 10*3/uL (ref 0.0–0.1)
Basophils Relative: 0 %
Eosinophils Absolute: 0.1 10*3/uL (ref 0.0–0.5)
Eosinophils Relative: 1 %
HCT: 30.3 % — ABNORMAL LOW (ref 36.0–46.0)
Hemoglobin: 10 g/dL — ABNORMAL LOW (ref 12.0–15.0)
Immature Granulocytes: 2 %
Lymphocytes Relative: 13 %
Lymphs Abs: 1.4 10*3/uL (ref 0.7–4.0)
MCH: 31 pg (ref 26.0–34.0)
MCHC: 33 g/dL (ref 30.0–36.0)
MCV: 93.8 fL (ref 80.0–100.0)
Monocytes Absolute: 0.5 10*3/uL (ref 0.1–1.0)
Monocytes Relative: 5 %
Neutro Abs: 9 10*3/uL — ABNORMAL HIGH (ref 1.7–7.7)
Neutrophils Relative %: 79 %
Platelets: 277 10*3/uL (ref 150–400)
RBC: 3.23 MIL/uL — ABNORMAL LOW (ref 3.87–5.11)
RDW: 13.2 % (ref 11.5–15.5)
WBC: 11.3 10*3/uL — ABNORMAL HIGH (ref 4.0–10.5)
nRBC: 0 % (ref 0.0–0.2)

## 2019-08-25 LAB — BASIC METABOLIC PANEL
Anion gap: 6 (ref 5–15)
BUN: 25 mg/dL — ABNORMAL HIGH (ref 8–23)
CO2: 23 mmol/L (ref 22–32)
Calcium: 7.6 mg/dL — ABNORMAL LOW (ref 8.9–10.3)
Chloride: 107 mmol/L (ref 98–111)
Creatinine, Ser: 0.65 mg/dL (ref 0.44–1.00)
GFR calc Af Amer: >60 mL/min (ref >60)
GFR calc non Af Amer: >60 mL/min (ref >60)
Glucose, Bld: 110 mg/dL — ABNORMAL HIGH (ref 70–99)
Potassium: 3.9 mmol/L (ref 3.5–5.1)
Sodium: 136 mmol/L (ref 135–145)

## 2019-08-25 LAB — MAGNESIUM: Magnesium: 1.7 mg/dL (ref 1.7–2.4)

## 2019-08-25 MED ORDER — FENTANYL CITRATE (PF) 100 MCG/2ML IJ SOLN
INTRAMUSCULAR | Status: AC | PRN
  Administered 2019-08-25: 25 ug via INTRAVENOUS

## 2019-08-25 MED ORDER — MIDAZOLAM HCL 5 MG/5ML IJ SOLN
INTRAMUSCULAR | Status: AC | PRN
  Administered 2019-08-25: 0.5 mg via INTRAVENOUS

## 2019-08-25 MED ORDER — MIDAZOLAM HCL 5 MG/5ML IJ SOLN
INTRAMUSCULAR | Status: AC
  Filled 2019-08-25: qty 5

## 2019-08-25 MED ORDER — SODIUM CHLORIDE 0.9% FLUSH
5.0000 mL | Freq: Three times a day (TID) | INTRAVENOUS | Status: DC
  Administered 2019-08-25 – 2019-09-03 (×21): 5 mL

## 2019-08-25 MED ORDER — FENTANYL CITRATE (PF) 100 MCG/2ML IJ SOLN
INTRAMUSCULAR | Status: AC
  Filled 2019-08-25: qty 2

## 2019-08-25 NOTE — Procedures (Signed)
Pre procedural Dx: Right sided paraspinal abscess Post procedural Dx: Same  Technically successful CT guided placed of a 10 Fr drainage catheter placement into the right sided paraspinal abscess yielding 10 cc of bloody purulent fluid.    A representative aspirated sample was capped and sent to the laboratory for analysis.    EBL: Trace Complications: None immediate  Ronny Bacon, MD Pager #: [phone]

## 2019-08-25 NOTE — Progress Notes (Addendum)
PROGRESS NOTE    Theresa Mills   ZOX:[id number]  DOB: 09/17/1950  PCP: Ronnell Freshwater, NP    DOA: 08/19/2019 LOS: 6   Brief Narrative   Theresa Mills is a 69 y.o. female with medical history of DM, GERD, anxiety, OSA, Parkinson's with dementia, hypothyroidism, frequent falls, brought in 08/19/19 by EMS after SNF staff called due to shortness of breath and altered mental status.  Patient complained of abdominal pain and neck pain but could not provide additional history due to dementia and altered mental status.  In the ED, patient was lethargic, afebrile, hypotensive with BP 83/41, HR 90, RR 24, O2 sat 93% on room air.  Labs were notable for leukocytosis 18k, lactic acidosis 2.5.  UA showed pyuria.  Creatinine was 2.43 (baseline 0.69), procalcitonin 4.62, troponin 28.  CT abdomen pelvis showed distended urinary bladder with bilateral hydronephrosis and findings concerning for pyelonephritis, several nonobstructing left renal calculi.  No focal consolidation was seen on CT chest.  EKG was normal sinus rhythm.  Patient's blood pressure improved with IV fluid bolus.  Started on IV antibiotics and admitted to hospitalist service for further evaluation and management.     Assessment & Plan   Principal Problem:   Osteomyelitis of lumbar vertebra (HCC) Active Problems:   Severe sepsis (HCC)   Bilateral hydronephrosis   Acute pyelonephritis   Acute urinary retention   Lumbar compression fracture (HCC)   Lumbar discitis   Diabetes mellitus type 2, uncomplicated (HCC)   Pressure injury of coccygeal region, stage 2 (HCC)   Acute metabolic encephalopathy   AKI (acute kidney injury) ([place])   Malnutrition of moderate degree   Dementia due to Parkinson's disease without behavioral disturbance ([place])   Essential hypertension   Acquired hypothyroidism   Lumbar Osteomyelitis / Discitis / Epidural Abscess / Right psoas abscess - obtained MRI lumbar spine due to ongoing urinary retention, no  sign of cauda equina syndrome, but appears to have extensive infection involving levels L3-L4.  ID consulted.  Will get IR to attempt image-guided aspiration of psoas abscess for cultures.  Clinda was started overnight, discontinued this AM while awaiting culture results.  Monitor clinically.  ESR/CRP with AM labs. --Flush percutaneous drain with 10 cc sterile saline daily.  Severe sepsis secondary to acute pyelonephritis and bacteremia - present on admission with leukocytosis, tachycardia, hypotension that was fluid responsive, lactic acidosis and AKI.  Imaging findings and UA are consistent with pyelonephritis.   Sepsis physiology has improved and lactic acidosis resolved.  Blood cultures grew Proteus and Enterobacteriaceae spp.  Transition to ampicillin today, amoxicillin on discharge.  Day 5 of antibiotics.     Bilateral hydronephrosis secondary to acute urinary retention -present on admission, CT abdomen showed distended bladder and bilateral hydronephrosis.  Foley catheter removed this AM for voiding trial.  Foley replaced 8/1 (almost 800 cc on bladder scan).  Flomax started 7/29.  Urology consulted.  MRI lumbar ruled out cauda equina syndrome.    Acute kidney injury - Resolved with Foley and IV fluids.  Present on admission with creatinine 2.43, prior baseline 0.69.  Likely multifactorial with sepsis and hypotension causing prerenal azotemia and post renal secondary to hydronephrosis and acute urinary retention.   Improved with Foley and IVF's.  Foley d/c'd for voiding trial, but replaced on 8/1 due to persistent retention.   Daily BMP to monitor.  Avoid nephrotoxins and hypotension.    Lumbar compression fractures- present on admission, seen on CT abdomen/pelvis in the ED,  showed compression fractures of the superior endplate of L4 and inferior endplate of L3, likely acute.  Patient has not complained of back pain throughout admission.  MRI obtained, showed infection as above.  No neurosurgery  consult for now given infection at that level.    Acute metabolic encephalopathy - Present on admission likely secondary to infection on top of baseline dementia.  Resolved with treatment of infection.    Transaminitis -present on admission, improving.  AST 271>>103, ALT 24>>64, alk phos 137>>94, normal total bili 1.2>>1.0.  RUQ showed liver echogenicity indicative of fatty infiltration, no focal liver lesions, gallbladder absent..  Suspect due to severe sepsis and shock liver.  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  Continue Sinemet, ropinirole  Essential hypertension -not on antihypertensives outpatient  Acquired hypothyroidism -continue levothyroxine  Hyperlipidemia -continue simvastatin  Depression/anxiety -continue Zoloft, mirtazapine, Celexa, and Xanax  Insomnia -continue melatonin   Pressure Injury -   Pressure Injury 08/20/19 Coccyx Medial Stage 2 -  Partial thickness loss of dermis presenting as a shallow open injury with a red, pink wound bed without slough. (Active)  08/20/19 0500  Location: Coccyx  Location Orientation: Medial  Staging: Stage 2 -  Partial thickness loss of dermis presenting as a shallow open injury with a red, pink wound bed without slough.  Wound Description (Comments):   Present on Admission: Yes      Patient BMI: Body mass index is 28.15 kg/m.   DVT prophylaxis:    Diet:  Diet Orders (From admission, onward)    Start     Ordered   08/25/19 0729  Diet NPO time specified Except for: Sips with Meds  Diet effective now       Question:  Except for  Answer:  Ferrel Logan with Meds   08/25/19 0728            Code Status: Full Code    Subjective 08/25/19    Patient seen at bedside this morning.  No acute events reported overnight.  She says she has had some hot and cold spells.  Continues to report being extremely tired.  Says she wants to go home but realizes  she needs to stay and is agreeable.  Discussed MRI findings and plan for biopsy.   Disposition Plan & Communication   Status is: Inpatient  Remains inpatient appropriate because:IV treatments appropriate due to intensity of illness or inability to take PO.  Continues to have acute urinary retention after voiding trial.  Urology consult pending.  Spinal infection with cultures pending.  Dispo: The patient is from: SNF              Anticipated d/c is to: SNF              Anticipated d/c date is: 1-2 days              Patient currently is not medically stable to d/c.   Family Communication: Spoke with friend, Vaughan Basta, by phone this afternoon at patient's request.  She and patient's sister do not want her to return to [facility], feel she has not been well cared for there.  Has been to [facility] in the past, and had positive experience there.  Vaughan Basta says patient was ambulatory prior to her admission that led to d/c to rehab at [facility].   Consults, Procedures, Significant Events   Consultants:   None  Procedures:   Foley placed in  the ER 7/27, discontinued 7/30 for void trial  CT guided drain catheter placed in right psoas abscess, aspirated for culture  Antimicrobials:   Rocephin 7/27 >> 7/30  Ampicillin 7/30 >>      Objective   Vitals:   08/25/19 1235 08/25/19 1304 08/25/19 1317 08/25/19 1553  BP: (!) 103/51 102/67 (!) 103/51   Pulse: 66   75  Resp: 15 16 16    Temp:    98 F (36.7 C)  TempSrc:    Oral  SpO2: 97% 91% 91% 100%  Weight:      Height:        Intake/Output Summary (Last 24 hours) at 08/25/2019 1742 Last data filed at 08/25/2019 1559 Gross per 24 hour  Intake 150 ml  Output 2400 ml  Net -2250 ml   Filed Weights   08/22/19 0448 08/23/19 0300 08/25/19 0437  Weight: 73.9 kg 74.6 kg 74.4 kg    Physical Exam:  General exam: Sleeping, wakes to voice, no acute distress, obese HEENT: Poor dentition, hearing grossly normal Respiratory system: CTAB,  respiratory effort. Cardiovascular system: normal S1/S2, RRR, no pitting edema.  Neurologic: No gross focal deficits, normal speech, A&O x4 Extremities: moves all, no cyanosis, normal tone Psychiatry: normal mood, flat affect  Labs   Data Reviewed: I have personally reviewed following labs and imaging studies  CBC: Recent Labs  Lab 08/21/19 0548 08/22/19 0608 08/23/19 0545 08/24/19 1035 08/25/19 0910  WBC 16.0* 17.4* 19.1* 13.4* 11.3*  NEUTROABS 14.3* 14.8* 16.6* 10.8* 9.0*  HGB 11.0* 10.5* 11.1* 10.5* 10.0*  HCT 31.2* 30.7* 33.8* 30.9* 30.3*  MCV 91.5 91.4 94.7 92.2 93.8  PLT 242 251 264 269 695   Basic Metabolic Panel: Recent Labs  Lab 08/21/19 0548 08/22/19 0608 08/23/19 0545 08/24/19 1035 08/25/19 0910  NA 140 142 139 136 136  K 4.3 4.2 4.3 4.1 3.9  CL 114* 116* 108 107 107  CO2 18* 19* 21* 20* 23  GLUCOSE 117* 108* 110* 135* 110*  BUN 48* 33* 40* 31* 25*  CREATININE 1.09* 0.84 1.21* 0.76 0.65  CALCIUM 7.7* 8.0* 8.1* 7.8* 7.6*  MG 1.8  --   --  1.6* 1.7   GFR: Estimated Creatinine Clearance: 64.7 mL/min (by C-G formula based on SCr of 0.65 mg/dL). Liver Function Tests: Recent Labs  Lab 08/19/19 1858 08/21/19 0548 08/24/19 1035  AST 271* 103* 101*  ALT 24 64* 20  ALKPHOS 137* 94 93  BILITOT 1.2 1.0 0.8  PROT 7.8 6.2* 6.5  ALBUMIN 2.6* 1.8* 1.8*   No results for input(s): LIPASE, AMYLASE in the last 168 hours. No results for input(s): AMMONIA in the last 168 hours. Coagulation Profile: Recent Labs  Lab 08/20/19 0420  INR 1.3*   Cardiac Enzymes: Recent Labs  Lab 08/19/19 1858  CKTOTAL 22*   BNP (last 3 results) No results for input(s): PROBNP in the last 8760 hours. HbA1C: No results for input(s): HGBA1C in the last 72 hours. CBG: Recent Labs  Lab 08/24/19 1148 08/24/19 1613 08/24/19 2105 08/25/19 0727 08/25/19 1555  GLUCAP 131* 142* 143* 107* 153*   Lipid Profile: No results for input(s): CHOL, HDL, LDLCALC, TRIG, CHOLHDL,  LDLDIRECT in the last 72 hours. Thyroid Function Tests: No results for input(s): TSH, T4TOTAL, FREET4, T3FREE, THYROIDAB in the last 72 hours. Anemia Panel: No results for input(s): VITAMINB12, FOLATE, FERRITIN, TIBC, IRON, RETICCTPCT in the last 72 hours. Sepsis Labs: Recent Labs  Lab 08/19/19 1858 08/20/19 0420  PROCALCITON 4.62 3.30  LATICACIDVEN 2.5* 1.9    Recent Results (from the past 240 hour(s))  Blood Culture (routine x 2)     Status: Abnormal   Collection Time: 08/19/19  6:59 PM   Specimen: BLOOD  Result Value Ref Range Status   Specimen Description   Final    BLOOD RIGHT ANTECUBITAL Performed at [facility], [address] [place], [place] [address]    Special Requests   Final    BOTTLES DRAWN AEROBIC AND ANAEROBIC Blood Culture adequate volume Performed at [facility], [address], [place], [place] [address]    Culture  Setup Time   Final    GRAM NEGATIVE RODS IN BOTH AEROBIC AND ANAEROBIC BOTTLES CRITICAL VALUE NOTED.  VALUE IS CONSISTENT WITH PREVIOUSLY REPORTED AND CALLED VALUE. Performed at [facility], [address] [place], [place] [address]    Culture (A)  Final    PROTEUS MIRABILIS SUSCEPTIBILITIES PERFORMED ON PREVIOUS CULTURE WITHIN THE LAST 5 DAYS. Performed at [facility], [place] [address] [place], [place] [address]    Report Status 08/22/2019 FINAL  Final  Blood Culture (routine x 2)     Status: Abnormal   Collection Time: 08/19/19  7:03 PM   Specimen: BLOOD  Result Value Ref Range Status   Specimen Description   Final    BLOOD LEFT ANTECUBITAL Performed at [facility], [address], [place], [place] [address]    Special Requests   Final    BOTTLES DRAWN AEROBIC AND ANAEROBIC Blood Culture adequate volume Performed at [facility], [address], [place], [place] [address]    Culture  Setup Time   Final    GRAM NEGATIVE RODS IN BOTH AEROBIC AND ANAEROBIC BOTTLES CRITICAL  RESULT CALLED TO, READ BACK BY AND VERIFIED WITH: MYRA SLAUGHTER AT 0824 ON 08/20/2019 [place]. Performed at [facility], [place] [address], [place], [place] [address]    Culture PROTEUS MIRABILIS (A)  Final   Report Status 08/22/2019 FINAL  Final   Organism ID, Bacteria PROTEUS MIRABILIS  Final      Susceptibility   Proteus mirabilis - MIC*    AMPICILLIN <=2 SENSITIVE Sensitive     CEFAZOLIN <=4 SENSITIVE Sensitive     CEFEPIME <=0.12 SENSITIVE Sensitive     CEFTAZIDIME <=1 SENSITIVE Sensitive     CEFTRIAXONE <=0.25 SENSITIVE Sensitive     CIPROFLOXACIN 0.5 SENSITIVE Sensitive     GENTAMICIN <=1 SENSITIVE Sensitive     IMIPENEM 2 SENSITIVE Sensitive     TRIMETH/SULFA <=20 SENSITIVE Sensitive     AMPICILLIN/SULBACTAM <=2 SENSITIVE Sensitive     PIP/TAZO <=4 SENSITIVE Sensitive     * PROTEUS MIRABILIS  Blood Culture ID Panel (Reflexed)     Status: Abnormal   Collection Time: 08/19/19  7:03 PM  Result Value Ref Range Status   Enterococcus species NOT DETECTED NOT DETECTED Final   Listeria monocytogenes NOT DETECTED NOT DETECTED Final   Staphylococcus species NOT DETECTED NOT DETECTED Final   Staphylococcus aureus (BCID) NOT DETECTED NOT DETECTED Final   Streptococcus species NOT DETECTED NOT DETECTED Final   Streptococcus agalactiae NOT DETECTED NOT DETECTED Final   Streptococcus pneumoniae NOT DETECTED NOT DETECTED Final   Streptococcus pyogenes NOT DETECTED NOT DETECTED Final   Acinetobacter baumannii NOT DETECTED NOT DETECTED Final   Enterobacteriaceae species DETECTED (A) NOT DETECTED Final    Comment: Enterobacteriaceae represent a large family of gram-negative bacteria, not a single organism. CRITICAL RESULT CALLED TO,  READ BACK BY AND VERIFIED WITH: MYRA SLAUGHTER AT 2458 ON 08/20/2019 [place].    Enterobacter cloacae complex NOT DETECTED NOT DETECTED Final   Escherichia coli NOT DETECTED NOT DETECTED Final   Klebsiella oxytoca NOT DETECTED NOT DETECTED Final   Klebsiella  pneumoniae NOT DETECTED NOT DETECTED Final   Proteus species DETECTED (A) NOT DETECTED Final    Comment: CRITICAL RESULT CALLED TO, READ BACK BY AND VERIFIED WITH: MYRA SLAUGHTER AT 0824 ON 08/20/2019 [place].    Serratia marcescens NOT DETECTED NOT DETECTED Final   Carbapenem resistance NOT DETECTED NOT DETECTED Final   Haemophilus influenzae NOT DETECTED NOT DETECTED Final   Neisseria meningitidis NOT DETECTED NOT DETECTED Final   Pseudomonas aeruginosa NOT DETECTED NOT DETECTED Final   Candida albicans NOT DETECTED NOT DETECTED Final   Candida glabrata NOT DETECTED NOT DETECTED Final   Candida krusei NOT DETECTED NOT DETECTED Final   Candida parapsilosis NOT DETECTED NOT DETECTED Final   Candida tropicalis NOT DETECTED NOT DETECTED Final    Comment: Performed at [facility], [address] [place], [place] [address]  Urine culture     Status: Abnormal   Collection Time: 08/19/19  9:15 PM   Specimen: In/Out Cath Urine  Result Value Ref Range Status   Specimen Description   Final    IN/OUT CATH URINE Performed at [facility], [address] [place], [place] [address]    Special Requests   Final    NONE Performed at [facility], [place], [place] [address]    Culture >=100,000 COLONIES/mL PROTEUS MIRABILIS (A)  Final   Report Status 08/22/2019 FINAL  Final   Organism ID, Bacteria PROTEUS MIRABILIS (A)  Final      Susceptibility   Proteus mirabilis - MIC*    AMPICILLIN <=2 SENSITIVE Sensitive     CEFAZOLIN <=4 SENSITIVE Sensitive     CEFTRIAXONE <=0.25 SENSITIVE Sensitive     CIPROFLOXACIN <=0.25 SENSITIVE Sensitive     GENTAMICIN <=1 SENSITIVE Sensitive     IMIPENEM 2 SENSITIVE Sensitive     NITROFURANTOIN 128 RESISTANT Resistant     TRIMETH/SULFA <=20 SENSITIVE Sensitive     AMPICILLIN/SULBACTAM <=2 SENSITIVE Sensitive     PIP/TAZO <=4 SENSITIVE Sensitive     * >=100,000 COLONIES/mL PROTEUS MIRABILIS  SARS Coronavirus 2 by RT  PCR (hospital order, performed in [place] hospital lab) Nasopharyngeal Nasopharyngeal Swab     Status: None   Collection Time: 08/19/19  9:15 PM   Specimen: Nasopharyngeal Swab  Result Value Ref Range Status   SARS Coronavirus 2 NEGATIVE NEGATIVE Final    Comment: (NOTE) SARS-CoV-2 target nucleic acids are NOT DETECTED.  The SARS-CoV-2 RNA is generally detectable in upper and lower respiratory specimens during the acute phase of infection. The lowest concentration of SARS-CoV-2 viral copies this assay can detect is 250 copies / mL. A negative result does not preclude SARS-CoV-2 infection and should not be used as the sole basis for treatment or other patient management decisions.  A negative result may occur with improper specimen collection / handling, submission of specimen other than nasopharyngeal swab, presence of viral mutation(s) within the areas targeted by this assay, and inadequate number of viral copies (<250 copies / mL). A negative result must be combined with clinical observations, patient history, and epidemiological information.  Fact Sheet for Patients:   StrictlyIdeas.no  Fact Sheet for Healthcare Providers: BankingDealers.co.za  This test is not yet approved or  cleared by the  [place] Architectural technologist and has been authorized for detection and/or diagnosis of SARS-CoV-2 by FDA under an Print production planner (EUA).  This EUA will remain in effect (meaning this test can be used) for the duration of the COVID-19 declaration under Section 564(b)(1) of the Act, 21 U.S.C. section 360bbb-3(b)(1), unless the authorization is terminated or revoked sooner.  Performed at [facility], [address] [place], [place] [address]   MRSA PCR Screening     Status: None   Collection Time: 08/20/19  5:55 AM   Specimen: Nasopharyngeal  Result Value Ref Range Status   MRSA by PCR NEGATIVE NEGATIVE Final    Comment:         The GeneXpert MRSA Assay (FDA approved for NASAL specimens only), is one component of a comprehensive MRSA colonization surveillance program. It is not intended to diagnose MRSA infection nor to guide or monitor treatment for MRSA infections. Performed at [facility], [address] [place], [place] [address]   CULTURE, BLOOD (ROUTINE X 2) w Reflex to ID Panel     Status: None (Preliminary result)   Collection Time: 08/23/19 10:33 AM   Specimen: BLOOD  Result Value Ref Range Status   Specimen Description BLOOD LAC  Final   Special Requests   Final    BOTTLES DRAWN AEROBIC AND ANAEROBIC Blood Culture adequate volume   Culture   Final    NO GROWTH 2 DAYS Performed at [facility], [address] [place], [place] [address]    Report Status PENDING  Incomplete  CULTURE, BLOOD (ROUTINE X 2) w Reflex to ID Panel     Status: None (Preliminary result)   Collection Time: 08/23/19 10:34 AM   Specimen: BLOOD  Result Value Ref Range Status   Specimen Description BLOOD LFA  Final   Special Requests   Final    BOTTLES DRAWN AEROBIC AND ANAEROBIC Blood Culture adequate volume   Culture   Final    NO GROWTH 2 DAYS Performed at [facility], [address] [place], [place] [address]    Report Status PENDING  Incomplete      Imaging Studies   MR LUMBAR SPINE WO CONTRAST  Result Date: 08/24/2019 CLINICAL DATA:  Initial evaluation for compression fracture. EXAM: MRI LUMBAR SPINE WITHOUT CONTRAST TECHNIQUE: Multiplanar, multisequence MR imaging of the lumbar spine was performed. No intravenous contrast was administered. COMPARISON:  Prior CT from 08/19/2019. FINDINGS: Segmentation: Standard. Lowest well-formed disc space labeled the L5-S1 level. Alignment: Moderate levoscoliosis with apex at L2. Chronic bilateral pars defects at L5 with associated 8 mm spondylolisthesis of L5 on S1. Trace 2 mm retrolisthesis of L1 on L2 and L2 on L3. Vertebrae: There is  abnormal fluid signal intensity within the L3-4 interspace with associated marrow edema within the adjacent endplates of L3 and L4. Associated paraspinous edema within the adjacent psoas musculature. Findings concerning for possible osteomyelitis discitis. Previously identified superimposed compression fracture at the superior endplate of L4 again seen, associated height loss measures up to 40% with 3 mm bony retropulsion. Additional acute to subacute compression fracture extending through the inferior endplate of L3 with mild 25% central height loss and trace 2 mm bony retropulsion. Mild height loss at the superior endplate of Q68 is chronic in appearance. Vertebral body height otherwise maintained. Additionally, there is abnormal marrow edema about the right greater than left L3-4 facets with associated small joint effusions and adjacent paraspinous edema. While these findings could be degenerative, associated septic arthritis  could also be present. Evidence for epidural collection involving the ventral and right posterolateral epidural space at the level of L3-4, most likely epidural abscess (series 15, images 24, 27). Additionally, fluid signal intensity seen within the L2-3 interspace, favored to be degenerative, although possible discitis at this level not excluded. Additional more mild fluid signal intensity noted superiorly within the L1-2 interspace as well, favored to be degenerative. Underlying bone marrow signal intensity otherwise within normal limits. Multiple scattered benign hemangiomata noted. No worrisome osseous lesions. Conus medullaris and cauda equina: Conus extends to the L2 level. Conus and cauda equina appear normal. Paraspinal and other soft tissues: Abnormal edema seen within the right greater than left psoas musculature bilaterally. Superimposed loculated collection within the right psoas muscle measures 1.7 x 1.7 x 4.9 cm (series 15, image 31), most likely reflecting abscess. Scattered  fat stranding noted within the adjacent retroperitoneum. Soft tissue edema also noted within the posterior paraspinous musculature, most pronounced about the L3-4 facets. Partially visualized visceral structures grossly unremarkable. Disc levels: T12-L1: Mild left eccentric disc bulge. Superimposed right extraforaminal disc protrusion (series 15, image 8). Mild facet hypertrophy. No significant stenosis. L1-2: Diffuse disc bulge with disc desiccation and intervertebral disc space narrowing. Associated reactive endplate changes with marginal endplate spurring superimposed more focal right extraforaminal disc osteophyte complex (series 15, image 15). Mild to moderate facet hypertrophy, greater on the left. No significant spinal stenosis. Foramina remain patent. L2-3: Advanced intervertebral disc space narrowing with diffuse disc bulge and disc desiccation. Disc bulging asymmetric to the right with exuberant right-sided reactive endplate spurring. Fluid signal intensity within the central and right aspect of the disc space favored to be degenerative, although changes related to discitis not entirely excluded. Moderate bilateral facet hypertrophy. Small amount of epidural abscess seen at the right posterior epidural space, tracking inferiorly from the L3-4 level (series 15, image 22). Resultant moderate spinal stenosis with right greater than left lateral recess narrowing. Mild bilateral L2 foraminal stenosis. L3-4: Findings concerning for osteomyelitis discitis with adjacent endplate compression fractures. Superimposed mild disc bulge and/or epidural abscess within the ventral epidural space. Epidural abscess extends to involve the right posterior aspect of the thecal sac. Superimposed moderate facet hypertrophy with associated joint effusions and probable septic arthritis. Resultant moderate to severe spinal stenosis. Thecal sac measures 8 mm in AP diameter at its most narrow point. Moderate right greater than left L3  foraminal narrowing. L4-5: Disc desiccation with minimal disc bulge. Superimposed shallow left extraforaminal disc protrusion. Moderate facet hypertrophy. No spinal stenosis. Foramina remain patent. L5-S1: Chronic bilateral pars defects with associated 8 mm spondylolisthesis. Moderate right worse than left facet hypertrophy. No spinal stenosis. Mild right with moderate to severe left L5 foraminal stenosis. IMPRESSION: 1. Findings concerning for osteomyelitis discitis involving the L3-4 interspace as above. Associated epidural abscess at the L3-4 level with resultant moderate to severe spinal stenosis. 2. Abnormal edema with joint effusions involving the bilateral L3-4 facets, concerning for concomitant septic arthritis. 3. Associated paraspinous inflammation centered about the L3-4 interspace with superimposed 1.7 x 1.7 x 4.9 cm collection within the right psoas muscle, likely abscess. 4. Superimposed acute to subacute compression deformities involving the inferior endplate of O1-30% height loss with 2 mm bony retropulsion, and superior endplate of Q6-57% height loss with 3 mm bony retropulsion. 5. Additional fluid signal intensity within the L1-2 and L2-3 interspaces, favored to be degenerative, although additional levels of discitis somewhat difficult to exclude. Attention at follow-up recommended. 6. Chronic bilateral pars defects  at L5-S1 with associated 8 mm spondylolisthesis of L5 on S1 with moderate to severe left L5 foraminal stenosis. 7. Underlying moderate levoscoliosis with additional moderate multilevel degenerative spondylosis elsewhere as above. Results were called by telephone at the time of interpretation on 08/24/2019 at 10:49 pm to provider Sharion Settler, NP, who verbally acknowledged these results. Electronically Signed   By: Jeannine Boga M.D.   On: 08/24/2019 22:51   CT BIOPSY  Result Date: 08/25/2019 INDICATION: Right-sided iliopsoas abscess. Please perform CT-guided aspiration and/or  drainage catheter placement for diagnostic and therapeutic purposes. EXAM: CT-GUIDED RIGHT ILIOPSOAS ABSCESS DRAINAGE CATHETER PLACEMENT COMPARISON:  Lumbar spine MRI - 08/24/2019 MEDICATIONS: The patient is currently admitted to the hospital and receiving intravenous antibiotics. The antibiotics were administered within an appropriate time frame prior to the initiation of the procedure. ANESTHESIA/SEDATION: Moderate (conscious) sedation was employed during this procedure. A total of Versed 0.5 mg and Fentanyl 25 mcg was administered intravenously. Moderate Sedation Time: 15 minutes. The patient's level of consciousness and vital signs were monitored continuously by radiology nursing throughout the procedure under my direct supervision. CONTRAST:  None COMPLICATIONS: None immediate. PROCEDURE: Informed written consent was obtained from the patient after a discussion of the risks, benefits and alternatives to treatment. The patient was placed left lateral decubitus on the CT gantry and a pre procedural CT was performed re-demonstrating the known ill-defined abscess/fluid collection within the right iliopsoas musculature with dominant component measuring approximately 2.1 x 1.7 cm (image 28, series 2). The procedure was planned. A timeout was performed prior to the initiation of the procedure. The skin overlying the posterior aspect the right lower back was prepped and draped in the usual sterile fashion. The overlying soft tissues were anesthetized with 1% lidocaine with epinephrine. Appropriate trajectory was planned with the use of a 22 gauge spinal needle. An 18 gauge trocar needle was advanced into the abscess/fluid collection and a short Amplatz super stiff wire was coiled within the collection. Appropriate positioning was confirmed with a limited CT scan. The tract was serially dilated allowing placement of a 10 [place] all-purpose drainage catheter. Appropriate positioning was confirmed with a limited  postprocedural CT scan. Approximately 10 ml of bloody, purulent fluid was aspirated. The tube was connected to a JP bulb and sutured in place. A dressing was placed. The patient tolerated the procedure well without immediate post procedural complication. IMPRESSION: Successful CT guided placement of a 10 French all purpose drain catheter into the right iliopsoas abscess with aspiration of 10 mL of bloody, purulent fluid. Samples were sent to the laboratory as requested by the ordering clinical team. PLAN: - flush the percutaneous drainage catheter with 10 cc normal saline b.i.d. - Maintain diligent records regarding daily drainage catheter output (excluding flush volumes). - Once drainage catheter output is less than 10 cc per day the drainage catheter may be removed with or without obtaining contrast-enhanced lumbar spine CT prior to removal. Note, drainage catheter does NOT require injection prior to removal. Electronically Signed   By: Sandi Mariscal M.D.   On: 08/25/2019 14:16     Medications   Scheduled Meds: . ALPRAZolam  0.25 mg Oral q morning - 10a  . calcium-vitamin D  1 tablet Oral Daily  . carbidopa-levodopa  1 tablet Oral BID  . Chlorhexidine Gluconate Cloth  6 each Topical Q0600  . citalopram  40 mg Oral Daily  . docusate sodium  100 mg Oral BID  . feeding supplement (ENSURE ENLIVE)  237 mL Oral TID  BM  . fentaNYL      . insulin aspart  0-15 Units Subcutaneous TID WC  . insulin aspart  0-5 Units Subcutaneous QHS  . levothyroxine  175 mcg Oral Q0600  . midazolam      . mirtazapine  7.5 mg Oral QHS  . multivitamin with minerals  1 tablet Oral Daily  . multivitamin with minerals  1 tablet Oral Daily  . pantoprazole  40 mg Oral Daily  . rOPINIRole  0.5 mg Oral BID  . rOPINIRole  1 mg Oral QHS  . sertraline  50 mg Oral Daily  . simvastatin  40 mg Oral QHS  . tamsulosin  0.4 mg Oral QPC supper   Continuous Infusions: .  ceFAZolin (ANCEF) IV 2 g (08/25/19 0515)       LOS: 6 days     Time spent: 45 minutes with greater than 50% spent in coordination of care and direct patient contact.    Ezekiel Slocumb, DO [facility]  08/25/2019, 5:42 PM    If 7PM-7AM, please contact night-coverage. How to contact the [facility] Attending or Consulting provider Needles or covering provider during after hours [place], for this patient?    1. Check the care team in [facility] and look for a) attending/consulting [facility] provider listed and b) the [facility] team listed 2. Log into [email or web address] and use [place]'s universal password to access. If you do not have the password, please contact the hospital operator. 3. Locate the [facility] provider you are looking for under [facility] and page to a number that you can be directly reached. 4. If you still have difficulty reaching the provider, please page the [facility] (Director on Call) for the Hospitalists listed on amion for assistance.

## 2019-08-25 NOTE — OR Nursing (Signed)
Pt returned to 256 via bed. Care report given to inpt nurse and site assessed.

## 2019-08-25 NOTE — Care Management Important Message (Signed)
Important Message  Patient Details  Name: Theresa Mills MRN: [id number] Date of Birth: 1950-01-26   Medicare Important Message Given:  Yes     [name] A Rollins Wrightson 08/25/2019, 2:47 PM

## 2019-08-25 NOTE — Sedation Documentation (Signed)
Total conscious sedation : Versed 0.5 mg IV, Fentanyl 25 mcg IV. Pt. Tolerated procedure well.

## 2019-08-25 NOTE — Consult Note (Signed)
NAME: RINI MOFFIT  DOB: 1950/02/11  MRN: [id number]  Date/Time: 08/25/2019 12:29 PM  REQUESTING PROVIDER: Dr.Griffith Subjective:  REASON FOR CONSULT: discitis, proteus bacteremia ?pt is a limited historian, chart reviewed UCHENNA SEUFERT is a 69 y.o.female  with a history ofDiabetes mellitus, Parkinson's disease, hypothyroidism, GERD, anxiety, who lives at home with home health was recently in the hospital between 08/05/19 until 08/09/2019 for multiple falls at home.  She had acute rhabdomyolysis.  She was discharged to nursing home. She presented from [place] care on 08/19/2019 with shortness of breath and altered mental status.  She was also complaining of abdominal pain and neck pain.  In the ED she was lethargic, blood pressure of 83/41, heart rate of 90, sats of 93% on room air.  WBC was 18,000, lactate of 2.5, creatinine was 2.43 with a baseline of 0.69.  Pro-Cal was 4.62.  CT of the head and cervical spine showed no acute injury.  CT abdomen and pelvis showed distended urinary bladder with bilateral hydronephrosis and findings concerning for pyelonephritis.  There were several nonobstructing left renal calculi.  She was given IV fluids and started on IV ceftriaxone after sending blood cultures.  A Foley catheter was placed.  Blood culture and urine culture grew Proteus which was pansensitive.  She received an MRI lumbar spine to rule out cauda equina syndrome.  MRI was abnormal and it showed abnormal fluid signal intensity within the L3-L4 interspace with associated marrow edema within the adjacent endplates of L3 and L4.  There was associated paraspinous edema within the adjacent psoas musculature.  This was concerning for possible osteomyelitis and discitis.  There was also a previously identified compression fracture at the superior endplate of L4 which was seen again.  There was subacute compression fracture extending through the inferior endplate of L3.  There was also marrow edema around  the right greater than left L3-L4 facets.  All this was concerning for osteomyelitis discitis involving L3 and L4, associated epidural abscess at the L3-L4 level with resultant moderate to severe spinal stenosis.  There was abnormal edema with joint effusions involving the bilateral L3-L4 facets concerning for septic arthritis.  There was also associated paraspinous inflammation centered around the L3-L4 interspace with 1.7 into 1.7 to 4.9 cm collection within the right psoas muscle. I am seeing the patient for the same. Past Medical History:  Diagnosis Date  . Anxiety   . Diabetes ([place])   . Difficult intravenous access   . GERD (gastroesophageal reflux disease)   . HBP (high blood pressure)   . Hyperlipidemia   . Osteoarthritis   . Parkinson disease ([place])   . Sleep apnea   . Thyroid disease     Past Surgical History:  Procedure Laterality Date  . ABDOMINAL HYSTERECTOMY    . CATARACT EXTRACTION Bilateral   . CHOLECYSTECTOMY    . COLONOSCOPY WITH PROPOFOL N/A 04/02/2015   Procedure: COLONOSCOPY WITH PROPOFOL;  Surgeon: Josefine Class, MD;  Location: [facility] ENDOSCOPY;  Service: Endoscopy;  Laterality: N/A;  . ESOPHAGOGASTRODUODENOSCOPY (EGD) WITH PROPOFOL N/A 04/02/2015   Procedure: ESOPHAGOGASTRODUODENOSCOPY (EGD) WITH PROPOFOL;  Surgeon: Josefine Class, MD;  Location[facility] END[facility]COPY;  Service: Endoscopy;  Laterality: N/A;  . GALLBLADDER SURGERY    . LAPAROSCOPIC HYSTERECTOMY    . THYROIDECTOMY Left   . TONSILLECTOMY    . TONSILLECTOMY Bilateral     Social History   Socioeconomic History  . Marital status: Widowed    Spouse name: Not on file  .  Number of children: Not on file  . Years of education: Not on file  . Highest education level: Not on file  Occupational History  . Not on file  Tobacco Use  . Smoking status: Never Smoker  . Smokeless tobacco: Never Used  Substance and Sexual Activity  . Alcohol use: No  . Drug use: No  . Sexual activity: Not on file  Other  Topics Concern  . Not on file  Social History Narrative  . Not on file   Social Determinants of Health   Financial Resource Strain:   . Difficulty of Paying Living Expenses:   Food Insecurity:   . Worried About Charity fundraiser in the Last Year:   . Arboriculturist in the Last Year:   Transportation Needs:   . Film/video editor (Medical):   Marland Kitchen Lack of Transportation (Non-Medical):   Physical Activity:   . Days of Exercise per Week:   . Minutes of Exercise per Session:   Stress:   . Feeling of Stress :   Social Connections:   . Frequency of Communication with Friends and Family:   . Frequency of Social Gatherings with Friends and Family:   . Attends Religious Services:   . Active Member of Clubs or Organizations:   . Attends Archivist Meetings:   Marland Kitchen Marital Status:   Intimate Partner Violence:   . Fear of Current or Ex-Partner:   . Emotionally Abused:   Marland Kitchen Physically Abused:   . Sexually Abused:     Family History  Problem Relation Age of Onset  . Asthma Father   . Cancer Sister   . Stroke Maternal Uncle   . Heart disease Maternal Uncle   . Diabetes Maternal Uncle    Allergies  Allergen Reactions  . Codeine Other (See Comments)    HALLUCINATIONS  . Ephadrene [Cholestatin] Other (See Comments)    HYPER AND NERVOUS  . Other Other (See Comments)    HYPER  HYPER AND NERVOUS  . Valium [Diazepam] Other (See Comments)    OVERLY SENSITIVE/TOO STRONG    ? Current Facility-Administered Medications  Medication Dose Route Frequency Provider Last Rate Last Admin  . acetaminophen (TYLENOL) tablet 650 mg  650 mg Oral Q6H PRN Athena Masse, MD   650 mg at 08/24/19 2200   Or  . acetaminophen (TYLENOL) suppository 650 mg  650 mg Rectal Q6H PRN Athena Masse, MD      . ALPRAZolam Duanne Moron) tablet 0.25 mg  0.25 mg Oral q morning - 10a Nicole Kindred A, DO   0.25 mg at 08/25/19 5701  . ALPRAZolam Duanne Moron) tablet 0.5 mg  0.5 mg Oral QHS PRN Nicole Kindred A,  DO   0.5 mg at 08/22/19 2003  . calcium-vitamin D (OSCAL WITH D) 500-200 MG-UNIT per tablet 1 tablet  1 tablet Oral Daily Nicole Kindred A, DO   1 tablet at 08/25/19 7793  . carbidopa-levodopa (SINEMET CR) 50-200 MG per tablet controlled release 1 tablet  1 tablet Oral BID Nicole Kindred A, DO   1 tablet at 08/25/19 9030  . ceFAZolin (ANCEF) IVPB 2g/100 mL premix  2 g Intravenous Q8H Sharion Settler, NP 200 mL/hr at 08/25/19 0515 2 g at 08/25/19 0515  . celecoxib (CELEBREX) capsule 200 mg  200 mg Oral BID PRN Nicole Kindred A, DO      . Chlorhexidine Gluconate Cloth 2 % PADS 6 each  6 each Topical Q0600 Nicole Kindred A, DO  6 each at 08/24/19 0545  . citalopram (CELEXA) tablet 40 mg  40 mg Oral Daily Nicole Kindred A, DO   40 mg at 08/25/19 8786  . docusate sodium (COLACE) capsule 100 mg  100 mg Oral BID Nicole Kindred A, DO   100 mg at 08/25/19 7672  . feeding supplement (ENSURE ENLIVE) (ENSURE ENLIVE) liquid 237 mL  237 mL Oral TID BM Nicole Kindred A, DO   237 mL at 08/24/19 2200  . fentaNYL (SUBLIMAZE) 100 MCG/2ML injection           . fentaNYL (SUBLIMAZE) injection   Intravenous PRN Sandi Mariscal, MD   25 mcg at 08/25/19 1220  . HYDROcodone-acetaminophen (NORCO/VICODIN) 5-325 MG per tablet 1-2 tablet  1-2 tablet Oral Q4H PRN Athena Masse, MD   1 tablet at 08/24/19 [phone]  . insulin aspart (novoLOG) injection 0-15 Units  0-15 Units Subcutaneous TID WC Athena Masse, MD   3 Units at 08/24/19 1412  . insulin aspart (novoLOG) injection 0-5 Units  0-5 Units Subcutaneous QHS Judd Gaudier V, MD      . levothyroxine (SYNTHROID) tablet 175 mcg  175 mcg Oral Q0600 Nicole Kindred A, DO   175 mcg at 08/25/19 0516  . melatonin tablet 5 mg  5 mg Oral QHS PRN Nicole Kindred A, DO   5 mg at 08/23/19 0402  . midazolam (VERSED) 5 MG/5ML injection           . midazolam (VERSED) 5 MG/5ML injection   Intravenous PRN Sandi Mariscal, MD   0.5 mg at 08/25/19 1220  . mirtazapine (REMERON) tablet 7.5 mg  7.5  mg Oral QHS Nicole Kindred A, DO   7.5 mg at 08/24/19 2159  . multivitamin with minerals tablet 1 tablet  1 tablet Oral Daily Nicole Kindred A, DO   1 tablet at 08/25/19 0962  . multivitamin with minerals tablet 1 tablet  1 tablet Oral Daily Nicole Kindred A, DO   1 tablet at 08/24/19 1002  . ondansetron (ZOFRAN) tablet 4 mg  4 mg Oral Q6H PRN Athena Masse, MD       Or  . ondansetron [facility]) injection 4 mg  4 mg Intravenous Q6H PRN Athena Masse, MD      . pantoprazole (PROTONIX) EC tablet 40 mg  40 mg Oral Daily Nicole Kindred A, DO   40 mg at 08/25/19 8366  . rOPINIRole (REQUIP) tablet 0.5 mg  0.5 mg Oral BID Nicole Kindred A, DO   0.5 mg at 08/25/19 2947  . rOPINIRole (REQUIP) tablet 1 mg  1 mg Oral QHS Nicole Kindred A, DO   1 mg at 08/24/19 2159  . sertraline (ZOLOFT) tablet 50 mg  50 mg Oral Daily Nicole Kindred A, DO   50 mg at 08/25/19 6546  . simvastatin (ZOCOR) tablet 40 mg  40 mg Oral QHS Nicole Kindred A, DO   40 mg at 08/24/19 2200  . tamsulosin (FLOMAX) capsule 0.4 mg  0.4 mg Oral QPC supper Nicole Kindred A, DO   0.4 mg at 08/24/19 2159     Abtx:  Anti-infectives (From admission, onward)   Start     Dose/Rate Route Frequency Ordered Stop   08/24/19 2315  clindamycin (CLEOCIN) IVPB 900 mg  Status:  Discontinued        900 mg 100 mL/hr over 30 Minutes Intravenous Every 8 hours 08/24/19 2312 08/25/19 0657   08/24/19 2315  ceFAZolin (ANCEF) IVPB 2g/100 mL premix  Discontinue     2 g 200 mL/hr over 30 Minutes Intravenous Every 8 hours 08/24/19 2312     08/23/19 0600  ampicillin (OMNIPEN) 2 g in sodium chloride 0.9 % 100 mL IVPB  Status:  Discontinued        2 g 300 mL/hr over 20 Minutes Intravenous Every 6 hours 08/22/19 1132 08/24/19 2312   08/20/19 2000  cefTRIAXone (ROCEPHIN) 1 g in sodium chloride 0.9 % 100 mL IVPB  Status:  Discontinued        1 g 200 mL/hr over 30 Minutes Intravenous Every 24 hours 08/19/19 2204 08/20/19 0850   08/20/19 1000  cefTRIAXone  (ROCEPHIN) 2 g in sodium chloride 0.9 % 100 mL IVPB  Status:  Discontinued        2 g 200 mL/hr over 30 Minutes Intravenous Every 24 hours 08/20/19 0850 08/22/19 1132   08/19/19 2030  cefTRIAXone (ROCEPHIN) 1 g in sodium chloride 0.9 % 100 mL IVPB        1 g 200 mL/hr over 30 Minutes Intravenous  Once 08/19/19 2020 08/19/19 2314      REVIEW OF SYSTEMS:  Const:no  fever, negative chills, negative weight loss Eyes: negative diplopia or visual changes, negative eye pain ENT: negative coryza, negative sore throat Resp: negative cough, hemoptysis, has dyspnea Cards: negative for chest pain, palpitations, lower extremity edema GU: some difficulty passing urine GI: abdominal pain, no diarrhea, bleeding, constipation Skin: negative for rash and pruritus Heme: negative for easy bruising and gum/nose bleeding MS: generalized weakness/falls Neurology, memory problems  Psych:  anxiety  Endocrine: negative for thyroid, diabetes Allergy/Immunology- as above? Objective:  VITALS:  BP (!) 99/51   Pulse 68   Temp 97.6 F (36.4 C) (Oral)   Resp 16   Ht 5' 4"  (1.626 m)   Wt 74.4 kg   SpO2 97%   BMI 28.15 kg/m  PHYSICAL EXAM:  General: lethargic pale, awake, responds to questions  Head: Normocephalic, without obvious abnormality, atraumatic. Eyes: Conjunctivae clear, anicteric sclerae. Pupils are equal ENT Nares normal. No drainage  Dry  normal. Poor dentition Neck: Supple, symmetrical, no adenopathy, thyroid: non tender no carotid bruit and no JVD. Back rt lower back drain. Lungs: b/l air entry Heart: s1s2 Abdomen: Soft, Extremities: atraumatic, no cyanosis. No edema. No clubbing Skin: No rashes or lesions. Or bruising Lymph: Cervical, supraclavicular normal. Neurologic: Grossly non-focal Pertinent Labs Lab Results CBC    Component Value Date/Time   WBC 11.3 (H) 08/25/2019 0910   RBC 3.23 (L) 08/25/2019 0910   HGB 10.0 (L) 08/25/2019 0910   HGB 15.9 01/09/2017 1041   HCT 30.3  (L) 08/25/2019 0910   HCT 49.8 (H) 01/09/2017 1041   PLT 277 08/25/2019 0910   PLT 237 01/09/2017 1041   MCV 93.8 08/25/2019 0910   MCV 97 01/09/2017 1041   MCV 94 01/12/2014 2110   MCH 31.0 08/25/2019 0910   MCHC 33.0 08/25/2019 0910   RDW 13.2 08/25/2019 0910   RDW 13.5 01/09/2017 1041   RDW 14.0 01/12/2014 2110   LYMPHSABS 1.4 08/25/2019 0910   LYMPHSABS 1.6 01/09/2017 1041   LYMPHSABS 2.0 01/12/2014 2110   MONOABS 0.5 08/25/2019 0910   MONOABS 0.6 01/12/2014 2110   EOSABS 0.1 08/25/2019 0910   EOSABS 0.1 01/09/2017 1041   EOSABS 0.1 01/12/2014 2110   BASOSABS 0.0 08/25/2019 0910   BASOSABS 0.0 01/09/2017 1041   BASOSABS 0.1 01/12/2014 2110    CMP Latest Ref Rng & Units 08/25/2019 08/24/2019  08/23/2019  Glucose 70 - 99 mg/dL 110(H) 135(H) 110(H)  BUN 8 - 23 mg/dL 25(H) 31(H) 40(H)  Creatinine 0.44 - 1.00 mg/dL 0.65 0.76 1.21(H)  Sodium 135 - 145 mmol/L 136 136 139  Potassium 3.5 - 5.1 mmol/L 3.9 4.1 4.3  Chloride 98 - 111 mmol/L 107 107 108  CO2 22 - 32 mmol/L 23 20(L) 21(L)  Calcium 8.9 - 10.3 mg/dL 7.6(L) 7.8(L) 8.1(L)  Total Protein 6.5 - 8.1 g/dL - 6.5 -  Total Bilirubin 0.3 - 1.2 mg/dL - 0.8 -  Alkaline Phos 38 - 126 U/L - 93 -  AST 15 - 41 U/L - 101(H) -  ALT 0 - 44 U/L - 20 -      Microbiology: Recent Results (from the past 240 hour(s))  Blood Culture (routine x 2)     Status: Abnormal   Collection Time: 08/19/19  6:59 PM   Specimen: BLOOD  Result Value Ref Range Status   Specimen Description   Final    BLOOD RIGHT ANTECUBITAL Performed at [facility], [address] [place], [place] [address]    Special Requests   Final    BOTTLES DRAWN AEROBIC AND ANAEROBIC Blood Culture adequate volume Performed at [facility], [address] [place], [place] [address]    Culture  Setup Time   Final    GRAM NEGATIVE RODS IN BOTH AEROBIC AND ANAEROBIC BOTTLES CRITICAL VALUE NOTED.  VALUE IS CONSISTENT WITH PREVIOUSLY REPORTED AND CALLED  VALUE. Performed at [facility], [address] [place], [place] [address]    Culture (A)  Final    PROTEUS MIRABILIS SUSCEPTIBILITIES PERFORMED ON PREVIOUS CULTURE WITHIN THE LAST 5 DAYS. Performed at [facility], [place] [address], [place], [place] [address]    Report Status 08/22/2019 FINAL  Final  Blood Culture (routine x 2)     Status: Abnormal   Collection Time: 08/19/19  7:03 PM   Specimen: BLOOD  Result Value Ref Range Status   Specimen Description   Final    BLOOD LEFT ANTECUBITAL Performed at [facility], [address] [place], [place] [address]    Special Requests   Final    BOTTLES DRAWN AEROBIC AND ANAEROBIC Blood Culture adequate volume Performed at [facility], [address], [place], [place] [address]    Culture  Setup Time   Final    GRAM NEGATIVE RODS IN BOTH AEROBIC AND ANAEROBIC BOTTLES CRITICAL RESULT CALLED TO, READ BACK BY AND VERIFIED WITH: MYRA SLAUGHTER AT 0824 ON 08/20/2019 [place]. Performed at [facility], [place] [address], [place],  [address]    Culture PROTEUS MIRABILIS (A)  Final   Report Status 08/22/2019 FINAL  Final   Organism ID, Bacteria PROTEUS MIRABILIS  Final      Susceptibility   Proteus mirabilis - MIC*    AMPICILLIN <=2 SENSITIVE Sensitive     CEFAZOLIN <=4 SENSITIVE Sensitive     CEFEPIME <=0.12 SENSITIVE Sensitive     CEFTAZIDIME <=1 SENSITIVE Sensitive     CEFTRIAXONE <=0.25 SENSITIVE Sensitive     CIPROFLOXACIN 0.5 SENSITIVE Sensitive     GENTAMICIN <=1 SENSITIVE Sensitive     IMIPENEM 2 SENSITIVE Sensitive     TRIMETH/SULFA <=20 SENSITIVE Sensitive     AMPICILLIN/SULBACTAM <=2 SENSITIVE Sensitive     PIP/TAZO <=4 SENSITIVE Sensitive     * PROTEUS MIRABILIS  Blood Culture ID Panel (Reflexed)     Status: Abnormal   Collection Time:  08/19/19  7:03 PM  Result Value Ref Range Status   Enterococcus species NOT DETECTED NOT DETECTED Final   Listeria monocytogenes NOT DETECTED NOT  DETECTED Final   Staphylococcus species NOT DETECTED NOT DETECTED Final   Staphylococcus aureus (BCID) NOT DETECTED NOT DETECTED Final   Streptococcus species NOT DETECTED NOT DETECTED Final   Streptococcus agalactiae NOT DETECTED NOT DETECTED Final   Streptococcus pneumoniae NOT DETECTED NOT DETECTED Final   Streptococcus pyogenes NOT DETECTED NOT DETECTED Final   Acinetobacter baumannii NOT DETECTED NOT DETECTED Final   Enterobacteriaceae species DETECTED (A) NOT DETECTED Final    Comment: Enterobacteriaceae represent a large family of gram-negative bacteria, not a single organism. CRITICAL RESULT CALLED TO, READ BACK BY AND VERIFIED WITH: MYRA SLAUGHTER AT 6962 ON 08/20/2019 [place].    Enterobacter cloacae complex NOT DETECTED NOT DETECTED Final   Escherichia coli NOT DETECTED NOT DETECTED Final   Klebsiella oxytoca NOT DETECTED NOT DETECTED Final   Klebsiella pneumoniae NOT DETECTED NOT DETECTED Final   Proteus species DETECTED (A) NOT DETECTED Final    Comment: CRITICAL RESULT CALLED TO, READ BACK BY AND VERIFIED WITH: MYRA SLAUGHTER AT 0824 ON 08/20/2019 [place].    Serratia marcescens NOT DETECTED NOT DETECTED Final   Carbapenem resistance NOT DETECTED NOT DETECTED Final   Haemophilus influenzae NOT DETECTED NOT DETECTED Final   Neisseria meningitidis NOT DETECTED NOT DETECTED Final   Pseudomonas aeruginosa NOT DETECTED NOT DETECTED Final   Candida albicans NOT DETECTED NOT DETECTED Final   Candida glabrata NOT DETECTED NOT DETECTED Final   Candida krusei NOT DETECTED NOT DETECTED Final   Candida parapsilosis NOT DETECTED NOT DETECTED Final   Candida tropicalis NOT DETECTED NOT DETECTED Final    Comment: Performed at [facility], [address] [place], [place] [address]  Urine culture     Status: Abnormal   Collection Time: 08/19/19  9:15 PM   Specimen: In/Out Cath Urine  Result Value Ref Range Status   Specimen Description   Final    IN/OUT CATH URINE Performed at  [facility], [address], [place], [place] [address]    Special Requests   Final    NONE Performed at [facility], [place], [place] [address]    Culture >=100,000 COLONIES/mL PROTEUS MIRABILIS (A)  Final   Report Status 08/22/2019 FINAL  Final   Organism ID, Bacteria PROTEUS MIRABILIS (A)  Final      Susceptibility   Proteus mirabilis - MIC*    AMPICILLIN <=2 SENSITIVE Sensitive     CEFAZOLIN <=4 SENSITIVE Sensitive     CEFTRIAXONE <=0.25 SENSITIVE Sensitive     CIPROFLOXACIN <=0.25 SENSITIVE Sensitive     GENTAMICIN <=1 SENSITIVE Sensitive     IMIPENEM 2 SENSITIVE Sensitive     NITROFURANTOIN 128 RESISTANT Resistant     TRIMETH/SULFA <=20 SENSITIVE Sensitive     AMPICILLIN/SULBACTAM <=2 SENSITIVE Sensitive     PIP/TAZO <=4 SENSITIVE Sensitive     * >=100,000 COLONIES/mL PROTEUS MIRABILIS  SARS Coronavirus 2 by RT PCR (hospital order, performed in [place] hospital lab) Nasopharyngeal Nasopharyngeal Swab     Status: None   Collection Time: 08/19/19  9:15 PM   Specimen: Nasopharyngeal Swab  Result Value Ref Range Status   SARS Coronavirus 2 NEGATIVE NEGATIVE Final    Comment: (NOTE) SARS-CoV-2 target nucleic acids are NOT DETECTED.  The SARS-CoV-2 RNA is generally detectable in upper and lower respiratory specimens during the acute phase of infection.  The lowest concentration of SARS-CoV-2 viral copies this assay can detect is 250 copies / mL. A negative result does not preclude SARS-CoV-2 infection and should not be used as the sole basis for treatment or other patient management decisions.  A negative result may occur with improper specimen collection / handling, submission of specimen other than nasopharyngeal swab, presence of viral mutation(s) within the areas targeted by this assay, and inadequate number of viral copies (<250 copies / mL). A negative result must be combined with clinical observations, patient history, and  epidemiological information.  Fact Sheet for Patients:   StrictlyIdeas.no  Fact Sheet for Healthcare Providers: BankingDealers.co.za  This test is not yet approved or  cleared by the [place] FDA and has been authorized for detection and/or diagnosis of SARS-CoV-2 by FDA under an Emergency Use Authorization (EUA).  This EUA will remain in effect (meaning this test can be used) for the duration of the COVID-19 declaration under Section 564(b)(1) of the Act, 21 U.S.C. section 360bbb-3(b)(1), unless the authorization is terminated or revoked sooner.  Performed at [facility], [address] [place], [place] [address]   MRSA PCR Screening     Status: None   Collection Time: 08/20/19  5:55 AM   Specimen: Nasopharyngeal  Result Value Ref Range Status   MRSA by PCR NEGATIVE NEGATIVE Final    Comment:        The GeneXpert MRSA Assay (FDA approved for NASAL specimens only), is one component of a comprehensive MRSA colonization surveillance program. It is not intended to diagnose MRSA infection nor to guide or monitor treatment for MRSA infections. Performed at [facility], [address] [place], [place] [address]   CULTURE, BLOOD (ROUTINE X 2) w Reflex to ID Panel     Status: None (Preliminary result)   Collection Time: 08/23/19 10:33 AM   Specimen: BLOOD  Result Value Ref Range Status   Specimen Description BLOOD LAC  Final   Special Requests   Final    BOTTLES DRAWN AEROBIC AND ANAEROBIC Blood Culture adequate volume   Culture   Final    NO GROWTH 2 DAYS Performed at [facility], [address], [place], [place] [address]    Report Status PENDING  Incomplete  CULTURE, BLOOD (ROUTINE X 2) w Reflex to ID Panel     Status: None (Preliminary result)   Collection Time: 08/23/19 10:34 AM   Specimen: BLOOD  Result Value Ref Range Status   Specimen Description BLOOD LFA  Final   Special  Requests   Final    BOTTLES DRAWN AEROBIC AND ANAEROBIC Blood Culture adequate volume   Culture   Final    NO GROWTH 2 DAYS Performed at [facility], [address] [place], [place] [address]    Report Status PENDING  Incomplete    IMAGING RESULTS: MRI lumbar spine Concerning for osteomyelitis/discitis involving the L3-L4 interspace. L3-L4 facet joints septic arthritis Right psoas muscle abscess   Chest x-ray: 2 cm rounded nodule within the right perihilar region not clearly urticarial but very frankly I have personally reviewed the films ? Impression/Recommendation 69 year old female with history of Parkinson's disease, neurocognitive impairment, frequent falls, presents from nursing home.  Proteus bacteremia secondary to Proteus urinary tract infection.  Urinary retention leading to bilateral hydronephrosis.  The reason for urinary retention is unclear.  Rule out neurogenic bladder.  Or medication induced.  AKI secondary to urinary retention  L3-L4 discitis/osteomyelitis, right psoas abscess, L3-L4 facet joint arthritis.  The abscess has been aspirated today and there is a drain in place. Cultures pending. Patient is currently on cefazolin.  Likely changed to ceftriaxone. We will check ESR and CRP.  Parkinson's disease on spinal med  Restless leg syndrome on Requip  Hypothyroidism on Synthroid    ? ? ? ___________________________________________________ Discussed with patient,Her attendant and requesting provider Note:  This document was prepared using Dragon voice recognition software and may include unintentional dictation errors.

## 2019-08-25 NOTE — Consult Note (Signed)
Chief Complaint: Discitis/osteomyelitis; right iliopsoas abscess  Referring Physician(s): Randol Kern  Patient Status: [facility] - In-pt  History of Present Illness: Theresa Mills is a 69 y.o. female with multiple multiple medical comorbidities including Parkinson's disease, hypertension, hyperlipidemia and diabetes who was found to have findings concerning for discitis osteomyelitis involving the L3-L4 intervertebral disc space with associated right-sided iliopsoas abscess.  As such, request made for image guided aspiration and/or drainage catheter placement for diagnostic and therapeutic purposes.  Patient remains lethargic and ill-appearing.   Past Medical History:  Diagnosis Date  . Anxiety   . Diabetes ([place])   . Difficult intravenous access   . GERD (gastroesophageal reflux disease)   . HBP (high blood pressure)   . Hyperlipidemia   . Osteoarthritis   . Parkinson disease ([place])   . Sleep apnea   . Thyroid disease     Past Surgical History:  Procedure Laterality Date  . ABDOMINAL HYSTERECTOMY    . CATARACT EXTRACTION Bilateral   . CHOLECYSTECTOMY    . COLONOSCOPY WITH PROPOFOL N/A 04/02/2015   Procedure: COLONOSCOPY WITH PROPOFOL;  Surgeon: Josefine Class, MD;  Location: [facility] ENDOSCOPY;  Service: Endoscopy;  Laterality: N/A;  . ESOPHAGOGASTRODUODENOSCOPY (EGD) WITH PROPOFOL N/A 04/02/2015   Procedure: ESOPHAGOGASTRODUODENOSCOPY (EGD) WITH PROPOFOL;  Surgeon: Josefine Class, MD;  Location: [facility] ENDOSCOPY;  Service: Endoscopy;  Laterality: N/A;  . GALLBLADDER SURGERY    . LAPAROSCOPIC HYSTERECTOMY    . THYROIDECTOMY Left   . TONSILLECTOMY    . TONSILLECTOMY Bilateral     Allergies: Codeine, Ephadrene [cholestatin], Other, and Valium [diazepam]  Medications: Prior to Admission medications   Medication Sig Start Date End Date Taking? Authorizing Provider  ALPRAZolam (XANAX) 0.25 MG tablet Take 1 tablet (0.25 mg total) by mouth every morning. 08/09/19  Yes  Fritzi Mandes, MD  Calcium Carbonate-Vitamin D (CALCIUM HIGH POTENCY/VITAMIN D) 600-200 MG-UNIT TABS Take 1 tablet by mouth daily.    Yes [provider]  carbidopa-levodopa (SINEMET CR) 50-200 MG tablet Take 1 tablet by mouth 2 (two) times daily.  07/31/18  Yes [provider]  carbidopa-levodopa (SINEMET IR) 25-100 MG tablet Take 2 tablets by mouth 4 (four) times daily. 07/25/19  Yes [provider]  celecoxib (CELEBREX) 200 MG capsule Take 1 capsule (200 mg total) by mouth 2 (two) times daily as needed. 07/29/19  Yes Boscia, Greer Ee, NP  citalopram (CELEXA) 40 MG tablet Take 40 mg by mouth daily.   Yes [provider]  docusate sodium (COLACE) 100 MG capsule Take 1 capsule (100 mg total) by mouth 2 (two) times daily. 08/09/19  Yes Fritzi Mandes, MD  levothyroxine (SYNTHROID) 175 MCG tablet Take 1 tablet (175 mcg total) by mouth daily. 02/10/19  Yes Boscia, Greer Ee, NP  metFORMIN (GLUCOPHAGE) 500 MG tablet Take 500 mg by mouth 2 (two) times daily with a meal.    Yes [provider]  Multiple Vitamins-Minerals (MULTIVITAMIN WOMEN 50+ PO) Take 1 tablet by mouth daily.    Yes [provider]  omeprazole (PRILOSEC) 40 MG capsule Take 1 capsule (40 mg total) by mouth daily. 06/12/19  Yes Ronnell Freshwater, NP  ALPRAZolam (XANAX) 0.5 MG tablet Take 1 tablet (0.5 mg total) by mouth at bedtime as needed for anxiety. 08/09/19   Fritzi Mandes, MD  glucose blood (ONETOUCH VERIO) test strip TEST BLOOD SUGAR BID 01/27/16   [provider]  Melatonin 3 MG TABS Take by mouth.    [provider]  mirtazapine (  REMERON) 7.5 MG tablet Take 1 tablet (7.5 mg total) by mouth at bedtime. 02/10/19   Ronnell Freshwater, NP  ondansetron (ZOFRAN-ODT) 4 MG disintegrating tablet Take 1 tablet (4 mg total) by mouth every 8 (eight) hours as needed for nausea or vomiting. 06/05/19   Ronnell Freshwater, NP  ONETOUCH DELICA LANCETS 56D MISC TEST BLOOD SUGAR BID 01/27/16    [provider]  rOPINIRole (REQUIP XL) 2 MG 24 hr tablet Take 2 mg by mouth at bedtime.     [provider]  sertraline (ZOLOFT) 50 MG tablet Take 1 tablet (50 mg total) by mouth daily. 02/10/19   Ronnell Freshwater, NP  simvastatin (ZOCOR) 40 MG tablet Take 1 tablet (40 mg total) by mouth at bedtime. 04/10/19   Ronnell Freshwater, NP     Family History  Problem Relation Age of Onset  . Asthma Father   . Cancer Sister   . Stroke Maternal Uncle   . Heart disease Maternal Uncle   . Diabetes Maternal Uncle     Social History   Socioeconomic History  . Marital status: Widowed    Spouse name: Not on file  . Number of children: Not on file  . Years of education: Not on file  . Highest education level: Not on file  Occupational History  . Not on file  Tobacco Use  . Smoking status: Never Smoker  . Smokeless tobacco: Never Used  Substance and Sexual Activity  . Alcohol use: No  . Drug use: No  . Sexual activity: Not on file  Other Topics Concern  . Not on file  Social History Narrative  . Not on file   Social Determinants of Health   Financial Resource Strain:   . Difficulty of Paying Living Expenses:   Food Insecurity:   . Worried About Charity fundraiser in the Last Year:   . Arboriculturist in the Last Year:   Transportation Needs:   . Film/video editor (Medical):   Marland Kitchen Lack of Transportation (Non-Medical):   Physical Activity:   . Days of Exercise per Week:   . Minutes of Exercise per Session:   Stress:   . Feeling of Stress :   Social Connections:   . Frequency of Communication with Friends and Family:   . Frequency of Social Gatherings with Friends and Family:   . Attends Religious Services:   . Active Member of Clubs or Organizations:   . Attends Archivist Meetings:   Marland Kitchen Marital Status:     ECOG Status: 3 - Symptomatic, >50% confined to bed  Review of Systems: A 12 point ROS discussed and pertinent positives are indicated in  the HPI above.  All other systems are negative.  Review of Systems  Vital Signs: BP (!) 102/63 (BP Location: Right Arm)   Pulse 72   Temp 97.6 F (36.4 C) (Oral)   Resp 19   Ht 5\' 4"  (1.626 m)   Wt 74.4 kg   SpO2 97%   BMI 28.15 kg/m   Physical Exam  Imaging: CT ABDOMEN PELVIS WO CONTRAST  Result Date: 08/19/2019 CLINICAL DATA:  69 year old female with abdominal pain. Concern for bowel obstruction. EXAM: CT CHEST, ABDOMEN AND PELVIS WITHOUT CONTRAST TECHNIQUE: Multidetector CT imaging of the chest, abdomen and pelvis was performed following the standard protocol without IV contrast. COMPARISON:  Chest radiograph dated 08/19/2019. FINDINGS: Evaluation of this exam is limited in the absence of intravenous contrast. CT CHEST  FINDINGS Cardiovascular: There is no cardiomegaly or pericardial effusion. Coronary vascular calcification of the LAD. Mild atherosclerotic calcification of the thoracic aorta. The central pulmonary arteries are grossly unremarkable on this noncontrast CT. Mediastinum/Nodes: There is no hilar or mediastinal adenopathy. The esophagus and the thyroid gland are grossly unremarkable as visualized. No mediastinal fluid collection. Lungs/Pleura: Mild pleural thickening versus minimal right pleural effusion. Right lung base linear atelectasis/scarring. No focal consolidation, or pneumothorax. The central airways are patent. Musculoskeletal: Osteopenia with scoliosis and degenerative changes of the spine. Old healed left posterior rib fractures. No acute osseous pathology. CT ABDOMEN PELVIS FINDINGS No intra-abdominal free air or free fluid. Hepatobiliary: Several small hepatic hypodense lesions are not characterized on this noncontrast CT. Cholecystectomy. Pancreas: The pancreas is grossly unremarkable. Spleen: Normal in size without focal abnormality. Adrenals/Urinary Tract: The adrenal glands are unremarkable. Mild bilateral hydronephrosis, left greater right. Several nonobstructing  left renal calculi measure up to 4 mm. There is left perinephric stranding concerning for pyelonephritis. Correlation with urinalysis recommended. The urinary bladder is distended. There is haziness of the bladder wall with perivesical stranding. Stomach/Bowel: There is no bowel obstruction or active inflammation. The appendix is normal. Vascular/Lymphatic: Moderate aortoiliac atherosclerotic disease. The IVC is grossly unremarkable. No portal venous gas. There is no adenopathy. Reproductive: Hysterectomy. Other: Mild diffuse subcutaneous edema. No fluid collection. Musculoskeletal: There is severe osteopenia, scoliosis, and degenerative changes of the spine and hips. There are compression fractures of the superior endplate of L4 and inferior endplate of L3, likely acute. Clinical correlation is recommended. There is approximately 25% loss of vertebral body height at L4. There is disc desiccation at L2-L3 and L3-L4. Bilateral L5 pars defects with grade 1 L5-S1 anterolisthesis. IMPRESSION: 1. Compression fractures of the superior endplate of L4 and inferior endplate of L3, likely acute. Clinical correlation is recommended. 2. Distended urinary bladder with mild bilateral hydronephrosis and findings concerning for pyelonephritis. Correlation with urinalysis recommended. 3. Several nonobstructing left renal calculi.  No obstructing stone. 4. No bowel obstruction. Normal appendix. 5. Aortic Atherosclerosis (ICD10-I70.0). Electronically Signed   By: Anner Crete M.D.   On: 08/19/2019 20:15   DG Pelvis 1-2 Views  Result Date: 08/05/2019 CLINICAL DATA:  Left hip pain after multiple falls. EXAM: PELVIS - 1-2 VIEW COMPARISON:  Jun 10, 2017. FINDINGS: There is no evidence of pelvic fracture or diastasis. No pelvic bone lesions are seen. Mild-to-moderate degenerative changes seen involving both hip joints. IMPRESSION: Mild to moderate degenerative joint disease of both hip joints. No acute abnormality seen in the  pelvis. Electronically Signed   By: Marijo Conception M.D.   On: 08/05/2019 09:54   CT Head Wo Contrast  Result Date: 08/19/2019 CLINICAL DATA:  Altered mental status EXAM: CT HEAD WITHOUT CONTRAST CT CERVICAL SPINE WITHOUT CONTRAST TECHNIQUE: Multidetector CT imaging of the head and cervical spine was performed following the standard protocol without intravenous contrast. Multiplanar CT image reconstructions of the cervical spine were also generated. COMPARISON:  CT brain and cervical spine 08/05/2019 FINDINGS: CT HEAD FINDINGS Brain: No acute territorial infarction, hemorrhage or intracranial mass. Atrophy. Mild chronic small vessel ischemic change of the white matter. Stable ventricle size. Vascular: No hyperdense vessels. Scattered carotid vascular calcification. Skull: Normal. Negative for fracture or focal lesion. Sinuses/Orbits: No acute finding. Other: None CT CERVICAL SPINE FINDINGS Alignment: No subluxation.  Facet alignment is maintained. Skull base and vertebrae: No acute fracture. No primary bone lesion or focal pathologic process. Soft tissues and spinal canal: No prevertebral fluid  or swelling. No visible canal hematoma. Disc levels: Mild degenerative changes at C5-C6, C6-C7 and C7-T1. Facet degenerative changes at multiple levels. Upper chest: Negative. Other: None IMPRESSION: 1. No CT evidence for acute intracranial abnormality. Atrophy and mild chronic small vessel ischemic change of the white matter. 2. No acute osseous abnormality of the cervical spine Electronically Signed   By: Donavan Foil M.D.   On: 08/19/2019 20:28   CT Head Wo Contrast  Result Date: 08/05/2019 CLINICAL DATA:  Several recent falls EXAM: CT HEAD WITHOUT CONTRAST CT CERVICAL SPINE WITHOUT CONTRAST TECHNIQUE: Multidetector CT imaging of the head and cervical spine was performed following the standard protocol without intravenous contrast. Multiplanar CT image reconstructions of the cervical spine were also generated.  COMPARISON:  CT head and CT cervical spine Jun 10, 2017 FINDINGS: CT HEAD FINDINGS Brain: Moderate diffuse atrophy is stable. There is no intracranial mass, hemorrhage, extra-axial fluid collection, or midline shift. There is rather minimal small vessel disease in the centra semiovale bilaterally. No evident acute infarct. Vascular: No hyperdense vessel. There are foci of calcification in each carotid siphon. Skull: The bony calvarium appears intact. Sinuses/Orbits: There is an apparent retention cyst in the inferior left maxillary antrum. There is mucosal thickening in several ethmoid air cells. Other visualized paranasal sinuses are clear. Orbits appear symmetric bilaterally. There is rightward nasal septal deviation. Other: Mastoid air cells are clear. CT CERVICAL SPINE FINDINGS Alignment: There is no spondylolisthesis. Skull base and vertebrae: The skull base and craniocervical junction regions appear normal. There is no demonstrable fracture. No blastic or lytic bone lesions. Soft tissues and spinal canal: Prevertebral soft tissues are normal. There is narrowing of the predental space with osteoarthritic change in this area. There is no evident cord or canal hematoma. No paraspinous lesions are evident. Disc levels: There is mild disc space narrowing at C6-7 and C7-T1. Other disc spaces appear unremarkable. There is facet hypertrophy at multiple levels. No disc extrusion or stenosis evident. Upper chest: 6 visualized upper lung regions are clear. Other: There are foci of calcification in left carotid artery. IMPRESSION: CT head: Atrophy with slight periventricular small vessel disease. No acute infarct. No mass or hemorrhage. There are foci of arterial vascular calcification. There are foci of paranasal sinus disease. There is nasal septal deviation. CT cervical spine: No fracture or spondylolisthesis. Osteoarthritic change noted at multiple levels. No disc extrusion or stenosis. There is left carotid artery  calcification. Electronically Signed   By: Lowella Grip III M.D.   On: 08/05/2019 10:29   CT Chest Wo Contrast  Result Date: 08/19/2019 CLINICAL DATA:  69 year old female with abdominal pain. Concern for bowel obstruction. EXAM: CT CHEST, ABDOMEN AND PELVIS WITHOUT CONTRAST TECHNIQUE: Multidetector CT imaging of the chest, abdomen and pelvis was performed following the standard protocol without IV contrast. COMPARISON:  Chest radiograph dated 08/19/2019. FINDINGS: Evaluation of this exam is limited in the absence of intravenous contrast. CT CHEST FINDINGS Cardiovascular: There is no cardiomegaly or pericardial effusion. Coronary vascular calcification of the LAD. Mild atherosclerotic calcification of the thoracic aorta. The central pulmonary arteries are grossly unremarkable on this noncontrast CT. Mediastinum/Nodes: There is no hilar or mediastinal adenopathy. The esophagus and the thyroid gland are grossly unremarkable as visualized. No mediastinal fluid collection. Lungs/Pleura: Mild pleural thickening versus minimal right pleural effusion. Right lung base linear atelectasis/scarring. No focal consolidation, or pneumothorax. The central airways are patent. Musculoskeletal: Osteopenia with scoliosis and degenerative changes of the spine. Old healed left posterior rib fractures.  No acute osseous pathology. CT ABDOMEN PELVIS FINDINGS No intra-abdominal free air or free fluid. Hepatobiliary: Several small hepatic hypodense lesions are not characterized on this noncontrast CT. Cholecystectomy. Pancreas: The pancreas is grossly unremarkable. Spleen: Normal in size without focal abnormality. Adrenals/Urinary Tract: The adrenal glands are unremarkable. Mild bilateral hydronephrosis, left greater right. Several nonobstructing left renal calculi measure up to 4 mm. There is left perinephric stranding concerning for pyelonephritis. Correlation with urinalysis recommended. The urinary bladder is distended. There is  haziness of the bladder wall with perivesical stranding. Stomach/Bowel: There is no bowel obstruction or active inflammation. The appendix is normal. Vascular/Lymphatic: Moderate aortoiliac atherosclerotic disease. The IVC is grossly unremarkable. No portal venous gas. There is no adenopathy. Reproductive: Hysterectomy. Other: Mild diffuse subcutaneous edema. No fluid collection. Musculoskeletal: There is severe osteopenia, scoliosis, and degenerative changes of the spine and hips. There are compression fractures of the superior endplate of L4 and inferior endplate of L3, likely acute. Clinical correlation is recommended. There is approximately 25% loss of vertebral body height at L4. There is disc desiccation at L2-L3 and L3-L4. Bilateral L5 pars defects with grade 1 L5-S1 anterolisthesis. IMPRESSION: 1. Compression fractures of the superior endplate of L4 and inferior endplate of L3, likely acute. Clinical correlation is recommended. 2. Distended urinary bladder with mild bilateral hydronephrosis and findings concerning for pyelonephritis. Correlation with urinalysis recommended. 3. Several nonobstructing left renal calculi.  No obstructing stone. 4. No bowel obstruction. Normal appendix. 5. Aortic Atherosclerosis (ICD10-I70.0). Electronically Signed   By: Anner Crete M.D.   On: 08/19/2019 20:15   CT Cervical Spine Wo Contrast  Result Date: 08/19/2019 CLINICAL DATA:  Altered mental status EXAM: CT HEAD WITHOUT CONTRAST CT CERVICAL SPINE WITHOUT CONTRAST TECHNIQUE: Multidetector CT imaging of the head and cervical spine was performed following the standard protocol without intravenous contrast. Multiplanar CT image reconstructions of the cervical spine were also generated. COMPARISON:  CT brain and cervical spine 08/05/2019 FINDINGS: CT HEAD FINDINGS Brain: No acute territorial infarction, hemorrhage or intracranial mass. Atrophy. Mild chronic small vessel ischemic change of the white matter. Stable  ventricle size. Vascular: No hyperdense vessels. Scattered carotid vascular calcification. Skull: Normal. Negative for fracture or focal lesion. Sinuses/Orbits: No acute finding. Other: None CT CERVICAL SPINE FINDINGS Alignment: No subluxation.  Facet alignment is maintained. Skull base and vertebrae: No acute fracture. No primary bone lesion or focal pathologic process. Soft tissues and spinal canal: No prevertebral fluid or swelling. No visible canal hematoma. Disc levels: Mild degenerative changes at C5-C6, C6-C7 and C7-T1. Facet degenerative changes at multiple levels. Upper chest: Negative. Other: None IMPRESSION: 1. No CT evidence for acute intracranial abnormality. Atrophy and mild chronic small vessel ischemic change of the white matter. 2. No acute osseous abnormality of the cervical spine Electronically Signed   By: Donavan Foil M.D.   On: 08/19/2019 20:28   CT Cervical Spine Wo Contrast  Result Date: 08/05/2019 CLINICAL DATA:  Several recent falls EXAM: CT HEAD WITHOUT CONTRAST CT CERVICAL SPINE WITHOUT CONTRAST TECHNIQUE: Multidetector CT imaging of the head and cervical spine was performed following the standard protocol without intravenous contrast. Multiplanar CT image reconstructions of the cervical spine were also generated. COMPARISON:  CT head and CT cervical spine Jun 10, 2017 FINDINGS: CT HEAD FINDINGS Brain: Moderate diffuse atrophy is stable. There is no intracranial mass, hemorrhage, extra-axial fluid collection, or midline shift. There is rather minimal small vessel disease in the centra semiovale bilaterally. No evident acute infarct. Vascular: No hyperdense vessel.  There are foci of calcification in each carotid siphon. Skull: The bony calvarium appears intact. Sinuses/Orbits: There is an apparent retention cyst in the inferior left maxillary antrum. There is mucosal thickening in several ethmoid air cells. Other visualized paranasal sinuses are clear. Orbits appear symmetric  bilaterally. There is rightward nasal septal deviation. Other: Mastoid air cells are clear. CT CERVICAL SPINE FINDINGS Alignment: There is no spondylolisthesis. Skull base and vertebrae: The skull base and craniocervical junction regions appear normal. There is no demonstrable fracture. No blastic or lytic bone lesions. Soft tissues and spinal canal: Prevertebral soft tissues are normal. There is narrowing of the predental space with osteoarthritic change in this area. There is no evident cord or canal hematoma. No paraspinous lesions are evident. Disc levels: There is mild disc space narrowing at C6-7 and C7-T1. Other disc spaces appear unremarkable. There is facet hypertrophy at multiple levels. No disc extrusion or stenosis evident. Upper chest: 6 visualized upper lung regions are clear. Other: There are foci of calcification in left carotid artery. IMPRESSION: CT head: Atrophy with slight periventricular small vessel disease. No acute infarct. No mass or hemorrhage. There are foci of arterial vascular calcification. There are foci of paranasal sinus disease. There is nasal septal deviation. CT cervical spine: No fracture or spondylolisthesis. Osteoarthritic change noted at multiple levels. No disc extrusion or stenosis. There is left carotid artery calcification. Electronically Signed   By: Lowella Grip III M.D.   On: 08/05/2019 10:29   MR LUMBAR SPINE WO CONTRAST  Result Date: 08/24/2019 CLINICAL DATA:  Initial evaluation for compression fracture. EXAM: MRI LUMBAR SPINE WITHOUT CONTRAST TECHNIQUE: Multiplanar, multisequence MR imaging of the lumbar spine was performed. No intravenous contrast was administered. COMPARISON:  Prior CT from 08/19/2019. FINDINGS: Segmentation: Standard. Lowest well-formed disc space labeled the L5-S1 level. Alignment: Moderate levoscoliosis with apex at L2. Chronic bilateral pars defects at L5 with associated 8 mm spondylolisthesis of L5 on S1. Trace 2 mm retrolisthesis of  L1 on L2 and L2 on L3. Vertebrae: There is abnormal fluid signal intensity within the L3-4 interspace with associated marrow edema within the adjacent endplates of L3 and L4. Associated paraspinous edema within the adjacent psoas musculature. Findings concerning for possible osteomyelitis discitis. Previously identified superimposed compression fracture at the superior endplate of L4 again seen, associated height loss measures up to 40% with 3 mm bony retropulsion. Additional acute to subacute compression fracture extending through the inferior endplate of L3 with mild 25% central height loss and trace 2 mm bony retropulsion. Mild height loss at the superior endplate of T90 is chronic in appearance. Vertebral body height otherwise maintained. Additionally, there is abnormal marrow edema about the right greater than left L3-4 facets with associated small joint effusions and adjacent paraspinous edema. While these findings could be degenerative, associated septic arthritis could also be present. Evidence for epidural collection involving the ventral and right posterolateral epidural space at the level of L3-4, most likely epidural abscess (series 15, images 24, 27). Additionally, fluid signal intensity seen within the L2-3 interspace, favored to be degenerative, although possible discitis at this level not excluded. Additional more mild fluid signal intensity noted superiorly within the L1-2 interspace as well, favored to be degenerative. Underlying bone marrow signal intensity otherwise within normal limits. Multiple scattered benign hemangiomata noted. No worrisome osseous lesions. Conus medullaris and cauda equina: Conus extends to the L2 level. Conus and cauda equina appear normal. Paraspinal and other soft tissues: Abnormal edema seen within the right greater than  left psoas musculature bilaterally. Superimposed loculated collection within the right psoas muscle measures 1.7 x 1.7 x 4.9 cm (series 15, image 31),  most likely reflecting abscess. Scattered fat stranding noted within the adjacent retroperitoneum. Soft tissue edema also noted within the posterior paraspinous musculature, most pronounced about the L3-4 facets. Partially visualized visceral structures grossly unremarkable. Disc levels: T12-L1: Mild left eccentric disc bulge. Superimposed right extraforaminal disc protrusion (series 15, image 8). Mild facet hypertrophy. No significant stenosis. L1-2: Diffuse disc bulge with disc desiccation and intervertebral disc space narrowing. Associated reactive endplate changes with marginal endplate spurring superimposed more focal right extraforaminal disc osteophyte complex (series 15, image 15). Mild to moderate facet hypertrophy, greater on the left. No significant spinal stenosis. Foramina remain patent. L2-3: Advanced intervertebral disc space narrowing with diffuse disc bulge and disc desiccation. Disc bulging asymmetric to the right with exuberant right-sided reactive endplate spurring. Fluid signal intensity within the central and right aspect of the disc space favored to be degenerative, although changes related to discitis not entirely excluded. Moderate bilateral facet hypertrophy. Small amount of epidural abscess seen at the right posterior epidural space, tracking inferiorly from the L3-4 level (series 15, image 22). Resultant moderate spinal stenosis with right greater than left lateral recess narrowing. Mild bilateral L2 foraminal stenosis. L3-4: Findings concerning for osteomyelitis discitis with adjacent endplate compression fractures. Superimposed mild disc bulge and/or epidural abscess within the ventral epidural space. Epidural abscess extends to involve the right posterior aspect of the thecal sac. Superimposed moderate facet hypertrophy with associated joint effusions and probable septic arthritis. Resultant moderate to severe spinal stenosis. Thecal sac measures 8 mm in AP diameter at its most narrow  point. Moderate right greater than left L3 foraminal narrowing. L4-5: Disc desiccation with minimal disc bulge. Superimposed shallow left extraforaminal disc protrusion. Moderate facet hypertrophy. No spinal stenosis. Foramina remain patent. L5-S1: Chronic bilateral pars defects with associated 8 mm spondylolisthesis. Moderate right worse than left facet hypertrophy. No spinal stenosis. Mild right with moderate to severe left L5 foraminal stenosis. IMPRESSION: 1. Findings concerning for osteomyelitis discitis involving the L3-4 interspace as above. Associated epidural abscess at the L3-4 level with resultant moderate to severe spinal stenosis. 2. Abnormal edema with joint effusions involving the bilateral L3-4 facets, concerning for concomitant septic arthritis. 3. Associated paraspinous inflammation centered about the L3-4 interspace with superimposed 1.7 x 1.7 x 4.9 cm collection within the right psoas muscle, likely abscess. 4. Superimposed acute to subacute compression deformities involving the inferior endplate of Z1-24% height loss with 2 mm bony retropulsion, and superior endplate of P8-09% height loss with 3 mm bony retropulsion. 5. Additional fluid signal intensity within the L1-2 and L2-3 interspaces, favored to be degenerative, although additional levels of discitis somewhat difficult to exclude. Attention at follow-up recommended. 6. Chronic bilateral pars defects at L5-S1 with associated 8 mm spondylolisthesis of L5 on S1 with moderate to severe left L5 foraminal stenosis. 7. Underlying moderate levoscoliosis with additional moderate multilevel degenerative spondylosis elsewhere as above. Results were called by telephone at the time of interpretation on 08/24/2019 at 10:49 pm to provider Sharion Settler, NP, who verbally acknowledged these results. Electronically Signed   By: Jeannine Boga M.D.   On: 08/24/2019 22:51   DG Chest Port 1 View  Result Date: 08/19/2019 CLINICAL DATA:  Change in  mental status EXAM: PORTABLE CHEST 1 VIEW COMPARISON:  Jun 10, 2017 FINDINGS: The heart size and mediastinal contours are within normal limits. Aortic knob calcifications are seen. There  appears to be a 2 cm rounded nodule seen within the right perihilar region. No large airspace consolidation seen within the left lung. No pleural effusion. No acute osseous abnormality. IMPRESSION: 2 cm rounded nodule within the right perihilar region not clearly identified on the prior exam. Would recommend dedicated nonemergent chest CT for further evaluation. Electronically Signed   By: Prudencio Pair M.D.   On: 08/19/2019 19:21   DG Knee Complete 4 Views Left  Result Date: 08/06/2019 CLINICAL DATA:  Left knee pain after fall. EXAM: LEFT KNEE - COMPLETE 4+ VIEW COMPARISON:  None. FINDINGS: No evidence of fracture or dislocation. Moderate to advanced tricompartmental peripheral spurring. There is mild medial tibiofemoral joint space narrowing. Degenerative spurring of the tibial spines. There is a small to moderate joint effusion. Small quadriceps tendon enthesophyte. Soft tissues are unremarkable. IMPRESSION: 1. No acute fracture or subluxation of the left knee. 2. Moderate to advanced tricompartmental osteoarthritis. Small to moderate joint effusion. Electronically Signed   By: Keith Rake M.D.   On: 08/06/2019 17:20   DG Knee Complete 4 Views Right  Result Date: 08/05/2019 CLINICAL DATA:  Right knee pain after fall. EXAM: RIGHT KNEE - COMPLETE 4+ VIEW COMPARISON:  Jun 10, 2017. FINDINGS: No evidence of fracture, dislocation, or joint effusion. Severe narrowing of lateral joint space is noted with osteophyte formation. Moderate narrowing of patellofemoral space is noted with osteophyte formation. Soft tissues are unremarkable. IMPRESSION: Severe degenerative joint disease. No acute abnormality seen in the right knee. Electronically Signed   By: Marijo Conception M.D.   On: 08/05/2019 09:49   DG Foot Complete  Left  Result Date: 08/05/2019 CLINICAL DATA:  Acute left foot pain after multiple falls. EXAM: LEFT FOOT - COMPLETE 3+ VIEW COMPARISON:  April 05, 2015. FINDINGS: Mildly displaced fracture is seen involving the proximal base of the fourth proximal phalanx. Mild posterior calcaneal spurring is noted. Mild hallux valgus deformity is seen involving the first metatarsophalangeal joint. IMPRESSION: Mildly displaced fourth proximal phalangeal fracture. Electronically Signed   By: Marijo Conception M.D.   On: 08/05/2019 09:52   US Abdomen Limited RUQ  Result Date: 08/22/2019 CLINICAL DATA:  Elevated liver enzymes EXAM: ULTRASOUND ABDOMEN LIMITED RIGHT UPPER QUADRANT COMPARISON:  CT abdomen and pelvis August 19, 2019 FINDINGS: Surgically absent. Common bile duct: Diameter: 6 mm. No intrahepatic or extrahepatic biliary duct dilatation. Liver: No focal lesion identified. Liver echogenicity is somewhat inhomogeneous without focal liver lesion demonstrable. Portal vein is patent on color Doppler imaging with normal direction of blood flow towards the liver. Other: None. IMPRESSION: 1. Liver echogenicity is somewhat inhomogeneous which may indicate fatty infiltration. No focal liver lesions are demonstrable by ultrasound. 2.  Gallbladder absent. Electronically Signed   By: Lowella Grip III M.D.   On: 08/22/2019 08:15    Labs:  CBC: Recent Labs    08/22/19 0608 08/23/19 0545 08/24/19 1035 08/25/19 0910  WBC 17.4* 19.1* 13.4* 11.3*  HGB 10.5* 11.1* 10.5* 10.0*  HCT 30.7* 33.8* 30.9* 30.3*  PLT 251 264 269 277    COAGS: Recent Labs    08/20/19 0420  INR 1.3*    BMP: Recent Labs    08/22/19 0608 08/23/19 0545 08/24/19 1035 08/25/19 0910  NA 142 139 136 136  K 4.2 4.3 4.1 3.9  CL 116* 108 107 107  CO2 19* 21* 20* 23  GLUCOSE 108* 110* 135* 110*  BUN 33* 40* 31* 25*  CALCIUM 8.0* 8.1* 7.8* 7.6*  CREATININE 0.84  1.21* 0.76 0.65  GFRNONAA >60 45* >60 >60  GFRAA >60 52* >60 >60    LIVER  FUNCTION TESTS: Recent Labs    08/19/19 1858 08/21/19 0548 08/24/19 1035  BILITOT 1.2 1.0 0.8  AST 271* 103* 101*  ALT 24 64* 20  ALKPHOS 137* 94 93  PROT 7.8 6.2* 6.5  ALBUMIN 2.6* 1.8* 1.8*    TUMOR MARKERS: No results for input(s): AFPTM, CEA, CA199, CHROMGRNA in the last 8760 hours.  Assessment and Plan:  AILEENA IGLESIA is a 69 y.o. female with multiple multiple medical comorbidities including Parkinson's disease, hypertension, hyperlipidemia and diabetes who was found to have findings concerning for discitis osteomyelitis involving the L3-L4 intervertebral disc space with associated right-sided iliopsoas abscess.  As such, request made for image guided aspiration and/or drainage catheter placement for diagnostic and therapeutic purposes.  Patient remains lethargic and ill-appearing.  Risks and benefits of CT guided right iliopsoas abscess aspiration and/or drainage catheter placement was discussed with the patient including bleeding, infection, damage to adjacent structures, bowel perforation/fistula connection, and sepsis.  All of the patient's questions were answered, patient is agreeable to proceed. Consent signed and in chart.  Thank you for this interesting consult.  I greatly enjoyed meeting Theresa Mills and look forward to participating in their care.  A copy of this report was sent to the requesting provider on this date.  Electronically Signed: Sandi Mariscal, MD 08/25/2019, 11:44 AM   I spent a total of 20 Minutes in face to face in clinical consultation, greater than 50% of which was counseling/coordinating care for CT-guided right iliopsoas abscess aspiration and/or drainage catheter placement.

## 2019-08-25 NOTE — Progress Notes (Signed)
Pt was scheduled for an IR guided procedure today. Pt left the unit at 1121. Pt tolerated the procedure and had an JP drain place in her back Psoas muscle. Serosanguinous drainage present. Pt was given morning meds and 1 dose of pain meds. Her caretaker Vaughan Basta came to vist the patient today. Passed on the next nurse to update  Visitors list in [place]. Pt was resting comfortably at the end of the shift.

## 2019-08-26 ENCOUNTER — Inpatient Hospital Stay: Payer: Self-pay

## 2019-08-26 DIAGNOSIS — M4626 Osteomyelitis of vertebra, lumbar region: Secondary | ICD-10-CM

## 2019-08-26 DIAGNOSIS — M4646 Discitis, unspecified, lumbar region: Secondary | ICD-10-CM

## 2019-08-26 DIAGNOSIS — R339 Retention of urine, unspecified: Secondary | ICD-10-CM

## 2019-08-26 LAB — CBC WITH DIFFERENTIAL/PLATELET
Abs Immature Granulocytes: 0.2 10*3/uL — ABNORMAL HIGH (ref 0.00–0.07)
Basophils Absolute: 0 10*3/uL (ref 0.0–0.1)
Basophils Relative: 0 %
Eosinophils Absolute: 0.1 10*3/uL (ref 0.0–0.5)
Eosinophils Relative: 1 %
HCT: 30.8 % — ABNORMAL LOW (ref 36.0–46.0)
Hemoglobin: 10.5 g/dL — ABNORMAL LOW (ref 12.0–15.0)
Immature Granulocytes: 2 %
Lymphocytes Relative: 13 %
Lymphs Abs: 1.5 10*3/uL (ref 0.7–4.0)
MCH: 31.7 pg (ref 26.0–34.0)
MCHC: 34.1 g/dL (ref 30.0–36.0)
MCV: 93.1 fL (ref 80.0–100.0)
Monocytes Absolute: 0.5 10*3/uL (ref 0.1–1.0)
Monocytes Relative: 4 %
Neutro Abs: 8.9 10*3/uL — ABNORMAL HIGH (ref 1.7–7.7)
Neutrophils Relative %: 80 %
Platelets: 319 10*3/uL (ref 150–400)
RBC: 3.31 MIL/uL — ABNORMAL LOW (ref 3.87–5.11)
RDW: 13.2 % (ref 11.5–15.5)
WBC: 11.2 10*3/uL — ABNORMAL HIGH (ref 4.0–10.5)
nRBC: 0 % (ref 0.0–0.2)

## 2019-08-26 LAB — BASIC METABOLIC PANEL
Anion gap: 8 (ref 5–15)
BUN: 23 mg/dL (ref 8–23)
CO2: 24 mmol/L (ref 22–32)
Calcium: 8.1 mg/dL — ABNORMAL LOW (ref 8.9–10.3)
Chloride: 106 mmol/L (ref 98–111)
Creatinine, Ser: 0.7 mg/dL (ref 0.44–1.00)
GFR calc Af Amer: >60 mL/min (ref >60)
GFR calc non Af Amer: >60 mL/min (ref >60)
Glucose, Bld: 145 mg/dL — ABNORMAL HIGH (ref 70–99)
Potassium: 3.9 mmol/L (ref 3.5–5.1)
Sodium: 138 mmol/L (ref 135–145)

## 2019-08-26 LAB — GLUCOSE, CAPILLARY
Glucose-Capillary: 127 mg/dL — ABNORMAL HIGH (ref 70–99)
Glucose-Capillary: 127 mg/dL — ABNORMAL HIGH (ref 70–99)
Glucose-Capillary: 106 mg/dL — ABNORMAL HIGH (ref 70–99)
Glucose-Capillary: 106 mg/dL — ABNORMAL HIGH (ref 70–99)
Glucose-Capillary: 139 mg/dL — ABNORMAL HIGH (ref 70–99)

## 2019-08-26 LAB — C-REACTIVE PROTEIN: CRP: 6 mg/dL — ABNORMAL HIGH (ref <1.0)

## 2019-08-26 LAB — SEDIMENTATION RATE: Sed Rate: 85 mm/hr — ABNORMAL HIGH (ref 0–30)

## 2019-08-26 MED ORDER — SODIUM CHLORIDE 0.9 % IV SOLN
INTRAVENOUS | Status: DC | PRN
  Administered 2019-08-26 (×2): 1000 mL via INTRAVENOUS

## 2019-08-26 MED ORDER — MEGESTROL ACETATE 20 MG PO TABS
40.0000 mg | ORAL_TABLET | Freq: Every day | ORAL | Status: DC
  Administered 2019-08-26 – 2019-09-03 (×9): 40 mg via ORAL
  Filled 2019-08-26 (×9): qty 2

## 2019-08-26 MED ORDER — ENOXAPARIN SODIUM 40 MG/0.4ML ~~LOC~~ SOLN
40.0000 mg | SUBCUTANEOUS | Status: DC
  Administered 2019-08-26 – 2019-09-02 (×8): 40 mg via SUBCUTANEOUS
  Filled 2019-08-26 (×8): qty 0.4

## 2019-08-26 MED ORDER — SODIUM CHLORIDE 0.9 % IV SOLN
2.0000 g | INTRAVENOUS | Status: DC
  Administered 2019-08-26 – 2019-09-03 (×9): 2 g via INTRAVENOUS
  Filled 2019-08-26: qty 2
  Filled 2019-08-26: qty 20
  Filled 2019-08-26 (×5): qty 2
  Filled 2019-08-26: qty 20
  Filled 2019-08-26 (×2): qty 2

## 2019-08-26 NOTE — TOC Transition Note (Signed)
Transition of Care [facility]) - CM/SW Discharge Note   Patient Details  Name: Theresa Mills MRN: [id number] Date of Birth: 06-16-1950  [facility] CM/SW Contact:  Meriel Flavors, LCSW Phone Number: 08/26/2019, 2:48 PM   Clinical Narrative:    8/3: Patient and family have requested not to return to [facility]. Patient has requested SNF placement at [facility]. CSW contacted Magda Paganini, patient has a bed at [facility] when ready to discharge. Call Corinna @ [phone] when ready          Patient Goals and CMS Choice        Discharge Placement                       Discharge Plan and Services                                     Social Determinants of Health (SDOH) Interventions     Readmission Risk Interventions No flowsheet data found.

## 2019-08-26 NOTE — Progress Notes (Signed)
Nutrition Follow Up Note   DOCUMENTATION CODES:   Non-severe (moderate) malnutrition in context of chronic illness  INTERVENTION:   Continue Ensure Enlive po TID, each supplement provides 350 kcal and 20 grams of protein  Add Magic cup TID with meals, each supplement provides 290 kcal and 9 grams of protein  Continue MVI daily   Recommend Liberalize diet   Recommend consider additional appetite stimulant vs nasogastric tube placement and nutrition support  Pt is at high refeed risk   NUTRITION DIAGNOSIS:   Moderate Malnutrition related to chronic illness (parkinsons, dementia) as evidenced by mild fat depletion, mild muscle depletion, 18 percent weight loss in < 1 year.  GOAL:   Patient will meet greater than or equal to 90% of their needs  -not met   MONITOR:   PO intake, Supplement acceptance, Labs, Weight trends, Skin, I & O's  ASSESSMENT:   69 y.o. female with medical history significant for DM, GERD, anxiety, OSA, Parkinson's with dementia, hypothyroidism, frequent falls, brought in by EMS after home reported shortness of breath and altered mental status. Pt found to have pyelonephritis and sepsis  Pt s/p IR drain 8/2 for paraspinal abscess  Pt continues to have poor appetite and oral intake; pt eating only sips and bites of meals. Pt is drinking small amounts of Ensure. Pt started on remeron 7/29 with no improvement in appetite. Pt is now without adequate nutrition for 7 days. May need to consider an additional appetite stimulant vs nasogastric tube and nutrition support. Would recommend liberal diet once advanced; pt currently NPO. RD will add Magic Cups to meal trays. Pt is at high refeed risk. Discussed nutrition status with MD. Per chart, pt is weight stable since admit.   Medications reviewed and include: oscal w/ D, celexa, colace, lovenox, insulin, synthroid, remeron, cefazolin, MVI  Labs reviewed: wbc- 11.2(H), Hgb 10.5(L), Hct 30.8(L)  Diet Order:    Diet  Order            Diet NPO time specified Except for: Sips with Meds  Diet effective now                EDUCATION NEEDS:   No education needs have been identified at this time  Skin:  Skin Assessment: Reviewed RN Assessment (ecchymosis, MASD, Stage II coccyx)  Last BM:  8/1- type 4  Height:   Ht Readings from Last 1 Encounters:  08/19/19 _0  (1.626 m)    Weight:   Wt Readings from Last 1 Encounters:  08/26/19 75.1 kg    Ideal Body Weight:  54.5 kg  BMI:  Body mass index is 28.43 kg/m.  Estimated Nutritional Needs:   Kcal:  1700-1900kcal/day  Protein:  80-90g/day  Fluid:  1.4-1.7L/day  Koleen Distance MS, RD, LDN Please refer to [facility] for RD and/or RD on-call/weekend/after hours pager

## 2019-08-26 NOTE — Progress Notes (Signed)
ID Proteus in psoas abscess culture She will need 6 weeks of IV ceftriaxone 2 grams q 24 Will need ESR/CRP monitoring   

## 2019-08-26 NOTE — Progress Notes (Signed)
PROGRESS NOTE    MADELYNNE LASKER   KDT:[id number]  DOB: [birth year]  PCP: Ronnell Freshwater, NP    DOA: 08/19/2019 LOS: 7   Brief Narrative   Theresa Mills is a 69 y.o. female with medical history of DM, GERD, anxiety, OSA, Parkinson's with dementia, hypothyroidism, frequent falls, brought in 08/19/19 by EMS after SNF staff called due to shortness of breath and altered mental status.  Patient complained of abdominal pain and neck pain but could not provide additional history due to dementia and altered mental status.  In the ED, patient was lethargic, afebrile, hypotensive with BP 83/41, HR 90, RR 24, O2 sat 93% on room air.  Labs were notable for leukocytosis 18k, lactic acidosis 2.5.  UA showed pyuria.  Creatinine was 2.43 (baseline 0.69), procalcitonin 4.62, troponin 28.  CT abdomen pelvis showed distended urinary bladder with bilateral hydronephrosis and findings concerning for pyelonephritis, several nonobstructing left renal calculi.  No focal consolidation was seen on CT chest.  EKG was normal sinus rhythm.  Patient's blood pressure improved with IV fluid bolus.  Started on IV antibiotics and admitted to hospitalist service for further evaluation and management.     Assessment & Plan   Principal Problem:   Osteomyelitis of lumbar vertebra (HCC) Active Problems:   Severe sepsis (HCC)   Bilateral hydronephrosis   Acute pyelonephritis   Acute urinary retention   Lumbar compression fracture (HCC)   Lumbar discitis   Diabetes mellitus type 2, uncomplicated (HCC)   Pressure injury of coccygeal region, stage 2 (HCC)   Acute metabolic encephalopathy   AKI (acute kidney injury) ([place])   Malnutrition of moderate degree   Dementia due to Parkinson's disease without behavioral disturbance ([place])   Essential hypertension   Acquired hypothyroidism   Lumbar Osteomyelitis / Discitis / Epidural Abscess / Right psoas abscess - obtained MRI lumbar spine due to ongoing urinary retention, no  sign of cauda equina syndrome, but appears to have extensive infection involving levels L3-L4.  ID consulted.   IR did CT-guided aspiration of the psoas abscess, cultures growing Proteus. Will need 6 weeks IV Rocephin 2 g every 24 hours.  Monitor ESR/CRP. --Flush percutaneous drain with 10 cc sterile saline daily. --Continue Rocephin --Place order for PICC line  Severe sepsis secondary to acute pyelonephritis and bacteremia - present on admission with leukocytosis, tachycardia, hypotension that was fluid responsive, lactic acidosis and AKI.  Imaging findings and UA are consistent with pyelonephritis.   Sepsis physiology has improved and lactic acidosis resolved.  Blood cultures grew Proteus and Enterobacteriaceae spp.   Abscess cultures as above, now on long course Rocephin.    Bilateral hydronephrosis secondary to acute urinary retention -present on admission, CT abdomen showed distended bladder and bilateral hydronephrosis.  Foley catheter removed this AM for voiding trial.  Foley replaced 8/1 (almost 800 cc on bladder scan).  Flomax started 7/29.  MRI lumbar ruled out cauda equina syndrome.   Urology consulted.  Recommend keeping the Foley catheter in place and voiding trial as outpatient in about 2 weeks.  Acute kidney injury - Resolved with Foley and IV fluids.  Present on admission with creatinine 2.43, prior baseline 0.69.  Likely multifactorial with sepsis and hypotension causing prerenal azotemia and post renal secondary to hydronephrosis and acute urinary retention.   Improved with Foley and IVF's.  Foley d/c'd for voiding trial, but replaced on 8/1 due to persistent retention.   Daily BMP to monitor.  Avoid nephrotoxins and hypotension.  Lumbar compression fractures- present on admission, seen on CT abdomen/pelvis in the ED, showed compression fractures of the superior endplate of L4 and inferior endplate of L3, likely acute.  Patient has not complained of back pain throughout  admission.  MRI obtained, showed infection as above.  No neurosurgery consult for now given infection at that level.    Acute metabolic encephalopathy - Present on admission likely secondary to infection on top of baseline dementia.  Resolved with treatment of infection.    Transaminitis -present on admission, improving.  AST 271>>103, ALT 24>>64, alk phos 137>>94, normal total bili 1.2>>1.0.  RUQ showed liver echogenicity indicative of fatty infiltration, no focal liver lesions, gallbladder absent..  Suspect due to severe sepsis and shock liver.  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  Continue Sinemet, ropinirole  Essential hypertension -not on antihypertensives outpatient  Acquired hypothyroidism -continue levothyroxine  Hyperlipidemia -continue simvastatin  Depression/anxiety -continue Zoloft, mirtazapine, Celexa, and Xanax  Insomnia -continue melatonin   Pressure Injury -   Pressure Injury 08/20/19 Coccyx Medial Stage 2 -  Partial thickness loss of dermis presenting as a shallow open injury with a red, pink wound bed without slough. (Active)  08/20/19 0500  Location: Coccyx  Location Orientation: Medial  Staging: Stage 2 -  Partial thickness loss of dermis presenting as a shallow open injury with a red, pink wound bed without slough.  Wound Description (Comments):   Present on Admission: Yes      Patient BMI: Body mass index is 28.43 kg/m.   DVT prophylaxis:    Diet:  Diet Orders (From admission, onward)    Start     Ordered   08/26/19 1333  Diet regular Room service appropriate? Yes; Fluid consistency: Thin  Diet effective now       Question Answer Comment  Room service appropriate? Yes   Fluid consistency: Thin      08/26/19 1332            Code Status: Full Code    Subjective 08/26/19    Patient seen at bedside this morning.  No acute events reported overnight.  Says  she is feeling better today.  Just continues to be very tired and weak feeling.  Denies fevers or chills, chest pain or shortness of breath, nausea vomiting diarrhea or other acute complaints.    Disposition Plan & Communication   Status is: Inpatient  Remains inpatient appropriate because:IV treatments appropriate due to intensity of illness or inability to take PO.  Continues to have acute urinary retention after voiding trial.  Urology consult pending.  Spinal infection with cultures pending.  Dispo: The patient is from: SN[facility])              Anticipated d/c is to: SN[facility])              Anticipated d/c date is: 1-2 days              Patient currently is not medically stable to d/c.   Family Communication:  A/P: Spoke with friend, Vaughan Basta, by phone this afternoon at patient's request.  She and patient's sister do not want her to return to [facility], feel she has not been well cared for there.  Has been to [facility] in the past, and had positive experience there.  Vaughan Basta says patient was ambulatory prior to her admission that led to d/c to rehab at [facility].  Consults, Procedures, Significant Events   Consultants:   None  Procedures:   Foley placed in the ER 7/27, discontinued 7/30 for void trial  Foley replaced 8/1  8/2 CT guided drain catheter placed in right psoas abscess, aspirated for culture  Antimicrobials:   Rocephin 7/27 >> 7/30  Ampicillin 7/30 >>8/3  Rocephin 8/3 >>       Objective   Vitals:   08/26/19 0428 08/26/19 0900 08/26/19 1100 08/26/19 1700  BP: (!) 123/45     Pulse: 71     Resp:      Temp: 98 F (36.7 C) (!) 97.5 F (36.4 C) 97.7 F (36.5 C) 98.3 F (36.8 C)  TempSrc:  Oral Oral Oral  SpO2: 95%     Weight: 75.1 kg     Height:        Intake/Output Summary (Last 24 hours) at 08/26/2019 1729 Last data filed at 08/26/2019 1722 Gross per 24 hour  Intake 403.73 ml  Output 1775 ml  Net -1371.27 ml   Filed  Weights   08/23/19 0300 08/25/19 0437 08/26/19 0428  Weight: 74.6 kg 74.4 kg 75.1 kg    Physical Exam:  General exam: Sleeping, wakes to voice, no acute distress, obese HEENT: Poor dentition, hearing grossly normal Respiratory system: CTAB, respiratory effort. Cardiovascular system: normal S1/S2, RRR, no pitting edema.  Neurologic: No gross focal deficits, normal speech, A&O x4 Extremities: moves all, no cyanosis, normal tone Psychiatry: normal mood, flat affect  Labs   Data Reviewed: I have personally reviewed following labs and imaging studies  CBC: Recent Labs  Lab 08/22/19 0608 08/23/19 0545 08/24/19 1035 08/25/19 0910 08/26/19 0538  WBC 17.4* 19.1* 13.4* 11.3* 11.2*  NEUTROABS 14.8* 16.6* 10.8* 9.0* 8.9*  HGB 10.5* 11.1* 10.5* 10.0* 10.5*  HCT 30.7* 33.8* 30.9* 30.3* 30.8*  MCV 91.4 94.7 92.2 93.8 93.1  PLT 251 264 269 277 449   Basic Metabolic Panel: Recent Labs  Lab 08/21/19 0548 08/21/19 0548 08/22/19 0608 08/23/19 0545 08/24/19 1035 08/25/19 0910 08/26/19 0538  NA 140   < > 142 139 136 136 138  K 4.3   < > 4.2 4.3 4.1 3.9 3.9  CL 114*   < > 116* 108 107 107 106  CO2 18*   < > 19* 21* 20* 23 24  GLUCOSE 117*   < > 108* 110* 135* 110* 145*  BUN 48*   < > 33* 40* 31* 25* 23  CREATININE 1.09*   < > 0.84 1.21* 0.76 0.65 0.70  CALCIUM 7.7*   < > 8.0* 8.1* 7.8* 7.6* 8.1*  MG 1.8  --   --   --  1.6* 1.7  --    < > = values in this interval not displayed.   GFR: Estimated Creatinine Clearance: 65 mL/min (by C-G formula based on SCr of 0.7 mg/dL). Liver Function Tests: Recent Labs  Lab 08/19/19 1858 08/21/19 0548 08/24/19 1035  AST 271* 103* 101*  ALT 24 64* 20  ALKPHOS 137* 94 93  BILITOT 1.2 1.0 0.8  PROT 7.8 6.2* 6.5  ALBUMIN 2.6* 1.8* 1.8*   No results for input(s): LIPASE, AMYLASE in the last 168 hours. No results for input(s): AMMONIA in the last 168 hours. Coagulation Profile: Recent Labs  Lab 08/20/19 0420  INR 1.3*   Cardiac  Enzymes: Recent Labs  Lab 08/19/19 1858  CKTOTAL 22*   BNP (last 3 results) No results for input(s): PROBNP in the last 8760 hours. HbA1C: No results  for input(s): HGBA1C in the last 72 hours. CBG: Recent Labs  Lab 08/25/19 1555 08/25/19 2151 08/26/19 0827 08/26/19 1120 08/26/19 1704  GLUCAP 153* 151* 127*  127* 106* 106*   Lipid Profile: No results for input(s): CHOL, HDL, LDLCALC, TRIG, CHOLHDL, LDLDIRECT in the last 72 hours. Thyroid Function Tests: No results for input(s): TSH, T4TOTAL, FREET4, T3FREE, THYROIDAB in the last 72 hours. Anemia Panel: No results for input(s): VITAMINB12, FOLATE, FERRITIN, TIBC, IRON, RETICCTPCT in the last 72 hours. Sepsis Labs: Recent Labs  Lab 08/19/19 1858 08/20/19 0420  PROCALCITON 4.62 3.30  LATICACIDVEN 2.5* 1.9    Recent Results (from the past 240 hour(s))  Blood Culture (routine x 2)     Status: Abnormal   Collection Time: 08/19/19  6:59 PM   Specimen: BLOOD  Result Value Ref Range Status   Specimen Description   Final    BLOOD RIGHT ANTECUBITAL Performed at [facility], [address] [place], [place] [address]    Special Requests   Final    BOTTLES DRAWN AEROBIC AND ANAEROBIC Blood Culture adequate volume Performed at [facility], [address] [place], [place] [address]    Culture  Setup Time   Final    GRAM NEGATIVE RODS IN BOTH AEROBIC AND ANAEROBIC BOTTLES CRITICAL VALUE NOTED.  VALUE IS CONSISTENT WITH PREVIOUSLY REPORTED AND CALLED VALUE. Performed at [facility], [address] [place], [place] [address]    Culture (A)  Final    PROTEUS MIRABILIS SUSCEPTIBILITIES PERFORMED ON PREVIOUS CULTURE WITHIN THE LAST 5 DAYS. Performed at [facility], [place] [address] [place], [place] [address]    Report Status 08/22/2019 FINAL  Final  Blood Culture (routine x 2)     Status: Abnormal   Collection Time: 08/19/19  7:03 PM   Specimen: BLOOD  Result Value Ref Range Status    Specimen Description   Final    BLOOD LEFT ANTECUBITAL Performed at [facility], [address] [place], [place] [address]    Special Requests   Final    BOTTLES DRAWN AEROBIC AND ANAEROBIC Blood Culture adequate volume Performed at [facility], [address], [place],  [address]    Culture  Setup Time   Final    GRAM NEGATIVE RODS IN BOTH AEROBIC AND ANAEROBIC BOTTLES CRITICAL RESULT CALLED TO, READ BACK BY AND VERIFIED WITH: MYRA SLAUGHTER AT 0824 ON 08/20/2019 [place]. Performed at [facility], [place] [address], [place], [place] [address]    Culture PROTEUS MIRABILIS (A)  Final   Report Status 08/22/2019 FINAL  Final   Organism ID, Bacteria PROTEUS MIRABILIS  Final      Susceptibility   Proteus mirabilis - MIC*    AMPICILLIN <=2 SENSITIVE Sensitive     CEFAZOLIN <=4 SENSITIVE Sensitive     CEFEPIME <=0.12 SENSITIVE Sensitive     CEFTAZIDIME <=1 SENSITIVE Sensitive     CEFTRIAXONE <=0.25 SENSITIVE Sensitive     CIPROFLOXACIN 0.5 SENSITIVE Sensitive     GENTAMICIN <=1 SENSITIVE Sensitive     IMIPENEM 2 SENSITIVE Sensitive     TRIMETH/SULFA <=20 SENSITIVE Sensitive     AMPICILLIN/SULBACTAM <=2 SENSITIVE Sensitive     PIP/TAZO <=4 SENSITIVE Sensitive     * PROTEUS MIRABILIS  Blood Culture ID Panel (Reflexed)     Status: Abnormal   Collection Time: 08/19/19  7:03 PM  Result Value Ref Range Status   Enterococcus species NOT DETECTED NOT DETECTED Final  Listeria monocytogenes NOT DETECTED NOT DETECTED Final   Staphylococcus species NOT DETECTED NOT DETECTED Final   Staphylococcus aureus (BCID) NOT DETECTED NOT DETECTED Final   Streptococcus species NOT DETECTED NOT DETECTED Final   Streptococcus agalactiae NOT DETECTED NOT DETECTED Final   Streptococcus pneumoniae NOT DETECTED NOT DETECTED Final   Streptococcus pyogenes NOT DETECTED NOT DETECTED Final   Acinetobacter baumannii NOT DETECTED NOT DETECTED Final   Enterobacteriaceae species DETECTED  (A) NOT DETECTED Final    Comment: Enterobacteriaceae represent a large family of gram-negative bacteria, not a single organism. CRITICAL RESULT CALLED TO, READ BACK BY AND VERIFIED WITH: MYRA SLAUGHTER AT 7619 ON 08/20/2019 [place].    Enterobacter cloacae complex NOT DETECTED NOT DETECTED Final   Escherichia coli NOT DETECTED NOT DETECTED Final   Klebsiella oxytoca NOT DETECTED NOT DETECTED Final   Klebsiella pneumoniae NOT DETECTED NOT DETECTED Final   Proteus species DETECTED (A) NOT DETECTED Final    Comment: CRITICAL RESULT CALLED TO, READ BACK BY AND VERIFIED WITH: MYRA SLAUGHTER AT 0824 ON 08/20/2019 [place].    Serratia marcescens NOT DETECTED NOT DETECTED Final   Carbapenem resistance NOT DETECTED NOT DETECTED Final   Haemophilus influenzae NOT DETECTED NOT DETECTED Final   Neisseria meningitidis NOT DETECTED NOT DETECTED Final   Pseudomonas aeruginosa NOT DETECTED NOT DETECTED Final   Candida albicans NOT DETECTED NOT DETECTED Final   Candida glabrata NOT DETECTED NOT DETECTED Final   Candida krusei NOT DETECTED NOT DETECTED Final   Candida parapsilosis NOT DETECTED NOT DETECTED Final   Candida tropicalis NOT DETECTED NOT DETECTED Final    Comment: Performed at [facility], [address], [place], [place] [address]  Urine culture     Status: Abnormal   Collection Time: 08/19/19  9:15 PM   Specimen: In/Out Cath Urine  Result Value Ref Range Status   Specimen Description   Final    IN/OUT CATH URINE Performed at [facility], [address], [place], [place] [address]    Special Requests   Final    NONE Performed at [facility], [place], [place] [address]    Culture >=100,000 COLONIES/mL PROTEUS MIRABILIS (A)  Final   Report Status 08/22/2019 FINAL  Final   Organism ID, Bacteria PROTEUS MIRABILIS (A)  Final      Susceptibility   Proteus mirabilis - MIC*    AMPICILLIN <=2 SENSITIVE Sensitive     CEFAZOLIN <=4 SENSITIVE Sensitive      CEFTRIAXONE <=0.25 SENSITIVE Sensitive     CIPROFLOXACIN <=0.25 SENSITIVE Sensitive     GENTAMICIN <=1 SENSITIVE Sensitive     IMIPENEM 2 SENSITIVE Sensitive     NITROFURANTOIN 128 RESISTANT Resistant     TRIMETH/SULFA <=20 SENSITIVE Sensitive     AMPICILLIN/SULBACTAM <=2 SENSITIVE Sensitive     PIP/TAZO <=4 SENSITIVE Sensitive     * >=100,000 COLONIES/mL PROTEUS MIRABILIS  SARS Coronavirus 2 by RT PCR (hospital order, performed in [place] hospital lab) Nasopharyngeal Nasopharyngeal Swab     Status: None   Collection Time: 08/19/19  9:15 PM   Specimen: Nasopharyngeal Swab  Result Value Ref Range Status   SARS Coronavirus 2 NEGATIVE NEGATIVE Final    Comment: (NOTE) SARS-CoV-2 target nucleic acids are NOT DETECTED.  The SARS-CoV-2 RNA is generally detectable in upper and lower respiratory specimens during the acute phase of infection. The lowest concentration of SARS-CoV-2 viral copies this assay can detect is 250 copies / mL. A negative result does not  preclude SARS-CoV-2 infection and should not be used as the sole basis for treatment or other patient management decisions.  A negative result may occur with improper specimen collection / handling, submission of specimen other than nasopharyngeal swab, presence of viral mutation(s) within the areas targeted by this assay, and inadequate number of viral copies (<250 copies / mL). A negative result must be combined with clinical observations, patient history, and epidemiological information.  Fact Sheet for Patients:   StrictlyIdeas.no  Fact Sheet for Healthcare Providers: BankingDealers.co.za  This test is not yet approved or  cleared by the [place] FDA and has been authorized for detection and/or diagnosis of SARS-CoV-2 by FDA under an Emergency Use Authorization (EUA).  This EUA will remain in effect (meaning this test can be used) for the duration of the COVID-19  declaration under Section 564(b)(1) of the Act, 21 U.S.C. section 360bbb-3(b)(1), unless the authorization is terminated or revoked sooner.  Performed at [facility], [address] [place], [place] [address]   MRSA PCR Screening     Status: None   Collection Time: 08/20/19  5:55 AM   Specimen: Nasopharyngeal  Result Value Ref Range Status   MRSA by PCR NEGATIVE NEGATIVE Final    Comment:        The GeneXpert MRSA Assay (FDA approved for NASAL specimens only), is one component of a comprehensive MRSA colonization surveillance program. It is not intended to diagnose MRSA infection nor to guide or monitor treatment for MRSA infections. Performed at [facility], [address] [place], [place] [address]   CULTURE, BLOOD (ROUTINE X 2) w Reflex to ID Panel     Status: None (Preliminary result)   Collection Time: 08/23/19 10:33 AM   Specimen: BLOOD  Result Value Ref Range Status   Specimen Description BLOOD LAC  Final   Special Requests   Final    BOTTLES DRAWN AEROBIC AND ANAEROBIC Blood Culture adequate volume   Culture   Final    NO GROWTH 3 DAYS Performed at [facility], [address] [place], [place] [address]    Report Status PENDING  Incomplete  CULTURE, BLOOD (ROUTINE X 2) w Reflex to ID Panel     Status: None (Preliminary result)   Collection Time: 08/23/19 10:34 AM   Specimen: BLOOD  Result Value Ref Range Status   Specimen Description BLOOD LFA  Final   Special Requests   Final    BOTTLES DRAWN AEROBIC AND ANAEROBIC Blood Culture adequate volume   Culture   Final    NO GROWTH 3 DAYS Performed at [facility], [address], [place], [place] [address]    Report Status PENDING  Incomplete  Aerobic/Anaerobic Culture (surgical/deep wound)     Status: None (Preliminary result)   Collection Time: 08/25/19 12:52 PM   Specimen: Abscess  Result Value Ref Range Status   Specimen Description ABSCESS  Final   Special  Requests post CT guided paraspinal drain  Final   Gram Stain   Final    ABUNDANT WBC PRESENT, PREDOMINANTLY PMN NO ORGANISMS SEEN Performed at [facility], [place] [address], [place], [place] [address]    Culture FEW PROTEUS MIRABILIS  Final   Report Status PENDING  Incomplete      Imaging Studies   MR LUMBAR SPINE WO CONTRAST  Result Date: 08/24/2019 CLINICAL DATA:  Initial evaluation for compression fracture. EXAM: MRI LUMBAR SPINE WITHOUT CONTRAST TECHNIQUE: Multiplanar, multisequence MR imaging of the lumbar spine  was performed. No intravenous contrast was administered. COMPARISON:  Prior CT from 08/19/2019. FINDINGS: Segmentation: Standard. Lowest well-formed disc space labeled the L5-S1 level. Alignment: Moderate levoscoliosis with apex at L2. Chronic bilateral pars defects at L5 with associated 8 mm spondylolisthesis of L5 on S1. Trace 2 mm retrolisthesis of L1 on L2 and L2 on L3. Vertebrae: There is abnormal fluid signal intensity within the L3-4 interspace with associated marrow edema within the adjacent endplates of L3 and L4. Associated paraspinous edema within the adjacent psoas musculature. Findings concerning for possible osteomyelitis discitis. Previously identified superimposed compression fracture at the superior endplate of L4 again seen, associated height loss measures up to 40% with 3 mm bony retropulsion. Additional acute to subacute compression fracture extending through the inferior endplate of L3 with mild 25% central height loss and trace 2 mm bony retropulsion. Mild height loss at the superior endplate of N82 is chronic in appearance. Vertebral body height otherwise maintained. Additionally, there is abnormal marrow edema about the right greater than left L3-4 facets with associated small joint effusions and adjacent paraspinous edema. While these findings could be degenerative, associated septic arthritis could also be present. Evidence for epidural collection involving  the ventral and right posterolateral epidural space at the level of L3-4, most likely epidural abscess (series 15, images 24, 27). Additionally, fluid signal intensity seen within the L2-3 interspace, favored to be degenerative, although possible discitis at this level not excluded. Additional more mild fluid signal intensity noted superiorly within the L1-2 interspace as well, favored to be degenerative. Underlying bone marrow signal intensity otherwise within normal limits. Multiple scattered benign hemangiomata noted. No worrisome osseous lesions. Conus medullaris and cauda equina: Conus extends to the L2 level. Conus and cauda equina appear normal. Paraspinal and other soft tissues: Abnormal edema seen within the right greater than left psoas musculature bilaterally. Superimposed loculated collection within the right psoas muscle measures 1.7 x 1.7 x 4.9 cm (series 15, image 31), most likely reflecting abscess. Scattered fat stranding noted within the adjacent retroperitoneum. Soft tissue edema also noted within the posterior paraspinous musculature, most pronounced about the L3-4 facets. Partially visualized visceral structures grossly unremarkable. Disc levels: T12-L1: Mild left eccentric disc bulge. Superimposed right extraforaminal disc protrusion (series 15, image 8). Mild facet hypertrophy. No significant stenosis. L1-2: Diffuse disc bulge with disc desiccation and intervertebral disc space narrowing. Associated reactive endplate changes with marginal endplate spurring superimposed more focal right extraforaminal disc osteophyte complex (series 15, image 15). Mild to moderate facet hypertrophy, greater on the left. No significant spinal stenosis. Foramina remain patent. L2-3: Advanced intervertebral disc space narrowing with diffuse disc bulge and disc desiccation. Disc bulging asymmetric to the right with exuberant right-sided reactive endplate spurring. Fluid signal intensity within the central and right  aspect of the disc space favored to be degenerative, although changes related to discitis not entirely excluded. Moderate bilateral facet hypertrophy. Small amount of epidural abscess seen at the right posterior epidural space, tracking inferiorly from the L3-4 level (series 15, image 22). Resultant moderate spinal stenosis with right greater than left lateral recess narrowing. Mild bilateral L2 foraminal stenosis. L3-4: Findings concerning for osteomyelitis discitis with adjacent endplate compression fractures. Superimposed mild disc bulge and/or epidural abscess within the ventral epidural space. Epidural abscess extends to involve the right posterior aspect of the thecal sac. Superimposed moderate facet hypertrophy with associated joint effusions and probable septic arthritis. Resultant moderate to severe spinal stenosis. Thecal sac measures 8 mm in AP diameter at its most  narrow point. Moderate right greater than left L3 foraminal narrowing. L4-5: Disc desiccation with minimal disc bulge. Superimposed shallow left extraforaminal disc protrusion. Moderate facet hypertrophy. No spinal stenosis. Foramina remain patent. L5-S1: Chronic bilateral pars defects with associated 8 mm spondylolisthesis. Moderate right worse than left facet hypertrophy. No spinal stenosis. Mild right with moderate to severe left L5 foraminal stenosis. IMPRESSION: 1. Findings concerning for osteomyelitis discitis involving the L3-4 interspace as above. Associated epidural abscess at the L3-4 level with resultant moderate to severe spinal stenosis. 2. Abnormal edema with joint effusions involving the bilateral L3-4 facets, concerning for concomitant septic arthritis. 3. Associated paraspinous inflammation centered about the L3-4 interspace with superimposed 1.7 x 1.7 x 4.9 cm collection within the right psoas muscle, likely abscess. 4. Superimposed acute to subacute compression deformities involving the inferior endplate of Q6-83% height loss  with 2 mm bony retropulsion, and superior endplate of M1-96% height loss with 3 mm bony retropulsion. 5. Additional fluid signal intensity within the L1-2 and L2-3 interspaces, favored to be degenerative, although additional levels of discitis somewhat difficult to exclude. Attention at follow-up recommended. 6. Chronic bilateral pars defects at L5-S1 with associated 8 mm spondylolisthesis of L5 on S1 with moderate to severe left L5 foraminal stenosis. 7. Underlying moderate levoscoliosis with additional moderate multilevel degenerative spondylosis elsewhere as above. Results were called by telephone at the time of interpretation on 08/24/2019 at 10:49 pm to provider Sharion Settler, NP, who verbally acknowledged these results. Electronically Signed   By: Jeannine Boga M.D.   On: 08/24/2019 22:51   CT BIOPSY  Result Date: 08/25/2019 INDICATION: Right-sided iliopsoas abscess. Please perform CT-guided aspiration and/or drainage catheter placement for diagnostic and therapeutic purposes. EXAM: CT-GUIDED RIGHT ILIOPSOAS ABSCESS DRAINAGE CATHETER PLACEMENT COMPARISON:  Lumbar spine MRI - 08/24/2019 MEDICATIONS: The patient is currently admitted to the hospital and receiving intravenous antibiotics. The antibiotics were administered within an appropriate time frame prior to the initiation of the procedure. ANESTHESIA/SEDATION: Moderate (conscious) sedation was employed during this procedure. A total of Versed 0.5 mg and Fentanyl 25 mcg was administered intravenously. Moderate Sedation Time: 15 minutes. The patient's level of consciousness and vital signs were monitored continuously by radiology nursing throughout the procedure under my direct supervision. CONTRAST:  None COMPLICATIONS: None immediate. PROCEDURE: Informed written consent was obtained from the patient after a discussion of the risks, benefits and alternatives to treatment. The patient was placed left lateral decubitus on the CT gantry and a pre  procedural CT was performed re-demonstrating the known ill-defined abscess/fluid collection within the right iliopsoas musculature with dominant component measuring approximately 2.1 x 1.7 cm (image 28, series 2). The procedure was planned. A timeout was performed prior to the initiation of the procedure. The skin overlying the posterior aspect the right lower back was prepped and draped in the usual sterile fashion. The overlying soft tissues were anesthetized with 1% lidocaine with epinephrine. Appropriate trajectory was planned with the use of a 22 gauge spinal needle. An 18 gauge trocar needle was advanced into the abscess/fluid collection and a short Amplatz super stiff wire was coiled within the collection. Appropriate positioning was confirmed with a limited CT scan. The tract was serially dilated allowing placement of a 10 [place] all-purpose drainage catheter. Appropriate positioning was confirmed with a limited postprocedural CT scan. Approximately 10 ml of bloody, purulent fluid was aspirated. The tube was connected to a JP bulb and sutured in place. A dressing was placed. The patient tolerated the procedure well without immediate  post procedural complication. IMPRESSION: Successful CT guided placement of a 10 French all purpose drain catheter into the right iliopsoas abscess with aspiration of 10 mL of bloody, purulent fluid. Samples were sent to the laboratory as requested by the ordering clinical team. PLAN: - flush the percutaneous drainage catheter with 10 cc normal saline b.i.d. - Maintain diligent records regarding daily drainage catheter output (excluding flush volumes). - Once drainage catheter output is less than 10 cc per day the drainage catheter may be removed with or without obtaining contrast-enhanced lumbar spine CT prior to removal. Note, drainage catheter does NOT require injection prior to removal. Electronically Signed   By: Sandi Mariscal M.D.   On: 08/25/2019 14:16     Medications    Scheduled Meds: . ALPRAZolam  0.25 mg Oral q morning - 10a  . calcium-vitamin D  1 tablet Oral Daily  . carbidopa-levodopa  1 tablet Oral BID  . Chlorhexidine Gluconate Cloth  6 each Topical Q0600  . citalopram  40 mg Oral Daily  . docusate sodium  100 mg Oral BID  . enoxaparin (LOVENOX) injection  40 mg Subcutaneous Q24H  . feeding supplement (ENSURE ENLIVE)  237 mL Oral TID BM  . insulin aspart  0-15 Units Subcutaneous TID WC  . insulin aspart  0-5 Units Subcutaneous QHS  . levothyroxine  175 mcg Oral Q0600  . megestrol  40 mg Oral Daily  . mirtazapine  7.5 mg Oral QHS  . multivitamin with minerals  1 tablet Oral Daily  . pantoprazole  40 mg Oral Daily  . rOPINIRole  0.5 mg Oral BID  . rOPINIRole  1 mg Oral QHS  . sertraline  50 mg Oral Daily  . simvastatin  40 mg Oral QHS  . sodium chloride flush  5 mL Intracatheter Q8H  . tamsulosin  0.4 mg Oral QPC supper   Continuous Infusions: . sodium chloride 10 mL/hr at 08/26/19 1515  . cefTRIAXone (ROCEPHIN)  IV         LOS: 7 days    Time spent: 30 minutes   Ezekiel Slocumb, DO [facility]  08/26/2019, 5:29 PM    If 7PM-7AM, please contact night-coverage. How to contact the [facility] Attending or Consulting provider [place] or covering provider during after hours [place], for this patient?    1. Check the care team in [facility] and look for a) attending/consulting [facility] provider listed and b) the [facility] team listed 2. Log into [email or web address] and use [place]'s universal password to access. If you do not have the password, please contact the hospital operator. 3. Locate the [facility] provider you are looking for under [facility] and page to a number that you can be directly reached. 4. If you still have difficulty reaching the provider, please page the [facility] (Director on Call) for the Hospitalists listed on amion for assistance.

## 2019-08-26 NOTE — Consult Note (Signed)
Urology Consult  I have been asked to see the patient by Dr. Arbutus Ped, for evaluation and management of urinary retention.  Chief Complaint: SOB, AMS  History of Present Illness: Theresa Mills is a 69 y.o. year old female with PMH DM, OSA, Parkinson's dementia, and frequent falls admitted on 08/19/2019 with acute left pyelonephritis and urosepsis with Macrobid-resistant Proteus-positive blood and urine cultures, lumbar osteomyelitis and discitis with imaging reassuring for cauda equina, and acute urinary retention with bilateral hydronephrosis and AKI. Foley placed on admission; on antibiotics as below.  Admission CT AP without contrast notable for mild bilateral hydronephrosis, L>R, multiple nonobstructing left renal calculi, left perinephric stranding consistent with pyelonephritis, and distended urinary bladder with bladder wall haziness and perivesical stranding. Admission UA with >50 WBCs/hpf, 21-50 RBCs/hpf, rare bacteria, and WBC clumps.  Inpatient voiding trial attempted yesterday; Foley replaced with bladder scan 834mL and patient unable to void.  Patient appears altered today; unclear if she is a reliable historian. She reports "months" of "occasional" difficulty urinating "like everyone has." She reports dysuria and gross hematuria associated with her current illness but not prior. No urologic notes per chart review, however she states she may have seen a urologist "many years ago" for urinary discoloration, unclear if this represents hematuria. She denies a history of urologic surgery.  Foley in place today draining yellow urine with purulent sediment.  Creatinine stable, 0.70.  WBC count stable, 11.2.  Anti-infectives (From admission, onward)   Start     Dose/Rate Route Frequency Ordered Stop   08/24/19 2315  clindamycin (CLEOCIN) IVPB 900 mg  Status:  Discontinued        900 mg 100 mL/hr over 30 Minutes Intravenous Every 8 hours 08/24/19 2312 08/25/19 0657   08/24/19 2315   ceFAZolin (ANCEF) IVPB 2g/100 mL premix     Discontinue     2 g 200 mL/hr over 30 Minutes Intravenous Every 8 hours 08/24/19 2312     08/23/19 0600  ampicillin (OMNIPEN) 2 g in sodium chloride 0.9 % 100 mL IVPB  Status:  Discontinued        2 g 300 mL/hr over 20 Minutes Intravenous Every 6 hours 08/22/19 1132 08/24/19 2312   08/20/19 2000  cefTRIAXone (ROCEPHIN) 1 g in sodium chloride 0.9 % 100 mL IVPB  Status:  Discontinued        1 g 200 mL/hr over 30 Minutes Intravenous Every 24 hours 08/19/19 2204 08/20/19 0850   08/20/19 1000  cefTRIAXone (ROCEPHIN) 2 g in sodium chloride 0.9 % 100 mL IVPB  Status:  Discontinued        2 g 200 mL/hr over 30 Minutes Intravenous Every 24 hours 08/20/19 0850 08/22/19 1132   08/19/19 2030  cefTRIAXone (ROCEPHIN) 1 g in sodium chloride 0.9 % 100 mL IVPB        1 g 200 mL/hr over 30 Minutes Intravenous  Once 08/19/19 2020 08/19/19 2314     Past Medical History:  Diagnosis Date  . Anxiety   . Diabetes ([place])   . Difficult intravenous access   . GERD (gastroesophageal reflux disease)   . HBP (high blood pressure)   . Hyperlipidemia   . Osteoarthritis   . Parkinson disease ([place])   . Sleep apnea   . Thyroid disease     Past Surgical History:  Procedure Laterality Date  . ABDOMINAL HYSTERECTOMY    . CATARACT EXTRACTION Bilateral   . CHOLECYSTECTOMY    . COLONOSCOPY WITH PROPOFOL N/A  04/02/2015   Procedure: COLONOSCOPY WITH PROPOFOL;  Surgeon: Josefine Class, MD;  Location[facility] ENDOSCOPY;  Service: Endoscopy;  Laterality: N/A;  . ESOPHAGOGASTRODUODENOSCOPY (EGD) WITH PROPOFOL N/A 04/02/2015   Procedure: ESOPHAGOGASTRODUODENOSCOPY (EGD) WITH PROPOFOL;  Surgeon: Josefine Class, MD;  Location: [facility] ENDOSCOPY;  Service: Endoscopy;  Laterality: N/A;  . GALLBLADDER SURGERY    . LAPAROSCOPIC HYSTERECTOMY    . THYROIDECTOMY Left   . TONSILLECTOMY    . TONSILLECTOMY Bilateral     Home Medications:  Current Meds  Medication Sig  . ALPRAZolam  (XANAX) 0.25 MG tablet Take 1 tablet (0.25 mg total) by mouth every morning.  . Calcium Carbonate-Vitamin D (CALCIUM HIGH POTENCY/VITAMIN D) 600-200 MG-UNIT TABS Take 1 tablet by mouth daily.   . carbidopa-levodopa (SINEMET CR) 50-200 MG tablet Take 1 tablet by mouth 2 (two) times daily.   . carbidopa-levodopa (SINEMET IR) 25-100 MG tablet Take 2 tablets by mouth 4 (four) times daily.  . celecoxib (CELEBREX) 200 MG capsule Take 1 capsule (200 mg total) by mouth 2 (two) times daily as needed.  . citalopram (CELEXA) 40 MG tablet Take 40 mg by mouth daily.  Marland Kitchen docusate sodium (COLACE) 100 MG capsule Take 1 capsule (100 mg total) by mouth 2 (two) times daily.  Marland Kitchen levothyroxine (SYNTHROID) 175 MCG tablet Take 1 tablet (175 mcg total) by mouth daily.  . metFORMIN (GLUCOPHAGE) 500 MG tablet Take 500 mg by mouth 2 (two) times daily with a meal.   . Multiple Vitamins-Minerals (MULTIVITAMIN WOMEN 50+ PO) Take 1 tablet by mouth daily.   Marland Kitchen omeprazole (PRILOSEC) 40 MG capsule Take 1 capsule (40 mg total) by mouth daily.    Allergies:  Allergies  Allergen Reactions  . Codeine Other (See Comments)    HALLUCINATIONS  . Ephadrene [Cholestatin] Other (See Comments)    HYPER AND NERVOUS  . Other Other (See Comments)    HYPER  HYPER AND NERVOUS  . Valium [Diazepam] Other (See Comments)    OVERLY SENSITIVE/TOO STRONG    Family History  Problem Relation Age of Onset  . Asthma Father   . Cancer Sister   . Stroke Maternal Uncle   . Heart disease Maternal Uncle   . Diabetes Maternal Uncle     Social History:  reports that she has never smoked. She has never used smokeless tobacco. She reports that she does not drink alcohol and does not use drugs.  ROS: A complete review of systems was performed.  All systems are negative except for pertinent findings as noted.  Physical Exam:  Vital signs in last 24 hours: Temp:  [98 F (36.7 C)-98.2 F (36.8 C)] 98 F (36.7 C) (08/03 0428) Pulse Rate:  [59-75]  71 (08/03 0428) Resp:  [15-20] 16 (08/02 1317) BP: (99-123)/(45-71) 123/45 (08/03 0428) SpO2:  [91 %-100 %] 95 % (08/03 0428) Weight:  [75.1 kg] 75.1 kg (08/03 0428) Constitutional:  Awake, no acute distress HEENT: [place] AT, moist mucus membranes Cardiovascular: No clubbing, cyanosis, or edema Respiratory: Normal respiratory effort Skin: No rashes, bruises or suspicious lesions Neurologic: Unable to assess, lying still and supine in bed Psychiatric: Flat affect, brief but appropriate verbal responses lacking in detail  Laboratory Data:  Recent Labs    08/24/19 1035 08/25/19 0910 08/26/19 0538  WBC 13.4* 11.3* 11.2*  HGB 10.5* 10.0* 10.5*  HCT 30.9* 30.3* 30.8*   Recent Labs    08/24/19 1035 08/25/19 0910 08/26/19 0538  NA 136 136 138  K 4.1 3.9 3.9  CL 107 107 106  CO2 20* 23 24  GLUCOSE 135* 110* 145*  BUN 31* 25* 23  CREATININE 0.76 0.65 0.70  CALCIUM 7.8* 7.6* 8.1*   Urinalysis    Component Value Date/Time   COLORURINE AMBER (A) 08/19/2019 2105   APPEARANCEUR TURBID (A) 08/19/2019 2105   APPEARANCEUR Cloudy (A) 09/05/2017 1535   LABSPEC 1.011 08/19/2019 2105   LABSPEC 1.021 01/13/2014 0737   PHURINE 8.0 08/19/2019 2105   GLUCOSEU NEGATIVE 08/19/2019 2105   GLUCOSEU >=500 01/13/2014 0737   HGBUR LARGE (A) 08/19/2019 2105   BILIRUBINUR NEGATIVE 08/19/2019 2105   BILIRUBINUR negative 06/20/2019 1144   BILIRUBINUR Negative 09/05/2017 1535   BILIRUBINUR Negative 01/13/2014 [place] 08/19/2019 2105   PROTEINUR 100 (A) 08/19/2019 2105   UROBILINOGEN negative (A) 06/20/2019 1144   NITRITE NEGATIVE 08/19/2019 2105   LEUKOCYTESUR LARGE (A) 08/19/2019 2105   LEUKOCYTESUR 3+ 01/13/2014 0737   Results for orders placed or performed during the hospital encounter of 08/19/19  Blood Culture (routine x 2)     Status: Abnormal   Collection Time: 08/19/19  6:59 PM   Specimen: BLOOD  Result Value Ref Range Status   Specimen Description   Final    BLOOD  RIGHT ANTECUBITAL Performed at [facility], [address] [place], [place] [address]    Special Requests   Final    BOTTLES DRAWN AEROBIC AND ANAEROBIC Blood Culture adequate volume Performed at [facility], [address] [place], [place] [address]    Culture  Setup Time   Final    GRAM NEGATIVE RODS IN BOTH AEROBIC AND ANAEROBIC BOTTLES CRITICAL VALUE NOTED.  VALUE IS CONSISTENT WITH PREVIOUSLY REPORTED AND CALLED VALUE. Performed at [facility], [address] [place], [place] [address]    Culture (A)  Final    PROTEUS MIRABILIS SUSCEPTIBILITIES PERFORMED ON PREVIOUS CULTURE WITHIN THE LAST 5 DAYS. Performed at [facility], [place] [address], [place], [place] [address]    Report Status 08/22/2019 FINAL  Final  Blood Culture (routine x 2)     Status: Abnormal   Collection Time: 08/19/19  7:03 PM   Specimen: BLOOD  Result Value Ref Range Status   Specimen Description   Final    BLOOD LEFT ANTECUBITAL Performed at [facility], [address], [place], [place] [address]    Special Requests   Final    BOTTLES DRAWN AEROBIC AND ANAEROBIC Blood Culture adequate volume Performed at [facility], [address], [place], [place] [address]    Culture  Setup Time   Final    GRAM NEGATIVE RODS IN BOTH AEROBIC AND ANAEROBIC BOTTLES CRITICAL RESULT CALLED TO, READ BACK BY AND VERIFIED WITH: MYRA SLAUGHTER AT 0824 ON 08/20/2019 [place]. Performed at [facility], [place] [address] [place], [place] [address]    Culture PROTEUS MIRABILIS (A)  Final   Report Status 08/22/2019 FINAL  Final   Organism ID, Bacteria PROTEUS MIRABILIS  Final      Susceptibility   Proteus mirabilis - MIC*    AMPICILLIN <=2 SENSITIVE Sensitive     CEFAZOLIN <=4 SENSITIVE Sensitive     CEFEPIME <=0.12 SENSITIVE Sensitive     CEFTAZIDIME <=1 SENSITIVE Sensitive     CEFTRIAXONE <=0.25 SENSITIVE Sensitive     CIPROFLOXACIN 0.5 SENSITIVE Sensitive      GENTAMICIN <=1 SENSITIVE Sensitive     IMIPENEM 2 SENSITIVE Sensitive     TRIMETH/SULFA <=20 SENSITIVE  Sensitive     AMPICILLIN/SULBACTAM <=2 SENSITIVE Sensitive     PIP/TAZO <=4 SENSITIVE Sensitive     * PROTEUS MIRABILIS  Blood Culture ID Panel (Reflexed)     Status: Abnormal   Collection Time: 08/19/19  7:03 PM  Result Value Ref Range Status   Enterococcus species NOT DETECTED NOT DETECTED Final   Listeria monocytogenes NOT DETECTED NOT DETECTED Final   Staphylococcus species NOT DETECTED NOT DETECTED Final   Staphylococcus aureus (BCID) NOT DETECTED NOT DETECTED Final   Streptococcus species NOT DETECTED NOT DETECTED Final   Streptococcus agalactiae NOT DETECTED NOT DETECTED Final   Streptococcus pneumoniae NOT DETECTED NOT DETECTED Final   Streptococcus pyogenes NOT DETECTED NOT DETECTED Final   Acinetobacter baumannii NOT DETECTED NOT DETECTED Final   Enterobacteriaceae species DETECTED (A) NOT DETECTED Final    Comment: Enterobacteriaceae represent a large family of gram-negative bacteria, not a single organism. CRITICAL RESULT CALLED TO, READ BACK BY AND VERIFIED WITH: MYRA SLAUGHTER AT 9562 ON 08/20/2019 [place].    Enterobacter cloacae complex NOT DETECTED NOT DETECTED Final   Escherichia coli NOT DETECTED NOT DETECTED Final   Klebsiella oxytoca NOT DETECTED NOT DETECTED Final   Klebsiella pneumoniae NOT DETECTED NOT DETECTED Final   Proteus species DETECTED (A) NOT DETECTED Final    Comment: CRITICAL RESULT CALLED TO, READ BACK BY AND VERIFIED WITH: MYRA SLAUGHTER AT 0824 ON 08/20/2019 [place].    Serratia marcescens NOT DETECTED NOT DETECTED Final   Carbapenem resistance NOT DETECTED NOT DETECTED Final   Haemophilus influenzae NOT DETECTED NOT DETECTED Final   Neisseria meningitidis NOT DETECTED NOT DETECTED Final   Pseudomonas aeruginosa NOT DETECTED NOT DETECTED Final   Candida albicans NOT DETECTED NOT DETECTED Final   Candida glabrata NOT DETECTED NOT DETECTED Final    Candida krusei NOT DETECTED NOT DETECTED Final   Candida parapsilosis NOT DETECTED NOT DETECTED Final   Candida tropicalis NOT DETECTED NOT DETECTED Final    Comment: Performed at [facility], [address], [place], [place] [address]  Urine culture     Status: Abnormal   Collection Time: 08/19/19  9:15 PM   Specimen: In/Out Cath Urine  Result Value Ref Range Status   Specimen Description   Final    IN/OUT CATH URINE Performed at [facility], [address] [place], [place] [address]    Special Requests   Final    NONE Performed at [facility], [place], [place] [address]    Culture >=100,000 COLONIES/mL PROTEUS MIRABILIS (A)  Final   Report Status 08/22/2019 FINAL  Final   Organism ID, Bacteria PROTEUS MIRABILIS (A)  Final      Susceptibility   Proteus mirabilis - MIC*    AMPICILLIN <=2 SENSITIVE Sensitive     CEFAZOLIN <=4 SENSITIVE Sensitive     CEFTRIAXONE <=0.25 SENSITIVE Sensitive     CIPROFLOXACIN <=0.25 SENSITIVE Sensitive     GENTAMICIN <=1 SENSITIVE Sensitive     IMIPENEM 2 SENSITIVE Sensitive     NITROFURANTOIN 128 RESISTANT Resistant     TRIMETH/SULFA <=20 SENSITIVE Sensitive     AMPICILLIN/SULBACTAM <=2 SENSITIVE Sensitive     PIP/TAZO <=4 SENSITIVE Sensitive     * >=100,000 COLONIES/mL PROTEUS MIRABILIS  SARS Coronavirus 2 by RT PCR (hospital order, performed in [place] hospital lab) Nasopharyngeal Nasopharyngeal Swab     Status: None   Collection Time: 08/19/19  9:15 PM   Specimen: Nasopharyngeal Swab  Result Value Ref Range Status  SARS Coronavirus 2 NEGATIVE NEGATIVE Final    Comment: (NOTE) SARS-CoV-2 target nucleic acids are NOT DETECTED.  The SARS-CoV-2 RNA is generally detectable in upper and lower respiratory specimens during the acute phase of infection. The lowest concentration of SARS-CoV-2 viral copies this assay can detect is 250 copies / mL. A negative result does not preclude SARS-CoV-2  infection and should not be used as the sole basis for treatment or other patient management decisions.  A negative result may occur with improper specimen collection / handling, submission of specimen other than nasopharyngeal swab, presence of viral mutation(s) within the areas targeted by this assay, and inadequate number of viral copies (<250 copies / mL). A negative result must be combined with clinical observations, patient history, and epidemiological information.  Fact Sheet for Patients:   StrictlyIdeas.no  Fact Sheet for Healthcare Providers: BankingDealers.co.za  This test is not yet approved or  cleared by the [place] FDA and has been authorized for detection and/or diagnosis of SARS-CoV-2 by FDA under an Emergency Use Authorization (EUA).  This EUA will remain in effect (meaning this test can be used) for the duration of the COVID-19 declaration under Section 564(b)(1) of the Act, 21 U.S.C. section 360bbb-3(b)(1), unless the authorization is terminated or revoked sooner.  Performed at [facility], [address] [place], [place] [address]   MRSA PCR Screening     Status: None   Collection Time: 08/20/19  5:55 AM   Specimen: Nasopharyngeal  Result Value Ref Range Status   MRSA by PCR NEGATIVE NEGATIVE Final    Comment:        The GeneXpert MRSA Assay (FDA approved for NASAL specimens only), is one component of a comprehensive MRSA colonization surveillance program. It is not intended to diagnose MRSA infection nor to guide or monitor treatment for MRSA infections. Performed at [facility], [address] [place], [place] [address]   CULTURE, BLOOD (ROUTINE X 2) w Reflex to ID Panel     Status: None (Preliminary result)   Collection Time: 08/23/19 10:33 AM   Specimen: BLOOD  Result Value Ref Range Status   Specimen Description BLOOD LAC  Final   Special Requests   Final    BOTTLES  DRAWN AEROBIC AND ANAEROBIC Blood Culture adequate volume   Culture   Final    NO GROWTH 3 DAYS Performed at [facility], [address], [place], [place] [address]    Report Status PENDING  Incomplete  CULTURE, BLOOD (ROUTINE X 2) w Reflex to ID Panel     Status: None (Preliminary result)   Collection Time: 08/23/19 10:34 AM   Specimen: BLOOD  Result Value Ref Range Status   Specimen Description BLOOD LFA  Final   Special Requests   Final    BOTTLES DRAWN AEROBIC AND ANAEROBIC Blood Culture adequate volume   Culture   Final    NO GROWTH 3 DAYS Performed at [facility], [address] [place], [place] [address]    Report Status PENDING  Incomplete  Aerobic/Anaerobic Culture (surgical/deep wound)     Status: None (Preliminary result)   Collection Time: 08/25/19 12:52 PM   Specimen: Abscess  Result Value Ref Range Status   Specimen Description ABSCESS  Final   Special Requests post CT guided paraspinal drain  Final   Gram Stain   Final    ABUNDANT WBC PRESENT, PREDOMINANTLY PMN NO ORGANISMS SEEN Performed at [facility], [place] [address] [place], [place]  [address]    Culture PENDING  Incomplete   Report Status PENDING  Incomplete    Radiologic Imaging: CT AP WO CONTRAST, 08/19/2019: CLINICAL DATA:  69 year old female with abdominal pain. Concern for bowel obstruction.  EXAM: CT CHEST, ABDOMEN AND PELVIS WITHOUT CONTRAST  TECHNIQUE: Multidetector CT imaging of the chest, abdomen and pelvis was performed following the standard protocol without IV contrast.  COMPARISON:  Chest radiograph dated 08/19/2019.  FINDINGS: Evaluation of this exam is limited in the absence of intravenous contrast.  CT CHEST FINDINGS  Cardiovascular: There is no cardiomegaly or pericardial effusion. Coronary vascular calcification of the LAD. Mild atherosclerotic calcification of the thoracic aorta. The central pulmonary arteries are grossly unremarkable on  this noncontrast CT.  Mediastinum/Nodes: There is no hilar or mediastinal adenopathy. The esophagus and the thyroid gland are grossly unremarkable as visualized. No mediastinal fluid collection.  Lungs/Pleura: Mild pleural thickening versus minimal right pleural effusion. Right lung base linear atelectasis/scarring. No focal consolidation, or pneumothorax. The central airways are patent.  Musculoskeletal: Osteopenia with scoliosis and degenerative changes of the spine. Old healed left posterior rib fractures. No acute osseous pathology.  CT ABDOMEN PELVIS FINDINGS  No intra-abdominal free air or free fluid.  Hepatobiliary: Several small hepatic hypodense lesions are not characterized on this noncontrast CT. Cholecystectomy.  Pancreas: The pancreas is grossly unremarkable.  Spleen: Normal in size without focal abnormality.  Adrenals/Urinary Tract: The adrenal glands are unremarkable. Mild bilateral hydronephrosis, left greater right. Several nonobstructing left renal calculi measure up to 4 mm. There is left perinephric stranding concerning for pyelonephritis. Correlation with urinalysis recommended. The urinary bladder is distended. There is haziness of the bladder wall with perivesical stranding.  Stomach/Bowel: There is no bowel obstruction or active inflammation. The appendix is normal.  Vascular/Lymphatic: Moderate aortoiliac atherosclerotic disease. The IVC is grossly unremarkable. No portal venous gas. There is no adenopathy.  Reproductive: Hysterectomy.  Other: Mild diffuse subcutaneous edema. No fluid collection.  Musculoskeletal: There is severe osteopenia, scoliosis, and degenerative changes of the spine and hips. There are compression fractures of the superior endplate of L4 and inferior endplate of L3, likely acute. Clinical correlation is recommended. There is approximately 25% loss of vertebral body height at L4. There is disc desiccation at  L2-L3 and L3-L4. Bilateral L5 pars defects with grade 1 L5-S1 anterolisthesis.  IMPRESSION: 1. Compression fractures of the superior endplate of L4 and inferior endplate of L3, likely acute. Clinical correlation is recommended. 2. Distended urinary bladder with mild bilateral hydronephrosis and findings concerning for pyelonephritis. Correlation with urinalysis recommended. 3. Several nonobstructing left renal calculi.  No obstructing stone. 4. No bowel obstruction. Normal appendix. 5. Aortic Atherosclerosis (ICD10-I70.0).   Electronically Signed   By: Anner Crete M.D.   On: 08/19/2019 20:15  Assessment & Plan:  69 year old comorbid female featuring DM, Parkinson's dementia, and frequent falls admitted with Proteus urosepsis with acute left pyelonephritis and acute cystitis, acute urinary retention with bilateral hydronephrosis and AKI, and lumbar osteomyelitis and discitis with imaging reassuring for cauda equina.  She failed inpatient voiding trial yesterday requiring Foley replacement, creatinine normalized.  Unclear if patient is a reliable historian, however she does report a possible months-long prodrome of occasional difficulty urinating.  Given her history of Parkinson's dementia and diabetes, she is at increased risk of neurogenic bladder.  Unclear if her current presentation reflects acute urinary retention in the setting of infection versus acute on chronic retention with underlying neurogenic bladder.  Recommend keeping Foley catheter in place with  plans for outpatient voiding trial in clinic in 1 to 2 weeks.  If she is unable to void, she will require ongoing bladder management in terms of intermittent catheterization versus indwelling Foley.  Additionally, with unclear reports of a history of urinary discoloration and microscopic hematuria on admission, I recommend repeat UA on an outpatient basis to prove resolution of this following culture appropriate  antibiotics.  Recommendations: -Continue antibiotics -Keep Foley catheter with plans for outpatient voiding trial in approximately 2 weeks -Outpatient UA to prove resolution of MH; hematuria work-up if it does not resolve  Thank you for involving me in this patient's care, please page with any further questions or concerns.  Debroah Loop, PA-C 08/26/2019 8:10 AM

## 2019-08-27 ENCOUNTER — Inpatient Hospital Stay
Admit: 2019-08-27 | Discharge: 2019-08-27 | Disposition: A | Discharge status: Home / Self Care | Payer: Medicare Other | Attending: Internal Medicine | Admitting: Internal Medicine

## 2019-08-27 LAB — URINALYSIS, COMPLETE (UACMP) WITH MICROSCOPIC
Bilirubin Urine: NEGATIVE
Glucose, UA: NEGATIVE mg/dL
Ketones, ur: NEGATIVE mg/dL
Nitrite: NEGATIVE
Protein, ur: 100 mg/dL — AB
RBC / HPF: >50 RBC/hpf — ABNORMAL HIGH (ref 0–5)
Specific Gravity, Urine: 1.017 (ref 1.005–1.030)
Squamous Epithelial / HPF: NONE SEEN (ref 0–5)
WBC, UA: >50 WBC/hpf — ABNORMAL HIGH (ref 0–5)
pH: 6 (ref 5.0–8.0)

## 2019-08-27 LAB — CBC WITH DIFFERENTIAL/PLATELET
Abs Immature Granulocytes: 0.18 10*3/uL — ABNORMAL HIGH (ref 0.00–0.07)
Basophils Absolute: 0 10*3/uL (ref 0.0–0.1)
Basophils Relative: 0 %
Eosinophils Absolute: 0.2 10*3/uL (ref 0.0–0.5)
Eosinophils Relative: 1 %
HCT: 30.5 % — ABNORMAL LOW (ref 36.0–46.0)
Hemoglobin: 10.2 g/dL — ABNORMAL LOW (ref 12.0–15.0)
Immature Granulocytes: 2 %
Lymphocytes Relative: 14 %
Lymphs Abs: 1.5 10*3/uL (ref 0.7–4.0)
MCH: 31.3 pg (ref 26.0–34.0)
MCHC: 33.4 g/dL (ref 30.0–36.0)
MCV: 93.6 fL (ref 80.0–100.0)
Monocytes Absolute: 0.5 10*3/uL (ref 0.1–1.0)
Monocytes Relative: 5 %
Neutro Abs: 8.7 10*3/uL — ABNORMAL HIGH (ref 1.7–7.7)
Neutrophils Relative %: 78 %
Platelets: 359 10*3/uL (ref 150–400)
RBC: 3.26 MIL/uL — ABNORMAL LOW (ref 3.87–5.11)
RDW: 13.4 % (ref 11.5–15.5)
WBC: 11.1 10*3/uL — ABNORMAL HIGH (ref 4.0–10.5)
nRBC: 0 % (ref 0.0–0.2)

## 2019-08-27 LAB — BASIC METABOLIC PANEL
Anion gap: 9 (ref 5–15)
BUN: 24 mg/dL — ABNORMAL HIGH (ref 8–23)
CO2: 23 mmol/L (ref 22–32)
Calcium: 7.9 mg/dL — ABNORMAL LOW (ref 8.9–10.3)
Chloride: 107 mmol/L (ref 98–111)
Creatinine, Ser: 0.57 mg/dL (ref 0.44–1.00)
GFR calc Af Amer: >60 mL/min (ref >60)
GFR calc non Af Amer: >60 mL/min (ref >60)
Glucose, Bld: 113 mg/dL — ABNORMAL HIGH (ref 70–99)
Potassium: 3.9 mmol/L (ref 3.5–5.1)
Sodium: 139 mmol/L (ref 135–145)

## 2019-08-27 LAB — GLUCOSE, CAPILLARY
Glucose-Capillary: 145 mg/dL — ABNORMAL HIGH (ref 70–99)
Glucose-Capillary: 185 mg/dL — ABNORMAL HIGH (ref 70–99)
Glucose-Capillary: 153 mg/dL — ABNORMAL HIGH (ref 70–99)
Glucose-Capillary: 103 mg/dL — ABNORMAL HIGH (ref 70–99)

## 2019-08-27 LAB — HEMOGLOBIN A1C
Hgb A1c MFr Bld: 5.6 % (ref 4.8–5.6)
Mean Plasma Glucose: 114.02 mg/dL

## 2019-08-27 LAB — T4, FREE: Free T4: 0.76 ng/dL (ref 0.61–1.12)

## 2019-08-27 LAB — HEPATIC FUNCTION PANEL
ALT: 26 U/L (ref 0–44)
AST: 68 U/L — ABNORMAL HIGH (ref 15–41)
Albumin: 2 g/dL — ABNORMAL LOW (ref 3.5–5.0)
Alkaline Phosphatase: 72 U/L (ref 38–126)
Bilirubin, Direct: <0.1 mg/dL (ref 0.0–0.2)
Total Bilirubin: 0.6 mg/dL (ref 0.3–1.2)
Total Protein: 6.6 g/dL (ref 6.5–8.1)

## 2019-08-27 LAB — TSH: TSH: 14.706 u[IU]/mL — ABNORMAL HIGH (ref 0.350–4.500)

## 2019-08-27 MED ORDER — SODIUM CHLORIDE 0.9% FLUSH: 10.0000 mL | INTRAVENOUS | Status: DC | PRN

## 2019-08-27 MED ORDER — SODIUM CHLORIDE 0.9% FLUSH
10.0000 mL | Freq: Two times a day (BID) | INTRAVENOUS | Status: DC
  Administered 2019-08-27 – 2019-09-03 (×9): 10 mL

## 2019-08-27 MED ORDER — SODIUM CHLORIDE 0.9 % IV SOLN: INTRAVENOUS | Status: DC | PRN

## 2019-08-27 NOTE — Progress Notes (Signed)
Repeat imaging recommended  to see whether the psoas drain can be removed if the abscess is completely drained

## 2019-08-27 NOTE — Progress Notes (Signed)
Large amount of time spent at bedside with patient today. Frequent contact for requests such as water, bed pan, medicine. Many of these had already been given or patient was already on bed pan and forgot. Many requests occurred because patient simply forgot the tasks had already been completed.  "care giver" Vaughan Basta at bedside twice today for short 20 minute stays. Very boisterous each visit and accusing nursing of not caring for Patient..patient told Vaughan Basta she had not eaten today when Faith visited after lunch. This RN personally fed patient breakfast and cut up lunch food items and fed patient until she stated she did not want anymore food. Vaughan Basta noted lunch tray at bedside still had food on it, came to desk bosterious stating patient is being starved. Education was given to [place] from this RN about the events of the day only to be met with refusal of the truth. Vaughan Basta is the care giver at home. This RN asked if Vaughan Basta wanted to stay for dinner to ensure patient ate, food tray arrived, [place] left without feeding patient stating she had to go eat dinner herself. Earlier this morning in her morning she told me she handled Patient's finances. With her lack of regard to offering her own "hands on" care giving, I am concerned about Vaughan Basta controlling patient's money. I will send message to social worker about the situation. This patient is intermittently pleasantly confused. Discharge to [place] should occur Thursday as the PICC line for antibiotic treatment was placed successfully today by Lab Vaughan Basta). Vaughan Basta stated patient slept through the procedure.

## 2019-08-27 NOTE — Progress Notes (Signed)
PROGRESS NOTE    Theresa Mills   YYQ:[id number]  DOB: [birth year]  PCP: Ronnell Freshwater, NP    DOA: 08/19/2019 LOS: 8   Brief Narrative   Theresa Mills is a 69 y.o. female with medical history of DM, GERD, anxiety, OSA, Parkinson's with dementia, hypothyroidism, frequent falls, brought in 08/19/19 by EMS after SNF staff called due to shortness of breath and altered mental status.  Patient complained of abdominal pain and neck pain but could not provide additional history due to dementia and altered mental status.  In the ED, patient was lethargic, afebrile, hypotensive with BP 83/41, HR 90, RR 24, O2 sat 93% on room air.  Labs were notable for leukocytosis 18k, lactic acidosis 2.5.  UA showed pyuria.  Creatinine was 2.43 (baseline 0.69), procalcitonin 4.62, troponin 28.  CT abdomen pelvis showed distended urinary bladder with bilateral hydronephrosis and findings concerning for pyelonephritis, several nonobstructing left renal calculi.  No focal consolidation was seen on CT chest.  EKG was normal sinus rhythm.  Patient's blood pressure improved with IV fluid bolus.  Started on IV antibiotics and admitted to hospitalist service for further evaluation and management.   Pt today is alert and oriented and denies any complaints. Vitals are stable pt is afebrile.   Assessment & Plan   Principal Problem:   Osteomyelitis of lumbar vertebra (HCC) Active Problems:   Dementia due to Parkinson's disease without behavioral disturbance (HCC)   Diabetes mellitus type 2, uncomplicated (HCC)   Essential hypertension   Pressure injury of coccygeal region, stage 2 (HCC)   Acquired hypothyroidism   Severe sepsis (HCC)   Bilateral hydronephrosis   Acute pyelonephritis   Acute metabolic encephalopathy   Acute urinary retention   AKI (acute kidney injury) ([place])   Malnutrition of moderate degree   Lumbar compression fracture (HCC)   Lumbar discitis   Lumbar Osteomyelitis / Discitis / Epidural  Abscess / Right psoas abscess - obtained MRI lumbar spine due to ongoing urinary retention, no sign of cauda equina syndrome, but appears to have extensive infection involving levels L3-L4.  ID consulted.   IR did CT-guided aspiration of the psoas abscess, cultures growing Proteus. Will need 6 weeks IV Rocephin 2 g every 24 hours.  Monitor ESR/CRP. --Flush percutaneous drain with 10 cc sterile saline daily. --Continue Rocephin --Place order for PICC line --Pt s/p PICC line placed today in RA for extended duration of iv abx.   Severe sepsis secondary to acute pyelonephritis and bacteremia - present on admission with leukocytosis, tachycardia, hypotension that was fluid responsive, lactic acidosis and AKI.  Imaging findings and UA are consistent with pyelonephritis.   Sepsis physiology has improved and lactic acidosis resolved.  Blood cultures grew Proteus and Enterobacteriaceae spp.  Abscess cultures as above, now on long course Rocephin.   Continue IV antibiotic regimen for ongoing bacteremia and positive blood cultures.  Bilateral hydronephrosis secondary to acute urinary retention -present on admission, CT abdomen showed distended bladder and bilateral hydronephrosis.  Foley catheter removed this AM for voiding trial.  Foley replaced 8/1 (almost 800 cc on bladder scan).  Flomax started 7/29.  MRI lumbar ruled out cauda equina syndrome.   Urology consulted.  Recommend keeping the Foley catheter in place and voiding trial as outpatient in about 2 weeks.  Start patient on IV fluids patient still has persistent pyuria from hydronephrosis.  Acute kidney injury - Resolved with Foley and IV fluids.  Present on admission with creatinine 2.43, prior baseline 0.69.  Likely multifactorial with sepsis and hypotension causing prerenal azotemia and post renal secondary to hydronephrosis and acute urinary retention.   Improved with Foley and IVF's.  Foley d/c'd for voiding trial, but replaced on 8/1 due to  persistent retention.  Daily BMP to monitor.  Avoid nephrotoxins and hypotension.    Lumbar compression fractures- present on admission, seen on CT abdomen/pelvis in the ED, showed compression fractures of the superior endplate of L4 and inferior endplate of L3, likely acute.  Patient has not complained of back pain throughout admission.  MRI obtained, showed infection as above.  No neurosurgery consult for now given infection at that level.    Acute metabolic encephalopathy - Present on admission likely secondary to infection on top of baseline dementia.  Resolved with treatment of infection.    Transaminitis -present on admission, improving.  AST 271>>103, ALT 24>>64, alk phos 137>>94, normal total bili 1.2>>1.0.  RUQ showed liver echogenicity indicative of fatty infiltration, no focal liver lesions, gallbladder absent..  Suspect due to severe sepsis and shock liver.  AST ALT pending today.  Type 2 diabetes -sliding scale NovoLog, A1c.  Dementia due to Parkinson's disease without behavioral disturbance -no acute issues.  Continue Sinemet, ropinirole  Essential hypertension -not on antihypertensives outpatient  Acquired hypothyroidism -continue levothyroxine  Hyperlipidemia -continue simvastatin  Depression/anxiety -continue Zoloft, mirtazapine, Celexa, and Xanax  Insomnia -continue melatonin  Pressure Injury -   Pressure Injury 08/20/19 Coccyx Medial Stage 2 -  Partial thickness loss of dermis presenting as a shallow open injury with a red, pink wound bed without slough. (Active)  08/20/19 0500  Location: Coccyx  Location Orientation: Medial  Staging: Stage 2 -  Partial thickness loss of dermis presenting as a shallow open injury with a red, pink wound bed without slough.  Wound Description (Comments):   Present on Admission: Yes   Patient BMI: Body mass index is 27.48 kg/m.   DVT prophylaxis:  Lovenox.  Diet:  Diet Orders (From admission, onward)    Start     Ordered    08/26/19 1333  Diet regular Room service appropriate? Yes; Fluid consistency: Thin  Diet effective now       Question Answer Comment  Room service appropriate? Yes   Fluid consistency: Thin      08/26/19 1332            Code Status: Full Code    Subjective 08/27/19    Patient seen at bedside this morning.  No acute events reported overnight.  Says she is feeling better today.  Just continues to be very tired and weak feeling.  Denies fevers or chills, chest pain or shortness of breath, nausea vomiting diarrhea or other acute complaints.    Disposition Plan & Communication   Status is: Inpatient  Remains inpatient appropriate because:IV treatments appropriate due to intensity of illness or inability to take PO.  Continues to have acute urinary retention after voiding trial.  Urology consult pending.  Spinal infection with cultures pending.  Dispo: The patient is from: SNF [facility])              Anticipated d/c is to: SN[facility])              Anticipated d/c date is: 1-2 days              Patient currently is not medically stable to d/c.   Family Communication:  A/P: Spoke with friend, Vaughan Basta, by phone this afternoon at patient's request.  She and patient's sister do not want her to return to [facility], feel she has not been well cared for there.  Has been to [facility] in the past, and had positive experience there.  Vaughan Basta says patient was ambulatory prior to her admission that led to d/c to rehab at [facility].  Consults, Procedures, Significant Events   Consultants:  Infectious disease. Vascular access team. Case management team.  Procedures:   Foley placed in the ER 7/27, discontinued 7/30 for void trial  Foley replaced 8/1  8/2 CT guided drain catheter placed in right psoas abscess, aspirated for culture  Antimicrobials:   Rocephin 7/27 >> 7/30  Ampicillin 7/30 >>8/3  Rocephin 8/3 >>    Objective   Vitals:   08/26/19 1940 08/27/19  0448 08/27/19 0800 08/27/19 1100  BP: 127/67 (!) 105/94 (!) 113/59   Pulse: 71 69    Resp:   20   Temp: 98 F (36.7 C) 97.8 F (36.6 C) 98.4 F (36.9 C) 98.5 F (36.9 C)  TempSrc: Oral Oral Oral Oral  SpO2: 95% 97%    Weight:  72.6 kg    Height:        Intake/Output Summary (Last 24 hours) at 08/27/2019 1454 Last data filed at 08/27/2019 0525 Gross per 24 hour  Intake 743.73 ml  Output 1155 ml  Net -411.27 ml   Filed Weights   08/25/19 0437 08/26/19 0428 08/27/19 0448  Weight: 74.4 kg 75.1 kg 72.6 kg    Physical Exam: Blood pressure (!) 113/59, pulse 69, temperature 98.5 F (36.9 C), temperature source Oral, resp. rate 20, height 5' 4"  (1.626 m), weight 72.6 kg, SpO2 97 %. General exam: Sleeping, wakes to voice, no acute distress, obese HEENT: Poor dentition, hearing grossly normal Respiratory system: CTAB, respiratory effort. Cardiovascular system: normal S1/S2, RRR, no pitting edema.  Neurologic: No gross focal deficits, normal speech, A&O x4 Extremities: moves all, no cyanosis, normal tone Psychiatry: normal mood, flat affect. GU: Foley in place.  Labs   Data Reviewed: I have personally reviewed following labs and imaging studies  CBC: Recent Labs  Lab 08/23/19 0545 08/24/19 1035 08/25/19 0910 08/26/19 0538 08/27/19 0501  WBC 19.1* 13.4* 11.3* 11.2* 11.1*  NEUTROABS 16.6* 10.8* 9.0* 8.9* 8.7*  HGB 11.1* 10.5* 10.0* 10.5* 10.2*  HCT 33.8* 30.9* 30.3* 30.8* 30.5*  MCV 94.7 92.2 93.8 93.1 93.6  PLT 264 269 277 319 299   Basic Metabolic Panel: Recent Labs  Lab 08/21/19 0548 08/22/19 0608 08/23/19 0545 08/24/19 1035 08/25/19 0910 08/26/19 0538 08/27/19 0501  NA 140   < > 139 136 136 138 139  K 4.3   < > 4.3 4.1 3.9 3.9 3.9  CL 114*   < > 108 107 107 106 107  CO2 18*   < > 21* 20* 23 24 23   GLUCOSE 117*   < > 110* 135* 110* 145* 113*  BUN 48*   < > 40* 31* 25* 23 24*  CREATININE 1.09*   < > 1.21* 0.76 0.65 0.70 0.57  CALCIUM 7.7*   < > 8.1* 7.8* 7.6*  8.1* 7.9*  MG 1.8  --   --  1.6* 1.7  --   --    < > = values in this interval not displayed.   GFR: Estimated Creatinine Clearance: 63.9 mL/min (by C-G formula based on SCr of 0.57 mg/dL). Liver Function Tests: Recent Labs  Lab 08/21/19 0548 08/24/19 1035  AST 103* 101*  ALT 64* 20  ALKPHOS 94 93  BILITOT 1.0 0.8  PROT 6.2* 6.5  ALBUMIN 1.8* 1.8*   CBG: Recent Labs  Lab 08/26/19 1120 08/26/19 1704 08/26/19 2111 08/27/19 0804 08/27/19 1213  GLUCAP 106* 106* 139* 145* 185*     Recent Results (from the past 240 hour(s))  Blood Culture (routine x 2)     Status: Abnormal   Collection Time: 08/19/19  6:59 PM   Specimen: BLOOD  Result Value Ref Range Status   Specimen Description   Final    BLOOD RIGHT ANTECUBITAL Performed at [facility], [address], [place], [place] [address]    Special Requests   Final    BOTTLES DRAWN AEROBIC AND ANAEROBIC Blood Culture adequate volume Performed at [facility], [address], [place], [place] [address]    Culture  Setup Time   Final    GRAM NEGATIVE RODS IN BOTH AEROBIC AND ANAEROBIC BOTTLES CRITICAL VALUE NOTED.  VALUE IS CONSISTENT WITH PREVIOUSLY REPORTED AND CALLED VALUE. Performed at [facility], [address] [place], [place] [address]    Culture (A)  Final    PROTEUS MIRABILIS SUSCEPTIBILITIES PERFORMED ON PREVIOUS CULTURE WITHIN THE LAST 5 DAYS. Performed at [facility], [place] [address], [place], [place] [address]    Report Status 08/22/2019 FINAL  Final  Blood Culture (routine x 2)     Status: Abnormal   Collection Time: 08/19/19  7:03 PM   Specimen: BLOOD  Result Value Ref Range Status   Specimen Description   Final    BLOOD LEFT ANTECUBITAL Performed at [facility], [address], [place], [place] [address]    Special Requests   Final    BOTTLES DRAWN AEROBIC AND ANAEROBIC Blood Culture adequate volume Performed at [facility], [address] [place], [place] [address]    Culture  Setup Time   Final    GRAM NEGATIVE RODS IN BOTH AEROBIC AND ANAEROBIC BOTTLES CRITICAL RESULT CALLED TO, READ BACK BY AND VERIFIED WITH: MYRA SLAUGHTER AT 0824 ON 08/20/2019 [place]. Performed at [facility], [place] [address], [place],  [address]    Culture PROTEUS MIRABILIS (A)  Final   Report Status 08/22/2019 FINAL  Final   Organism ID, Bacteria PROTEUS MIRABILIS  Final      Susceptibility   Proteus mirabilis - MIC*    AMPICILLIN <=2 SENSITIVE Sensitive     CEFAZOLIN <=4 SENSITIVE Sensitive     CEFEPIME <=0.12 SENSITIVE Sensitive     CEFTAZIDIME <=1 SENSITIVE Sensitive     CEFTRIAXONE <=0.25 SENSITIVE Sensitive     CIPROFLOXACIN 0.5 SENSITIVE Sensitive     GENTAMICIN <=1 SENSITIVE Sensitive     IMIPENEM 2 SENSITIVE Sensitive     TRIMETH/SULFA <=20 SENSITIVE Sensitive     AMPICILLIN/SULBACTAM <=2 SENSITIVE Sensitive     PIP/TAZO <=4 SENSITIVE Sensitive     * PROTEUS MIRABILIS  Blood Culture ID Panel (Reflexed)     Status: Abnormal   Collection Time: 08/19/19  7:03 PM  Result Value Ref Range Status   Enterococcus species NOT DETECTED NOT DETECTED Final   Listeria monocytogenes NOT DETECTED NOT DETECTED Final   Staphylococcus species NOT DETECTED NOT DETECTED Final   Staphylococcus aureus (BCID) NOT DETECTED NOT DETECTED Final   Streptococcus species NOT DETECTED NOT DETECTED Final   Streptococcus agalactiae NOT DETECTED NOT DETECTED Final   Streptococcus pneumoniae NOT DETECTED NOT DETECTED Final   Streptococcus pyogenes NOT DETECTED NOT DETECTED Final  Acinetobacter baumannii NOT DETECTED NOT DETECTED Final   Enterobacteriaceae species DETECTED (A) NOT DETECTED Final    Comment: Enterobacteriaceae represent a large family of gram-negative bacteria, not a single organism. CRITICAL RESULT CALLED TO, READ BACK BY AND VERIFIED WITH: MYRA SLAUGHTER AT 8453 ON 08/20/2019 [place].    Enterobacter cloacae complex NOT DETECTED NOT  DETECTED Final   Escherichia coli NOT DETECTED NOT DETECTED Final   Klebsiella oxytoca NOT DETECTED NOT DETECTED Final   Klebsiella pneumoniae NOT DETECTED NOT DETECTED Final   Proteus species DETECTED (A) NOT DETECTED Final    Comment: CRITICAL RESULT CALLED TO, READ BACK BY AND VERIFIED WITH: MYRA SLAUGHTER AT 0824 ON 08/20/2019 [place].    Serratia marcescens NOT DETECTED NOT DETECTED Final   Carbapenem resistance NOT DETECTED NOT DETECTED Final   Haemophilus influenzae NOT DETECTED NOT DETECTED Final   Neisseria meningitidis NOT DETECTED NOT DETECTED Final   Pseudomonas aeruginosa NOT DETECTED NOT DETECTED Final   Candida albicans NOT DETECTED NOT DETECTED Final   Candida glabrata NOT DETECTED NOT DETECTED Final   Candida krusei NOT DETECTED NOT DETECTED Final   Candida parapsilosis NOT DETECTED NOT DETECTED Final   Candida tropicalis NOT DETECTED NOT DETECTED Final    Comment: Performed at [facility], [address] [place], [place] [address]  Urine culture     Status: Abnormal   Collection Time: 08/19/19  9:15 PM   Specimen: In/Out Cath Urine  Result Value Ref Range Status   Specimen Description   Final    IN/OUT CATH URINE Performed at [facility], [address] [place], [place] [address]    Special Requests   Final    NONE Performed at [facility], [place], [place] [address]    Culture >=100,000 COLONIES/mL PROTEUS MIRABILIS (A)  Final   Report Status 08/22/2019 FINAL  Final   Organism ID, Bacteria PROTEUS MIRABILIS (A)  Final      Susceptibility   Proteus mirabilis - MIC*    AMPICILLIN <=2 SENSITIVE Sensitive     CEFAZOLIN <=4 SENSITIVE Sensitive     CEFTRIAXONE <=0.25 SENSITIVE Sensitive     CIPROFLOXACIN <=0.25 SENSITIVE Sensitive     GENTAMICIN <=1 SENSITIVE Sensitive     IMIPENEM 2 SENSITIVE Sensitive     NITROFURANTOIN 128 RESISTANT Resistant     TRIMETH/SULFA <=20 SENSITIVE Sensitive     AMPICILLIN/SULBACTAM  <=2 SENSITIVE Sensitive     PIP/TAZO <=4 SENSITIVE Sensitive     * >=100,000 COLONIES/mL PROTEUS MIRABILIS  SARS Coronavirus 2 by RT PCR (hospital order, performed in [place] hospital lab) Nasopharyngeal Nasopharyngeal Swab     Status: None   Collection Time: 08/19/19  9:15 PM   Specimen: Nasopharyngeal Swab  Result Value Ref Range Status   SARS Coronavirus 2 NEGATIVE NEGATIVE Final    Comment: (NOTE) SARS-CoV-2 target nucleic acids are NOT DETECTED.  The SARS-CoV-2 RNA is generally detectable in upper and lower respiratory specimens during the acute phase of infection. The lowest concentration of SARS-CoV-2 viral copies this assay can detect is 250 copies / mL. A negative result does not preclude SARS-CoV-2 infection and should not be used as the sole basis for treatment or other patient management decisions.  A negative result may occur with improper specimen collection / handling, submission of specimen other than nasopharyngeal swab, presence of viral mutation(s) within the areas targeted by this assay, and inadequate number of viral copies (<250 copies / mL). A negative result must be  combined with clinical observations, patient history, and epidemiological information.  Fact Sheet for Patients:   StrictlyIdeas.no  Fact Sheet for Healthcare Providers: BankingDealers.co.za  This test is not yet approved or  cleared by the [place] FDA and has been authorized for detection and/or diagnosis of SARS-CoV-2 by FDA under an Emergency Use Authorization (EUA).  This EUA will remain in effect (meaning this test can be used) for the duration of the COVID-19 declaration under Section 564(b)(1) of the Act, 21 U.S.C. section 360bbb-3(b)(1), unless the authorization is terminated or revoked sooner.  Performed at [facility], [address] [place], [place] [address]   MRSA PCR Screening     Status: None   Collection  Time: 08/20/19  5:55 AM   Specimen: Nasopharyngeal  Result Value Ref Range Status   MRSA by PCR NEGATIVE NEGATIVE Final    Comment:        The GeneXpert MRSA Assay (FDA approved for NASAL specimens only), is one component of a comprehensive MRSA colonization surveillance program. It is not intended to diagnose MRSA infection nor to guide or monitor treatment for MRSA infections. Performed at [facility], [address] [place], [place] [address]   CULTURE, BLOOD (ROUTINE X 2) w Reflex to ID Panel     Status: None (Preliminary result)   Collection Time: 08/23/19 10:33 AM   Specimen: BLOOD  Result Value Ref Range Status   Specimen Description BLOOD LAC  Final   Special Requests   Final    BOTTLES DRAWN AEROBIC AND ANAEROBIC Blood Culture adequate volume   Culture   Final    NO GROWTH 4 DAYS Performed at [facility], [address], [place], [place] [address]    Report Status PENDING  Incomplete  CULTURE, BLOOD (ROUTINE X 2) w Reflex to ID Panel     Status: None (Preliminary result)   Collection Time: 08/23/19 10:34 AM   Specimen: BLOOD  Result Value Ref Range Status   Specimen Description BLOOD LFA  Final   Special Requests   Final    BOTTLES DRAWN AEROBIC AND ANAEROBIC Blood Culture adequate volume   Culture   Final    NO GROWTH 4 DAYS Performed at [facility], [address] [place], [place] [address]    Report Status PENDING  Incomplete  Aerobic/Anaerobic Culture (surgical/deep wound)     Status: None (Preliminary result)   Collection Time: 08/25/19 12:52 PM   Specimen: Abscess  Result Value Ref Range Status   Specimen Description ABSCESS  Final   Special Requests post CT guided paraspinal drain  Final   Gram Stain   Final    ABUNDANT WBC PRESENT, PREDOMINANTLY PMN NO ORGANISMS SEEN Performed at [facility], [place] [address], [place], [place] [address]    Culture   Final    FEW PROTEUS MIRABILIS NO ANAEROBES ISOLATED;  CULTURE IN PROGRESS FOR 5 DAYS    Report Status PENDING  Incomplete      Imaging Studies   [place] EKG SITE RITE  Result Date: 08/26/2019 If Site Rite image not attached, placement could not be confirmed due to current cardiac rhythm.    Medications   Scheduled Meds: . ALPRAZolam  0.25 mg Oral q morning - 10a  . calcium-vitamin D  1 tablet Oral Daily  . carbidopa-levodopa  1 tablet Oral BID  . Chlorhexidine Gluconate Cloth  6 each Topical Q0600  . citalopram  40 mg Oral Daily  . docusate sodium  100 mg Oral BID  . enoxaparin (LOVENOX) injection  40 mg Subcutaneous Q24H  . feeding supplement (ENSURE ENLIVE)  237 mL Oral TID BM  . insulin aspart  0-15 Units Subcutaneous TID WC  . insulin aspart  0-5 Units Subcutaneous QHS  . levothyroxine  175 mcg Oral Q0600  . megestrol  40 mg Oral Daily  . mirtazapine  7.5 mg Oral QHS  . multivitamin with minerals  1 tablet Oral Daily  . pantoprazole  40 mg Oral Daily  . rOPINIRole  0.5 mg Oral BID  . rOPINIRole  1 mg Oral QHS  . sertraline  50 mg Oral Daily  . simvastatin  40 mg Oral QHS  . sodium chloride flush  10-40 mL Intracatheter Q12H  . sodium chloride flush  5 mL Intracatheter Q8H  . tamsulosin  0.4 mg Oral QPC supper   Continuous Infusions: . sodium chloride    . cefTRIAXone (ROCEPHIN)  IV 2 g (08/26/19 2156)     LOS: 8 days   Brandy Hale [facility] [phone] 08/27/2019, 2:54 PM    [If 7PM-7AM, please contact night-coverage. How to contact the [facility] Attending or Consulting provider [place] or covering provider during after hours [place], for this patient?    1. Check the care team in [facility] and look for a) attending/consulting TR[facility] provider listed and b) the [facility] team listed 2. Log into [email or web address] and use [place]'s universal password to access. If you do not have the password, please contact the hospital operator. 3. Locate the [facility] provider you are looking for under [facility] and page to a number that  you can be directly reached. 4. If you still have difficulty reaching the provider, please page the [facility] (Director on Call) for the Hospitalists listed on amion for assistance.]

## 2019-08-27 NOTE — Progress Notes (Signed)
0505- Dressing changed to lower back as ordered. Very scant amount of dried blood noted. Pt tolerated well. Date, time, and initialed.

## 2019-08-27 NOTE — Progress Notes (Signed)
Peripherally Inserted Central Catheter Placement  The IV Nurse has discussed with the patient and/or persons authorized to consent for the patient, the purpose of this procedure and the potential benefits and risks involved with this procedure.  The benefits include less needle sticks, lab draws from the catheter, and the patient may be discharged home with the catheter. Risks include, but not limited to, infection, bleeding, blood clot (thrombus formation), and puncture of an artery; nerve damage and irregular heartbeat and possibility to perform a PICC exchange if needed/ordered by physician.  Alternatives to this procedure were also discussed.  Bard Power PICC patient education guide, fact sheet on infection prevention and patient information card has been provided to patient /or left at bedside.    PICC Placement Documentation  PICC Single Lumen 50/35/46 PICC Right Basilic 36 cm 0 cm (Active)  Indication for Insertion or Continuance of Line Home intravenous therapies (PICC only) 08/27/19 1423  Exposed Catheter (cm) 0 cm 08/27/19 1423  Site Assessment Clean;Dry;Intact 08/27/19 1423  Line Status Blood return noted;Flushed;Saline locked 08/27/19 1423  Dressing Type Transparent;Securing device 08/27/19 1423  Dressing Status Clean;Dry;Intact;Antimicrobial disc in place 08/27/19 1423  Dressing Change Due 09/03/19 08/27/19 1423       Frances Maywood 08/27/2019, 2:27 PM

## 2019-08-28 ENCOUNTER — Inpatient Hospital Stay: Payer: Medicare Other

## 2019-08-28 LAB — CULTURE, BLOOD (ROUTINE X 2)
Culture: NO GROWTH
Culture: NO GROWTH
Special Requests: ADEQUATE
Special Requests: ADEQUATE

## 2019-08-28 LAB — CBC WITH DIFFERENTIAL/PLATELET
Abs Immature Granulocytes: 0.2 10*3/uL — ABNORMAL HIGH (ref 0.00–0.07)
Basophils Absolute: 0 10*3/uL (ref 0.0–0.1)
Basophils Relative: 0 %
Eosinophils Absolute: 0.2 10*3/uL (ref 0.0–0.5)
Eosinophils Relative: 2 %
HCT: 33.1 % — ABNORMAL LOW (ref 36.0–46.0)
Hemoglobin: 10.6 g/dL — ABNORMAL LOW (ref 12.0–15.0)
Immature Granulocytes: 2 %
Lymphocytes Relative: 14 %
Lymphs Abs: 1.6 10*3/uL (ref 0.7–4.0)
MCH: 31.2 pg (ref 26.0–34.0)
MCHC: 32 g/dL (ref 30.0–36.0)
MCV: 97.4 fL (ref 80.0–100.0)
Monocytes Absolute: 0.5 10*3/uL (ref 0.1–1.0)
Monocytes Relative: 4 %
Neutro Abs: 9 10*3/uL — ABNORMAL HIGH (ref 1.7–7.7)
Neutrophils Relative %: 78 %
Platelets: 345 10*3/uL (ref 150–400)
RBC: 3.4 MIL/uL — ABNORMAL LOW (ref 3.87–5.11)
RDW: 13.7 % (ref 11.5–15.5)
WBC: 11.5 10*3/uL — ABNORMAL HIGH (ref 4.0–10.5)
nRBC: 0 % (ref 0.0–0.2)

## 2019-08-28 LAB — COMPREHENSIVE METABOLIC PANEL
ALT: 12 U/L (ref 0–44)
AST: 58 U/L — ABNORMAL HIGH (ref 15–41)
Albumin: 2.2 g/dL — ABNORMAL LOW (ref 3.5–5.0)
Alkaline Phosphatase: 70 U/L (ref 38–126)
Anion gap: 10 (ref 5–15)
BUN: 26 mg/dL — ABNORMAL HIGH (ref 8–23)
CO2: 23 mmol/L (ref 22–32)
Calcium: 8 mg/dL — ABNORMAL LOW (ref 8.9–10.3)
Chloride: 105 mmol/L (ref 98–111)
Creatinine, Ser: 0.55 mg/dL (ref 0.44–1.00)
GFR calc Af Amer: >60 mL/min (ref >60)
GFR calc non Af Amer: >60 mL/min (ref >60)
Glucose, Bld: 129 mg/dL — ABNORMAL HIGH (ref 70–99)
Potassium: 4.1 mmol/L (ref 3.5–5.1)
Sodium: 138 mmol/L (ref 135–145)
Total Bilirubin: 0.5 mg/dL (ref 0.3–1.2)
Total Protein: 6.8 g/dL (ref 6.5–8.1)

## 2019-08-28 LAB — GLUCOSE, CAPILLARY
Glucose-Capillary: 173 mg/dL — ABNORMAL HIGH (ref 70–99)
Glucose-Capillary: 153 mg/dL — ABNORMAL HIGH (ref 70–99)
Glucose-Capillary: 123 mg/dL — ABNORMAL HIGH (ref 70–99)
Glucose-Capillary: 178 mg/dL — ABNORMAL HIGH (ref 70–99)

## 2019-08-28 LAB — ECHOCARDIOGRAM COMPLETE
Area-P 1/2: 2.26 cm2
Height: 64 in
S' Lateral: 2.26 cm
Weight: 2561.6 oz

## 2019-08-28 LAB — PHOSPHORUS: Phosphorus: 3.4 mg/dL (ref 2.5–4.6)

## 2019-08-28 LAB — MAGNESIUM: Magnesium: 1.8 mg/dL (ref 1.7–2.4)

## 2019-08-28 MED ORDER — GADOBUTROL 1 MMOL/ML IV SOLN
7.0000 mL | Freq: Once | INTRAVENOUS | Status: AC | PRN
  Administered 2019-08-28: 7 mL via INTRAVENOUS

## 2019-08-28 NOTE — Progress Notes (Signed)
PHARMACY CONSULT NOTE FOR:  OUTPATIENT  PARENTERAL ANTIBIOTIC THERAPY (OPAT)  Indication: P. Mirabilis bacteremia, discitis, psoas abscess, UTI Regimen: Ceftriaxone 2gm IV q24h End date: 10/06/2019  IV antibiotic discharge orders are pended. To discharging provider:  please sign these orders via discharge navigator,  Select New Orders & click on the button choice - Manage This Unsigned Work.     Thank you for allowing pharmacy to be a part of this patient's care.  Doreene Eland, PharmD, BCPS.   Work Cell: [phone] 08/28/2019 10:32 AM

## 2019-08-28 NOTE — Progress Notes (Signed)
PROGRESS NOTE    TASHEMA TILLER   HQP:[id number]  DOB: 12-Jun-1950  PCP: Ronnell Freshwater, NP    DOA: 08/19/2019 LOS: 9   Brief Narrative   Theresa Mills is a 69 y.o. female with medical history of DM, GERD, anxiety, OSA, Parkinson's with dementia, hypothyroidism, frequent falls, brought in 08/19/19 by EMS after SNF staff called due to shortness of breath and altered mental status.  Patient complained of abdominal pain and neck pain but could not provide additional history due to dementia and altered mental status.  In the ED, patient was lethargic, afebrile, hypotensive with BP 83/41, HR 90, RR 24, O2 sat 93% on room air.  Labs were notable for leukocytosis 18k, lactic acidosis 2.5.  UA showed pyuria.  Creatinine was 2.43 (baseline 0.69), procalcitonin 4.62, troponin 28.  CT abdomen pelvis showed distended urinary bladder with bilateral hydronephrosis and findings concerning for pyelonephritis, several nonobstructing left renal calculi.  No focal consolidation was seen on CT chest.  EKG was normal sinus rhythm.  Patient's blood pressure improved with IV fluid bolus.  Started on IV antibiotics and admitted to hospitalist service for further evaluation and management.   Pt today is alert and oriented and denies any complaints. Vitals are stable pt is afebrile.   Assessment & Plan   Principal Problem:   Osteomyelitis of lumbar vertebra (HCC) Active Problems:   Dementia due to Parkinson's disease without behavioral disturbance (HCC)   Diabetes mellitus type 2, uncomplicated (HCC)   Essential hypertension   Pressure injury of coccygeal region, stage 2 (HCC)   Acquired hypothyroidism   Severe sepsis (HCC)   Bilateral hydronephrosis   Acute pyelonephritis   Acute metabolic encephalopathy   Acute urinary retention   AKI (acute kidney injury) ([place])   Malnutrition of moderate degree   Lumbar compression fracture (HCC)   Lumbar discitis   Lumbar Osteomyelitis / Discitis / Epidural  Abscess / Right psoas abscess - obtained MRI lumbar spine due to ongoing urinary retention, no sign of cauda equina syndrome, but appears to have extensive infection involving levels L3-L4.  ID consulted.   IR did CT-guided aspiration of the psoas abscess, cultures growing Proteus. Will need 6 weeks IV Rocephin 2 g every 24 hours.  Monitor ESR/CRP. --Flush percutaneous drain with 10 cc sterile saline daily. --Continue Rocephin --Place order for PICC line --Pt s/p PICC line placed today in RA for extended duration of iv abx.  --neurosurgery consulted / iv abx continued. -- will sch pt for left psoas drain by CT IR.  Severe sepsis secondary to acute pyelonephritis and bacteremia - present on admission with leukocytosis, tachycardia, hypotension that was fluid responsive, lactic acidosis and AKI.  Imaging findings and UA are consistent with pyelonephritis.   Sepsis physiology has improved and lactic acidosis resolved.  Blood cultures grew Proteus and Enterobacteriaceae spp.  Abscess cultures as above, now on long course Rocephin.   Continue IV antibiotic regimen for ongoing bacteremia and positive blood cultures.  Bilateral hydronephrosis secondary to acute urinary retention -present on admission, CT abdomen showed distended bladder and bilateral hydronephrosis.  Foley catheter removed this AM for voiding trial.  Foley replaced 8/1 (almost 800 cc on bladder scan).  Flomax started 7/29.  MRI lumbar ruled out cauda equina syndrome.   Urology consulted.  Recommend keeping the Foley catheter in place and voiding trial as outpatient in about 2 weeks.  Start patient on IV fluids patient still has persistent pyuria from hydronephrosis.  Acute kidney injury -  Resolved with Foley and IV fluids.  Present on admission with creatinine 2.43, prior baseline 0.69.  Likely multifactorial with sepsis and hypotension causing prerenal azotemia and post renal secondary to hydronephrosis and acute urinary retention.    Improved with Foley and IVF's.  Foley d/c'd for voiding trial, but replaced on 8/1 due to persistent retention.  Daily BMP to monitor.  Avoid nephrotoxins and hypotension.    Lumbar compression fractures- present on admission, seen on CT abdomen/pelvis in the ED, showed compression fractures of the superior endplate of L4 and inferior endplate of L3, likely acute.  Patient has not complained of back pain throughout admission.  MRI obtained, showed infection as above.  No neurosurgery consult for now given infection at that level.    Acute metabolic encephalopathy - Present on admission likely secondary to infection on top of baseline dementia.  Resolved with treatment of infection.    Transaminitis -present on admission, improving.  AST 271>>103, ALT 24>>64, alk phos 137>>94, normal total bili 1.2>>1.0.  RUQ showed liver echogenicity indicative of fatty infiltration, no focal liver lesions, gallbladder absent..  Suspect due to severe sepsis and shock liver.  AST ALT pending today.  Type 2 diabetes -sliding scale NovoLog, A1c 5.6  Dementia due to Parkinson's disease without behavioral disturbance -no acute issues.  Continue Sinemet, ropinirole  Essential hypertension -not on antihypertensives outpatient  Acquired hypothyroidism -continue levothyroxine  Hyperlipidemia -continue simvastatin  Depression/anxiety -continue Zoloft, mirtazapine, Celexa, and Xanax  Insomnia -continue melatonin  Pressure Injury -   Pressure Injury 08/20/19 Coccyx Medial Stage 2 -  Partial thickness loss of dermis presenting as a shallow open injury with a red, pink wound bed without slough. (Active)  08/20/19 0500  Location: Coccyx  Location Orientation: Medial  Staging: Stage 2 -  Partial thickness loss of dermis presenting as a shallow open injury with a red, pink wound bed without slough.  Wound Description (Comments):   Present on Admission: Yes   Patient BMI: Body mass index is 28.01 kg/m.   DVT  prophylaxis:  Lovenox.  Diet:  Diet Orders (From admission, onward)    Start     Ordered   08/26/19 1333  Diet regular Room service appropriate? Yes; Fluid consistency: Thin  Diet effective now       Question Answer Comment  Room service appropriate? Yes   Fluid consistency: Thin      08/26/19 1332            Code Status: Full Code    Subjective 08/28/19    Patient seen at bedside this morning.  No acute events reported overnight.  Says she is feeling better today.  Just continues to be very tired and weak feeling.  Denies fevers or chills, chest pain or shortness of breath, nausea vomiting diarrhea or other acute complaints.    08/28/19: Repeat Mri shows: IMPRESSION: 1. Fracture of the inferior endplate of L3 is unchanged. Fracture of the superior endplate of L4 shows mild interval progression. 2. 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 Progression of left psoas abscess and improvement in right psoas abscess since prior drainage. There is a posterior epidural fluid collection on the right at L3-4 compatible with abscess. This is likely rising from infected right facet joint. In addition, there is probable infection in the L3-4 disc space. 3. Scoliosis and degenerative changes throughout the lumbar spine as Above. Pt is alert and awake and pain is better. Requested neurosurgery to  evaluate pt  As well due to discitis. Echo reports as below: 1. Left ventricular ejection fraction, by estimation, is 60 to 65%. The left ventricle has normal function. The left ventricle has no regional wall motion abnormalities. Left ventricular diastolic parameters were normal. 2. Right ventricular systolic function is normal. The right ventricular size is normal. There is normal pulmonary artery systolic pressure. 3. The mitral valve is grossly normal. No evidence of mitral valve regurgitation. 4. The aortic valve is grossly normal.  Aortic valve regurgitation is not visualized.  Disposition Plan & Communication   Status is: Inpatient  Remains inpatient appropriate because:IV treatments appropriate due to intensity of illness or inability to take PO.  Continues to have acute urinary retention after voiding trial.  Urology consult pending.  Spinal infection with cultures pending.  Dispo: The patient is from: SN[facility])              Anticipated d/c is to: SN[facility])              Anticipated d/c date is: 1-2 days              Patient currently is not medically stable to d/c.   Family Communication:  A/P: Spoke with friend, Vaughan Basta, by phone this afternoon at patient's request.  She and patient's sister do not want her to return to [facility], feel she has not been well cared for there.  Has been to [facility] in the past, and had positive experience there.  Vaughan Basta says patient was ambulatory prior to her admission that led to d/c to rehab at [facility].  Consults, Procedures, Significant Events   Consultants:  Infectious disease. Vascular access team. Case management team.  Procedures:   Foley placed in the ER 7/27, discontinued 7/30 for void trial  Foley replaced 8/1  8/2 CT guided drain catheter placed in right psoas abscess, aspirated for culture  Antimicrobials:   Rocephin 7/27 >> 7/30  Ampicillin 7/30 >>8/3  Rocephin 8/3 >>Active.  Objective   Vitals:   08/28/19 1200 08/28/19 1300 08/28/19 1500 08/28/19 1600  BP: 109/62 119/61    Pulse:      Resp: 14 16 (!) 22 17  Temp:      TempSrc:      SpO2:      Weight:      Height:        Intake/Output Summary (Last 24 hours) at 08/28/2019 1901 Last data filed at 08/28/2019 1839 Gross per 24 hour  Intake 1314.52 ml  Output 1270 ml  Net 44.52 ml   Filed Weights   08/26/19 0428 08/27/19 0448 08/28/19 0508  Weight: 75.1 kg 72.6 kg 74 kg    Physical Exam: Blood pressure 119/61, pulse 70, temperature 98 F (36.7 C), resp.  rate 17, height _0  (1.626 m), weight 74 kg, SpO2 95 %. General exam: Sleeping, wakes to voice, no acute distress, obese HEENT: Poor dentition, hearing grossly normal Respiratory system: CTAB, respiratory effort. Cardiovascular system: normal S1/S2, RRR, no pitting edema.  Neurologic: No gross focal deficits, normal speech, A&O x4 Extremities: moves all, no cyanosis, normal tone Psychiatry: normal mood, flat affect. GU: Foley in place.  Labs   Data Reviewed: I have personally reviewed following labs and imaging studies  CBC: Recent Labs  Lab 08/24/19 1035 08/25/19 0910 08/26/19 0538 08/27/19 0501 08/28/19 0541  WBC 13.4* 11.3* 11.2* 11.1* 11.5*  NEUTROABS 10.8* 9.0* 8.9* 8.7* 9.0*  HGB 10.5* 10.0* 10.5* 10.2* 10.6*  HCT 30.9* 30.3* 30.8* 30.5* 33.1*  MCV 92.2 93.8 93.1 93.6 97.4  PLT 269 277 319 359 665   Basic Metabolic Panel: Recent Labs  Lab 08/24/19 1035 08/25/19 0910 08/26/19 0538 08/27/19 0501 08/28/19 0541  NA 136 136 138 139 138  K 4.1 3.9 3.9 3.9 4.1  CL 107 107 106 107 105  CO2 20* _0 GLUCOSE 135* 110* 145* 113* 129*  BUN 31* 25* 23 24* 26*  CREATININE 0.76 0.65 0.70 0.57 0.55  CALCIUM 7.8* 7.6* 8.1* 7.9* 8.0*  MG 1.6* 1.7  --   --  1.8  PHOS  --   --   --   --  3.4   GFR: Estimated Creatinine Clearance: 64.5 mL/min (by C-G formula based on SCr of 0.55 mg/dL). Liver Function Tests: Recent Labs  Lab 08/24/19 1035 08/27/19 0501 08/28/19 0541  AST 101* 68* 58*  ALT _1 ALKPHOS 93 72 70  BILITOT 0.8 0.6 0.5  PROT 6.5 6.6 6.8  ALBUMIN 1.8* 2.0* 2.2*   CBG: Recent Labs  Lab 08/27/19 1650 08/27/19 2051 08/28/19 0824 08/28/19 1237 08/28/19 1648  GLUCAP 153* 103* 173* 153* 123*     Recent Results (from the past 240 hour(s))  Blood Culture (routine x 2)     Status: Abnormal   Collection Time: 08/19/19  6:59 PM   Specimen: BLOOD  Result Value Ref Range Status   Specimen Description   Final    BLOOD RIGHT  ANTECUBITAL Performed at [facility], [address] [place], [place] [address]    Special Requests   Final    BOTTLES DRAWN AEROBIC AND ANAEROBIC Blood Culture adequate volume Performed at [facility], [address] [place], [place] [address]    Culture  Setup Time   Final    GRAM NEGATIVE RODS IN BOTH AEROBIC AND ANAEROBIC BOTTLES CRITICAL VALUE NOTED.  VALUE IS CONSISTENT WITH PREVIOUSLY REPORTED AND CALLED VALUE. Performed at [facility], [address] [place], [place] [address]    Culture (A)  Final    PROTEUS MIRABILIS SUSCEPTIBILITIES PERFORMED ON PREVIOUS CULTURE WITHIN THE LAST 5 DAYS. Performed at [facility], [place] [address], [place], [place] [address]    Report Status 08/22/2019 FINAL  Final  Blood Culture (routine x 2)     Status: Abnormal   Collection Time: 08/19/19  7:03 PM   Specimen: BLOOD  Result Value Ref Range Status   Specimen Description   Final    BLOOD LEFT ANTECUBITAL Performed at [facility], [address], [place], [place] [address]    Special Requests   Final    BOTTLES DRAWN AEROBIC AND ANAEROBIC Blood Culture adequate volume Performed at [facility], [address] [place], [place] [address]    Culture  Setup Time   Final    GRAM NEGATIVE RODS IN BOTH AEROBIC AND ANAEROBIC BOTTLES CRITICAL RESULT CALLED TO, READ BACK BY AND VERIFIED WITH: MYRA SLAUGHTER AT 0824 ON 08/20/2019 [place]. Performed at [facility], [place] [address] [place], [place] [address]    Culture PROTEUS MIRABILIS (A)  Final   Report Status 08/22/2019 FINAL  Final   Organism ID, Bacteria PROTEUS MIRABILIS  Final      Susceptibility   Proteus mirabilis - MIC*    AMPICILLIN <=2 SENSITIVE Sensitive     CEFAZOLIN <=4 SENSITIVE Sensitive     CEFEPIME <=0.12 SENSITIVE Sensitive     CEFTAZIDIME <=  1 SENSITIVE Sensitive     CEFTRIAXONE <=0.25 SENSITIVE Sensitive     CIPROFLOXACIN 0.5 SENSITIVE Sensitive      GENTAMICIN <=1 SENSITIVE Sensitive     IMIPENEM 2 SENSITIVE Sensitive     TRIMETH/SULFA <=20 SENSITIVE Sensitive     AMPICILLIN/SULBACTAM <=2 SENSITIVE Sensitive     PIP/TAZO <=4 SENSITIVE Sensitive     * PROTEUS MIRABILIS  Blood Culture ID Panel (Reflexed)     Status: Abnormal   Collection Time: 08/19/19  7:03 PM  Result Value Ref Range Status   Enterococcus species NOT DETECTED NOT DETECTED Final   Listeria monocytogenes NOT DETECTED NOT DETECTED Final   Staphylococcus species NOT DETECTED NOT DETECTED Final   Staphylococcus aureus (BCID) NOT DETECTED NOT DETECTED Final   Streptococcus species NOT DETECTED NOT DETECTED Final   Streptococcus agalactiae NOT DETECTED NOT DETECTED Final   Streptococcus pneumoniae NOT DETECTED NOT DETECTED Final   Streptococcus pyogenes NOT DETECTED NOT DETECTED Final   Acinetobacter baumannii NOT DETECTED NOT DETECTED Final   Enterobacteriaceae species DETECTED (A) NOT DETECTED Final    Comment: Enterobacteriaceae represent a large family of gram-negative bacteria, not a single organism. CRITICAL RESULT CALLED TO, READ BACK BY AND VERIFIED WITH: MYRA SLAUGHTER AT 3235 ON 08/20/2019 [place].    Enterobacter cloacae complex NOT DETECTED NOT DETECTED Final   Escherichia coli NOT DETECTED NOT DETECTED Final   Klebsiella oxytoca NOT DETECTED NOT DETECTED Final   Klebsiella pneumoniae NOT DETECTED NOT DETECTED Final   Proteus species DETECTED (A) NOT DETECTED Final    Comment: CRITICAL RESULT CALLED TO, READ BACK BY AND VERIFIED WITH: MYRA SLAUGHTER AT 0824 ON 08/20/2019 [place].    Serratia marcescens NOT DETECTED NOT DETECTED Final   Carbapenem resistance NOT DETECTED NOT DETECTED Final   Haemophilus influenzae NOT DETECTED NOT DETECTED Final   Neisseria meningitidis NOT DETECTED NOT DETECTED Final   Pseudomonas aeruginosa NOT DETECTED NOT DETECTED Final   Candida albicans NOT DETECTED NOT DETECTED Final   Candida glabrata NOT DETECTED NOT DETECTED Final    Candida krusei NOT DETECTED NOT DETECTED Final   Candida parapsilosis NOT DETECTED NOT DETECTED Final   Candida tropicalis NOT DETECTED NOT DETECTED Final    Comment: Performed at [facility], [address] [place], [place] [address]  Urine culture     Status: Abnormal   Collection Time: 08/19/19  9:15 PM   Specimen: In/Out Cath Urine  Result Value Ref Range Status   Specimen Description   Final    IN/OUT CATH URINE Performed at [facility], [address], [place], [place] [address]    Special Requests   Final    NONE Performed at [facility], [place], [place] [address]    Culture >=100,000 COLONIES/mL PROTEUS MIRABILIS (A)  Final   Report Status 08/22/2019 FINAL  Final   Organism ID, Bacteria PROTEUS MIRABILIS (A)  Final      Susceptibility   Proteus mirabilis - MIC*    AMPICILLIN <=2 SENSITIVE Sensitive     CEFAZOLIN <=4 SENSITIVE Sensitive     CEFTRIAXONE <=0.25 SENSITIVE Sensitive     CIPROFLOXACIN <=0.25 SENSITIVE Sensitive     GENTAMICIN <=1 SENSITIVE Sensitive     IMIPENEM 2 SENSITIVE Sensitive     NITROFURANTOIN 128 RESISTANT Resistant     TRIMETH/SULFA <=20 SENSITIVE Sensitive     AMPICILLIN/SULBACTAM <=2 SENSITIVE Sensitive     PIP/TAZO <=4 SENSITIVE Sensitive     * >=100,000 COLONIES/mL PROTEUS MIRABILIS  SARS Coronavirus 2 by RT PCR (hospital order, performed in [facility] hospital lab) Nasopharyngeal Nasopharyngeal Swab     Status: None   Collection Time: 08/19/19  9:15 PM   Specimen: Nasopharyngeal Swab  Result Value Ref Range Status   SARS Coronavirus 2 NEGATIVE NEGATIVE Final    Comment: (NOTE) SARS-CoV-2 target nucleic acids are NOT DETECTED.  The SARS-CoV-2 RNA is generally detectable in upper and lower respiratory specimens during the acute phase of infection. The lowest concentration of SARS-CoV-2 viral copies this assay can detect is 250 copies / mL. A negative result does not preclude SARS-CoV-2  infection and should not be used as the sole basis for treatment or other patient management decisions.  A negative result may occur with improper specimen collection / handling, submission of specimen other than nasopharyngeal swab, presence of viral mutation(s) within the areas targeted by this assay, and inadequate number of viral copies (<250 copies / mL). A negative result must be combined with clinical observations, patient history, and epidemiological information.  Fact Sheet for Patients:   StrictlyIdeas.no  Fact Sheet for Healthcare Providers: BankingDealers.co.za  This test is not yet approved or  cleared by the [place] FDA and has been authorized for detection and/or diagnosis of SARS-CoV-2 by FDA under an Emergency Use Authorization (EUA).  This EUA will remain in effect (meaning this test can be used) for the duration of the COVID-19 declaration under Section 564(b)(1) of the Act, 21 U.S.C. section 360bbb-3(b)(1), unless the authorization is terminated or revoked sooner.  Performed at [facility], [address] [place], [place] [address]   MRSA PCR Screening     Status: None   Collection Time: 08/20/19  5:55 AM   Specimen: Nasopharyngeal  Result Value Ref Range Status   MRSA by PCR NEGATIVE NEGATIVE Final    Comment:        The GeneXpert MRSA Assay (FDA approved for NASAL specimens only), is one component of a comprehensive MRSA colonization surveillance program. It is not intended to diagnose MRSA infection nor to guide or monitor treatment for MRSA infections. Performed at [facility], [address] [place], [place] [address]   CULTURE, BLOOD (ROUTINE X 2) w Reflex to ID Panel     Status: None   Collection Time: 08/23/19 10:33 AM   Specimen: BLOOD  Result Value Ref Range Status   Specimen Description BLOOD LAC  Final   Special Requests   Final    BOTTLES DRAWN AEROBIC AND  ANAEROBIC Blood Culture adequate volume   Culture   Final    NO GROWTH 5 DAYS Performed at [facility], [address] [place], [place] [address]    Report Status 08/28/2019 FINAL  Final  CULTURE, BLOOD (ROUTINE X 2) w Reflex to ID Panel     Status: None   Collection Time: 08/23/19 10:34 AM   Specimen: BLOOD  Result Value Ref Range Status   Specimen Description BLOOD LFA  Final   Special Requests   Final    BOTTLES DRAWN AEROBIC AND ANAEROBIC Blood Culture adequate volume   Culture   Final    NO GROWTH 5 DAYS Performed at [facility], [address] [place],  [address]    Report Status 08/28/2019 FINAL  Final  Aerobic/Anaerobic Culture (surgical/deep wound)     Status: None (Preliminary result)   Collection Time: 08/25/19 12:52 PM   Specimen: Abscess  Result Value Ref Range Status   Specimen Description ABSCESS  Final   Special Requests post CT guided paraspinal drain  Final   Gram Stain   Final    ABUNDANT WBC PRESENT, PREDOMINANTLY PMN NO ORGANISMS SEEN    Culture   Final    FEW PROTEUS MIRABILIS NO ANAEROBES ISOLATED; CULTURE IN PROGRESS FOR 5 DAYS    Report Status PENDING  Incomplete   Organism ID, Bacteria PROTEUS MIRABILIS  Final      Susceptibility   Proteus mirabilis - MIC*    AMPICILLIN <=2 SENSITIVE Sensitive     CEFAZOLIN <=4 SENSITIVE Sensitive     CEFEPIME <=0.12 SENSITIVE Sensitive     CEFTAZIDIME <=1 SENSITIVE Sensitive     CEFTRIAXONE <=0.25 SENSITIVE Sensitive     CIPROFLOXACIN <=0.25 SENSITIVE Sensitive     GENTAMICIN <=1 SENSITIVE Sensitive     IMIPENEM 2 SENSITIVE Sensitive     TRIMETH/SULFA <=20 SENSITIVE Sensitive     AMPICILLIN/SULBACTAM <=2 SENSITIVE Sensitive     PIP/TAZO Value in next row Sensitive      <=4 SENSITIVEPerformed at [facility] [address], [place], [place] [address]    * FEW PROTEUS MIRABILIS  Culture, blood (Routine X 2) w Reflex to ID Panel     Status: None (Preliminary result)    Collection Time: 08/27/19  3:21 PM   Specimen: BLOOD  Result Value Ref Range Status   Specimen Description BLOOD BLOOD LEFT HAND  Final   Special Requests   Final    BOTTLES DRAWN AEROBIC ONLY Blood Culture results may not be optimal due to an inadequate volume of blood received in culture bottles   Culture   Final    NO GROWTH < 24 HOURS Performed at [facility], [address] [place], [place] [address]    Report Status PENDING  Incomplete  Culture, blood (Routine X 2) w Reflex to ID Panel     Status: None (Preliminary result)   Collection Time: 08/27/19  3:21 PM   Specimen: BLOOD  Result Value Ref Range Status   Specimen Description BLOOD LEFT ANTECUBITAL  Final   Special Requests   Final    BOTTLES DRAWN AEROBIC ONLY Blood Culture adequate volume   Culture   Final    NO GROWTH < 24 HOURS Performed at [facility], [address], [place], [place] [address]    Report Status PENDING  Incomplete      Imaging Studies   MR Lumbar Spine W Wo Contrast  Result Date: 08/28/2019 CLINICAL DATA:  Lumbar discitis. Percutaneous drainage of left iliopsoas abscess. EXAM: MRI LUMBAR SPINE WITHOUT AND WITH CONTRAST TECHNIQUE: Multiplanar and multiecho pulse sequences of the lumbar spine were obtained without and with intravenous contrast. CONTRAST:  12m GADAVIST GADOBUTROL 1 MMOL/ML IV SOLN COMPARISON:  MRI lumbar spine without contrast 08/24/2019 FINDINGS: Segmentation:  Normal Alignment:  Lumbar levoscoliosis. Mild retrolisthesis L1-2, L2-3. 12 mm anterolisthesis L5-S1 is unchanged. Vertebrae: Superior endplate fracture of TU27with mild edema. No significant enhancement. Probable healing fracture. Adjacent large hemangioma T12 vertebral body on the left. Small hemangioma L1 vertebral body. Abnormal disc space at L3-4 with inferior endplate depression of L3 unchanged. Moderate depression of the superior endplate of L4 has progressed in the interval. There is edema in the disc  without significant disc fluid or enhancement. There is bone marrow edema surrounding the L3-4 disc space. There is a large right facet joint effusion with enhancement of the facet joint and surrounding soft tissue suggestive of infection. Right facet joint effusion  has progressed in the interval. Conus medullaris and cauda equina: Conus extends to the L2-3 level. Conus and cauda equina appear normal. Paraspinal and other soft tissues: Bilateral psoas edema most consistent with myositis. Right psoas fluid collection has resolved following percutaneous drainage. Small loculated fluid collection in the left psoas muscle has progressed in the interval likely abscess. Posterior epidural fluid collection at the L3-4 level is likely an abscess and unchanged. Disc levels: T12-L1: Negative for stenosis L1-2: Asymmetric disc degeneration on the right with spurring and mild subarticular stenosis on the right L2-3: Advanced disc degeneration asymmetric on the right with disc space narrowing and spurring. Moderate to advanced facet degeneration bilaterally. Moderate to severe subarticular and foraminal stenosis on the right. Mild to moderate subarticular stenosis on the left and mild spinal stenosis. L3-4: Disc degeneration with disc bulging and central disc protrusion unchanged. There is no discal enhancement or discal fluid which typically is seen with disc infection. However there is considerable surrounding soft tissue swelling and bilateral psoas abscess compatible with infection. There is progressive right facet joint effusion with enhancement of the facet joint and surrounding soft tissue suggestive of infection. There is a posterior epidural fluid collection on the right measuring approximately 6 x 15 mm which is unchanged. Probable abscess. Mild spinal stenosis has improved in the interval. L4-5: Mild facet degeneration bilaterally.  No significant stenosis L5-S1: Chronic bilateral pars defects of L5 with 12 mm  anterolisthesis. There is compression of the left L5 nerve root in the foramen unchanged from the prior study. Right foramen patent. IMPRESSION: 1. Fracture of the inferior endplate of L3 is unchanged. Fracture of the superior endplate of L4 shows mild interval progression. 2. 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 Progression of left psoas abscess and improvement in right psoas abscess since prior drainage. There is a posterior epidural fluid collection on the right at L3-4 compatible with abscess. This is likely rising from infected right facet joint. In addition, there is probable infection in the L3-4 disc space. 3. Scoliosis and degenerative changes throughout the lumbar spine as above. Electronically Signed   By: Franchot Gallo M.D.   On: 08/28/2019 15:00   ECHOCARDIOGRAM COMPLETE  Result Date: 08/28/2019    ECHOCARDIOGRAM REPORT   Patient Name:   SHARNEE DOUGLASS [facility] Date of Exam: 08/27/2019 Medical Rec #:  [id number]      Height:       64.0 in Accession #:    [id number]     Weight:       160.1 lb Date of Birth:  1950/04/26     BSA:          1.780 m Patient Age:    45 years       BP:           113/59 mmHg Patient Gender: F              HR:           81 bpm. Exam Location:  [facility] Procedure: 2D Echo, Cardiac Doppler and Color Doppler Indications:     Sepsis  History:         Patient has no prior history of Echocardiogram examinations.                  Risk Factors:Hypertension, Diabetes and Dyslipidemia. Anxiety.                  Thyroid disease. Sleep apnea.  Parkinson's disease.  Sonographer:     Wilford Sports Rodgers-Jones Referring Phys:  [place] Diagnosing Phys: Bartholome Bill MD IMPRESSIONS  1. Left ventricular ejection fraction, by estimation, is 60 to 65%. The left ventricle has normal function. The left ventricle has no regional wall motion abnormalities. Left ventricular diastolic parameters were normal.  2. Right ventricular systolic function  is normal. The right ventricular size is normal. There is normal pulmonary artery systolic pressure.  3. The mitral valve is grossly normal. No evidence of mitral valve regurgitation.  4. The aortic valve is grossly normal. Aortic valve regurgitation is not visualized. FINDINGS  Left Ventricle: Left ventricular ejection fraction, by estimation, is 60 to 65%. The left ventricle has normal function. The left ventricle has no regional wall motion abnormalities. The left ventricular internal cavity size was normal in size. There is  no left ventricular hypertrophy. Left ventricular diastolic parameters were normal. Right Ventricle: The right ventricular size is normal. No increase in right ventricular wall thickness. Right ventricular systolic function is normal. There is normal pulmonary artery systolic pressure. The tricuspid regurgitant velocity is 2.54 m/s, and  with an assumed right atrial pressure of 10 mmHg, the estimated right ventricular systolic pressure is 16.1 mmHg. Left Atrium: Left atrial size was normal in size. Right Atrium: Right atrial size was normal in size. Pericardium: There is no evidence of pericardial effusion. Mitral Valve: The mitral valve is grossly normal. No evidence of mitral valve regurgitation. Tricuspid Valve: The tricuspid valve is not well visualized. Tricuspid valve regurgitation is mild. Aortic Valve: The aortic valve is grossly normal. Aortic valve regurgitation is not visualized. Pulmonic Valve: The pulmonic valve was not well visualized. Pulmonic valve regurgitation is not visualized. Aorta: The aortic root is normal in size and structure. IAS/Shunts: The interatrial septum was not assessed.  LEFT VENTRICLE PLAX 2D LVIDd:         3.63 cm  Diastology LVIDs:         2.26 cm  LV e' lateral:   11.10 cm/s LV PW:         1.08 cm  LV E/e' lateral: 5.9 LV IVS:        1.02 cm  LV e' medial:    7.96 cm/s LVOT diam:     2.10 cm  LV E/e' medial:  8.2 LV SV:         88 LV SV Index:   49 LVOT  Area:     3.46 cm  RIGHT VENTRICLE RV Basal diam:  2.98 cm RV S prime:     16.70 cm/s TAPSE (M-mode): 2.1 cm LEFT ATRIUM             Index       RIGHT ATRIUM          Index LA diam:        4.40 cm 2.47 cm/m  RA Area:     9.99 cm LA Vol (A2C):   41.9 ml 23.54 ml/m RA Volume:   22.40 ml 12.59 ml/m LA Vol (A4C):   24.3 ml 13.65 ml/m LA Biplane Vol: 32.1 ml 18.04 ml/m  AORTIC VALVE LVOT Vmax:   127.00 cm/s LVOT Vmean:  95.700 cm/s LVOT VTI:    0.253 m  AORTA Ao Root diam: 3.60 cm MITRAL VALVE               TRICUSPID VALVE MV Area (PHT): 2.26 cm    TR Peak grad:   25.8 mmHg MV Decel  Time: 335 msec    TR Vmax:        254.00 cm/s MV E velocity: 65.60 cm/s MV A velocity: 97.90 cm/s  SHUNTS MV E/A ratio:  0.67        Systemic VTI:  0.25 m                            Systemic Diam: 2.10 cm Bartholome Bill MD Electronically signed by Bartholome Bill MD Signature Date/Time: 08/28/2019/8:38:16 AM    Final      Medications   Scheduled Meds: . ALPRAZolam  0.25 mg Oral q morning - 10a  . calcium-vitamin D  1 tablet Oral Daily  . carbidopa-levodopa  1 tablet Oral BID  . Chlorhexidine Gluconate Cloth  6 each Topical Q0600  . citalopram  40 mg Oral Daily  . docusate sodium  100 mg Oral BID  . enoxaparin (LOVENOX) injection  40 mg Subcutaneous Q24H  . feeding supplement (ENSURE ENLIVE)  237 mL Oral TID BM  . insulin aspart  0-15 Units Subcutaneous TID WC  . insulin aspart  0-5 Units Subcutaneous QHS  . levothyroxine  175 mcg Oral Q0600  . megestrol  40 mg Oral Daily  . mirtazapine  7.5 mg Oral QHS  . multivitamin with minerals  1 tablet Oral Daily  . pantoprazole  40 mg Oral Daily  . rOPINIRole  0.5 mg Oral BID  . rOPINIRole  1 mg Oral QHS  . sertraline  50 mg Oral Daily  . simvastatin  40 mg Oral QHS  . sodium chloride flush  10-40 mL Intracatheter Q12H  . sodium chloride flush  5 mL Intracatheter Q8H  . tamsulosin  0.4 mg Oral QPC supper   Continuous Infusions: . sodium chloride 125 mL/hr at 08/28/19  1839  . cefTRIAXone (ROCEPHIN)  IV Stopped (08/27/19 2258)     LOS: 9 days   Gretta Cool Kaisey Huseby,MD [facility] [phone] 08/28/2019, 7:01 PM    [If 7PM-7AM, please contact night-coverage. How to contact the [facility] Attending or Consulting provider [place] or covering provider during after hours [place], for this patient?    1. Check the care team in [facility] and look for a) attending/consulting [facility] provider listed and b) the [facility] team listed 2. Log into [email or web address] and use [place]'s universal password to access. If you do not have the password, please contact the hospital operator. 3. Locate the [facility] provider you are looking for under [facility] and page to a number that you can be directly reached. 4. If you still have difficulty reaching the provider, please page the [facility] (Director on Call) for the Hospitalists listed on amion for assistance.]

## 2019-08-28 NOTE — Treatment Plan (Signed)
Diagnosis: Proteus bacteremia, discitis, psoas abscess, UTI Baseline Creatinine  0.55    Allergies  Allergen Reactions  . Codeine Other (See Comments)    HALLUCINATIONS  . Ephadrene [Cholestatin] Other (See Comments)    HYPER AND NERVOUS  . Other Other (See Comments)    HYPER  HYPER AND NERVOUS  . Valium [Diazepam] Other (See Comments)    OVERLY SENSITIVE/TOO STRONG    OPAT Orders Discharge antibiotics: Ceftriaxone 2 grams IV every 24 hrs until End Date: Sept 13 , 2021 -- 10/06/19  [facility] Care Per Protocol:including placement of biopatch  Labs weekly on Mondays while on IV antibiotics: _X_ CBC with differential  _X_ CMP _X_ CRP _X_ ESR   _X_ Please pull PIC at completion of IV antibiotics   Fax weekly labs to [phone]  Clinic Follow Up Appt:Sept 2nd at 10am Call [phone] with any questions

## 2019-08-28 NOTE — Progress Notes (Signed)
[place] visited pt. while rounding on 2A; pt. lying down in bed very still, complained of severe pain in her lower back and asked for something to alleviate this; [facility] informed RN who came shortly afterwards with medication. Pt. tearful speaking of her intense grief for the loss of her husband in 2009; husband was killed in a car accident by 'a careless woman' and  this loss continues to weigh heavily upon Pt., even though she shared that prayer and the support of several key friends and a counselor were very helpful in getting her through the initial shock.  Pt. shared that she met her husband, an organic chemistry professor at [employer], while they were both at college and he noticed that she was not as enamored with his accomplishments as many of the other girls in their class; Pt. shared several examples of her own departure from norms and expectations for females in her family, including her love for reading and school rather than 'sewing' and her willingness to push the boundaries by occasionally having a drink.  Pt. spoke warmly of a NT on 2A who had been willing to let her grasp her hand when she experienced a bout of especially intense pain.  [place] provided prayer at the end of visit; pt. grateful for [facility] support; [place] will attempt follow-up visit this weekend if possible.

## 2019-08-28 NOTE — Consult Note (Addendum)
Referring Physician:  No referring provider defined for this encounter.  Primary Physician:  Ronnell Freshwater, NP  Chief Complaint:  Consult to neurosurgery due to evidence of osteomyelitis, discitis.   History of Present Illness: Theresa Mills is a 69 y.o. ill-appearing female who is currently admitted to [facility] with a diagnosis of osteomyelitis and discitis.  Per Dr. Serita Grit note from yesterday, 08/27/2019 "Theresa Mills a 69 y.o.femalewith medical history of DM, GERD, anxiety, OSA, Parkinson's with dementia, hypothyroidism, frequent falls, brought in 08/19/19 by EMS after SNF staff called due to shortness of breath and altered mental status".  While hospitalized, she did have an MRI which showed L3-L4 osteomyelitis and discitis as well as epidural abscess.  Additionally, she was shown to have a right psoas abscess.  ID had been consulted, and IR completed a CT-guided aspiration of the psoas abscess. Cultures of the psoas abscess appear to be growing Proteus.  IDs current recommendations are to continue on Rocephin x6 weeks.  Notably, her MRI also showed a previously identified compression fracture at the superior endplate of L4 and subacute compression fracture extending through the inferior endplate of L3.    [name] is a poor historian, however she is able to cooperate in her exam and follow commands, as well as to report that she is having severe pain in her right hip down through her right leg and thigh.  Her lower extremity motor exam is intact however, her right lower extremity exam is severely limited by right hip and thigh pain. She denies numbness and tingling to lower extremities.  She was noted to have urinary retention on admission however her MRI showed a normal conus medullaris as well as normal cauda equina.  A urology consult was placed and per their note, "Given her history of Parkinson's dementia and diabetes, she is at increased risk of neurogenic bladder.  Unclear if  her current presentation reflects acute urinary retention in the setting of infection versus acute on chronic retention with underlying neurogenic bladder". Her rectal tone is intact and she denies any saddle anesthesia.   She reports that she does not get OOB due to pain prior to being hospitalized.   Review of Systems:  A 10 point review of systems is negative, except for the pertinent positives and negatives detailed in the HPI.  Past Medical History: Past Medical History:  Diagnosis Date  . Anxiety   . Diabetes ([place])   . Difficult intravenous access   . GERD (gastroesophageal reflux disease)   . HBP (high blood pressure)   . Hyperlipidemia   . Osteoarthritis   . Parkinson disease ([place])   . Sleep apnea   . Thyroid disease     Past Surgical History: Past Surgical History:  Procedure Laterality Date  . ABDOMINAL HYSTERECTOMY    . CATARACT EXTRACTION Bilateral   . CHOLECYSTECTOMY    . COLONOSCOPY WITH PROPOFOL N/A 04/02/2015   Procedure: COLONOSCOPY WITH PROPOFOL;  Surgeon: Josefine Class, MD;  Location[facility] ENDOSCOPY;  Service: Endoscopy;  Laterality: N/A;  . ESOPHAGOGASTRODUODENOSCOPY (EGD) WITH PROPOFOL N/A 04/02/2015   Procedure: ESOPHAGOGASTRODUODENOSCOPY (EGD) WITH PROPOFOL;  Surgeon: Josefine Class, MD;  Location: [facility] ENDOSCOPY;  Service: Endoscopy;  Laterality: N/A;  . GALLBLADDER SURGERY    . LAPAROSCOPIC HYSTERECTOMY    . THYROIDECTOMY Left   . TONSILLECTOMY    . TONSILLECTOMY Bilateral     Allergies: Allergies as of 08/19/2019 - Review Complete 08/19/2019  Allergen Reaction Noted  . Codeine Other (  See Comments) 11/20/2012  . Ephadrene [cholestatin] Other (See Comments) 11/20/2012  . Other Other (See Comments) 11/20/2012  . Valium [diazepam] Other (See Comments) 11/20/2012    Medications:  Current Facility-Administered Medications:  .  0.9 %  sodium chloride infusion, , Intravenous, PRN, Para Skeans, MD, Last Rate: 125 mL/hr at 08/27/19 2227,  New Bag at 08/27/19 2227 .  acetaminophen (TYLENOL) tablet 650 mg, 650 mg, Oral, Q6H PRN, 650 mg at 08/24/19 2200 **OR** acetaminophen (TYLENOL) suppository 650 mg, 650 mg, Rectal, Q6H PRN, Athena Masse, MD .  ALPRAZolam Duanne Moron) tablet 0.25 mg, 0.25 mg, Oral, q morning - 10a, Nicole Kindred A, DO, 0.25 mg at 08/28/19 3710 .  ALPRAZolam Duanne Moron) tablet 0.5 mg, 0.5 mg, Oral, QHS PRN, Nicole Kindred A, DO, 0.5 mg at 08/22/19 2003 .  calcium-vitamin D (OSCAL WITH D) 500-200 MG-UNIT per tablet 1 tablet, 1 tablet, Oral, Daily, Nicole Kindred A, DO, 1 tablet at 08/28/19 0824 .  carbidopa-levodopa (SINEMET CR) 50-200 MG per tablet controlled release 1 tablet, 1 tablet, Oral, BID, Ezekiel Slocumb, DO, 1 tablet at 08/28/19 0825 .  cefTRIAXone (ROCEPHIN) 2 g in sodium chloride 0.9 % 100 mL IVPB, 2 g, Intravenous, Q24H, Ravishankar, Jayashree, MD, Last Rate: 200 mL/hr at 08/27/19 2228, 2 g at 08/27/19 2228 .  celecoxib (CELEBREX) capsule 200 mg, 200 mg, Oral, BID PRN, Nicole Kindred A, DO, 200 mg at 08/28/19 0943 .  Chlorhexidine Gluconate Cloth 2 % PADS 6 each, 6 each, Topical, Q0600, Ezekiel Slocumb, DO, 6 each at 08/27/19 0550 .  citalopram (CELEXA) tablet 40 mg, 40 mg, Oral, Daily, Nicole Kindred A, DO, 40 mg at 08/28/19 0824 .  docusate sodium (COLACE) capsule 100 mg, 100 mg, Oral, BID, Nicole Kindred A, DO, 100 mg at 08/28/19 0824 .  enoxaparin (LOVENOX) injection 40 mg, 40 mg, Subcutaneous, Q24H, Nicole Kindred A, DO, 40 mg at 08/27/19 2205 .  feeding supplement (ENSURE ENLIVE) (ENSURE ENLIVE) liquid 237 mL, 237 mL, Oral, TID BM, Griffith, Kelly A, DO, 237 mL at 08/28/19 1256 .  HYDROcodone-acetaminophen (NORCO/VICODIN) 5-325 MG per tablet 1-2 tablet, 1-2 tablet, Oral, Q4H PRN, Athena Masse, MD, 1 tablet at 08/28/19 [phone] .  insulin aspart (novoLOG) injection 0-15 Units, 0-15 Units, Subcutaneous, TID WC, Athena Masse, MD, 3 Units at 08/28/19 1256 .  insulin aspart (novoLOG) injection 0-5  Units, 0-5 Units, Subcutaneous, QHS, Judd Gaudier V, MD .  levothyroxine (SYNTHROID) tablet 175 mcg, 175 mcg, Oral, Q0600, Ezekiel Slocumb, DO, 175 mcg at 08/28/19 0506 .  megestrol (MEGACE) tablet 40 mg, 40 mg, Oral, Daily, Nicole Kindred A, DO, 40 mg at 08/28/19 4854 .  melatonin tablet 5 mg, 5 mg, Oral, QHS PRN, Nicole Kindred A, DO, 5 mg at 08/27/19 2203 .  mirtazapine (REMERON) tablet 7.5 mg, 7.5 mg, Oral, QHS, Nicole Kindred A, DO, 7.5 mg at 08/27/19 2204 .  multivitamin with minerals tablet 1 tablet, 1 tablet, Oral, Daily, Ezekiel Slocumb, DO, 1 tablet at 08/28/19 6270 .  ondansetron (ZOFRAN) tablet 4 mg, 4 mg, Oral, Q6H PRN **OR** ondansetron (ZOFRAN) injection 4 mg, 4 mg, Intravenous, Q6H PRN, Athena Masse, MD .  pantoprazole (PROTONIX) EC tablet 40 mg, 40 mg, Oral, Daily, Nicole Kindred A, DO, 40 mg at 08/28/19 0824 .  rOPINIRole (REQUIP) tablet 0.5 mg, 0.5 mg, Oral, BID, Nicole Kindred A, DO, 0.5 mg at 08/28/19 0824 .  rOPINIRole (REQUIP) tablet 1 mg, 1 mg, Oral, QHS, Ezekiel Slocumb,  DO, 1 mg at 08/27/19 2203 .  sertraline (ZOLOFT) tablet 50 mg, 50 mg, Oral, Daily, Nicole Kindred A, DO, 50 mg at 08/28/19 6433 .  simvastatin (ZOCOR) tablet 40 mg, 40 mg, Oral, QHS, Nicole Kindred A, DO, 40 mg at 08/27/19 2204 .  sodium chloride flush (NS) 0.9 % injection 10-40 mL, 10-40 mL, Intracatheter, Q12H, Para Skeans, MD, 10 mL at 08/28/19 0826 .  sodium chloride flush (NS) 0.9 % injection 10-40 mL, 10-40 mL, Intracatheter, PRN, Florina Ou V, MD .  sodium chloride flush (NS) 0.9 % injection 5 mL, 5 mL, Intracatheter, Q8H, Sandi Mariscal, MD, 5 mL at 08/28/19 1257 .  tamsulosin (FLOMAX) capsule 0.4 mg, 0.4 mg, Oral, QPC supper, Nicole Kindred A, DO, 0.4 mg at 08/27/19 1738   Social History: Social History   Tobacco Use  . Smoking status: Never Smoker  . Smokeless tobacco: Never Used  Substance Use Topics  . Alcohol use: No  . Drug use: No    Family Medical  History: Family History  Problem Relation Age of Onset  . Asthma Father   . Cancer Sister   . Stroke Maternal Uncle   . Heart disease Maternal Uncle   . Diabetes Maternal Uncle     Physical Examination: Vitals:   08/28/19 0505 08/28/19 0824  BP: 121/63   Pulse: 69 70  Resp: 16   Temp: 97.7 F (36.5 C) 98 F (36.7 C)  SpO2: 97% 95%     General: Patient is ill appearing  Psychiatric: Patient is non-anxious.  Head:  Pupils equal, round, and reactive to light.  ENT:  Oral mucosa appears well hydrated.  Neck:   Full ROM.   NEUROLOGICAL:  General: Ill appearing   Awake, alert, oriented to person, place, and time. Facial tone is symmetric.    ROM of spine: Deferred as pt laying in bed. Palpation of spine: TTP over lumbar spine  Strength: Side Biceps Triceps Deltoid Interossei Grip  R 5 5 5 5 5   L 5 5 5 5 5    Side Iliopsoas Quads Hamstring PF DF EHL  R 5 5 5 5 5 5   L 5 5 5 5 5 5    Reflexes are 1+ and symmetric at the patella and achilles.   Bilateral upper and lower extremity sensation is intact to light touch.  Clonus is not present.  Hoffman's is absent. Gait deferred as pt reports that she has not been ambulatory.   Imaging: MRI IMPRESSION: 1. Findings concerning for osteomyelitis discitis involving the L3-4 interspace as above. Associated epidural abscess at the L3-4 level with resultant moderate to severe spinal stenosis. 2. Abnormal edema with joint effusions involving the bilateral L3-4 facets, concerning for concomitant septic arthritis. 3. Associated paraspinous inflammation centered about the L3-4 interspace with superimposed 1.7 x 1.7 x 4.9 cm collection within the right psoas muscle, likely abscess. 4. Superimposed acute to subacute compression deformities involving the inferior endplate of I9-51% height loss with 2 mm bony retropulsion, and superior endplate of O8-41% height loss with 3 mm bony retropulsion. 5. Additional fluid signal intensity  within the L1-2 and L2-3 interspaces, favored to be degenerative, although additional levels of discitis somewhat difficult to exclude. Attention at follow-up recommended. 6. Chronic bilateral pars defects at L5-S1 with associated 8 mm spondylolisthesis of L5 on S1 with moderate to severe left L5 foraminal stenosis. 7. Underlying moderate levoscoliosis with additional moderate multilevel degenerative spondylosis elsewhere as above.    Assessment and Plan: Ms. Kelsay is  a pleasant 69 y.o. female with osteomyelitis discitis involving the L3-4 Interspace, associated epidural abscess at the L3-4 level, and acute to subacute compression deformities involving the inferior endplate of Q6-04% height loss with 2 mm bony retropulsion, and superior endplate of N9-98% height loss with 3 mm bony retropulsion.  Regarding her osteomyelitis, discitis and epidural abscess at the L3-4 level, we recommend continued management by ID at this time.  As her lower extremity exam remains intact, there is no acute neurosurgical intervention recommended.  She has been evaluated by urology regarding her urinary retention, which she reports is long standing, and her rectal tone is intact and she denies any saddle anesthesia.   Regarding her acute to subacute compression deformities, we do recommend application of a TLSO brace and we will arrange follow-up with [place] within one 1 month for continued surveillance of these compression deformities.  We thank you for this consult and are available for further assistance if needed.  Lonell Face, NP Dept. of Neurosurgery

## 2019-08-28 NOTE — Care Management Important Message (Signed)
Important Message  Patient Details  Name: Theresa Mills MRN: [id number] Date of Birth: Jan 01, 1951   Medicare Important Message Given:  Yes     Shelbie Ammons, RN 08/28/2019, 12:44 PM

## 2019-08-28 NOTE — Progress Notes (Signed)
Patient was in pain breathing labored. Gave her pain meds. She is breathing better.

## 2019-08-29 DIAGNOSIS — D649 Anemia, unspecified: Secondary | ICD-10-CM

## 2019-08-29 LAB — GLUCOSE, CAPILLARY
Glucose-Capillary: 103 mg/dL — ABNORMAL HIGH (ref 70–99)
Glucose-Capillary: 178 mg/dL — ABNORMAL HIGH (ref 70–99)
Glucose-Capillary: 133 mg/dL — ABNORMAL HIGH (ref 70–99)
Glucose-Capillary: 117 mg/dL — ABNORMAL HIGH (ref 70–99)

## 2019-08-29 LAB — SEDIMENTATION RATE: Sed Rate: 71 mm/hr — ABNORMAL HIGH (ref 0–30)

## 2019-08-29 LAB — C-REACTIVE PROTEIN: CRP: 2.2 mg/dL — ABNORMAL HIGH (ref <1.0)

## 2019-08-29 MED ORDER — HYDROMORPHONE HCL 1 MG/ML IJ SOLN: 1.0000 mg | Freq: Two times a day (BID) | INTRAMUSCULAR | Status: DC | PRN

## 2019-08-29 MED ORDER — HYDROCORTISONE 1 % EX CREA
1.0000 "application " | TOPICAL_CREAM | Freq: Three times a day (TID) | CUTANEOUS | Status: DC | PRN
  Filled 2019-08-29: qty 28

## 2019-08-29 MED ORDER — HYDROMORPHONE HCL 1 MG/ML IJ SOLN
1.0000 mg | Freq: Four times a day (QID) | INTRAMUSCULAR | Status: DC | PRN
  Administered 2019-08-29: 1 mg via INTRAVENOUS
  Filled 2019-08-29: qty 1

## 2019-08-29 MED ORDER — SENNA 8.6 MG PO TABS
1.0000 | ORAL_TABLET | Freq: Every day | ORAL | Status: DC
  Administered 2019-08-29 – 2019-09-03 (×5): 8.6 mg via ORAL
  Filled 2019-08-29 (×6): qty 1

## 2019-08-29 MED ORDER — SODIUM CHLORIDE 0.9 % IV SOLN: INTRAVENOUS | Status: DC

## 2019-08-29 NOTE — Progress Notes (Signed)
ID Theresa Mills is a 69 y.o.female  with a history ofDiabetes mellitus, Parkinson's disease, hypothyroidism, GERD, anxiety, who lives at home with home health was recently in the hospital between 08/05/19 until 08/09/2019 for multiple falls at home.  She had acute rhabdomyolysis.  She was discharged to nursing home. She presented from [facility] on 08/19/2019 with shortness of breath and altered mental status.  She was also complaining of abdominal pain and neck pain.  In the ED she was lethargic, blood pressure of 83/41, heart rate of 90, sats of 93% on room air.  WBC was 18,000, lactate of 2.5, creatinine was 2.43 with a baseline of 0.69.  Pro-Cal was 4.62.  CT of the head and cervical spine showed no acute injury.  CT abdomen and pelvis showed distended urinary bladder with bilateral hydronephrosis and findings concerning for pyelonephritis.  There were several nonobstructing left renal calculi.  She was given IV fluids and started on IV ceftriaxone after sending blood cultures.  A Foley catheter was placed.  Blood culture and urine culture grew Proteus which was pansensitive.  She received an MRI lumbar spine to rule out cauda equina syndrome.  MRI was abnormal and it showed abnormal fluid signal intensity within the L3-L4 interspace with associated marrow edema within the adjacent endplates of L3 and L4.  There was associated paraspinous edema within the adjacent psoas musculature.  This was concerning for possible osteomyelitis and discitis.  There was also a previously identified compression fracture at the superior endplate of L4 which was seen again.  There was subacute compression fracture extending through the inferior endplate of L3.  There was also marrow edema around the right greater than left L3-L4 facets.  All this was concerning for osteomyelitis discitis involving L3 and L4, associated epidural abscess at the L3-L4 level with resultant moderate to severe spinal stenosis.  There was  abnormal edema with joint effusions involving the bilateral L3-L4 facets concerning for septic arthritis.  There was also associated paraspinous inflammation centered around the L3-L4 interspace with 1.7 into 1.7 to 4.9 cm collection within the right psoas muscle. A drain was placed by IR in the rt psoas abscess collection on 08/26/19 Repeat MRI done on 08/28/19 showed . Fracture of the inferior endplate of L3 is unchanged. Fracture of the superior endplate of L4 shows mild interval progression.2. 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 Progression of left psoas abscess and improvement in right psoas abscess since prior drainage.There is a posterior epidural fluid collection on the right at L3-4 compatible with abscess. This is likely rising from infected right facet joint. In addition, there is probable infection in the L3-4 disc space. Neurosurgery evaluated the patient and did not think she needed any surgery   O/e  Pt awake , sallow complexion,tired Answers appropriately to all questions Says she is slightly better than before She has no participated in PT yet  Patient Vitals for the past 24 hrs:  BP Temp Temp src Pulse Resp SpO2  08/29/19 1700 -- -- -- -- 15 --  08/29/19 1600 105/86 -- -- -- (!) 21 --  08/29/19 1500 -- -- -- -- 19 --  08/29/19 1400 -- -- -- -- 15 --  08/29/19 1300 -- -- -- -- 13 --  08/29/19 1200 (!) 99/57 -- -- -- (!) 22 --  08/29/19 1100 -- -- -- -- 12 --  08/29/19 1000 119/70 -- -- -- 15 --  08/29/19  0900 -- -- -- 70 19 94 %  08/29/19 0810 122/65 97.8 F (36.6 C) -- -- -- --  08/29/19 0800 (!) 134/107 -- -- 67 15 94 %  08/29/19 0700 -- -- -- 68 (!) 21 94 %  08/29/19 0600 124/68 -- -- 73 (!) 24 94 %  08/29/19 0500 130/67 -- -- 67 18 91 %  08/29/19 0436 134/69 -- -- 71 (!) 26 92 %  08/28/19 2120 116/61 97.8 F (36.6 C) Oral 73 18 95 %  08/28/19 2100 109/60 -- -- -- 17 --  08/28/19 2000 (!) 129/116 -- --  -- 18 --  08/28/19 1941 118/67 98.1 F (36.7 C) Oral 78 (!) 28 96 %  08/28/19 1939 -- 98.1 F (36.7 C) Oral -- -- --  08/28/19 1900 106/60 -- -- -- 16 --   Chest b/l air entry HS [id number] Generalized weakness Rt back drain- bul has serosanguinous fluid    CBC Latest Ref Rng & Units 08/28/2019 08/27/2019 08/26/2019  WBC 4.0 - 10.5 K/uL 11.5(H) 11.1(H) 11.2(H)  Hemoglobin 12.0 - 15.0 g/dL 10.6(L) 10.2(L) 10.5(L)  Hematocrit 36 - 46 % 33.1(L) 30.5(L) 30.8(L)  Platelets 150 - 400 K/uL 345 359 319    CMP Latest Ref Rng & Units 08/28/2019 08/27/2019 08/26/2019  Glucose 70 - 99 mg/dL 129(H) 113(H) 145(H)  BUN 8 - 23 mg/dL 26(H) 24(H) 23  Creatinine 0.44 - 1.00 mg/dL 0.55 0.57 0.70  Sodium 135 - 145 mmol/L 138 139 138  Potassium 3.5 - 5.1 mmol/L 4.1 3.9 3.9  Chloride 98 - 111 mmol/L 105 107 106  CO2 22 - 32 mmol/L 23 23 24   Calcium 8.9 - 10.3 mg/dL 8.0(L) 7.9(L) 8.1(L)  Total Protein 6.5 - 8.1 g/dL 6.8 6.6 -  Total Bilirubin 0.3 - 1.2 mg/dL 0.5 0.6 -  Alkaline Phos 38 - 126 U/L 70 72 -  AST 15 - 41 U/L 58(H) 68(H) -  ALT 0 - 44 U/L 12 26 -   08/19/19 BC- proteus 7/27 UC- proteus 8/2 -psoas abscess- proteus 8/4 BC-ng  Impression/recommendation   69 year old female with history of Parkinson's disease, neurocognitive impairment, frequent falls, presents from nursing home.  Proteus bacteremia secondary to Proteus urinary tract infection.  Urinary retention leading to bilateral hydronephrosis.  The reason for urinary retention is unclear.  Rule out neurogenic bladder.  Or medication induced.has foley  AKI secondary to urinary retention- resolved  L3-L4 discitis/osteomyelitis, right psoas, left small abscess, L3-L4 facet joint arthritis/fluid. The abscess was aspirated and showed proteus  On ceftriaxone - Seen by neurosurgery and no intervention needed for any of the findings  As repeat MRI shows improvement in rt psoas collection drain should be removed if < 5cc  Continue ceftriaxone  for atleast 6 weeks  Anemia  Parkinson's disease on  meds  Restless leg syndrome on Requip  Hypothyroidism on Synthroid  Pt needs PT   Discussed the management with patient and hospitalist ID will follow her peripherally this weekend.

## 2019-08-29 NOTE — Progress Notes (Signed)
PROGRESS NOTE    SINDIA KOWALCZYK   EPP:[id number]  DOB: 30-Mar-1950  PCP: Ronnell Freshwater, NP    DOA: 08/19/2019 LOS: 10   Brief Narrative   Theresa Mills is a 69 y.o. female with medical history of DM, GERD, anxiety, OSA, Parkinson's with dementia, hypothyroidism, frequent falls, brought in 08/19/19 by EMS after SNF staff called due to shortness of breath and altered mental status.  Patient complained of abdominal pain and neck pain but could not provide additional history due to dementia and altered mental status.  In the ED, patient was lethargic, afebrile, hypotensive with BP 83/41, HR 90, RR 24, O2 sat 93% on room air.  Labs were notable for leukocytosis 18k, lactic acidosis 2.5.  UA showed pyuria.  Creatinine was 2.43 (baseline 0.69), procalcitonin 4.62, troponin 28.  CT abdomen pelvis showed distended urinary bladder with bilateral hydronephrosis and findings concerning for pyelonephritis, several nonobstructing left renal calculi.  No focal consolidation was seen on CT chest.  EKG was normal sinus rhythm.  Patient's blood pressure improved with IV fluid bolus.  Started on IV antibiotics and admitted to hospitalist service for further evaluation and management.   Pt today is alert and oriented and denies any complaints. Vitals are stable pt is afebrile.   Assessment & Plan   Principal Problem:   Osteomyelitis of lumbar vertebra (HCC) Active Problems:   Dementia due to Parkinson's disease without behavioral disturbance (HCC)   Diabetes mellitus type 2, uncomplicated (HCC)   Essential hypertension   Pressure injury of coccygeal region, stage 2 (HCC)   Acquired hypothyroidism   Severe sepsis (HCC)   Bilateral hydronephrosis   Acute pyelonephritis   Acute metabolic encephalopathy   Acute urinary retention   AKI (acute kidney injury) ([facility])   Malnutrition of moderate degree   Lumbar compression fracture (HCC)   Lumbar discitis   Lumbar Osteomyelitis / Discitis / Epidural  Abscess / Right psoas abscess - obtained MRI lumbar spine due to ongoing urinary retention, no sign of cauda equina syndrome, but appears to have extensive infection involving levels L3-L4.  ID consulted.   IR did CT-guided aspiration of the psoas abscess, cultures growing Proteus. Will need 6 weeks IV Rocephin 2 g every 24 hours.  Monitor ESR/CRP. --Flush percutaneous drain with 10 cc sterile saline daily. --Continue Rocephin --Place order for PICC line --Pt s/p PICC line placed today in RA for extended duration of iv abx.  --neurosurgery consulted / iv abx continued. -- will sch pt for left psoas drain by CT IR.  Severe sepsis secondary to acute pyelonephritis and bacteremia - present on admission with leukocytosis, tachycardia, hypotension that was fluid responsive, lactic acidosis and AKI.  Imaging findings and UA are consistent with pyelonephritis.   Sepsis physiology has improved and lactic acidosis resolved.  Blood cultures grew Proteus and Enterobacteriaceae spp.  Abscess cultures as above, now on long course Rocephin.   Continue IV antibiotic regimen for ongoing bacteremia and positive blood cultures.  Bilateral hydronephrosis secondary to acute urinary retention -present on admission, CT abdomen showed distended bladder and bilateral hydronephrosis.  Foley catheter removed this AM for voiding trial.  Foley replaced 8/1 (almost 800 cc on bladder scan).  Flomax started 7/29.  MRI lumbar ruled out cauda equina syndrome.   Urology consulted.  Recommend keeping the Foley catheter in place and voiding trial as outpatient in about 2 weeks.  Start patient on IV fluids patient still has persistent pyuria from hydronephrosis.  Acute kidney injury -  Resolved with Foley and IV fluids.  Present on admission with creatinine 2.43, prior baseline 0.69.  Likely multifactorial with sepsis and hypotension causing prerenal azotemia and post renal secondary to hydronephrosis and acute urinary retention.    Improved with Foley and IVF's.  Foley d/c'd for voiding trial, but replaced on 8/1 due to persistent retention.  Daily BMP to monitor.  Avoid nephrotoxins and hypotension.    Lumbar compression fractures- present on admission, seen on CT abdomen/pelvis in the ED, showed compression fractures of the superior endplate of L4 and inferior endplate of L3, likely acute.  Patient has not complained of back pain throughout admission.  MRI obtained, showed infection as above.  No neurosurgery consult for now given infection at that level.    Acute metabolic encephalopathy - Present on admission likely secondary to infection on top of baseline dementia.  Resolved with treatment of infection.    Transaminitis -present on admission, improving.  AST 271>>103, ALT 24>>64, alk phos 137>>94, normal total bili 1.2>>1.0.  RUQ showed liver echogenicity indicative of fatty infiltration, no focal liver lesions, gallbladder absent..  Suspect due to severe sepsis and shock liver.  AST ALT pending today.  Type 2 diabetes -sliding scale NovoLog, A1c 5.6  Dementia due to Parkinson's disease without behavioral disturbance -no acute issues.  Continue Sinemet, ropinirole  Essential hypertension -not on antihypertensives outpatient  Acquired hypothyroidism -continue levothyroxine  Hyperlipidemia -continue simvastatin  Depression/anxiety -continue Zoloft, mirtazapine, Celexa, and Xanax  Insomnia -continue melatonin  Pressure Injury -   Pressure Injury 08/20/19 Coccyx Medial Stage 2 -  Partial thickness loss of dermis presenting as a shallow open injury with a red, pink wound bed without slough. (Active)  08/20/19 0500  Location: Coccyx  Location Orientation: Medial  Staging: Stage 2 -  Partial thickness loss of dermis presenting as a shallow open injury with a red, pink wound bed without slough.  Wound Description (Comments):   Present on Admission: Yes   Patient BMI: Body mass index is 28.01 kg/m.   DVT  prophylaxis:  Lovenox.  Diet:  Diet Orders (From admission, onward)    Start     Ordered   08/29/19 0943  Diet Carb Modified Fluid consistency: Thin; Room service appropriate? Yes  Diet effective now       Question Answer Comment  Diet-HS Snack? Nothing   Calorie Level Medium 1600-2000   Fluid consistency: Thin   Room service appropriate? Yes      08/29/19 0943            Code Status: Full Code    Subjective 08/29/19    Patient seen at bedside this morning.  No acute events reported overnight.  Says she is feeling better today.  Just continues to be very tired and weak feeling.  Denies fevers or chills, chest pain or shortness of breath, nausea vomiting diarrhea or other acute complaints.    08/28/19: Repeat Mri shows: IMPRESSION: 1. Fracture of the inferior endplate of L3 is unchanged. Fracture of the superior endplate of L4 shows mild interval progression. 2. 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 Progression of left psoas abscess and improvement in right psoas abscess since prior drainage. There is a posterior epidural fluid collection on the right at L3-4 compatible with abscess. This is likely rising from infected right facet joint. In addition, there is probable infection in the L3-4 disc space. 3. Scoliosis and degenerative changes throughout the lumbar spine as Above.  Pt is alert and awake and pain is better. Requested neurosurgery to evaluate pt  As well due to discitis. Echo reports as below: 1. Left ventricular ejection fraction, by estimation, is 60 to 65%. The left ventricle has normal function. The left ventricle has no regional wall motion abnormalities. Left ventricular diastolic parameters were normal. 2. Right ventricular systolic function is normal. The right ventricular size is normal. There is normal pulmonary artery systolic pressure. 3. The mitral valve is grossly normal. No evidence of  mitral valve regurgitation. 4. The aortic valve is grossly normal. Aortic valve regurgitation is not visualized.  08/29/19 Pt is alert and awake and d/w her about possible left psoas drain which was not needed after d/w IR Dr.Schick.D/W sister gail about care so far and pt being ill and being stable.  Case also d/w Neurosurgery and ID and no change in current plan pt needs 6 weeks of IV abx.Pt reports pain in left leg and back .  Disposition Plan & Communication   Status is: Inpatient  Remains inpatient appropriate because:IV treatments appropriate due to intensity of illness or inability to take PO.  Continues to have acute urinary retention after voiding trial.  Urology consult pending.  Spinal infection with cultures pending.  Dispo: The patient is from: SNF [facility])              Anticipated d/c is to: SNF [facility])              Anticipated d/c date is: 1-2 days              Patient currently is not medically stable to d/c.   Family Communication:  A/P: Spoke with friend, Vaughan Basta, by phone this afternoon at patient's request.  She and patient's sister do not want her to return to [facility], feel she has not been well cared for there.  Has been to [facility] in the past, and had positive experience there.  Vaughan Basta says patient was ambulatory prior to her admission that led to d/c to rehab at [facility].  Consults, Procedures, Significant Events   Consultants:  Infectious disease. Vascular access team. Case management team.  Procedures:   Foley placed in the ER 7/27, discontinued 7/30 for void trial  Foley replaced 8/1  8/2 CT guided drain catheter placed in right psoas abscess, aspirated for culture  Antimicrobials:   Rocephin 7/27 >> 7/30  Ampicillin 7/30 >>8/3  Rocephin 8/3 >>Active.  Objective   Vitals:   08/29/19 1400 08/29/19 1500 08/29/19 1600 08/29/19 1700  BP:   105/86   Pulse:      Resp: 15 19 (!) 21 15  Temp:      TempSrc:      SpO2:       Weight:      Height:        Intake/Output Summary (Last 24 hours) at 08/29/2019 1833 Last data filed at 08/29/2019 1833 Gross per 24 hour  Intake 3264.84 ml  Output 1725 ml  Net 1539.84 ml   Filed Weights   08/26/19 0428 08/27/19 0448 08/28/19 0508  Weight: 75.1 kg 72.6 kg 74 kg    Physical Exam: Blood pressure 105/86, pulse 70, temperature 97.8 F (36.6 C), resp. rate 15, height 5' 4"  (1.626 m), weight 74 kg, SpO2 94 %. General exam: Sleeping, wakes to voice, no acute distress, obese HEENT: Poor dentition, hearing grossly normal Respiratory system: CTAB, respiratory effort. Cardiovascular system: normal S1/S2, RRR, no pitting edema.  Neurologic: No  gross focal deficits, normal speech, A&O x4 Extremities: moves all, no cyanosis, normal tone Psychiatry: normal mood, flat affect. GU: Foley in place.  Labs   Data Reviewed: I have personally reviewed following labs and imaging studies  CBC: Recent Labs  Lab 08/24/19 1035 08/25/19 0910 08/26/19 0538 08/27/19 0501 08/28/19 0541  WBC 13.4* 11.3* 11.2* 11.1* 11.5*  NEUTROABS 10.8* 9.0* 8.9* 8.7* 9.0*  HGB 10.5* 10.0* 10.5* 10.2* 10.6*  HCT 30.9* 30.3* 30.8* 30.5* 33.1*  MCV 92.2 93.8 93.1 93.6 97.4  PLT 269 277 319 359 277   Basic Metabolic Panel: Recent Labs  Lab 08/24/19 1035 08/25/19 0910 08/26/19 0538 08/27/19 0501 08/28/19 0541  NA 136 136 138 139 138  K 4.1 3.9 3.9 3.9 4.1  CL 107 107 106 107 105  CO2 20* 23 24 23 23   GLUCOSE 135* 110* 145* 113* 129*  BUN 31* 25* 23 24* 26*  CREATININE 0.76 0.65 0.70 0.57 0.55  CALCIUM 7.8* 7.6* 8.1* 7.9* 8.0*  MG 1.6* 1.7  --   --  1.8  PHOS  --   --   --   --  3.4   GFR: Estimated Creatinine Clearance: 64.5 mL/min (by C-G formula based on SCr of 0.55 mg/dL). Liver Function Tests: Recent Labs  Lab 08/24/19 1035 08/27/19 0501 08/28/19 0541  AST 101* 68* 58*  ALT 20 26 12   ALKPHOS 93 72 70  BILITOT 0.8 0.6 0.5  PROT 6.5 6.6 6.8  ALBUMIN 1.8* 2.0* 2.2*    CBG: Recent Labs  Lab 08/28/19 1648 08/28/19 2047 08/29/19 0814 08/29/19 1210 08/29/19 1704  GLUCAP 123* 178* 103* 178* 133*     Recent Results (from the past 240 hour(s))  Blood Culture (routine x 2)     Status: Abnormal   Collection Time: 08/19/19  6:59 PM   Specimen: BLOOD  Result Value Ref Range Status   Specimen Description   Final    BLOOD RIGHT ANTECUBITAL Performed at [facility], [address] [place], [place] [address]    Special Requests   Final    BOTTLES DRAWN AEROBIC AND ANAEROBIC Blood Culture adequate volume Performed at [facility], [address], [place], [place] [address]    Culture  Setup Time   Final    GRAM NEGATIVE RODS IN BOTH AEROBIC AND ANAEROBIC BOTTLES CRITICAL VALUE NOTED.  VALUE IS CONSISTENT WITH PREVIOUSLY REPORTED AND CALLED VALUE. Performed at [facility], [address] [place], [place] [address]    Culture (A)  Final    PROTEUS MIRABILIS SUSCEPTIBILITIES PERFORMED ON PREVIOUS CULTURE WITHIN THE LAST 5 DAYS. Performed at [facility], [place] [address] [place], [place] [address]    Report Status 08/22/2019 FINAL  Final  Blood Culture (routine x 2)     Status: Abnormal   Collection Time: 08/19/19  7:03 PM   Specimen: BLOOD  Result Value Ref Range Status   Specimen Description   Final    BLOOD LEFT ANTECUBITAL Performed at [facility], [address], [place], [place] [address]    Special Requests   Final    BOTTLES DRAWN AEROBIC AND ANAEROBIC Blood Culture adequate volume Performed at [facility], [address], [place],  [address]    Culture  Setup Time   Final    GRAM NEGATIVE RODS IN BOTH AEROBIC AND ANAEROBIC BOTTLES CRITICAL RESULT CALLED TO, READ BACK BY AND VERIFIED WITH: MYRA SLAUGHTER AT 0824 ON 08/20/2019 [place]. Performed at [facility], [place] [address] [place], [place] [address]    Culture PROTEUS MIRABILIS (A)  Final   Report Status  08/22/2019 FINAL  Final   Organism ID, Bacteria PROTEUS MIRABILIS  Final      Susceptibility   Proteus mirabilis - MIC*    AMPICILLIN <=2 SENSITIVE Sensitive     CEFAZOLIN <=4 SENSITIVE Sensitive     CEFEPIME <=0.12 SENSITIVE Sensitive     CEFTAZIDIME <=1 SENSITIVE Sensitive     CEFTRIAXONE <=0.25 SENSITIVE Sensitive     CIPROFLOXACIN 0.5 SENSITIVE Sensitive     GENTAMICIN <=1 SENSITIVE Sensitive     IMIPENEM 2 SENSITIVE Sensitive     TRIMETH/SULFA <=20 SENSITIVE Sensitive     AMPICILLIN/SULBACTAM <=2 SENSITIVE Sensitive     PIP/TAZO <=4 SENSITIVE Sensitive     * PROTEUS MIRABILIS  Blood Culture ID Panel (Reflexed)     Status: Abnormal   Collection Time: 08/19/19  7:03 PM  Result Value Ref Range Status   Enterococcus species NOT DETECTED NOT DETECTED Final   Listeria monocytogenes NOT DETECTED NOT DETECTED Final   Staphylococcus species NOT DETECTED NOT DETECTED Final   Staphylococcus aureus (BCID) NOT DETECTED NOT DETECTED Final   Streptococcus species NOT DETECTED NOT DETECTED Final   Streptococcus agalactiae NOT DETECTED NOT DETECTED Final   Streptococcus pneumoniae NOT DETECTED NOT DETECTED Final   Streptococcus pyogenes NOT DETECTED NOT DETECTED Final   Acinetobacter baumannii NOT DETECTED NOT DETECTED Final   Enterobacteriaceae species DETECTED (A) NOT DETECTED Final    Comment: Enterobacteriaceae represent a large family of gram-negative bacteria, not a single organism. CRITICAL RESULT CALLED TO, READ BACK BY AND VERIFIED WITH: MYRA SLAUGHTER AT 1165 ON 08/20/2019 [place].    Enterobacter cloacae complex NOT DETECTED NOT DETECTED Final   Escherichia coli NOT DETECTED NOT DETECTED Final   Klebsiella oxytoca NOT DETECTED NOT DETECTED Final   Klebsiella pneumoniae NOT DETECTED NOT DETECTED Final   Proteus species DETECTED (A) NOT DETECTED Final    Comment: CRITICAL RESULT CALLED TO, READ BACK BY AND VERIFIED WITH: MYRA SLAUGHTER AT 0824 ON 08/20/2019 [place].    Serratia marcescens  NOT DETECTED NOT DETECTED Final   Carbapenem resistance NOT DETECTED NOT DETECTED Final   Haemophilus influenzae NOT DETECTED NOT DETECTED Final   Neisseria meningitidis NOT DETECTED NOT DETECTED Final   Pseudomonas aeruginosa NOT DETECTED NOT DETECTED Final   Candida albicans NOT DETECTED NOT DETECTED Final   Candida glabrata NOT DETECTED NOT DETECTED Final   Candida krusei NOT DETECTED NOT DETECTED Final   Candida parapsilosis NOT DETECTED NOT DETECTED Final   Candida tropicalis NOT DETECTED NOT DETECTED Final    Comment: Performed at [facility], [address], [place], [place] [address]  Urine culture     Status: Abnormal   Collection Time: 08/19/19  9:15 PM   Specimen: In/Out Cath Urine  Result Value Ref Range Status   Specimen Description   Final    IN/OUT CATH URINE Performed at [facility], [address] [place], [place] [address]    Special Requests   Final    NONE Performed at [facility], [address] [place], [place] [address]    Culture >=100,000 COLONIES/mL PROTEUS MIRABILIS (A)  Final   Report Status 08/22/2019 FINAL  Final   Organism ID, Bacteria PROTEUS MIRABILIS (A)  Final      Susceptibility   Proteus mirabilis - MIC*    AMPICILLIN <=2 SENSITIVE Sensitive     CEFAZOLIN <=  4 SENSITIVE Sensitive     CEFTRIAXONE <=0.25 SENSITIVE Sensitive     CIPROFLOXACIN <=0.25 SENSITIVE Sensitive     GENTAMICIN <=1 SENSITIVE Sensitive     IMIPENEM 2 SENSITIVE Sensitive     NITROFURANTOIN 128 RESISTANT Resistant     TRIMETH/SULFA <=20 SENSITIVE Sensitive     AMPICILLIN/SULBACTAM <=2 SENSITIVE Sensitive     PIP/TAZO <=4 SENSITIVE Sensitive     * >=100,000 COLONIES/mL PROTEUS MIRABILIS  SARS Coronavirus 2 by RT PCR (hospital order, performed in [place] hospital lab) Nasopharyngeal Nasopharyngeal Swab     Status: None   Collection Time: 08/19/19  9:15 PM   Specimen: Nasopharyngeal Swab  Result Value Ref Range Status   SARS Coronavirus  2 NEGATIVE NEGATIVE Final    Comment: (NOTE) SARS-CoV-2 target nucleic acids are NOT DETECTED.  The SARS-CoV-2 RNA is generally detectable in upper and lower respiratory specimens during the acute phase of infection. The lowest concentration of SARS-CoV-2 viral copies this assay can detect is 250 copies / mL. A negative result does not preclude SARS-CoV-2 infection and should not be used as the sole basis for treatment or other patient management decisions.  A negative result may occur with improper specimen collection / handling, submission of specimen other than nasopharyngeal swab, presence of viral mutation(s) within the areas targeted by this assay, and inadequate number of viral copies (<250 copies / mL). A negative result must be combined with clinical observations, patient history, and epidemiological information.  Fact Sheet for Patients:   StrictlyIdeas.no  Fact Sheet for Healthcare Providers: BankingDealers.co.za  This test is not yet approved or  cleared by the [place] FDA and has been authorized for detection and/or diagnosis of SARS-CoV-2 by FDA under an Emergency Use Authorization (EUA).  This EUA will remain in effect (meaning this test can be used) for the duration of the COVID-19 declaration under Section 564(b)(1) of the Act, 21 U.S.C. section 360bbb-3(b)(1), unless the authorization is terminated or revoked sooner.  Performed at [facility], [address] [place], [place] [address]   MRSA PCR Screening     Status: None   Collection Time: 08/20/19  5:55 AM   Specimen: Nasopharyngeal  Result Value Ref Range Status   MRSA by PCR NEGATIVE NEGATIVE Final    Comment:        The GeneXpert MRSA Assay (FDA approved for NASAL specimens only), is one component of a comprehensive MRSA colonization surveillance program. It is not intended to diagnose MRSA infection nor to guide or monitor treatment  for MRSA infections. Performed at [facility], [address] [place], [place] [address]   CULTURE, BLOOD (ROUTINE X 2) w Reflex to ID Panel     Status: None   Collection Time: 08/23/19 10:33 AM   Specimen: BLOOD  Result Value Ref Range Status   Specimen Description BLOOD LAC  Final   Special Requests   Final    BOTTLES DRAWN AEROBIC AND ANAEROBIC Blood Culture adequate volume   Culture   Final    NO GROWTH 5 DAYS Performed at [facility], [address] [place], [place] [address]    Report Status 08/28/2019 FINAL  Final  CULTURE, BLOOD (ROUTINE X 2) w Reflex to ID Panel     Status: None   Collection Time: 08/23/19 10:34 AM   Specimen: BLOOD  Result Value Ref Range Status   Specimen Description BLOOD LFA  Final   Special Requests   Final    BOTTLES DRAWN  AEROBIC AND ANAEROBIC Blood Culture adequate volume   Culture   Final    NO GROWTH 5 DAYS Performed at [facility], [address] [place], [place] [address]    Report Status 08/28/2019 FINAL  Final  Aerobic/Anaerobic Culture (surgical/deep wound)     Status: None (Preliminary result)   Collection Time: 08/25/19 12:52 PM   Specimen: Abscess  Result Value Ref Range Status   Specimen Description ABSCESS  Final   Special Requests post CT guided paraspinal drain  Final   Gram Stain   Final    ABUNDANT WBC PRESENT, PREDOMINANTLY PMN NO ORGANISMS SEEN    Culture   Final    FEW PROTEUS MIRABILIS NO ANAEROBES ISOLATED; CULTURE IN PROGRESS FOR 5 DAYS    Report Status PENDING  Incomplete   Organism ID, Bacteria PROTEUS MIRABILIS  Final      Susceptibility   Proteus mirabilis - MIC*    AMPICILLIN <=2 SENSITIVE Sensitive     CEFAZOLIN <=4 SENSITIVE Sensitive     CEFEPIME <=0.12 SENSITIVE Sensitive     CEFTAZIDIME <=1 SENSITIVE Sensitive     CEFTRIAXONE <=0.25 SENSITIVE Sensitive     CIPROFLOXACIN <=0.25 SENSITIVE Sensitive     GENTAMICIN <=1 SENSITIVE Sensitive     IMIPENEM 2 SENSITIVE  Sensitive     TRIMETH/SULFA <=20 SENSITIVE Sensitive     AMPICILLIN/SULBACTAM <=2 SENSITIVE Sensitive     PIP/TAZO Value in next row Sensitive      <=4 SENSITIVEPerformed at [facility] [address], [place], [place] [address]    * FEW PROTEUS MIRABILIS  Culture, blood (Routine X 2) w Reflex to ID Panel     Status: None (Preliminary result)   Collection Time: 08/27/19  3:21 PM   Specimen: BLOOD  Result Value Ref Range Status   Specimen Description BLOOD BLOOD LEFT HAND  Final   Special Requests   Final    BOTTLES DRAWN AEROBIC ONLY Blood Culture results may not be optimal due to an inadequate volume of blood received in culture bottles   Culture   Final    NO GROWTH 2 DAYS Performed at [facility], [address], [place], [place] [address]    Report Status PENDING  Incomplete  Culture, blood (Routine X 2) w Reflex to ID Panel     Status: None (Preliminary result)   Collection Time: 08/27/19  3:21 PM   Specimen: BLOOD  Result Value Ref Range Status   Specimen Description BLOOD LEFT ANTECUBITAL  Final   Special Requests   Final    BOTTLES DRAWN AEROBIC ONLY Blood Culture adequate volume   Culture   Final    NO GROWTH 2 DAYS Performed at [facility], [address], [place], [place] [address]    Report Status PENDING  Incomplete      Imaging Studies   MR Lumbar Spine W Wo Contrast  Result Date: 08/28/2019 CLINICAL DATA:  Lumbar discitis. Percutaneous drainage of left iliopsoas abscess. EXAM: MRI LUMBAR SPINE WITHOUT AND WITH CONTRAST TECHNIQUE: Multiplanar and multiecho pulse sequences of the lumbar spine were obtained without and with intravenous contrast. CONTRAST:  77m GADAVIST GADOBUTROL 1 MMOL/ML IV SOLN COMPARISON:  MRI lumbar spine without contrast 08/24/2019 FINDINGS: Segmentation:  Normal Alignment:  Lumbar levoscoliosis. Mild retrolisthesis L1-2, L2-3. 12 mm anterolisthesis L5-S1 is unchanged. Vertebrae: Superior endplate fracture of TK93 with mild edema. No significant enhancement. Probable healing fracture. Adjacent large hemangioma T12 vertebral body on the left. Small hemangioma  L1 vertebral body. Abnormal disc space at L3-4 with inferior endplate depression of L3 unchanged. Moderate depression of the superior endplate of L4 has progressed in the interval. There is edema in the disc without significant disc fluid or enhancement. There is bone marrow edema surrounding the L3-4 disc space. There is a large right facet joint effusion with enhancement of the facet joint and surrounding soft tissue suggestive of infection. Right facet joint effusion has progressed in the interval. Conus medullaris and cauda equina: Conus extends to the L2-3 level. Conus and cauda equina appear normal. Paraspinal and other soft tissues: Bilateral psoas edema most consistent with myositis. Right psoas fluid collection has resolved following percutaneous drainage. Small loculated fluid collection in the left psoas muscle has progressed in the interval likely abscess. Posterior epidural fluid collection at the L3-4 level is likely an abscess and unchanged. Disc levels: T12-L1: Negative for stenosis L1-2: Asymmetric disc degeneration on the right with spurring and mild subarticular stenosis on the right L2-3: Advanced disc degeneration asymmetric on the right with disc space narrowing and spurring. Moderate to advanced facet degeneration bilaterally. Moderate to severe subarticular and foraminal stenosis on the right. Mild to moderate subarticular stenosis on the left and mild spinal stenosis. L3-4: Disc degeneration with disc bulging and central disc protrusion unchanged. There is no discal enhancement or discal fluid which typically is seen with disc infection. However there is considerable surrounding soft tissue swelling and bilateral psoas abscess compatible with infection. There is progressive right facet joint effusion with enhancement of the facet joint and  surrounding soft tissue suggestive of infection. There is a posterior epidural fluid collection on the right measuring approximately 6 x 15 mm which is unchanged. Probable abscess. Mild spinal stenosis has improved in the interval. L4-5: Mild facet degeneration bilaterally.  No significant stenosis L5-S1: Chronic bilateral pars defects of L5 with 12 mm anterolisthesis. There is compression of the left L5 nerve root in the foramen unchanged from the prior study. Right foramen patent. IMPRESSION: 1. Fracture of the inferior endplate of L3 is unchanged. Fracture of the superior endplate of L4 shows mild interval progression. 2. 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 Progression of left psoas abscess and improvement in right psoas abscess since prior drainage. There is a posterior epidural fluid collection on the right at L3-4 compatible with abscess. This is likely rising from infected right facet joint. In addition, there is probable infection in the L3-4 disc space. 3. Scoliosis and degenerative changes throughout the lumbar spine as above. Electronically Signed   By: Franchot Gallo M.D.   On: 08/28/2019 15:00   ECHOCARDIOGRAM COMPLETE  Result Date: 08/28/2019    ECHOCARDIOGRAM REPORT   Patient Name:   BERLYNN WARSAME [facility] Date of Exam: 08/27/2019 Medical Rec #:  [id number]      Height:       64.0 in Accession #:    [id number]     Weight:       160.1 lb Date of Birth:  10-Mar-1950     BSA:          1.780 m Patient Age:    72 years       BP:           113/59 mmHg Patient Gender: F              HR:           81 bpm. Exam Location:  [facility] Procedure:  2D Echo, Cardiac Doppler and Color Doppler Indications:     Sepsis  History:         Patient has no prior history of Echocardiogram examinations.                  Risk Factors:Hypertension, Diabetes and Dyslipidemia. Anxiety.                  Thyroid disease. Sleep apnea. Parkinson's disease.  Sonographer:     Wilford Sports  Rodgers-Jones Referring Phys:  [place] Diagnosing Phys: Bartholome Bill MD IMPRESSIONS  1. Left ventricular ejection fraction, by estimation, is 60 to 65%. The left ventricle has normal function. The left ventricle has no regional wall motion abnormalities. Left ventricular diastolic parameters were normal.  2. Right ventricular systolic function is normal. The right ventricular size is normal. There is normal pulmonary artery systolic pressure.  3. The mitral valve is grossly normal. No evidence of mitral valve regurgitation.  4. The aortic valve is grossly normal. Aortic valve regurgitation is not visualized. FINDINGS  Left Ventricle: Left ventricular ejection fraction, by estimation, is 60 to 65%. The left ventricle has normal function. The left ventricle has no regional wall motion abnormalities. The left ventricular internal cavity size was normal in size. There is  no left ventricular hypertrophy. Left ventricular diastolic parameters were normal. Right Ventricle: The right ventricular size is normal. No increase in right ventricular wall thickness. Right ventricular systolic function is normal. There is normal pulmonary artery systolic pressure. The tricuspid regurgitant velocity is 2.54 m/s, and  with an assumed right atrial pressure of 10 mmHg, the estimated right ventricular systolic pressure is 38.1 mmHg. Left Atrium: Left atrial size was normal in size. Right Atrium: Right atrial size was normal in size. Pericardium: There is no evidence of pericardial effusion. Mitral Valve: The mitral valve is grossly normal. No evidence of mitral valve regurgitation. Tricuspid Valve: The tricuspid valve is not well visualized. Tricuspid valve regurgitation is mild. Aortic Valve: The aortic valve is grossly normal. Aortic valve regurgitation is not visualized. Pulmonic Valve: The pulmonic valve was not well visualized. Pulmonic valve regurgitation is not visualized. Aorta: The aortic root is normal in size and  structure. IAS/Shunts: The interatrial septum was not assessed.  LEFT VENTRICLE PLAX 2D LVIDd:         3.63 cm  Diastology LVIDs:         2.26 cm  LV e' lateral:   11.10 cm/s LV PW:         1.08 cm  LV E/e' lateral: 5.9 LV IVS:        1.02 cm  LV e' medial:    7.96 cm/s LVOT diam:     2.10 cm  LV E/e' medial:  8.2 LV SV:         88 LV SV Index:   49 LVOT Area:     3.46 cm  RIGHT VENTRICLE RV Basal diam:  2.98 cm RV S prime:     16.70 cm/s TAPSE (M-mode): 2.1 cm LEFT ATRIUM             Index       RIGHT ATRIUM          Index LA diam:        4.40 cm 2.47 cm/m  RA Area:     9.99 cm LA Vol (A2C):   41.9 ml 23.54 ml/m RA Volume:   22.40 ml 12.59 ml/m LA Vol (A4C):  24.3 ml 13.65 ml/m LA Biplane Vol: 32.1 ml 18.04 ml/m  AORTIC VALVE LVOT Vmax:   127.00 cm/s LVOT Vmean:  95.700 cm/s LVOT VTI:    0.253 m  AORTA Ao Root diam: 3.60 cm MITRAL VALVE               TRICUSPID VALVE MV Area (PHT): 2.26 cm    TR Peak grad:   25.8 mmHg MV Decel Time: 335 msec    TR Vmax:        254.00 cm/s MV E velocity: 65.60 cm/s MV A velocity: 97.90 cm/s  SHUNTS MV E/A ratio:  0.67        Systemic VTI:  0.25 m                            Systemic Diam: 2.10 cm Bartholome Bill MD Electronically signed by Bartholome Bill MD Signature Date/Time: 08/28/2019/8:38:16 AM    Final      Medications   Scheduled Meds: . ALPRAZolam  0.25 mg Oral q morning - 10a  . calcium-vitamin D  1 tablet Oral Daily  . carbidopa-levodopa  1 tablet Oral BID  . Chlorhexidine Gluconate Cloth  6 each Topical Q0600  . citalopram  40 mg Oral Daily  . docusate sodium  100 mg Oral BID  . enoxaparin (LOVENOX) injection  40 mg Subcutaneous Q24H  . feeding supplement (ENSURE ENLIVE)  237 mL Oral TID BM  . insulin aspart  0-15 Units Subcutaneous TID WC  . insulin aspart  0-5 Units Subcutaneous QHS  . levothyroxine  175 mcg Oral Q0600  . megestrol  40 mg Oral Daily  . mirtazapine  7.5 mg Oral QHS  . multivitamin with minerals  1 tablet Oral Daily  . pantoprazole   40 mg Oral Daily  . rOPINIRole  0.5 mg Oral BID  . rOPINIRole  1 mg Oral QHS  . senna  1 tablet Oral Daily  . sertraline  50 mg Oral Daily  . simvastatin  40 mg Oral QHS  . sodium chloride flush  10-40 mL Intracatheter Q12H  . sodium chloride flush  5 mL Intracatheter Q8H  . tamsulosin  0.4 mg Oral QPC supper   Continuous Infusions: . sodium chloride 75 mL/hr at 08/29/19 1557  . sodium chloride 75 mL/hr at 08/29/19 0846  . cefTRIAXone (ROCEPHIN)  IV Stopped (08/28/19 2155)     LOS: 10 days   Brandy Hale [facility] [phone] 08/29/2019, 6:33 PM    [If 7PM-7AM, please contact night-coverage. How to contact the [facility] Attending or Consulting provider [place] or covering provider during after hours [place], for this patient?    1. Check the care team in [facility] and look for a) attending/consulting [facility] provider listed and b) the [facility] team listed 2. Log into [email or web address] and use [place]'s universal password to access. If you do not have the password, please contact the hospital operator. 3. Locate the [facility] provider you are looking for under [facility] and page to a number that you can be directly reached. 4. If you still have difficulty reaching the provider, please page the [facility] (Director on Call) for the Hospitalists listed on amion for assistance.]

## 2019-08-29 NOTE — Plan of Care (Signed)
  Problem: Elimination: Goal: Will not experience complications related to urinary retention Outcome: Progressing   Problem: Pain Managment: Goal: General experience of comfort will improve Outcome: Progressing Note: Pt receiving her pain meds

## 2019-08-30 LAB — GLUCOSE, CAPILLARY
Glucose-Capillary: 102 mg/dL — ABNORMAL HIGH (ref 70–99)
Glucose-Capillary: 135 mg/dL — ABNORMAL HIGH (ref 70–99)
Glucose-Capillary: 145 mg/dL — ABNORMAL HIGH (ref 70–99)
Glucose-Capillary: 109 mg/dL — ABNORMAL HIGH (ref 70–99)

## 2019-08-30 LAB — AEROBIC/ANAEROBIC CULTURE W GRAM STAIN (SURGICAL/DEEP WOUND)

## 2019-08-30 MED ORDER — LACTULOSE 10 GM/15ML PO SOLN
10.0000 g | Freq: Once | ORAL | Status: AC
  Administered 2019-08-30: 10 g via ORAL
  Filled 2019-08-30: qty 30

## 2019-08-30 NOTE — Progress Notes (Signed)
PROGRESS NOTE    CORYNN SOLBERG   XYB:[id number]  DOB: 1951-01-13  PCP: Ronnell Freshwater, NP    DOA: 08/19/2019 LOS: 11   Brief Narrative   Theresa Mills is a 69 y.o. female with medical history of DM, GERD, anxiety, OSA, Parkinson's with dementia, hypothyroidism, frequent falls, brought in 08/19/19 by EMS after SNF staff called due to shortness of breath and altered mental status.  Patient complained of abdominal pain and neck pain but could not provide additional history due to dementia and altered mental status.  In the ED, patient was lethargic, afebrile, hypotensive with BP 83/41, HR 90, RR 24, O2 sat 93% on room air.  Labs were notable for leukocytosis 18k, lactic acidosis 2.5.  UA showed pyuria.  Creatinine was 2.43 (baseline 0.69), procalcitonin 4.62, troponin 28.  CT abdomen pelvis showed distended urinary bladder with bilateral hydronephrosis and findings concerning for pyelonephritis, several nonobstructing left renal calculi.  No focal consolidation was seen on CT chest.  EKG was normal sinus rhythm.  Patient's blood pressure improved with IV fluid bolus.  Started on IV antibiotics and admitted to hospitalist service for further evaluation and management.   Pt today is alert and oriented and denies any complaints. Vitals are stable pt is afebrile.   Assessment & Plan   Principal Problem:   Osteomyelitis of lumbar vertebra (HCC) Active Problems:   Dementia due to Parkinson's disease without behavioral disturbance (HCC)   Diabetes mellitus type 2, uncomplicated (HCC)   Essential hypertension   Pressure injury of coccygeal region, stage 2 (HCC)   Acquired hypothyroidism   Severe sepsis (HCC)   Bilateral hydronephrosis   Acute pyelonephritis   Acute metabolic encephalopathy   Acute urinary retention   AKI (acute kidney injury) ([place])   Malnutrition of moderate degree   Lumbar compression fracture (HCC)   Lumbar discitis   Lumbar Osteomyelitis / Discitis / Epidural  Abscess / Right psoas abscess - obtained MRI lumbar spine due to ongoing urinary retention, no sign of cauda equina syndrome, but appears to have extensive infection involving levels L3-L4.  ID consulted.   IR did CT-guided aspiration of the psoas abscess, cultures growing Proteus. Will need 6 weeks IV Rocephin 2 g every 24 hours.  Monitor ESR/CRP. --Flush percutaneous drain with 10 cc sterile saline daily. --Continue Rocephin --Place order for PICC line --Pt s/p PICC line placed today in RA for extended duration of iv abx.  --neurosurgery consulted / iv abx continued. -- will sch pt for left psoas drain by CT IR. -- After d/w IR another drain was not required.  --d/w ID and Dr.ravishankar with ? If IR would drain the Epidural abscess.   Severe sepsis secondary to acute pyelonephritis and bacteremia - present on admission with leukocytosis, tachycardia, hypotension that was fluid responsive, lactic acidosis and AKI.  Imaging findings and UA are consistent with pyelonephritis.   Sepsis physiology has improved and lactic acidosis resolved.  Blood cultures grew Proteus and Enterobacteriaceae spp.  Abscess cultures as above, now on long course Rocephin.   Continue IV antibiotic regimen for ongoing bacteremia and positive blood cultures.  Bilateral hydronephrosis secondary to acute urinary retention -present on admission, CT abdomen showed distended bladder and bilateral hydronephrosis.  Foley catheter removed this AM for voiding trial.  Foley replaced 8/1 (almost 800 cc on bladder scan).  Flomax started 7/29.  MRI lumbar ruled out cauda equina syndrome.   Urology consulted.  Recommend keeping the Foley catheter in place and voiding  trial as outpatient in about 2 weeks.  Start patient on IV fluids patient still has persistent pyuria from hydronephrosis.  Acute kidney injury - Resolved with Foley and IV fluids.  Present on admission with creatinine 2.43, prior baseline 0.69.  Likely multifactorial with  sepsis and hypotension causing prerenal azotemia and post renal secondary to hydronephrosis and acute urinary retention.   Improved with Foley and IVF's.  Foley d/c'd for voiding trial, but replaced on 8/1 due to persistent retention.  Daily BMP to monitor.  Avoid nephrotoxins and hypotension.    Lumbar compression fractures- present on admission, seen on CT abdomen/pelvis in the ED, showed compression fractures of the superior endplate of L4 and inferior endplate of L3, likely acute.  Patient has not complained of back pain throughout admission.  MRI obtained, showed infection as above.  No neurosurgery consult for now given infection at that level.    Acute metabolic encephalopathy - Present on admission likely secondary to infection on top of baseline dementia.  Resolved with treatment of infection.    Transaminitis -present on admission, improving.  AST 271>>103, ALT 24>>64, alk phos 137>>94, normal total bili 1.2>>1.0.  RUQ showed liver echogenicity indicative of fatty infiltration, no focal liver lesions, gallbladder absent..  Suspect due to severe sepsis and shock liver.  AST ALT pending today.  Type 2 diabetes -sliding scale NovoLog, A1c 5.6  Dementia due to Parkinson's disease without behavioral disturbance -no acute issues.  Continue Sinemet, ropinirole  Essential hypertension -not on antihypertensives outpatient  Acquired hypothyroidism -continue levothyroxine  Hyperlipidemia -continue simvastatin  Depression/anxiety -continue Zoloft, mirtazapine, Celexa, and Xanax  Insomnia -continue melatonin  Pressure Injury -   Pressure Injury 08/20/19 Coccyx Medial Stage 2 -  Partial thickness loss of dermis presenting as a shallow open injury with a red, pink wound bed without slough. (Active)  08/20/19 0500  Location: Coccyx  Location Orientation: Medial  Staging: Stage 2 -  Partial thickness loss of dermis presenting as a shallow open injury with a red, pink wound bed without slough.    Wound Description (Comments):   Present on Admission: Yes   Patient BMI: Body mass index is 28.87 kg/m.   DVT prophylaxis:  Lovenox.  Diet:  Diet Orders (From admission, onward)    Start     Ordered   08/29/19 0943  Diet Carb Modified Fluid consistency: Thin; Room service appropriate? Yes  Diet effective now       Question Answer Comment  Diet-HS Snack? Nothing   Calorie Level Medium 1600-2000   Fluid consistency: Thin   Room service appropriate? Yes      08/29/19 0943            Code Status: Full Code    Subjective 08/30/19    Patient seen at bedside this morning.  No acute events reported overnight.  Says she is feeling better today.  Just continues to be very tired and weak feeling.  Denies fevers or chills, chest pain or shortness of breath, nausea vomiting diarrhea or other acute complaints.    08/28/19: Repeat Mri shows: IMPRESSION: 1. Fracture of the inferior endplate of L3 is unchanged. Fracture of the superior endplate of L4 shows mild interval progression. 2. 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 Progression of left psoas abscess and improvement in right psoas abscess since prior drainage. There is a posterior epidural fluid collection on the right at L3-4 compatible with abscess. This is likely rising from  infected right facet joint. In addition, there is probable infection in the L3-4 disc space. 3. Scoliosis and degenerative changes throughout the lumbar spine as Above. Pt is alert and awake and pain is better. Requested neurosurgery to evaluate pt  As well due to discitis. Echo reports as below: 1. Left ventricular ejection fraction, by estimation, is 60 to 65%. The left ventricle has normal function. The left ventricle has no regional wall motion abnormalities. Left ventricular diastolic parameters were normal. 2. Right ventricular systolic function is normal. The right ventricular size is  normal. There is normal pulmonary artery systolic pressure. 3. The mitral valve is grossly normal. No evidence of mitral valve regurgitation. 4. The aortic valve is grossly normal. Aortic valve regurgitation is not visualized.  08/29/19 Pt is alert and awake and d/w her about possible left psoas drain which was not needed after d/w IR Dr.Schick.D/W sister gail about care so far and pt being ill and being stable.  Case also d/w Neurosurgery and ID and no change in current plan pt needs 6 weeks of IV abx.Pt reports pain in left leg and back .  08/30/19 Pt is doing better and pain is better controlled. We will get pt on board now.  Drain has output of 20 so far.    Disposition Plan & Communication   Status is: Inpatient  Remains inpatient appropriate because:IV treatments appropriate due to intensity of illness or inability to take PO.  Continues to have acute urinary retention after voiding trial.  Urology consult pending.  Spinal infection with cultures pending.  Dispo: The patient is from: SNF [facility])              Anticipated d/c is to: SNF [facility])              Anticipated d/c date is: 1-2 days              Patient currently is not medically stable to d/c.   Family Communication:  A/P: Spoke with friend, Vaughan Basta, by phone this afternoon at patient's request.  She and patient's sister do not want her to return to [facility], feel she has not been well cared for there.  Has been to [facility] in the past, and had positive experience there.  Vaughan Basta says patient was ambulatory prior to her admission that led to d/c to rehab at [facility].  Consults, Procedures, Significant Events   Consultants:  Infectious disease. Vascular access team. Case management team.  Procedures:   Foley placed in the ER 7/27, discontinued 7/30 for void trial  Foley replaced 8/1  8/2 CT guided drain catheter placed in right psoas abscess, aspirated for culture  Antimicrobials:    Rocephin 7/27 >> 7/30  Ampicillin 7/30 >>8/3  Rocephin 8/3 >>Active.  Objective   Vitals:   08/30/19 0525 08/30/19 0752 08/30/19 1122 08/30/19 1629  BP: 129/81 121/68 130/66 123/66  Pulse: 79 69 71 73  Resp:  18 18 19   Temp: 98.2 F (36.8 C) 99.5 F (37.5 C) 98.3 F (36.8 C) 99 F (37.2 C)  TempSrc: Oral Oral Oral Oral  SpO2: 96% 96% 97% 97%  Weight: 76.3 kg     Height:        Intake/Output Summary (Last 24 hours) at 08/30/2019 1700 Last data filed at 08/30/2019 1540 Gross per 24 hour  Intake 2623.82 ml  Output 2345 ml  Net 278.82 ml   Filed Weights   08/27/19 0448 08/28/19 0508 08/30/19 0525  Weight: 72.6  kg 74 kg 76.3 kg    Physical Exam: Blood pressure 123/66, pulse 73, temperature 99 F (37.2 C), temperature source Oral, resp. rate 19, height 5' 4"  (1.626 m), weight 76.3 kg, SpO2 97 %. General exam: Sleeping, wakes to voice, no acute distress, obese HEENT: Poor dentition, hearing grossly normal Respiratory system: CTAB, respiratory effort. Cardiovascular system: normal S1/S2, RRR, no pitting edema.  Neurologic: No gross focal deficits, normal speech, A&O x4 Extremities: moves all, no cyanosis, normal tone Psychiatry: normal mood, flat affect. GU: Foley in place.  Labs   Data Reviewed: I have personally reviewed following labs and imaging studies  CBC: Recent Labs  Lab 08/24/19 1035 08/25/19 0910 08/26/19 0538 08/27/19 0501 08/28/19 0541  WBC 13.4* 11.3* 11.2* 11.1* 11.5*  NEUTROABS 10.8* 9.0* 8.9* 8.7* 9.0*  HGB 10.5* 10.0* 10.5* 10.2* 10.6*  HCT 30.9* 30.3* 30.8* 30.5* 33.1*  MCV 92.2 93.8 93.1 93.6 97.4  PLT 269 277 319 359 163   Basic Metabolic Panel: Recent Labs  Lab 08/24/19 1035 08/25/19 0910 08/26/19 0538 08/27/19 0501 08/28/19 0541  NA 136 136 138 139 138  K 4.1 3.9 3.9 3.9 4.1  CL 107 107 106 107 105  CO2 20* 23 24 23 23   GLUCOSE 135* 110* 145* 113* 129*  BUN 31* 25* 23 24* 26*  CREATININE 0.76 0.65 0.70 0.57 0.55  CALCIUM  7.8* 7.6* 8.1* 7.9* 8.0*  MG 1.6* 1.7  --   --  1.8  PHOS  --   --   --   --  3.4   GFR: Estimated Creatinine Clearance: 65.4 mL/min (by C-G formula based on SCr of 0.55 mg/dL). Liver Function Tests: Recent Labs  Lab 08/24/19 1035 08/27/19 0501 08/28/19 0541  AST 101* 68* 58*  ALT 20 26 12   ALKPHOS 93 72 70  BILITOT 0.8 0.6 0.5  PROT 6.5 6.6 6.8  ALBUMIN 1.8* 2.0* 2.2*   CBG: Recent Labs  Lab 08/29/19 1210 08/29/19 1704 08/29/19 2058 08/30/19 0817 08/30/19 1123  GLUCAP 178* 133* 117* 102* 135*     Recent Results (from the past 240 hour(s))  CULTURE, BLOOD (ROUTINE X 2) w Reflex to ID Panel     Status: None   Collection Time: 08/23/19 10:33 AM   Specimen: BLOOD  Result Value Ref Range Status   Specimen Description BLOOD LAC  Final   Special Requests   Final    BOTTLES DRAWN AEROBIC AND ANAEROBIC Blood Culture adequate volume   Culture   Final    NO GROWTH 5 DAYS Performed at [facility], [address] [place], [place] [address]    Report Status 08/28/2019 FINAL  Final  CULTURE, BLOOD (ROUTINE X 2) w Reflex to ID Panel     Status: None   Collection Time: 08/23/19 10:34 AM   Specimen: BLOOD  Result Value Ref Range Status   Specimen Description BLOOD LFA  Final   Special Requests   Final    BOTTLES DRAWN AEROBIC AND ANAEROBIC Blood Culture adequate volume   Culture   Final    NO GROWTH 5 DAYS Performed at [facility], [address], [place], [place] [address]    Report Status 08/28/2019 FINAL  Final  Aerobic/Anaerobic Culture (surgical/deep wound)     Status: None   Collection Time: 08/25/19 12:52 PM   Specimen: Abscess  Result Value Ref Range Status   Specimen Description ABSCESS  Final   Special Requests post CT guided paraspinal drain  Final  Gram Stain   Final    ABUNDANT WBC PRESENT, PREDOMINANTLY PMN NO ORGANISMS SEEN    Culture   Final    FEW PROTEUS MIRABILIS NO ANAEROBES ISOLATED Performed at [facility],  [place] [address] [place], [place] [address]    Report Status 08/30/2019 FINAL  Final   Organism ID, Bacteria PROTEUS MIRABILIS  Final      Susceptibility   Proteus mirabilis - MIC*    AMPICILLIN <=2 SENSITIVE Sensitive     CEFAZOLIN <=4 SENSITIVE Sensitive     CEFEPIME <=0.12 SENSITIVE Sensitive     CEFTAZIDIME <=1 SENSITIVE Sensitive     CEFTRIAXONE <=0.25 SENSITIVE Sensitive     CIPROFLOXACIN <=0.25 SENSITIVE Sensitive     GENTAMICIN <=1 SENSITIVE Sensitive     IMIPENEM 2 SENSITIVE Sensitive     TRIMETH/SULFA <=20 SENSITIVE Sensitive     AMPICILLIN/SULBACTAM <=2 SENSITIVE Sensitive     PIP/TAZO <=4 SENSITIVE Sensitive     * FEW PROTEUS MIRABILIS  Culture, blood (Routine X 2) w Reflex to ID Panel     Status: None (Preliminary result)   Collection Time: 08/27/19  3:21 PM   Specimen: BLOOD  Result Value Ref Range Status   Specimen Description BLOOD BLOOD LEFT HAND  Final   Special Requests   Final    BOTTLES DRAWN AEROBIC ONLY Blood Culture results may not be optimal due to an inadequate volume of blood received in culture bottles   Culture   Final    NO GROWTH 3 DAYS Performed at [facility], [address] [place], [place] [address]    Report Status PENDING  Incomplete  Culture, blood (Routine X 2) w Reflex to ID Panel     Status: None (Preliminary result)   Collection Time: 08/27/19  3:21 PM   Specimen: BLOOD  Result Value Ref Range Status   Specimen Description BLOOD LEFT ANTECUBITAL  Final   Special Requests   Final    BOTTLES DRAWN AEROBIC ONLY Blood Culture adequate volume   Culture   Final    NO GROWTH 3 DAYS Performed at [facility], [address] [place], [place] [address]    Report Status PENDING  Incomplete      Imaging Studies   No results found.   Medications   Scheduled Meds: . ALPRAZolam  0.25 mg Oral q morning - 10a  . calcium-vitamin D  1 tablet Oral Daily  . carbidopa-levodopa  1 tablet Oral BID  . Chlorhexidine Gluconate  Cloth  6 each Topical Q0600  . citalopram  40 mg Oral Daily  . docusate sodium  100 mg Oral BID  . enoxaparin (LOVENOX) injection  40 mg Subcutaneous Q24H  . feeding supplement (ENSURE ENLIVE)  237 mL Oral TID BM  . insulin aspart  0-15 Units Subcutaneous TID WC  . insulin aspart  0-5 Units Subcutaneous QHS  . levothyroxine  175 mcg Oral Q0600  . megestrol  40 mg Oral Daily  . mirtazapine  7.5 mg Oral QHS  . multivitamin with minerals  1 tablet Oral Daily  . pantoprazole  40 mg Oral Daily  . rOPINIRole  0.5 mg Oral BID  . rOPINIRole  1 mg Oral QHS  . senna  1 tablet Oral Daily  . sertraline  50 mg Oral Daily  . simvastatin  40 mg Oral QHS  . sodium chloride flush  10-40 mL Intracatheter Q12H  . sodium chloride flush  5 mL Intracatheter Q8H  . tamsulosin  0.4 mg Oral QPC supper   Continuous Infusions: . sodium chloride Stopped (08/29/19 2109)  . sodium chloride 75 mL/hr at 08/30/19 1504  . cefTRIAXone (ROCEPHIN)  IV Stopped (08/29/19 2156)     LOS: 11 days   Gretta Cool Subrina Vecchiarelli,MD [facility] [phone] 08/30/2019, 5:00 PM    [If 7PM-7AM, please contact night-coverage. How to contact the [facility] Attending or Consulting provider [place] or covering provider during after hours [place], for this patient?    1. Check the care team in [facility] and look for a) attending/consulting [facility] provider listed and b) the [facility] team listed 2. Log into [email or web address] and use [place]'s universal password to access. If you do not have the password, please contact the hospital operator. 3. Locate the [facility] provider you are looking for under [facility] and page to a number that you can be directly reached. 4. If you still have difficulty reaching the provider, please page the [facility] (Director on Call) for the Hospitalists listed on amion for assistance.]

## 2019-08-30 NOTE — Plan of Care (Signed)
  Problem: Clinical Measurements: Goal: Ability to maintain clinical measurements within normal limits will improve Outcome: Progressing Note: Pt getting IV abx   Problem: Skin Integrity: Goal: Risk for impaired skin integrity will decrease Outcome: Progressing

## 2019-08-31 LAB — CBC WITH DIFFERENTIAL/PLATELET
Abs Immature Granulocytes: 0.07 10*3/uL (ref 0.00–0.07)
Basophils Absolute: 0 10*3/uL (ref 0.0–0.1)
Basophils Relative: 0 %
Eosinophils Absolute: 0.1 10*3/uL (ref 0.0–0.5)
Eosinophils Relative: 1 %
HCT: 30.1 % — ABNORMAL LOW (ref 36.0–46.0)
Hemoglobin: 9.9 g/dL — ABNORMAL LOW (ref 12.0–15.0)
Immature Granulocytes: 1 %
Lymphocytes Relative: 14 %
Lymphs Abs: 1.3 10*3/uL (ref 0.7–4.0)
MCH: 31.6 pg (ref 26.0–34.0)
MCHC: 32.9 g/dL (ref 30.0–36.0)
MCV: 96.2 fL (ref 80.0–100.0)
Monocytes Absolute: 0.4 10*3/uL (ref 0.1–1.0)
Monocytes Relative: 4 %
Neutro Abs: 7.6 10*3/uL (ref 1.7–7.7)
Neutrophils Relative %: 80 %
Platelets: 327 10*3/uL (ref 150–400)
RBC: 3.13 MIL/uL — ABNORMAL LOW (ref 3.87–5.11)
RDW: 14.8 % (ref 11.5–15.5)
WBC: 9.5 10*3/uL (ref 4.0–10.5)
nRBC: 0 % (ref 0.0–0.2)

## 2019-08-31 LAB — GLUCOSE, CAPILLARY
Glucose-Capillary: 167 mg/dL — ABNORMAL HIGH (ref 70–99)
Glucose-Capillary: 138 mg/dL — ABNORMAL HIGH (ref 70–99)
Glucose-Capillary: 158 mg/dL — ABNORMAL HIGH (ref 70–99)
Glucose-Capillary: 103 mg/dL — ABNORMAL HIGH (ref 70–99)

## 2019-08-31 LAB — T4, FREE: Free T4: 0.9 ng/dL (ref 0.61–1.12)

## 2019-08-31 LAB — TSH: TSH: 13.319 u[IU]/mL — ABNORMAL HIGH (ref 0.350–4.500)

## 2019-08-31 LAB — VITAMIN B12: Vitamin B-12: 688 pg/mL (ref 180–914)

## 2019-08-31 MED ORDER — LEVOTHYROXINE SODIUM 88 MCG PO TABS
188.0000 ug | ORAL_TABLET | Freq: Every day | ORAL | Status: DC
  Administered 2019-09-01 – 2019-09-03 (×3): 188 ug via ORAL
  Filled 2019-08-31 (×4): qty 1

## 2019-08-31 NOTE — Progress Notes (Signed)
PROGRESS NOTE    Theresa Mills   CZY:[id number]  DOB: October 09, 1950  PCP: Ronnell Freshwater, NP    DOA: 08/19/2019 LOS: 12   Brief Narrative   Theresa Mills is a 69 y.o. female with medical history of DM, GERD, anxiety, OSA, Parkinson's with dementia, hypothyroidism, frequent falls, brought in 08/19/19 by EMS after SNF staff called due to shortness of breath and altered mental status.  Patient complained of abdominal pain and neck pain but could not provide additional history due to dementia and altered mental status.  In the ED, patient was lethargic, afebrile, hypotensive with BP 83/41, HR 90, RR 24, O2 sat 93% on room air.  Labs were notable for leukocytosis 18k, lactic acidosis 2.5.  UA showed pyuria.  Creatinine was 2.43 (baseline 0.69), procalcitonin 4.62, troponin 28.  CT abdomen pelvis showed distended urinary bladder with bilateral hydronephrosis and findings concerning for pyelonephritis, several nonobstructing left renal calculi.  No focal consolidation was seen on CT chest.  EKG was normal sinus rhythm.  Patient's blood pressure improved with IV fluid bolus.  Started on IV antibiotics and admitted to hospitalist service for further evaluation and management.   Pt today is alert and oriented and denies any complaints. Vitals are stable pt is afebrile.   Assessment & Plan   Principal Problem:   Osteomyelitis of lumbar vertebra (HCC) Active Problems:   Dementia due to Parkinson's disease without behavioral disturbance (HCC)   Diabetes mellitus type 2, uncomplicated (HCC)   Essential hypertension   Pressure injury of coccygeal region, stage 2 (HCC)   Acquired hypothyroidism   Severe sepsis (HCC)   Bilateral hydronephrosis   Acute pyelonephritis   Acute metabolic encephalopathy   Acute urinary retention   AKI (acute kidney injury) ([place])   Malnutrition of moderate degree   Lumbar compression fracture (HCC)   Lumbar discitis   Lumbar Osteomyelitis / Discitis / Epidural  Abscess / Right psoas abscess - obtained MRI lumbar spine due to ongoing urinary retention, no sign of cauda equina syndrome, but appears to have extensive infection involving levels L3-L4.  ID consulted.   IR did CT-guided aspiration of the psoas abscess, cultures growing Proteus. Will need 6 weeks IV Rocephin 2 g every 24 hours.  Monitor ESR/CRP. --Flush percutaneous drain with 10 cc sterile saline daily. --Continue Rocephin --Place order for PICC line --Pt s/p PICC line placed today in RA for extended duration of iv abx.  --neurosurgery consulted / iv abx continued. -- will sch pt for left psoas drain by CT IR. -- After d/w IR another drain was not required.  --d/w ID and Dr.ravishankar with ? If IR would drain the Epidural abscess.   Severe sepsis secondary to acute pyelonephritis and bacteremia - present on admission with leukocytosis, tachycardia, hypotension that was fluid responsive, lactic acidosis and AKI.  Imaging findings and UA are consistent with pyelonephritis.   Sepsis physiology has improved and lactic acidosis resolved.  Blood cultures grew Proteus and Enterobacteriaceae spp.  Abscess cultures as above, now on long course Rocephin.   Continue IV antibiotic regimen for ongoing bacteremia and positive blood cultures.  Bilateral hydronephrosis secondary to acute urinary retention -present on admission, CT abdomen showed distended bladder and bilateral hydronephrosis.  Foley catheter removed this AM for voiding trial.  Foley replaced 8/1 (almost 800 cc on bladder scan).  Flomax started 7/29.  MRI lumbar ruled out cauda equina syndrome.   Urology consulted.  Recommend keeping the Foley catheter in place and voiding  trial as outpatient in about 2 weeks.  Start patient on IV fluids patient still has persistent pyuria from hydronephrosis.  Acute kidney injury - Resolved with Foley and IV fluids.  Present on admission with creatinine 2.43, prior baseline 0.69.  Likely multifactorial with  sepsis and hypotension causing prerenal azotemia and post renal secondary to hydronephrosis and acute urinary retention.   Improved with Foley and IVF's.  Foley d/c'd for voiding trial, but replaced on 8/1 due to persistent retention.  Daily BMP to monitor.  Avoid nephrotoxins and hypotension.    Lumbar compression fractures- present on admission, seen on CT abdomen/pelvis in the ED, showed compression fractures of the superior endplate of L4 and inferior endplate of L3, likely acute.  Patient has not complained of back pain throughout admission.  MRI obtained, showed infection as above.  No neurosurgery consult for now given infection at that level.    Acute metabolic encephalopathy - Present on admission likely secondary to infection on top of baseline dementia.  Resolved with treatment of infection.    Transaminitis -present on admission, improving.  AST 271>>103, ALT 24>>64, alk phos 137>>94, normal total bili 1.2>>1.0.  RUQ showed liver echogenicity indicative of fatty infiltration, no focal liver lesions, gallbladder absent..  Suspect due to severe sepsis and shock liver.  AST ALT pending today.  Type 2 diabetes -sliding scale NovoLog, A1c 5.6  Dementia due to Parkinson's disease without behavioral disturbance -no acute issues.  Continue Sinemet, ropinirole  Essential hypertension -not on antihypertensives outpatient  Acquired hypothyroidism -continue levothyroxine, dose increased today to 188.  Hyperlipidemia -continue simvastatin  Depression/anxiety -continue Zoloft, mirtazapine, Celexa, and Xanax  Insomnia -continue melatonin    Pressure Injury 08/20/19 Coccyx Medial Stage 2 -  Partial thickness loss of dermis presenting as a shallow open injury with a red, pink wound bed without slough. (Active)  08/20/19 0500  Location: Coccyx  Location Orientation: Medial  Staging: Stage 2 -  Partial thickness loss of dermis presenting as a shallow open injury with a red, pink wound bed  without slough.  Wound Description (Comments):   Present on Admission: Yes   Patient BMI: Body mass index is 28.68 kg/m.   DVT prophylaxis:  Lovenox.  Diet:  Diet Orders (From admission, onward)    Start     Ordered   08/29/19 0943  Diet Carb Modified Fluid consistency: Thin; Room service appropriate? Yes  Diet effective now       Question Answer Comment  Diet-HS Snack? Nothing   Calorie Level Medium 1600-2000   Fluid consistency: Thin   Room service appropriate? Yes      08/29/19 0943            Code Status: Full Code    Subjective 08/31/19    Patient seen at bedside this morning.  No acute events reported overnight.  Says she is feeling better today.  Just continues to be very tired and weak feeling.  Denies fevers or chills, chest pain or shortness of breath, nausea vomiting diarrhea or other acute complaints.    08/28/19: Repeat Mri shows: IMPRESSION: 1. Fracture of the inferior endplate of L3 is unchanged. Fracture of the superior endplate of L4 shows mild interval progression. 2. 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 Progression of left psoas abscess and improvement in right psoas abscess since prior drainage. There is a posterior epidural fluid collection on the right at L3-4 compatible with abscess. This is likely rising  from infected right facet joint. In addition, there is probable infection in the L3-4 disc space. 3. Scoliosis and degenerative changes throughout the lumbar spine as Above. Pt is alert and awake and pain is better. Requested neurosurgery to evaluate pt  As well due to discitis. Echo reports as below: 1. Left ventricular ejection fraction, by estimation, is 60 to 65%. The left ventricle has normal function. The left ventricle has no regional wall motion abnormalities. Left ventricular diastolic parameters were normal. 2. Right ventricular systolic function is normal. The right  ventricular size is normal. There is normal pulmonary artery systolic pressure. 3. The mitral valve is grossly normal. No evidence of mitral valve regurgitation. 4. The aortic valve is grossly normal. Aortic valve regurgitation is not visualized.  08/29/19 Pt is alert and awake and d/w her about possible left psoas drain which was not needed after d/w IR Dr.Schick.D/W sister gail about care so far and pt being ill and being stable.  Case also d/w Neurosurgery and ID and no change in current plan pt needs 6 weeks of IV abx.Pt reports pain in left leg and back .  08/30/19 Pt is doing better and pain is better controlled. We will get pt on board now.  Drain has output of 20 so far.  08/31/19: Pt seen for f/u clinically she is stable, her pain is better, her appetite is better, drain out put has decreased. Plan for d/c Mon or Tuesday . PT eval.    Disposition Plan & Communication   Status is: Inpatient  Remains inpatient appropriate because:IV treatments appropriate due to intensity of illness or inability to take PO.  Continues to have acute urinary retention after voiding trial.  Urology consult pending.  Spinal infection with cultures pending.  Dispo: The patient is from: SNF [facility])              Anticipated d/c is to: SN[facility])              Anticipated d/c date is: 1-2 days              Patient currently is not medically stable to d/c.   Family Communication:  A/P: Spoke with friend, Vaughan Basta, by phone this afternoon at patient's request.  She and patient's sister do not want her to return to [facility], feel she has not been well cared for there.  Has been to [facility] in the past, and had positive experience there.  Vaughan Basta says patient was ambulatory prior to her admission that led to d/c to rehab at [facility]  Consults, Procedures, Significant Events   Consultants:  Infectious disease. Vascular access team. Case management team.  Procedures:   Foley  placed in the ER 7/27, discontinued 7/30 for void trial  Foley replaced 8/1  8/2 CT guided drain catheter placed in right psoas abscess, aspirated for culture  Antimicrobials:   Rocephin 7/27 >> 7/30  Ampicillin 7/30 >>8/3  Rocephin 8/3 >>Active.  Objective   Vitals:   08/31/19 0522 08/31/19 0734 08/31/19 1139 08/31/19 1530  BP: 122/64 105/79 118/70 (!) 117/59  Pulse: 65 73 71 77  Resp: 16 19 (!) 21 19  Temp: 98.4 F (36.9 C) 97.8 F (36.6 C) 97.7 F (36.5 C) 98.1 F (36.7 C)  TempSrc: Oral Oral Oral Oral  SpO2: 98% 100% 97% 97%  Weight: 75.8 kg     Height:        Intake/Output Summary (Last 24 hours) at 08/31/2019 1808 Last data  filed at 08/31/2019 1731 Gross per 24 hour  Intake 1295 ml  Output 1198 ml  Net 97 ml   Filed Weights   08/28/19 0508 08/30/19 0525 08/31/19 0522  Weight: 74 kg 76.3 kg 75.8 kg    Physical Exam: Blood pressure (!) 117/59, pulse 77, temperature 98.1 F (36.7 C), temperature source Oral, resp. rate 19, height 5' 4" (1.626 m), weight 75.8 kg, SpO2 97 %. General exam: Sleeping, wakes to voice, no acute distress, obese HEENT: Poor dentition, hearing grossly normal Respiratory system: CTAB, respiratory effort. Cardiovascular system: normal S1/S2, RRR, no pitting edema.  Neurologic: No gross focal deficits, normal speech, A&O x4 Extremities: moves all, no cyanosis, normal tone Psychiatry: normal mood, flat affect. GU: Foley in place.  Labs   Data Reviewed: I have personally reviewed following labs and imaging studies  CBC: Recent Labs  Lab 08/25/19 0910 08/26/19 0538 08/27/19 0501 08/28/19 0541 08/31/19 1200  WBC 11.3* 11.2* 11.1* 11.5* 9.5  NEUTROABS 9.0* 8.9* 8.7* 9.0* 7.6  HGB 10.0* 10.5* 10.2* 10.6* 9.9*  HCT 30.3* 30.8* 30.5* 33.1* 30.1*  MCV 93.8 93.1 93.6 97.4 96.2  PLT 277 319 359 345 196   Basic Metabolic Panel: Recent Labs  Lab 08/25/19 0910 08/26/19 0538 08/27/19 0501 08/28/19 0541  NA 136 138 139 138  K 3.9  3.9 3.9 4.1  CL 107 106 107 105  CO2 _0 GLUCOSE 110* 145* 113* 129*  BUN 25* 23 24* 26*  CREATININE 0.65 0.70 0.57 0.55  CALCIUM 7.6* 8.1* 7.9* 8.0*  MG 1.7  --   --  1.8  PHOS  --   --   --  3.4   GFR: Estimated Creatinine Clearance: 65.2 mL/min (by C-G formula based on SCr of 0.55 mg/dL). Liver Function Tests: Recent Labs  Lab 08/27/19 0501 08/28/19 0541  AST 68* 58*  ALT 26 12  ALKPHOS 72 70  BILITOT 0.6 0.5  PROT 6.6 6.8  ALBUMIN 2.0* 2.2*   CBG: Recent Labs  Lab 08/30/19 1659 08/30/19 2112 08/31/19 0737 08/31/19 1221 08/31/19 1546  GLUCAP 145* 109* 167* 138* 158*     Recent Results (from the past 240 hour(s))  CULTURE, BLOOD (ROUTINE X 2) w Reflex to ID Panel     Status: None   Collection Time: 08/23/19 10:33 AM   Specimen: BLOOD  Result Value Ref Range Status   Specimen Description BLOOD LAC  Final   Special Requests   Final    BOTTLES DRAWN AEROBIC AND ANAEROBIC Blood Culture adequate volume   Culture   Final    NO GROWTH 5 DAYS Performed at [facility], [address] [place], [place] [address]    Report Status 08/28/2019 FINAL  Final  CULTURE, BLOOD (ROUTINE X 2) w Reflex to ID Panel     Status: None   Collection Time: 08/23/19 10:34 AM   Specimen: BLOOD  Result Value Ref Range Status   Specimen Description BLOOD LFA  Final   Special Requests   Final    BOTTLES DRAWN AEROBIC AND ANAEROBIC Blood Culture adequate volume   Culture   Final    NO GROWTH 5 DAYS Performed at [facility], [address], [place], [place] [address]    Report Status 08/28/2019 FINAL  Final  Aerobic/Anaerobic Culture (surgical/deep wound)     Status: None   Collection Time: 08/25/19 12:52 PM   Specimen: Abscess  Result Value Ref Range Status   Specimen Description ABSCESS  Final   Special Requests post CT guided paraspinal drain  Final   Gram Stain   Final    ABUNDANT WBC PRESENT, PREDOMINANTLY PMN NO ORGANISMS SEEN    Culture    Final    FEW PROTEUS MIRABILIS NO ANAEROBES ISOLATED Performed at [facility], [address] [place], [place] [address]    Report Status 08/30/2019 FINAL  Final   Organism ID, Bacteria PROTEUS MIRABILIS  Final      Susceptibility   Proteus mirabilis - MIC*    AMPICILLIN <=2 SENSITIVE Sensitive     CEFAZOLIN <=4 SENSITIVE Sensitive     CEFEPIME <=0.12 SENSITIVE Sensitive     CEFTAZIDIME <=1 SENSITIVE Sensitive     CEFTRIAXONE <=0.25 SENSITIVE Sensitive     CIPROFLOXACIN <=0.25 SENSITIVE Sensitive     GENTAMICIN <=1 SENSITIVE Sensitive     IMIPENEM 2 SENSITIVE Sensitive     TRIMETH/SULFA <=20 SENSITIVE Sensitive     AMPICILLIN/SULBACTAM <=2 SENSITIVE Sensitive     PIP/TAZO <=4 SENSITIVE Sensitive     * FEW PROTEUS MIRABILIS  Culture, blood (Routine X 2) w Reflex to ID Panel     Status: None (Preliminary result)   Collection Time: 08/27/19  3:21 PM   Specimen: BLOOD  Result Value Ref Range Status   Specimen Description BLOOD BLOOD LEFT HAND  Final   Special Requests   Final    BOTTLES DRAWN AEROBIC ONLY Blood Culture results may not be optimal due to an inadequate volume of blood received in culture bottles   Culture   Final    NO GROWTH 4 DAYS Performed at [facility], [address] [place], [place] [address]    Report Status PENDING  Incomplete  Culture, blood (Routine X 2) w Reflex to ID Panel     Status: None (Preliminary result)   Collection Time: 08/27/19  3:21 PM   Specimen: BLOOD  Result Value Ref Range Status   Specimen Description BLOOD LEFT ANTECUBITAL  Final   Special Requests   Final    BOTTLES DRAWN AEROBIC ONLY Blood Culture adequate volume   Culture   Final    NO GROWTH 4 DAYS Performed at [facility], [address] [place], [place] [address]    Report Status PENDING  Incomplete      Imaging Studies   No results found.   Medications   Scheduled Meds: . ALPRAZolam  0.25 mg Oral q morning - 10a  . calcium-vitamin D   1 tablet Oral Daily  . carbidopa-levodopa  1 tablet Oral BID  . Chlorhexidine Gluconate Cloth  6 each Topical Q0600  . citalopram  40 mg Oral Daily  . docusate sodium  100 mg Oral BID  . enoxaparin (LOVENOX) injection  40 mg Subcutaneous Q24H  . feeding supplement (ENSURE ENLIVE)  237 mL Oral TID BM  . insulin aspart  0-15 Units Subcutaneous TID WC  . insulin aspart  0-5 Units Subcutaneous QHS  . levothyroxine  175 mcg Oral Q0600  . megestrol  40 mg Oral Daily  . mirtazapine  7.5 mg Oral QHS  . multivitamin with minerals  1 tablet Oral Daily  . pantoprazole  40 mg Oral Daily  . rOPINIRole  0.5 mg Oral BID  . rOPINIRole  1 mg Oral QHS  . senna  1 tablet Oral Daily  . sertraline  50 mg Oral Daily  . simvastatin  40 mg Oral QHS  . sodium chloride flush  10-40 mL Intracatheter  Q12H  . sodium chloride flush  5 mL Intracatheter Q8H  . tamsulosin  0.4 mg Oral QPC supper   Continuous Infusions: . sodium chloride Stopped (08/29/19 2109)  . sodium chloride 75 mL/hr at 08/31/19 1727  . cefTRIAXone (ROCEPHIN)  IV 2 g (08/30/19 2059)     LOS: 12 days   Gretta Cool Quantavia Frith,MD [facility] [phone] 08/31/2019, 6:08 PM    [If 7PM-7AM, please contact night-coverage. How to contact the [facility] Attending or Consulting provider [place] or covering provider during after hours [place], for this patient?    1. Check the care team in [facility] and look for a) attending/consulting [facility] provider listed and b) the [facility] team listed 2. Log into [email or web address] and use [place]'s universal password to access. If you do not have the password, please contact the hospital operator. 3. Locate the [facility] provider you are looking for under [facility] and page to a number that you can be directly reached. 4. If you still have difficulty reaching the provider, please page the [facility] (Director on Call) for the Hospitalists listed on amion for assistance.]

## 2019-09-01 LAB — GLUCOSE, CAPILLARY
Glucose-Capillary: 102 mg/dL — ABNORMAL HIGH (ref 70–99)
Glucose-Capillary: 163 mg/dL — ABNORMAL HIGH (ref 70–99)
Glucose-Capillary: 102 mg/dL — ABNORMAL HIGH (ref 70–99)
Glucose-Capillary: 152 mg/dL — ABNORMAL HIGH (ref 70–99)

## 2019-09-01 LAB — CBC WITH DIFFERENTIAL/PLATELET
Abs Immature Granulocytes: 0.09 10*3/uL — ABNORMAL HIGH (ref 0.00–0.07)
Basophils Absolute: 0 10*3/uL (ref 0.0–0.1)
Basophils Relative: 1 %
Eosinophils Absolute: 0.2 10*3/uL (ref 0.0–0.5)
Eosinophils Relative: 2 %
HCT: 31.2 % — ABNORMAL LOW (ref 36.0–46.0)
Hemoglobin: 10.3 g/dL — ABNORMAL LOW (ref 12.0–15.0)
Immature Granulocytes: 1 %
Lymphocytes Relative: 21 %
Lymphs Abs: 1.8 10*3/uL (ref 0.7–4.0)
MCH: 31.8 pg (ref 26.0–34.0)
MCHC: 33 g/dL (ref 30.0–36.0)
MCV: 96.3 fL (ref 80.0–100.0)
Monocytes Absolute: 0.4 10*3/uL (ref 0.1–1.0)
Monocytes Relative: 5 %
Neutro Abs: 6 10*3/uL (ref 1.7–7.7)
Neutrophils Relative %: 70 %
Platelets: 366 10*3/uL (ref 150–400)
RBC: 3.24 MIL/uL — ABNORMAL LOW (ref 3.87–5.11)
RDW: 14.9 % (ref 11.5–15.5)
WBC: 8.4 10*3/uL (ref 4.0–10.5)
nRBC: 0 % (ref 0.0–0.2)

## 2019-09-01 LAB — CULTURE, BLOOD (ROUTINE X 2)
Culture: NO GROWTH
Culture: NO GROWTH
Special Requests: ADEQUATE

## 2019-09-01 LAB — COMPREHENSIVE METABOLIC PANEL
ALT: 29 U/L (ref 0–44)
AST: 41 U/L (ref 15–41)
Albumin: 2.2 g/dL — ABNORMAL LOW (ref 3.5–5.0)
Alkaline Phosphatase: 61 U/L (ref 38–126)
Anion gap: 6 (ref 5–15)
BUN: 20 mg/dL (ref 8–23)
CO2: 24 mmol/L (ref 22–32)
Calcium: 8.1 mg/dL — ABNORMAL LOW (ref 8.9–10.3)
Chloride: 110 mmol/L (ref 98–111)
Creatinine, Ser: 0.56 mg/dL (ref 0.44–1.00)
GFR calc Af Amer: >60 mL/min (ref >60)
GFR calc non Af Amer: >60 mL/min (ref >60)
Glucose, Bld: 98 mg/dL (ref 70–99)
Potassium: 4.3 mmol/L (ref 3.5–5.1)
Sodium: 140 mmol/L (ref 135–145)
Total Bilirubin: 0.5 mg/dL (ref 0.3–1.2)
Total Protein: 6.3 g/dL — ABNORMAL LOW (ref 6.5–8.1)

## 2019-09-01 LAB — PHOSPHORUS: Phosphorus: 3.5 mg/dL (ref 2.5–4.6)

## 2019-09-01 LAB — MAGNESIUM: Magnesium: 1.7 mg/dL (ref 1.7–2.4)

## 2019-09-01 LAB — C-REACTIVE PROTEIN: CRP: 1.2 mg/dL — ABNORMAL HIGH (ref <1.0)

## 2019-09-01 MED ORDER — HYDROCODONE-ACETAMINOPHEN 5-325 MG PO TABS
1.0000 | ORAL_TABLET | ORAL | Status: DC | PRN
  Administered 2019-09-01 – 2019-09-03 (×6): 1 via ORAL
  Filled 2019-09-01 (×6): qty 1

## 2019-09-01 NOTE — Care Management Important Message (Signed)
Important Message  Patient Details  Name: Theresa Mills MRN: [id number] Date of Birth: 10-12-1950   Medicare Important Message Given:  Yes     Dannette Barbara 09/01/2019, 11:55 AM

## 2019-09-01 NOTE — Progress Notes (Signed)
Pt's lumbar pain not being controlled on Tylenol. I was told in report that the 1mg  IV Dilaudid made her too drowsy. Msg'd Dr. Sloan Leiter requesting something else.

## 2019-09-01 NOTE — Progress Notes (Signed)
PROGRESS NOTE    Theresa Mills  TIR:[id number] DOB: 1951-01-01 DOA: 08/19/2019 PCP: Ronnell Freshwater, NP    Brief Narrative:   Theresa Mills a 69 y.o.femalewith medical history of DM, GERD, anxiety, OSA, Parkinson's with dementia, hypothyroidism, frequent falls, brought in 08/19/19 by EMS after SNF staff called due to shortness of breath and altered mental status. Patient complained of abdominal pain and neck pain but could not provide additional history due to dementia and altered mental status.  In the ED, patient was lethargic, afebrile, hypotensive with BP 83/41, HR 90, RR 24, O2 sat 93% on room air.  Labs were notable for leukocytosis 18k, lactic acidosis 2.5.  UA showed pyuria.  Creatinine was 2.43 (baseline 0.69), procalcitonin 4.62, troponin 28.  CT abdomen pelvis showed distended urinary bladder with bilateral hydronephrosis and findings concerning for pyelonephritis, several nonobstructing left renal calculi.  No focal consolidation was seen on CT chest.  EKG was normal sinus rhythm.  Patient's blood pressure improved with IV fluid bolus.  Started on IV antibiotics and admitted to hospitalist service for further evaluation and management. Patient was ultimately found to have lumbar osteomyelitis and discitis, epidural abscess and right psoas abscess.  Assessment & Plan:   Principal Problem:   Osteomyelitis of lumbar vertebra (HCC) Active Problems:   Dementia due to Parkinson's disease without behavioral disturbance (HCC)   Diabetes mellitus type 2, uncomplicated (HCC)   Essential hypertension   Pressure injury of coccygeal region, stage 2 ([place])   Acquired hypothyroidism   Severe sepsis ([place])   Bilateral hydronephrosis   Acute pyelonephritis   Acute metabolic encephalopathy   Acute urinary retention   AKI (acute kidney injury) ([place])   Malnutrition of moderate degree   Lumbar compression fracture (HCC)   Lumbar discitis  Lumbar osteomyelitis/discitis/epidural  abscess/right psoas abscess, severe sepsis present on admission secondary to acute pyelonephritis and bacteremia: Urine culture, pansensitive Proteus Blood cultures, pansensitive Proteus MRI of the lumbar spine, abnormal fluid signal intensity within L3-L4, paraspinous edema, aspirate positive for Proteus. Right psoas abscess drain placed by IR on 8/3, left psoas abscess small to drain, positive for Proteus. Hydronephrosis and urinary retention status post Foley catheter placement, no evidence of cauda equina.  Will give voiding trial.  Plan: Continue Rocephin as recommended by infectious disease for total 6 weeks of therapy.  Right forearm PICC line present. Right psoas abscess has drain placed by IR, still draining actively, removal planned by IR when collection is less than 5 cc in 24 hours. Patient will need prolonged antibiotic therapy and placement at a skilled nursing facility. Adequate pain management, scheduled Tylenol and hydrocodone.  Parkinson's disease: On multiple medications that she will continue.  Restless leg syndrome: On Requip that she will continue.  Hypothyroidism: On Synthroid that she will continue.   DVT prophylaxis: enoxaparin (LOVENOX) injection 40 mg Start: 08/26/19 2200   Code Status: Full code Family Communication: None today Disposition Plan: Status is: Inpatient  Remains inpatient appropriate because:Ongoing active pain requiring inpatient pain management and IV treatments appropriate due to intensity of illness or inability to take PO   Dispo:  Patient From: [facility]  Planned Disposition: [facility]  Expected discharge date: 08/23/19  Medically stable for discharge: No          Consultants:   Interventional radiology  Infectious disease  Neurosurgery  Procedures:   Multiple procedures  Antimicrobials:  Antibiotics Given (last 72 hours)    Date/Time Action Medication Dose Rate  08/29/19 2126 New  Bag/Given   cefTRIAXone (ROCEPHIN) 2 g in sodium chloride 0.9 % 100 mL IVPB 2 g 200 mL/hr   08/30/19 2059 New Bag/Given   cefTRIAXone (ROCEPHIN) 2 g in sodium chloride 0.9 % 100 mL IVPB 2 g 200 mL/hr   08/31/19 2141 New Bag/Given   cefTRIAXone (ROCEPHIN) 2 g in sodium chloride 0.9 % 100 mL IVPB 2 g 200 mL/hr         Subjective: Patient seen and examined.  Poor historian.  No other overnight events noted by nursing staff.  Patient herself was complaining of pain in her skull, she thinks her skull is split open, also has pain on her both buttocks on laying on bed. Right-sided psoas drain, 38 cc measured last 24 hours.  Afebrile.  Objective: Vitals:   09/01/19 0454 09/01/19 0732 09/01/19 1213 09/01/19 1658  BP: 139/80 135/72 118/62 108/68  Pulse: 71 66 76 68  Resp: 19 18 18 18   Temp: 97.7 F (36.5 C) 97.7 F (36.5 C) 97.7 F (36.5 C) 97.6 F (36.4 C)  TempSrc: Oral  Oral Oral  SpO2: 95% 96% 96% 95%  Weight:      Height:        Intake/Output Summary (Last 24 hours) at 09/01/2019 1755 Last data filed at 09/01/2019 0926 Gross per 24 hour  Intake 780.49 ml  Output 920 ml  Net -139.51 ml   Filed Weights   08/30/19 0525 08/31/19 0522 09/01/19 0449  Weight: 76.3 kg 75.8 kg 79.5 kg    Examination:  General exam: Chronically sick looking, in moderate distress with pain.  Anxious with flat affect. Respiratory system: Clear to auscultation. Respiratory effort normal. Cardiovascular system: S1 & S2 heard, RRR. No JVD, murmurs, rubs, gallops or clicks. No pedal edema. Gastrointestinal system: Right-sided flank drain with thin pus.   Central nervous system: Alert and oriented x2.  Generalized weakness. Foley catheter present with urine sediments.    Data Reviewed: I have personally reviewed following labs and imaging studies  CBC: Recent Labs  Lab 08/26/19 0538 08/27/19 0501 08/28/19 0541 08/31/19 1200 09/01/19 0619  WBC 11.2* 11.1* 11.5* 9.5 8.4  NEUTROABS 8.9* 8.7* 9.0*  7.6 6.0  HGB 10.5* 10.2* 10.6* 9.9* 10.3*  HCT 30.8* 30.5* 33.1* 30.1* 31.2*  MCV 93.1 93.6 97.4 96.2 96.3  PLT 319 359 345 327 403   Basic Metabolic Panel: Recent Labs  Lab 08/26/19 0538 08/27/19 0501 08/28/19 0541 09/01/19 0619  NA 138 139 138 140  K 3.9 3.9 4.1 4.3  CL 106 107 105 110  CO2 24 23 23 24   GLUCOSE 145* 113* 129* 98  BUN 23 24* 26* 20  CREATININE 0.70 0.57 0.55 0.56  CALCIUM 8.1* 7.9* 8.0* 8.1*  MG  --   --  1.8 1.7  PHOS  --   --  3.4 3.5   GFR: Estimated Creatinine Clearance: 66.7 mL/min (by C-G formula based on SCr of 0.56 mg/dL). Liver Function Tests: Recent Labs  Lab 08/27/19 0501 08/28/19 0541 09/01/19 0619  AST 68* 58* 41  ALT 26 12 29   ALKPHOS 72 70 61  BILITOT 0.6 0.5 0.5  PROT 6.6 6.8 6.3*  ALBUMIN 2.0* 2.2* 2.2*   No results for input(s): LIPASE, AMYLASE in the last 168 hours. No results for input(s): AMMONIA in the last 168 hours. Coagulation Profile: No results for input(s): INR, PROTIME in the last 168 hours. Cardiac Enzymes: No results for input(s): CKTOTAL, CKMB, CKMBINDEX, TROPONINI in the last 168  hours. BNP (last 3 results) No results for input(s): PROBNP in the last 8760 hours. HbA1C: No results for input(s): HGBA1C in the last 72 hours. CBG: Recent Labs  Lab 08/31/19 1546 08/31/19 2129 09/01/19 0731 09/01/19 1146 09/01/19 1633  GLUCAP 158* 103* 102* 163* 102*   Lipid Profile: No results for input(s): CHOL, HDL, LDLCALC, TRIG, CHOLHDL, LDLDIRECT in the last 72 hours. Thyroid Function Tests: Recent Labs    08/31/19 1200  TSH 13.319*  FREET4 0.90   Anemia Panel: Recent Labs    08/31/19 1200  VITAMINB12 688   Sepsis Labs: No results for input(s): PROCALCITON, LATICACIDVEN in the last 168 hours.  Recent Results (from the past 240 hour(s))  CULTURE, BLOOD (ROUTINE X 2) w Reflex to ID Panel     Status: None   Collection Time: 08/23/19 10:33 AM   Specimen: BLOOD  Result Value Ref Range Status   Specimen  Description BLOOD LAC  Final   Special Requests   Final    BOTTLES DRAWN AEROBIC AND ANAEROBIC Blood Culture adequate volume   Culture   Final    NO GROWTH 5 DAYS Performed at [facility], [address] [place], [place] [address]    Report Status 08/28/2019 FINAL  Final  CULTURE, BLOOD (ROUTINE X 2) w Reflex to ID Panel     Status: None   Collection Time: 08/23/19 10:34 AM   Specimen: BLOOD  Result Value Ref Range Status   Specimen Description BLOOD LFA  Final   Special Requests   Final    BOTTLES DRAWN AEROBIC AND ANAEROBIC Blood Culture adequate volume   Culture   Final    NO GROWTH 5 DAYS Performed at [facility], [address] [place], [place] [address]    Report Status 08/28/2019 FINAL  Final  Aerobic/Anaerobic Culture (surgical/deep wound)     Status: None   Collection Time: 08/25/19 12:52 PM   Specimen: Abscess  Result Value Ref Range Status   Specimen Description ABSCESS  Final   Special Requests post CT guided paraspinal drain  Final   Gram Stain   Final    ABUNDANT WBC PRESENT, PREDOMINANTLY PMN NO ORGANISMS SEEN    Culture   Final    FEW PROTEUS MIRABILIS NO ANAEROBES ISOLATED Performed at [facility], [place] [address], [place],  [address]    Report Status 08/30/2019 FINAL  Final   Organism ID, Bacteria PROTEUS MIRABILIS  Final      Susceptibility   Proteus mirabilis - MIC*    AMPICILLIN <=2 SENSITIVE Sensitive     CEFAZOLIN <=4 SENSITIVE Sensitive     CEFEPIME <=0.12 SENSITIVE Sensitive     CEFTAZIDIME <=1 SENSITIVE Sensitive     CEFTRIAXONE <=0.25 SENSITIVE Sensitive     CIPROFLOXACIN <=0.25 SENSITIVE Sensitive     GENTAMICIN <=1 SENSITIVE Sensitive     IMIPENEM 2 SENSITIVE Sensitive     TRIMETH/SULFA <=20 SENSITIVE Sensitive     AMPICILLIN/SULBACTAM <=2 SENSITIVE Sensitive     PIP/TAZO <=4 SENSITIVE Sensitive     * FEW PROTEUS MIRABILIS  Culture, blood (Routine X 2) w Reflex to ID Panel     Status: None    Collection Time: 08/27/19  3:21 PM   Specimen: BLOOD  Result Value Ref Range Status   Specimen Description BLOOD BLOOD LEFT HAND  Final   Special Requests   Final    BOTTLES DRAWN AEROBIC ONLY Blood Culture results may not be optimal due to an inadequate  volume of blood received in culture bottles   Culture   Final    NO GROWTH 5 DAYS Performed at [facility], [address] [place], [place] [address]    Report Status 09/01/2019 FINAL  Final  Culture, blood (Routine X 2) w Reflex to ID Panel     Status: None   Collection Time: 08/27/19  3:21 PM   Specimen: BLOOD  Result Value Ref Range Status   Specimen Description BLOOD LEFT ANTECUBITAL  Final   Special Requests   Final    BOTTLES DRAWN AEROBIC ONLY Blood Culture adequate volume   Culture   Final    NO GROWTH 5 DAYS Performed at [facility], [address], [place], [place] [address]    Report Status 09/01/2019 FINAL  Final         Radiology Studies: No results found.      Scheduled Meds: . ALPRAZolam  0.25 mg Oral q morning - 10a  . calcium-vitamin D  1 tablet Oral Daily  . carbidopa-levodopa  1 tablet Oral BID  . Chlorhexidine Gluconate Cloth  6 each Topical Q0600  . citalopram  40 mg Oral Daily  . docusate sodium  100 mg Oral BID  . enoxaparin (LOVENOX) injection  40 mg Subcutaneous Q24H  . feeding supplement (ENSURE ENLIVE)  237 mL Oral TID BM  . insulin aspart  0-15 Units Subcutaneous TID WC  . insulin aspart  0-5 Units Subcutaneous QHS  . levothyroxine  188 mcg Oral Q0600  . megestrol  40 mg Oral Daily  . mirtazapine  7.5 mg Oral QHS  . multivitamin with minerals  1 tablet Oral Daily  . pantoprazole  40 mg Oral Daily  . rOPINIRole  0.5 mg Oral BID  . rOPINIRole  1 mg Oral QHS  . senna  1 tablet Oral Daily  . sertraline  50 mg Oral Daily  . simvastatin  40 mg Oral QHS  . sodium chloride flush  10-40 mL Intracatheter Q12H  . sodium chloride flush  5 mL Intracatheter Q8H  .  tamsulosin  0.4 mg Oral QPC supper   Continuous Infusions: . sodium chloride Stopped (08/29/19 2109)  . cefTRIAXone (ROCEPHIN)  IV 2 g (08/31/19 2141)     LOS: 13 days    Time spent: 30 minutes    Barb Merino, MD [facility] Pager [phone]

## 2019-09-02 ENCOUNTER — Encounter: Payer: Self-pay | Admitting: Internal Medicine

## 2019-09-02 ENCOUNTER — Inpatient Hospital Stay: Payer: Medicare Other

## 2019-09-02 LAB — CBC WITH DIFFERENTIAL/PLATELET
Abs Immature Granulocytes: 0.06 10*3/uL (ref 0.00–0.07)
Basophils Absolute: 0 10*3/uL (ref 0.0–0.1)
Basophils Relative: 1 %
Eosinophils Absolute: 0.2 10*3/uL (ref 0.0–0.5)
Eosinophils Relative: 3 %
HCT: 34.9 % — ABNORMAL LOW (ref 36.0–46.0)
Hemoglobin: 10.9 g/dL — ABNORMAL LOW (ref 12.0–15.0)
Immature Granulocytes: 1 %
Lymphocytes Relative: 22 %
Lymphs Abs: 1.6 10*3/uL (ref 0.7–4.0)
MCH: 31.3 pg (ref 26.0–34.0)
MCHC: 31.2 g/dL (ref 30.0–36.0)
MCV: 100.3 fL — ABNORMAL HIGH (ref 80.0–100.0)
Monocytes Absolute: 0.4 10*3/uL (ref 0.1–1.0)
Monocytes Relative: 5 %
Neutro Abs: 4.8 10*3/uL (ref 1.7–7.7)
Neutrophils Relative %: 68 %
Platelets: 295 10*3/uL (ref 150–400)
RBC: 3.48 MIL/uL — ABNORMAL LOW (ref 3.87–5.11)
RDW: 15.7 % — ABNORMAL HIGH (ref 11.5–15.5)
WBC: 7.1 10*3/uL (ref 4.0–10.5)
nRBC: 0 % (ref 0.0–0.2)

## 2019-09-02 LAB — COMPREHENSIVE METABOLIC PANEL
ALT: 8 U/L (ref 0–44)
AST: 33 U/L (ref 15–41)
Albumin: 2.2 g/dL — ABNORMAL LOW (ref 3.5–5.0)
Alkaline Phosphatase: 60 U/L (ref 38–126)
Anion gap: 7 (ref 5–15)
BUN: 23 mg/dL (ref 8–23)
CO2: 24 mmol/L (ref 22–32)
Calcium: 8.2 mg/dL — ABNORMAL LOW (ref 8.9–10.3)
Chloride: 109 mmol/L (ref 98–111)
Creatinine, Ser: 0.52 mg/dL (ref 0.44–1.00)
GFR calc Af Amer: >60 mL/min (ref >60)
GFR calc non Af Amer: >60 mL/min (ref >60)
Glucose, Bld: 88 mg/dL (ref 70–99)
Potassium: 4.1 mmol/L (ref 3.5–5.1)
Sodium: 140 mmol/L (ref 135–145)
Total Bilirubin: 0.5 mg/dL (ref 0.3–1.2)
Total Protein: 6.5 g/dL (ref 6.5–8.1)

## 2019-09-02 LAB — C-REACTIVE PROTEIN: CRP: 0.8 mg/dL (ref <1.0)

## 2019-09-02 LAB — GLUCOSE, CAPILLARY
Glucose-Capillary: 77 mg/dL (ref 70–99)
Glucose-Capillary: 119 mg/dL — ABNORMAL HIGH (ref 70–99)
Glucose-Capillary: 108 mg/dL — ABNORMAL HIGH (ref 70–99)
Glucose-Capillary: 105 mg/dL — ABNORMAL HIGH (ref 70–99)

## 2019-09-02 LAB — SARS CORONAVIRUS 2 BY RT PCR (HOSPITAL ORDER, PERFORMED IN ~~LOC~~ HOSPITAL LAB): SARS Coronavirus 2: NEGATIVE

## 2019-09-02 MED ORDER — CEFTRIAXONE IV (FOR PTA / DISCHARGE USE ONLY)
2.0000 g | INTRAVENOUS | 0 refills | Status: DC
Start: 2019-09-03 — End: 2019-10-01

## 2019-09-02 MED ORDER — ALPRAZOLAM 0.25 MG PO TABS: 0.2500 mg | ORAL_TABLET | Freq: Two times a day (BID) | ORAL | 0 refills | Status: DC | PRN

## 2019-09-02 MED ORDER — IOHEXOL 300 MG/ML  SOLN
100.0000 mL | Freq: Once | INTRAMUSCULAR | Status: AC | PRN
  Administered 2019-09-02: 100 mL via INTRAVENOUS

## 2019-09-02 MED ORDER — HYDROCODONE-ACETAMINOPHEN 5-325 MG PO TABS: 1.0000 | ORAL_TABLET | Freq: Four times a day (QID) | ORAL | 0 refills | Status: AC | PRN

## 2019-09-02 NOTE — Progress Notes (Signed)
Supervising Physician: Aletta Edouard   Patient Status: [facility] - In-pt  Subjective: S/p (R)psoas abscess drain placement on 8/2. Has been on abx Feeling better Supposed to be for discharge to SNF soon. Reports minimal output. D/w attending MD, repeat CT ordered for today to re-eval.  Objective: Physical Exam: BP 112/73 (BP Location: Right Arm)   Pulse 69   Temp 97.7 F (36.5 C) (Oral)   Resp 18   Ht 5\' 4"  (1.626 m)   Wt 77.2 kg   SpO2 95%   BMI 29.21 kg/m  (R)post back drain intact, some old purulent stranding in tubing but serous output in bulb.   Current Facility-Administered Medications:  .  0.9 %  sodium chloride infusion, , Intravenous, PRN, Para Skeans, MD, Stopped at 08/29/19 2109 .  acetaminophen (TYLENOL) tablet 650 mg, 650 mg, Oral, Q6H PRN, 650 mg at 09/01/19 0834 **OR** acetaminophen (TYLENOL) suppository 650 mg, 650 mg, Rectal, Q6H PRN, Athena Masse, MD .  ALPRAZolam Duanne Moron) tablet 0.25 mg, 0.25 mg, Oral, q morning - 10a, Nicole Kindred A, DO, 0.25 mg at 09/02/19 0905 .  ALPRAZolam Duanne Moron) tablet 0.5 mg, 0.5 mg, Oral, QHS PRN, Nicole Kindred A, DO, 0.5 mg at 08/22/19 2003 .  calcium-vitamin D (OSCAL WITH D) 500-200 MG-UNIT per tablet 1 tablet, 1 tablet, Oral, Daily, Nicole Kindred A, DO, 1 tablet at 09/02/19 0905 .  carbidopa-levodopa (SINEMET CR) 50-200 MG per tablet controlled release 1 tablet, 1 tablet, Oral, BID, Ezekiel Slocumb, DO, 1 tablet at 09/02/19 0906 .  cefTRIAXone (ROCEPHIN) 2 g in sodium chloride 0.9 % 100 mL IVPB, 2 g, Intravenous, Q24H, Ravishankar, Jayashree, MD, Last Rate: 200 mL/hr at 09/02/19 1353, 2 g at 09/02/19 1353 .  celecoxib (CELEBREX) capsule 200 mg, 200 mg, Oral, BID PRN, Nicole Kindred A, DO, 200 mg at 09/01/19 2038 .  Chlorhexidine Gluconate Cloth 2 % PADS 6 each, 6 each, Topical, Q0600, Ezekiel Slocumb, DO, 6 each at 09/02/19 0509 .  citalopram (CELEXA) tablet 40 mg, 40 mg, Oral, Daily, Nicole Kindred A, DO, 40 mg  at 09/02/19 0905 .  docusate sodium (COLACE) capsule 100 mg, 100 mg, Oral, BID, Nicole Kindred A, DO, 100 mg at 08/31/19 2142 .  enoxaparin (LOVENOX) injection 40 mg, 40 mg, Subcutaneous, Q24H, Nicole Kindred A, DO, 40 mg at 09/01/19 2048 .  feeding supplement (ENSURE ENLIVE) (ENSURE ENLIVE) liquid 237 mL, 237 mL, Oral, TID BM, Griffith, Kelly A, DO, 237 mL at 09/02/19 1353 .  HYDROcodone-acetaminophen (NORCO/VICODIN) 5-325 MG per tablet 1 tablet, 1 tablet, Oral, Q4H PRN, Barb Merino, MD, 1 tablet at 09/02/19 0905 .  hydrocortisone cream 1 % 1 application, 1 application, Topical, TID PRN, Florina Ou V, MD .  insulin aspart (novoLOG) injection 0-15 Units, 0-15 Units, Subcutaneous, TID WC, Athena Masse, MD, 3 Units at 09/01/19 1233 .  insulin aspart (novoLOG) injection 0-5 Units, 0-5 Units, Subcutaneous, QHS, Judd Gaudier V, MD .  levothyroxine (SYNTHROID) tablet 188 mcg, 188 mcg, Oral, Q0600, Para Skeans, MD, 188 mcg at 09/02/19 0508 .  megestrol (MEGACE) tablet 40 mg, 40 mg, Oral, Daily, Nicole Kindred A, DO, 40 mg at 09/02/19 0906 .  melatonin tablet 5 mg, 5 mg, Oral, QHS PRN, Nicole Kindred A, DO, 5 mg at 08/27/19 2203 .  mirtazapine (REMERON) tablet 7.5 mg, 7.5 mg, Oral, QHS, Nicole Kindred A, DO, 7.5 mg at 09/01/19 2037 .  multivitamin with minerals tablet 1 tablet, 1 tablet, Oral, Daily,  Nicole Kindred A, DO, 1 tablet at 09/02/19 6962 .  ondansetron (ZOFRAN) tablet 4 mg, 4 mg, Oral, Q6H PRN **OR** ondansetron (ZOFRAN) injection 4 mg, 4 mg, Intravenous, Q6H PRN, Athena Masse, MD .  pantoprazole (PROTONIX) EC tablet 40 mg, 40 mg, Oral, Daily, Nicole Kindred A, DO, 40 mg at 09/02/19 0905 .  rOPINIRole (REQUIP) tablet 0.5 mg, 0.5 mg, Oral, BID, Nicole Kindred A, DO, 0.5 mg at 09/02/19 0904 .  rOPINIRole (REQUIP) tablet 1 mg, 1 mg, Oral, QHS, Nicole Kindred A, DO, 1 mg at 09/01/19 2038 .  senna (SENOKOT) tablet 8.6 mg, 1 tablet, Oral, Daily, Florina Ou V, MD, 8.6 mg at  08/31/19 0936 .  sertraline (ZOLOFT) tablet 50 mg, 50 mg, Oral, Daily, Nicole Kindred A, DO, 50 mg at 09/02/19 0905 .  simvastatin (ZOCOR) tablet 40 mg, 40 mg, Oral, QHS, Nicole Kindred A, DO, 40 mg at 09/01/19 2037 .  sodium chloride flush (NS) 0.9 % injection 10-40 mL, 10-40 mL, Intracatheter, Q12H, Para Skeans, MD, 10 mL at 09/02/19 0914 .  sodium chloride flush (NS) 0.9 % injection 10-40 mL, 10-40 mL, Intracatheter, PRN, Florina Ou V, MD .  sodium chloride flush (NS) 0.9 % injection 5 mL, 5 mL, Intracatheter, Q8H, Sandi Mariscal, MD, 5 mL at 09/02/19 0509 .  tamsulosin (FLOMAX) capsule 0.4 mg, 0.4 mg, Oral, QPC supper, Nicole Kindred A, DO, 0.4 mg at 09/01/19 1806  Labs: CBC Recent Labs    09/01/19 0619 09/02/19 0538  WBC 8.4 7.1  HGB 10.3* 10.9*  HCT 31.2* 34.9*  PLT 366 295   BMET Recent Labs    09/01/19 0619 09/02/19 0538  NA 140 140  K 4.3 4.1  CL 110 109  CO2 24 24  GLUCOSE 98 88  BUN 20 23  CREATININE 0.56 0.52  CALCIUM 8.1* 8.2*   LFT Recent Labs    09/02/19 0538  PROT 6.5  ALBUMIN 2.2*  AST 33  ALT 8  ALKPHOS 60  BILITOT 0.5   PT/INR No results for input(s): LABPROT, INR in the last 72 hours.   Studies/Results: CT ABDOMEN PELVIS W CONTRAST  Result Date: 09/02/2019 CLINICAL DATA:  Status post percutaneous catheter drainage right iliopsoas intramuscular abscess 08/25/2019 with minimal fluid return from the drain currently. EXAM: CT ABDOMEN AND PELVIS WITH CONTRAST TECHNIQUE: Multidetector CT imaging of the abdomen and pelvis was performed using the standard protocol following bolus administration of intravenous contrast. CONTRAST:  168mL OMNIPAQUE IOHEXOL 300 MG/ML  SOLN COMPARISON:  08/25/2019 FINDINGS: Lower chest: No acute abnormality. Hepatobiliary: No focal liver abnormality is seen. Stable hepatic cysts. Status post cholecystectomy. No biliary dilatation. Pancreas: Unremarkable. No pancreatic ductal dilatation or surrounding inflammatory changes.  Spleen: Normal in size without focal abnormality. Adrenals/Urinary Tract: Adrenal glands are unremarkable. Kidneys are normal, without renal calculi, focal lesion, or hydronephrosis. Bladder is unremarkable. Stomach/Bowel: Bowel shows no evidence of obstruction, ileus, inflammation or lesion. No free air identified. Vascular/Lymphatic: Calcified plaque in the abdominal aorta without evidence of aneurysm. No enlarged lymph nodes identified. Reproductive: Status post hysterectomy. No adnexal masses. Other: Percutaneous drain remains present within the right iliopsoas muscle at roughly the L4 level. No residual fluid collection identified. No additional fluid collections seen. Small umbilical hernia contains fat. Musculoskeletal: No acute or significant osseous findings. IMPRESSION: Resolution of right iliopsoas abscess after percutaneous drain placement. Aortic Atherosclerosis (ICD10-I70.0). Electronically Signed   By: Aletta Edouard M.D.   On: 09/02/2019 14:36    Assessment/Plan: (R)psoas abscess s/p  drain Repeat CT today looks good. Reviewed with Dr. Morrie Sheldon removed at bedside without difficulty. Dressing placed.    LOS: 14 days   I spent a total of 15 minutes in face to face in clinical consultation, greater than 50% of which was counseling/coordinating care for (R) psoas abscess drain  Ascencion Dike PA-C 09/02/2019 4:00 PM

## 2019-09-02 NOTE — Progress Notes (Signed)
PROGRESS NOTE    Theresa Mills  ZOX:[id number] DOB: [birth year] DOA: 08/19/2019 PCP: Ronnell Freshwater, NP    Brief Narrative:   Heavin Sebree Goochis a 69 y.o.femalewith medical history of DM, GERD, anxiety, OSA, Parkinson's with dementia, hypothyroidism, frequent falls, brought in 08/19/19 by EMS after SNF staff called due to shortness of breath and altered mental status. Patient complained of abdominal pain and neck pain but could not provide additional history due to dementia and altered mental status.  In the ED, patient was lethargic, afebrile, hypotensive with BP 83/41, HR 90, RR 24, O2 sat 93% on room air.  Labs were notable for leukocytosis 18k, lactic acidosis 2.5.  UA showed pyuria.  Creatinine was 2.43 (baseline 0.69), procalcitonin 4.62, troponin 28.  CT abdomen pelvis showed distended urinary bladder with bilateral hydronephrosis and findings concerning for pyelonephritis, several nonobstructing left renal calculi.  No focal consolidation was seen on CT chest.  EKG was normal sinus rhythm.  Patient's blood pressure improved with IV fluid bolus.  Started on IV antibiotics and admitted to hospitalist service for further evaluation and management. Patient was ultimately found to have lumbar osteomyelitis and discitis, epidural abscess and right psoas abscess.  Assessment & Plan:   Principal Problem:   Osteomyelitis of lumbar vertebra (HCC) Active Problems:   Dementia due to Parkinson's disease without behavioral disturbance (HCC)   Diabetes mellitus type 2, uncomplicated (HCC)   Essential hypertension   Pressure injury of coccygeal region, stage 2 ([place])   Acquired hypothyroidism   Severe sepsis ([place])   Bilateral hydronephrosis   Acute pyelonephritis   Acute metabolic encephalopathy   Acute urinary retention   AKI (acute kidney injury) ([place])   Malnutrition of moderate degree   Lumbar compression fracture (HCC)   Lumbar discitis  Lumbar osteomyelitis/discitis/epidural  abscess/right psoas abscess, severe sepsis present on admission secondary to acute pyelonephritis and bacteremia: Urine culture, pansensitive Proteus Blood cultures, pansensitive Proteus MRI of the lumbar spine, abnormal fluid signal intensity within L3-L4, paraspinous edema, aspirate positive for Proteus. Right psoas abscess drain placed by IR on 8/3, left psoas abscess small to drain, positive for Proteus. Hydronephrosis and urinary retention status post Foley catheter placement, no evidence of cauda equina.  Will give voiding trial.  Plan: Continue Rocephin as recommended by infectious disease for total 6 weeks of therapy.  Right forearm PICC line present. Right psoas abscess has drain placed by IR, no active drainage.  Repeat CT scan is stable.  Discussed with interventional radiology, to be removed today.  Patient will need prolonged antibiotic therapy and placement at a skilled nursing facility. Adequate pain management, scheduled Tylenol and hydrocodone.  Parkinson's disease: On multiple medications that she will continue.  Restless leg syndrome: On Requip that she will continue.  Hypothyroidism: On Synthroid that she will continue.   DVT prophylaxis: enoxaparin (LOVENOX) injection 40 mg Start: 08/26/19 2200   Code Status: Full code Family Communication: Dorothy Spark on the phone. Disposition Plan: Status is: Inpatient  Remains inpatient appropriate because:Ongoing active pain requiring inpatient pain management and IV treatments appropriate due to intensity of illness or inability to take PO   Dispo:  Patient From: [facility]  Planned Disposition: [place]  Expected discharge date: Tomorrow.  Medically stable for discharge: Yes.          Consultants:   Interventional radiology  Infectious disease  Neurosurgery  Procedures:   Multiple procedures  Antimicrobials:  Antibiotics Given (last 72 hours)    Date/Time  Action  Medication Dose Rate   08/30/19 2059 New Bag/Given   cefTRIAXone (ROCEPHIN) 2 g in sodium chloride 0.9 % 100 mL IVPB 2 g 200 mL/hr   08/31/19 2141 New Bag/Given   cefTRIAXone (ROCEPHIN) 2 g in sodium chloride 0.9 % 100 mL IVPB 2 g 200 mL/hr   09/01/19 2038 New Bag/Given   cefTRIAXone (ROCEPHIN) 2 g in sodium chloride 0.9 % 100 mL IVPB 2 g 200 mL/hr   09/02/19 1353 New Bag/Given   cefTRIAXone (ROCEPHIN) 2 g in sodium chloride 0.9 % 100 mL IVPB 2 g 200 mL/hr         Subjective: Patient seen and examined.  Poor historian.  Hydrocodone did help with the pain.  Denies any nausea vomiting.  Objective: Vitals:   09/01/19 1658 09/01/19 2000 09/02/19 0541 09/02/19 0726  BP: 108/68 (!) 114/59 133/69 112/73  Pulse: 68   69  Resp: 18 18 16 18   Temp: 97.6 F (36.4 C) 97.6 F (36.4 C) 98 F (36.7 C) 97.7 F (36.5 C)  TempSrc: Oral Oral Oral Oral  SpO2: 95% 95% 95% 95%  Weight:   77.2 kg   Height:        Intake/Output Summary (Last 24 hours) at 09/02/2019 1533 Last data filed at 09/02/2019 1356 Gross per 24 hour  Intake 1165 ml  Output 2200 ml  Net -1035 ml   Filed Weights   08/31/19 0522 09/01/19 0449 09/02/19 0541  Weight: 75.8 kg 79.5 kg 77.2 kg    Examination:  General exam: Chronically sick looking, not in distress.  Anxious with flat affect. Respiratory system: Clear to auscultation. Respiratory effort normal. Cardiovascular system: S1 & S2 heard, RRR. No JVD, murmurs, rubs, gallops or clicks. No pedal edema. Gastrointestinal system: Right-sided flank drain with thin fluid. Central nervous system: Alert and oriented x2.  Generalized weakness. Foley catheter present with urine sediments.    Data Reviewed: I have personally reviewed following labs and imaging studies  CBC: Recent Labs  Lab 08/27/19 0501 08/28/19 0541 08/31/19 1200 09/01/19 0619 09/02/19 0538  WBC 11.1* 11.5* 9.5 8.4 7.1  NEUTROABS 8.7* 9.0* 7.6 6.0 4.8  HGB 10.2* 10.6* 9.9* 10.3* 10.9*  HCT  30.5* 33.1* 30.1* 31.2* 34.9*  MCV 93.6 97.4 96.2 96.3 100.3*  PLT 359 345 327 366 371   Basic Metabolic Panel: Recent Labs  Lab 08/27/19 0501 08/28/19 0541 09/01/19 0619 09/02/19 0538  NA 139 138 140 140  K 3.9 4.1 4.3 4.1  CL 107 105 110 109  CO2 23 23 24 24   GLUCOSE 113* 129* 98 88  BUN 24* 26* 20 23  CREATININE 0.57 0.55 0.56 0.52  CALCIUM 7.9* 8.0* 8.1* 8.2*  MG  --  1.8 1.7  --   PHOS  --  3.4 3.5  --    GFR: Estimated Creatinine Clearance: 65.8 mL/min (by C-G formula based on SCr of 0.52 mg/dL). Liver Function Tests: Recent Labs  Lab 08/27/19 0501 08/28/19 0541 09/01/19 0619 09/02/19 0538  AST 68* 58* 41 33  ALT 26 12 29 8   ALKPHOS 72 70 61 60  BILITOT 0.6 0.5 0.5 0.5  PROT 6.6 6.8 6.3* 6.5  ALBUMIN 2.0* 2.2* 2.2* 2.2*   No results for input(s): LIPASE, AMYLASE in the last 168 hours. No results for input(s): AMMONIA in the last 168 hours. Coagulation Profile: No results for input(s): INR, PROTIME in the last 168 hours. Cardiac Enzymes: No results for input(s): CKTOTAL, CKMB, CKMBINDEX, TROPONINI in the last 168  hours. BNP (last 3 results) No results for input(s): PROBNP in the last 8760 hours. HbA1C: No results for input(s): HGBA1C in the last 72 hours. CBG: Recent Labs  Lab 09/01/19 1146 09/01/19 1633 09/01/19 2107 09/02/19 0724 09/02/19 1125  GLUCAP 163* 102* 152* 77 119*   Lipid Profile: No results for input(s): CHOL, HDL, LDLCALC, TRIG, CHOLHDL, LDLDIRECT in the last 72 hours. Thyroid Function Tests: Recent Labs    08/31/19 1200  TSH 13.319*  FREET4 0.90   Anemia Panel: Recent Labs    08/31/19 1200  VITAMINB12 688   Sepsis Labs: No results for input(s): PROCALCITON, LATICACIDVEN in the last 168 hours.  Recent Results (from the past 240 hour(s))  Aerobic/Anaerobic Culture (surgical/deep wound)     Status: None   Collection Time: 08/25/19 12:52 PM   Specimen: Abscess  Result Value Ref Range Status   Specimen Description ABSCESS   Final   Special Requests post CT guided paraspinal drain  Final   Gram Stain   Final    ABUNDANT WBC PRESENT, PREDOMINANTLY PMN NO ORGANISMS SEEN    Culture   Final    FEW PROTEUS MIRABILIS NO ANAEROBES ISOLATED Performed at [facility], [address] [place], [place] [address]    Report Status 08/30/2019 FINAL  Final   Organism ID, Bacteria PROTEUS MIRABILIS  Final      Susceptibility   Proteus mirabilis - MIC*    AMPICILLIN <=2 SENSITIVE Sensitive     CEFAZOLIN <=4 SENSITIVE Sensitive     CEFEPIME <=0.12 SENSITIVE Sensitive     CEFTAZIDIME <=1 SENSITIVE Sensitive     CEFTRIAXONE <=0.25 SENSITIVE Sensitive     CIPROFLOXACIN <=0.25 SENSITIVE Sensitive     GENTAMICIN <=1 SENSITIVE Sensitive     IMIPENEM 2 SENSITIVE Sensitive     TRIMETH/SULFA <=20 SENSITIVE Sensitive     AMPICILLIN/SULBACTAM <=2 SENSITIVE Sensitive     PIP/TAZO <=4 SENSITIVE Sensitive     * FEW PROTEUS MIRABILIS  Culture, blood (Routine X 2) w Reflex to ID Panel     Status: None   Collection Time: 08/27/19  3:21 PM   Specimen: BLOOD  Result Value Ref Range Status   Specimen Description BLOOD BLOOD LEFT HAND  Final   Special Requests   Final    BOTTLES DRAWN AEROBIC ONLY Blood Culture results may not be optimal due to an inadequate volume of blood received in culture bottles   Culture   Final    NO GROWTH 5 DAYS Performed at [facility], [address], [place], [place] [address]    Report Status 09/01/2019 FINAL  Final  Culture, blood (Routine X 2) w Reflex to ID Panel     Status: None   Collection Time: 08/27/19  3:21 PM   Specimen: BLOOD  Result Value Ref Range Status   Specimen Description BLOOD LEFT ANTECUBITAL  Final   Special Requests   Final    BOTTLES DRAWN AEROBIC ONLY Blood Culture adequate volume   Culture   Final    NO GROWTH 5 DAYS Performed at [facility], [address], [place], [place] [address]    Report Status 09/01/2019 FINAL  Final          Radiology Studies: CT ABDOMEN PELVIS W CONTRAST  Result Date: 09/02/2019 CLINICAL DATA:  Status post percutaneous catheter drainage right iliopsoas intramuscular abscess 08/25/2019 with minimal fluid return from the drain currently. EXAM: CT ABDOMEN AND PELVIS WITH CONTRAST TECHNIQUE: Multidetector CT imaging of the  abdomen and pelvis was performed using the standard protocol following bolus administration of intravenous contrast. CONTRAST:  132mL OMNIPAQUE IOHEXOL 300 MG/ML  SOLN COMPARISON:  08/25/2019 FINDINGS: Lower chest: No acute abnormality. Hepatobiliary: No focal liver abnormality is seen. Stable hepatic cysts. Status post cholecystectomy. No biliary dilatation. Pancreas: Unremarkable. No pancreatic ductal dilatation or surrounding inflammatory changes. Spleen: Normal in size without focal abnormality. Adrenals/Urinary Tract: Adrenal glands are unremarkable. Kidneys are normal, without renal calculi, focal lesion, or hydronephrosis. Bladder is unremarkable. Stomach/Bowel: Bowel shows no evidence of obstruction, ileus, inflammation or lesion. No free air identified. Vascular/Lymphatic: Calcified plaque in the abdominal aorta without evidence of aneurysm. No enlarged lymph nodes identified. Reproductive: Status post hysterectomy. No adnexal masses. Other: Percutaneous drain remains present within the right iliopsoas muscle at roughly the L4 level. No residual fluid collection identified. No additional fluid collections seen. Small umbilical hernia contains fat. Musculoskeletal: No acute or significant osseous findings. IMPRESSION: Resolution of right iliopsoas abscess after percutaneous drain placement. Aortic Atherosclerosis (ICD10-I70.0). Electronically Signed   By: Aletta Edouard M.D.   On: 09/02/2019 14:36        Scheduled Meds: . ALPRAZolam  0.25 mg Oral q morning - 10a  . calcium-vitamin D  1 tablet Oral Daily  . carbidopa-levodopa  1 tablet Oral BID  . Chlorhexidine Gluconate  Cloth  6 each Topical Q0600  . citalopram  40 mg Oral Daily  . docusate sodium  100 mg Oral BID  . enoxaparin (LOVENOX) injection  40 mg Subcutaneous Q24H  . feeding supplement (ENSURE ENLIVE)  237 mL Oral TID BM  . insulin aspart  0-15 Units Subcutaneous TID WC  . insulin aspart  0-5 Units Subcutaneous QHS  . levothyroxine  188 mcg Oral Q0600  . megestrol  40 mg Oral Daily  . mirtazapine  7.5 mg Oral QHS  . multivitamin with minerals  1 tablet Oral Daily  . pantoprazole  40 mg Oral Daily  . rOPINIRole  0.5 mg Oral BID  . rOPINIRole  1 mg Oral QHS  . senna  1 tablet Oral Daily  . sertraline  50 mg Oral Daily  . simvastatin  40 mg Oral QHS  . sodium chloride flush  10-40 mL Intracatheter Q12H  . sodium chloride flush  5 mL Intracatheter Q8H  . tamsulosin  0.4 mg Oral QPC supper   Continuous Infusions: . sodium chloride Stopped (08/29/19 2109)  . cefTRIAXone (ROCEPHIN)  IV 2 g (09/02/19 1353)     LOS: 14 days    Time spent: 30 minutes    Barb Merino, MD [facility] Pager [phone]

## 2019-09-02 NOTE — Progress Notes (Addendum)
ID   Patient Vitals for the past 24 hrs:  BP Temp Temp src Pulse Resp SpO2 Weight  09/02/19 0726 112/73 97.7 F (36.5 C) Oral 69 18 95 % --  09/02/19 0541 133/69 98 F (36.7 C) Oral -- 16 95 % 77.2 kg  09/01/19 2000 (!) 114/59 97.6 F (36.4 C) Oral -- 18 95 % --  09/01/19 1658 108/68 97.6 F (36.4 C) Oral 68 18 95 % --    Pt doing better Alert and oriented, pale and chronically ill Rt psoas drain present-minimal fluid  Labs CBC Latest Ref Rng & Units 09/02/2019 09/01/2019 08/31/2019  WBC 4.0 - 10.5 K/uL 7.1 8.4 9.5  Hemoglobin 12.0 - 15.0 g/dL 10.9(L) 10.3(L) 9.9(L)  Hematocrit 36 - 46 % 34.9(L) 31.2(L) 30.1(L)  Platelets 150 - 400 K/uL 295 366 327    CMP Latest Ref Rng & Units 09/02/2019 09/01/2019 08/28/2019  Glucose 70 - 99 mg/dL 88 98 129(H)  BUN 8 - 23 mg/dL 23 20 26(H)  Creatinine 0.44 - 1.00 mg/dL 0.52 0.56 0.55  Sodium 135 - 145 mmol/L 140 140 138  Potassium 3.5 - 5.1 mmol/L 4.1 4.3 4.1  Chloride 98 - 111 mmol/L 109 110 105  CO2 22 - 32 mmol/L 24 24 23   Calcium 8.9 - 10.3 mg/dL 8.2(L) 8.1(L) 8.0(L)  Total Protein 6.5 - 8.1 g/dL 6.5 6.3(L) 6.8  Total Bilirubin 0.3 - 1.2 mg/dL 0.5 0.5 0.5  Alkaline Phos 38 - 126 U/L 60 61 70  AST 15 - 41 U/L 33 41 58(H)  ALT 0 - 44 U/L 8 29 12    Impression/recommendation  69 year old female with history of Parkinson's disease, neurocognitive impairment, frequent falls, presents from nursing home.  Proteus bacteremia secondary to Proteus urinary tract infection.  Urinary retention leading to bilateral hydronephrosis. The reason for urinary retention is unclear. Rule out neurogenic bladder VS medication induced -  AKI secondary to urinary retention- resolved  L3-L4 discitis/osteomyelitis, right psoas, left small abscess, L3-L4 facet joint arthritis/fluid. The abscess was aspirated and showed proteus  On ceftriaxone - Seen by neurosurgery and no intervention needed for any of the findings  As repeat MRI shows improvement in rt  psoas collection  drain should be removed as output is much less ( documented as 5 cc) Continue ceftriaxone until -10/06/19  Anemia  Parkinson's disease on  meds  Restlesslegsyndrome on Requip  Hypothyroidism on Synthroid  Pt needs PT  Discussed the management with her P.S- rt psoas drain has been removed today after repeat CT

## 2019-09-02 NOTE — Progress Notes (Signed)
Nutrition Follow Up Note   DOCUMENTATION CODES:   Non-severe (moderate) malnutrition in context of chronic illness  INTERVENTION:   Continue Ensure Enlive po TID, each supplement provides 350 kcal and 20 grams of protein  Continue Magic cup TID with meals, each supplement provides 290 kcal and 9 grams of protein  Continue MVI daily   NUTRITION DIAGNOSIS:   Moderate Malnutrition related to chronic illness (parkinsons, dementia) as evidenced by mild fat depletion, mild muscle depletion, 18 percent weight loss in < 1 year.  GOAL:   Patient will meet greater than or equal to 90% of their needs  -progressing   MONITOR:   PO intake, Supplement acceptance, Labs, Weight trends, Skin, I & O's  ASSESSMENT:   69 y.o. female with medical history significant for DM, GERD, anxiety, OSA, Parkinson's with dementia, hypothyroidism, frequent falls, brought in by EMS after home reported shortness of breath and altered mental status. Pt found to have pyelonephritis and sepsis  Pt s/p IR drain 8/2 for paraspinal abscess  Pt with improved appetite and oral intake after addition of megace; pt eating 100% of meals and drinking supplements. Refeed labs stable. Per chart, pt is weight stable since admit.   Medications reviewed and include: oscal w/ D, celexa, colace, lovenox, insulin, synthroid, remeron, megace, protonix, senokot, MVI, ceftriaxone   Labs reviewed: K 4.1 wnl, P 3.5 wnl Hgb 10.9(L), Hct 34.9(L) cbgs- 77, 119 x 24 hrs  Diet Order:    Diet Order            Diet Carb Modified Fluid consistency: Thin; Room service appropriate? Yes  Diet effective now                EDUCATION NEEDS:   No education needs have been identified at this time  Skin:  Skin Assessment: Reviewed RN Assessment (ecchymosis, MASD, Stage II coccyx)  Last BM:  8/9- type 4  Height:   Ht Readings from Last 1 Encounters:  08/19/19 5\' 4"  (1.626 m)    Weight:   Wt Readings from Last 1 Encounters:   09/02/19 77.2 kg    Ideal Body Weight:  54.5 kg  BMI:  Body mass index is 29.21 kg/m.  Estimated Nutritional Needs:   Kcal:  1700-1900kcal/day  Protein:  80-90g/day  Fluid:  1.4-1.7L/day  Koleen Distance MS, RD, LDN Please refer to [facility] for RD and/or RD on-call/weekend/after hours pager

## 2019-09-03 DIAGNOSIS — F419 Anxiety disorder, unspecified: Secondary | ICD-10-CM | POA: Diagnosis not present

## 2019-09-03 DIAGNOSIS — B9689 Other specified bacterial agents as the cause of diseases classified elsewhere: Secondary | ICD-10-CM | POA: Diagnosis not present

## 2019-09-03 DIAGNOSIS — R5381 Other malaise: Secondary | ICD-10-CM | POA: Diagnosis not present

## 2019-09-03 DIAGNOSIS — F329 Major depressive disorder, single episode, unspecified: Secondary | ICD-10-CM | POA: Diagnosis not present

## 2019-09-03 DIAGNOSIS — Z452 Encounter for adjustment and management of vascular access device: Secondary | ICD-10-CM | POA: Diagnosis not present

## 2019-09-03 DIAGNOSIS — M5136 Other intervertebral disc degeneration, lumbar region: Secondary | ICD-10-CM | POA: Diagnosis not present

## 2019-09-03 DIAGNOSIS — E440 Moderate protein-calorie malnutrition: Secondary | ICD-10-CM | POA: Diagnosis not present

## 2019-09-03 DIAGNOSIS — K5900 Constipation, unspecified: Secondary | ICD-10-CM | POA: Diagnosis not present

## 2019-09-03 DIAGNOSIS — R4182 Altered mental status, unspecified: Secondary | ICD-10-CM | POA: Diagnosis not present

## 2019-09-03 DIAGNOSIS — K219 Gastro-esophageal reflux disease without esophagitis: Secondary | ICD-10-CM | POA: Diagnosis not present

## 2019-09-03 DIAGNOSIS — G2581 Restless legs syndrome: Secondary | ICD-10-CM | POA: Diagnosis not present

## 2019-09-03 DIAGNOSIS — D649 Anemia, unspecified: Secondary | ICD-10-CM | POA: Diagnosis not present

## 2019-09-03 DIAGNOSIS — M545 Low back pain: Secondary | ICD-10-CM | POA: Diagnosis not present

## 2019-09-03 DIAGNOSIS — M4647 Discitis, unspecified, lumbosacral region: Secondary | ICD-10-CM | POA: Diagnosis not present

## 2019-09-03 DIAGNOSIS — E1165 Type 2 diabetes mellitus with hyperglycemia: Secondary | ICD-10-CM | POA: Diagnosis not present

## 2019-09-03 DIAGNOSIS — G4700 Insomnia, unspecified: Secondary | ICD-10-CM | POA: Diagnosis not present

## 2019-09-03 DIAGNOSIS — E039 Hypothyroidism, unspecified: Secondary | ICD-10-CM | POA: Diagnosis not present

## 2019-09-03 DIAGNOSIS — S32040A Wedge compression fracture of fourth lumbar vertebra, initial encounter for closed fracture: Secondary | ICD-10-CM | POA: Diagnosis not present

## 2019-09-03 DIAGNOSIS — G602 Neuropathy in association with hereditary ataxia: Secondary | ICD-10-CM | POA: Diagnosis not present

## 2019-09-03 DIAGNOSIS — R7881 Bacteremia: Secondary | ICD-10-CM | POA: Diagnosis not present

## 2019-09-03 DIAGNOSIS — G4733 Obstructive sleep apnea (adult) (pediatric): Secondary | ICD-10-CM | POA: Diagnosis not present

## 2019-09-03 DIAGNOSIS — M6281 Muscle weakness (generalized): Secondary | ICD-10-CM | POA: Diagnosis not present

## 2019-09-03 DIAGNOSIS — M47816 Spondylosis without myelopathy or radiculopathy, lumbar region: Secondary | ICD-10-CM | POA: Diagnosis not present

## 2019-09-03 DIAGNOSIS — E119 Type 2 diabetes mellitus without complications: Secondary | ICD-10-CM | POA: Diagnosis not present

## 2019-09-03 DIAGNOSIS — B964 Proteus (mirabilis) (morganii) as the cause of diseases classified elsewhere: Secondary | ICD-10-CM | POA: Diagnosis not present

## 2019-09-03 DIAGNOSIS — M4626 Osteomyelitis of vertebra, lumbar region: Secondary | ICD-10-CM | POA: Diagnosis not present

## 2019-09-03 DIAGNOSIS — R531 Weakness: Secondary | ICD-10-CM | POA: Diagnosis not present

## 2019-09-03 DIAGNOSIS — M2550 Pain in unspecified joint: Secondary | ICD-10-CM | POA: Diagnosis not present

## 2019-09-03 DIAGNOSIS — M1990 Unspecified osteoarthritis, unspecified site: Secondary | ICD-10-CM | POA: Diagnosis not present

## 2019-09-03 DIAGNOSIS — R410 Disorientation, unspecified: Secondary | ICD-10-CM | POA: Diagnosis not present

## 2019-09-03 DIAGNOSIS — Z20822 Contact with and (suspected) exposure to covid-19: Secondary | ICD-10-CM | POA: Diagnosis not present

## 2019-09-03 DIAGNOSIS — R3129 Other microscopic hematuria: Secondary | ICD-10-CM | POA: Diagnosis not present

## 2019-09-03 DIAGNOSIS — E559 Vitamin D deficiency, unspecified: Secondary | ICD-10-CM | POA: Diagnosis not present

## 2019-09-03 DIAGNOSIS — Z7984 Long term (current) use of oral hypoglycemic drugs: Secondary | ICD-10-CM | POA: Diagnosis not present

## 2019-09-03 DIAGNOSIS — M4319 Spondylolisthesis, multiple sites in spine: Secondary | ICD-10-CM | POA: Diagnosis not present

## 2019-09-03 DIAGNOSIS — K6812 Psoas muscle abscess: Secondary | ICD-10-CM | POA: Diagnosis not present

## 2019-09-03 DIAGNOSIS — M4317 Spondylolisthesis, lumbosacral region: Secondary | ICD-10-CM | POA: Diagnosis not present

## 2019-09-03 DIAGNOSIS — E872 Acidosis: Secondary | ICD-10-CM | POA: Diagnosis not present

## 2019-09-03 DIAGNOSIS — M4640 Discitis, unspecified, site unspecified: Secondary | ICD-10-CM | POA: Diagnosis not present

## 2019-09-03 DIAGNOSIS — L89152 Pressure ulcer of sacral region, stage 2: Secondary | ICD-10-CM | POA: Diagnosis not present

## 2019-09-03 DIAGNOSIS — N132 Hydronephrosis with renal and ureteral calculous obstruction: Secondary | ICD-10-CM | POA: Diagnosis not present

## 2019-09-03 DIAGNOSIS — G062 Extradural and subdural abscess, unspecified: Secondary | ICD-10-CM | POA: Diagnosis not present

## 2019-09-03 DIAGNOSIS — G20 Parkinson's disease: Secondary | ICD-10-CM | POA: Diagnosis not present

## 2019-09-03 DIAGNOSIS — R338 Other retention of urine: Secondary | ICD-10-CM | POA: Diagnosis not present

## 2019-09-03 DIAGNOSIS — I10 Essential (primary) hypertension: Secondary | ICD-10-CM | POA: Diagnosis not present

## 2019-09-03 DIAGNOSIS — Z9181 History of falling: Secondary | ICD-10-CM | POA: Diagnosis not present

## 2019-09-03 DIAGNOSIS — Z7401 Bed confinement status: Secondary | ICD-10-CM | POA: Diagnosis not present

## 2019-09-03 DIAGNOSIS — N10 Acute tubulo-interstitial nephritis: Secondary | ICD-10-CM | POA: Diagnosis not present

## 2019-09-03 DIAGNOSIS — M4646 Discitis, unspecified, lumbar region: Secondary | ICD-10-CM | POA: Diagnosis not present

## 2019-09-03 DIAGNOSIS — Z8781 Personal history of (healed) traumatic fracture: Secondary | ICD-10-CM | POA: Diagnosis not present

## 2019-09-03 DIAGNOSIS — M6282 Rhabdomyolysis: Secondary | ICD-10-CM | POA: Diagnosis not present

## 2019-09-03 DIAGNOSIS — R279 Unspecified lack of coordination: Secondary | ICD-10-CM | POA: Diagnosis not present

## 2019-09-03 DIAGNOSIS — G061 Intraspinal abscess and granuloma: Secondary | ICD-10-CM | POA: Diagnosis not present

## 2019-09-03 DIAGNOSIS — Z79899 Other long term (current) drug therapy: Secondary | ICD-10-CM | POA: Diagnosis not present

## 2019-09-03 DIAGNOSIS — M5126 Other intervertebral disc displacement, lumbar region: Secondary | ICD-10-CM | POA: Diagnosis not present

## 2019-09-03 DIAGNOSIS — M549 Dorsalgia, unspecified: Secondary | ICD-10-CM | POA: Diagnosis not present

## 2019-09-03 DIAGNOSIS — M4316 Spondylolisthesis, lumbar region: Secondary | ICD-10-CM | POA: Diagnosis not present

## 2019-09-03 DIAGNOSIS — E785 Hyperlipidemia, unspecified: Secondary | ICD-10-CM | POA: Diagnosis not present

## 2019-09-03 DIAGNOSIS — M25511 Pain in right shoulder: Secondary | ICD-10-CM | POA: Diagnosis not present

## 2019-09-03 DIAGNOSIS — M48061 Spinal stenosis, lumbar region without neurogenic claudication: Secondary | ICD-10-CM | POA: Diagnosis not present

## 2019-09-03 DIAGNOSIS — F0280 Dementia in other diseases classified elsewhere without behavioral disturbance: Secondary | ICD-10-CM | POA: Diagnosis not present

## 2019-09-03 LAB — CBC WITH DIFFERENTIAL/PLATELET
Abs Immature Granulocytes: 0.07 10*3/uL (ref 0.00–0.07)
Basophils Absolute: 0.1 10*3/uL (ref 0.0–0.1)
Basophils Relative: 1 %
Eosinophils Absolute: 0.2 10*3/uL (ref 0.0–0.5)
Eosinophils Relative: 2 %
HCT: 32.1 % — ABNORMAL LOW (ref 36.0–46.0)
Hemoglobin: 10.7 g/dL — ABNORMAL LOW (ref 12.0–15.0)
Immature Granulocytes: 1 %
Lymphocytes Relative: 15 %
Lymphs Abs: 1.5 10*3/uL (ref 0.7–4.0)
MCH: 32.4 pg (ref 26.0–34.0)
MCHC: 33.3 g/dL (ref 30.0–36.0)
MCV: 97.3 fL (ref 80.0–100.0)
Monocytes Absolute: 0.6 10*3/uL (ref 0.1–1.0)
Monocytes Relative: 6 %
Neutro Abs: 7.7 10*3/uL (ref 1.7–7.7)
Neutrophils Relative %: 75 %
Platelets: 317 10*3/uL (ref 150–400)
RBC: 3.3 MIL/uL — ABNORMAL LOW (ref 3.87–5.11)
RDW: 15.6 % — ABNORMAL HIGH (ref 11.5–15.5)
WBC: 10.1 10*3/uL (ref 4.0–10.5)
nRBC: 0 % (ref 0.0–0.2)

## 2019-09-03 LAB — COMPREHENSIVE METABOLIC PANEL
ALT: 7 U/L (ref 0–44)
AST: 26 U/L (ref 15–41)
Albumin: 2.3 g/dL — ABNORMAL LOW (ref 3.5–5.0)
Alkaline Phosphatase: 64 U/L (ref 38–126)
Anion gap: 7 (ref 5–15)
BUN: 18 mg/dL (ref 8–23)
CO2: 25 mmol/L (ref 22–32)
Calcium: 8.1 mg/dL — ABNORMAL LOW (ref 8.9–10.3)
Chloride: 107 mmol/L (ref 98–111)
Creatinine, Ser: 0.63 mg/dL (ref 0.44–1.00)
GFR calc Af Amer: >60 mL/min (ref >60)
GFR calc non Af Amer: >60 mL/min (ref >60)
Glucose, Bld: 97 mg/dL (ref 70–99)
Potassium: 4 mmol/L (ref 3.5–5.1)
Sodium: 139 mmol/L (ref 135–145)
Total Bilirubin: 0.5 mg/dL (ref 0.3–1.2)
Total Protein: 6.3 g/dL — ABNORMAL LOW (ref 6.5–8.1)

## 2019-09-03 LAB — FOLATE: Folate: 28 ng/mL (ref >5.9)

## 2019-09-03 LAB — GLUCOSE, CAPILLARY
Glucose-Capillary: 101 mg/dL — ABNORMAL HIGH (ref 70–99)
Glucose-Capillary: 127 mg/dL — ABNORMAL HIGH (ref 70–99)

## 2019-09-03 LAB — C-REACTIVE PROTEIN: CRP: 1.1 mg/dL — ABNORMAL HIGH (ref <1.0)

## 2019-09-03 MED ORDER — LEVOTHYROXINE SODIUM 125 MCG PO TABS: 188.0000 ug | ORAL_TABLET | Freq: Every day | ORAL | Status: DC

## 2019-09-03 NOTE — Care Management Important Message (Signed)
Important Message  Patient Details  Name: Theresa Mills MRN: [id number] Date of Birth: 07-10-1950   Medicare Important Message Given:  Yes     Loann Quill 09/03/2019, 11:21 AM

## 2019-09-03 NOTE — Discharge Summary (Signed)
Physician Discharge Summary  TARYN SHELLHAMMER YOV:[id number] DOB: Feb 06, 1950 DOA: 08/19/2019  PCP: Ronnell Freshwater, NP  Admit date: 08/19/2019 Discharge date: 09/03/2019  Admitted From: Skilled nursing facility Disposition: Skilled nursing facility  Recommendations for Outpatient Follow-up:   1. Please obtain CMP /ESR Lorayne Marek /CBC in one week 2. Please follow up on the following pending results:   Discharge Condition: Fair CODE STATUS: Full code Diet recommendation: Low-carb diet  Discharge summary:  Yun Gutierrez Goochis a 69 y.o.femalewith medical history of DM, GERD, anxiety, OSA, Parkinson's with dementia, hypothyroidism, frequent falls, brought in 08/19/19 by EMS after SNF staff called due to shortness of breath and altered mental status. Patient complained of abdominal pain and neck pain but could not provide additional history due to dementia and altered mental status.  In the ED, patient was lethargic, afebrile, hypotensive with BP 83/41, HR 90, RR 24, O2 sat 93% on room air. Labs were notable for leukocytosis 18k, lactic acidosis 2.5. UA showed pyuria. Creatinine was 2.43 (baseline 0.69), procalcitonin 4.62, troponin 28. CT abdomen pelvis showed distended urinary bladder with bilateral hydronephrosis and findings concerning for pyelonephritis, several nonobstructing left renal calculi. No focal consolidation was seen on CT chest. EKG was normal sinus rhythm. Patient's blood pressure improved with IV fluid bolus. Started on IV antibiotics and admitted to hospitalist service for further evaluation and management. Patient was ultimately found to have lumbar osteomyelitis and discitis, epidural abscess and right psoas abscess.  Assessment & Plan of care:   Lumbar osteomyelitis/discitis/epidural abscess/right psoas abscess, severe sepsis present on admission secondary to acute pyelonephritis and bacteremia: Urine culture, pansensitive Proteus Blood cultures, pansensitive  Proteus MRI of the lumbar spine, abnormal fluid signal intensity within L3-L4, paraspinous edema, aspirate positive for Proteus. Right psoas abscess drain placed by IR on 8/3, left psoas abscess small to drain, positive for Proteus.  -Clinically stabilized. -Right psoas drain removed. -Foley catheter removed and voiding adequate.  Plan: Continue Rocephin as recommended by infectious disease for total 6 weeks of therapy.  Right forearm PICC line present. Rocephin 2 g daily until 9/13.  Need to check CMP, ESR, CRP, CBC every week. Patient will need prolonged antibiotic therapy and placement at a skilled nursing facility. Adequate pain management, scheduled Tylenol and as needed hydrocodone.  Parkinson's disease: On multiple medications that she will continue.  Restless leg syndrome: On Requip that she will continue.  Hypothyroidism: On Synthroid that she will continue.  Previously on 175 mcg Synthroid.  TSH was 13.  Increase to 188 mcg.  Recheck in 1 month.  Chronically sick.  Multiple medical issues.  To be transferred to skilled level of care today.   Discharge Diagnoses:  Principal Problem:   Osteomyelitis of lumbar vertebra (HCC) Active Problems:   Dementia due to Parkinson's disease without behavioral disturbance (HCC)   Diabetes mellitus type 2, uncomplicated ([place])   Essential hypertension   Pressure injury of coccygeal region, stage 2 ([place])   Acquired hypothyroidism   Severe sepsis ([place])   Bilateral hydronephrosis   Acute pyelonephritis   Acute metabolic encephalopathy   Acute urinary retention   AKI (acute kidney injury) ([place])   Malnutrition of moderate degree   Lumbar compression fracture (HCC)   Lumbar discitis    Discharge Instructions  Discharge Instructions    Diet - low sodium heart healthy   Complete by: As directed    Diet Carb Modified   Complete by: As directed    Discharge wound care:   Complete by: As  directed    Barrier dressing on the  buttock   Home infusion instructions   Complete by: As directed    Instructions: Flushing of vascular access device: 0.9% NaCl pre/post medication administration and prn patency; Heparin 100 u/ml, 70m for implanted ports and Heparin 10u/ml, 570mfor all other central venous catheters.   Increase activity slowly   Complete by: As directed      Allergies as of 09/03/2019      Reactions   Codeine Other (See Comments)   HALLUCINATIONS   Ephadrene [cholestatin] Other (See Comments)   HYPER AND NERVOUS   Other Other (See Comments)   HYPER HYPER AND NERVOUS   Valium [diazepam] Other (See Comments)   OVERLY SENSITIVE/TOO STRONG      Medication List    TAKE these medications   ALPRAZolam 0.25 MG tablet Commonly known as: XANAX Take 1 tablet (0.25 mg total) by mouth 2 (two) times daily as needed for anxiety or sleep. What changed:   when to take this  reasons to take this  Another medication with the same name was removed. Continue taking this medication, and follow the directions you see here.   Calcium High Potency/Vitamin D 600-200 MG-UNIT Tabs Generic drug: Calcium Carbonate-Vitamin D Take 1 tablet by mouth daily.   carbidopa-levodopa 50-200 MG tablet Commonly known as: SINEMET CR Take 1 tablet by mouth 2 (two) times daily.   carbidopa-levodopa 25-100 MG tablet Commonly known as: SINEMET IR Take 2 tablets by mouth 4 (four) times daily.   cefTRIAXone  IVPB Commonly known as: ROCEPHIN Inject 2 g into the vein daily. Indication:  Proteus bacteremia, discitis, psoas abscess, UTI Last Day of Therapy: 10/06/2019 Labs - Once weekly:  CBC/D, CMP, CRP, ESR   celecoxib 200 MG capsule Commonly known as: CELEBREX Take 1 capsule (200 mg total) by mouth 2 (two) times daily as needed.   citalopram 40 MG tablet Commonly known as: CELEXA Take 40 mg by mouth daily.   docusate sodium 100 MG capsule Commonly known as: COLACE Take 1 capsule (100 mg total) by mouth 2 (two) times  daily.   HYDROcodone-acetaminophen 5-325 MG tablet Commonly known as: NORCO/VICODIN Take 1 tablet by mouth every 6 (six) hours as needed for up to 5 days for moderate pain or severe pain.   levothyroxine 125 MCG tablet Commonly known as: SYNTHROID Take 1.5 tablets (188 mcg total) by mouth daily at 6 (six) AM. Start taking on: September 04, 2019 What changed:   medication strength  how much to take  when to take this   melatonin 3 MG Tabs tablet Take by mouth.   metFORMIN 500 MG tablet Commonly known as: GLUCOPHAGE Take 500 mg by mouth 2 (two) times daily with a meal.   mirtazapine 7.5 MG tablet Commonly known as: REMERON Take 1 tablet (7.5 mg total) by mouth at bedtime.   MULTIVITAMIN WOMEN 50+ PO Take 1 tablet by mouth daily.   omeprazole 40 MG capsule Commonly known as: PRILOSEC Take 1 capsule (40 mg total) by mouth daily.   ondansetron 4 MG disintegrating tablet Commonly known as: ZOFRAN-ODT Take 1 tablet (4 mg total) by mouth every 8 (eight) hours as needed for nausea or vomiting.   OneTouch Delica Lancets 3367Hisc TEST BLOOD SUGAR BID   OneTouch Verio test strip Generic drug: glucose blood TEST BLOOD SUGAR BID   rOPINIRole 2 MG 24 hr tablet Commonly known as: REQUIP XL Take 2 mg by mouth at bedtime.   sertraline 50  MG tablet Commonly known as: ZOLOFT Take 1 tablet (50 mg total) by mouth daily.   simvastatin 40 MG tablet Commonly known as: ZOCOR Take 1 tablet (40 mg total) by mouth at bedtime.            Home Infusion Instuctions  (From admission, onward)         Start     Ordered   09/02/19 0000  Home infusion instructions       Question:  Instructions  Answer:  Flushing of vascular access device: 0.9% NaCl pre/post medication administration and prn patency; Heparin 100 u/ml, 66m for implanted ports and Heparin 10u/ml, 520mfor all other central venous catheters.   09/02/19 1148           Discharge Care Instructions  (From admission,  onward)         Start     Ordered   09/03/19 0000  Discharge wound care:       Comments: Barrier dressing on the buttock   09/03/19 0853          Contact information for after-discharge care    [email or web address] .   Service: Skilled Nursing Contact information: [phone]               Allergies  Allergen Reactions  . Codeine Other (See Comments)    HALLUCINATIONS  . Ephadrene [Cholestatin] Other (See Comments)    HYPER AND NERVOUS  . Other Other (See Comments)    HYPER  HYPER AND NERVOUS  . Valium [Diazepam] Other (See Comments)    OVERLY SENSITIVE/TOO STRONG    Consultations:  Infectious disease  Interventional radiology  Neurosurgery   Procedures/Studies: CT ABDOMEN PELVIS WO CONTRAST  Result Date: 08/19/2019 CLINICAL DATA:  [age]ear old female with abdominal pain. Concern for bowel obstruction. EXAM: CT CHEST, ABDOMEN AND PELVIS WITHOUT CONTRAST TECHNIQUE: Multidetector CT imaging of the chest, abdomen and pelvis was performed following the standard protocol without IV contrast. COMPARISON:  Chest radiograph dated 08/19/2019. FINDINGS: Evaluation of this exam is limited in the absence of intravenous contrast. CT CHEST FINDINGS Cardiovascular: There is no cardiomegaly or pericardial effusion. Coronary vascular calcification of the LAD. Mild atherosclerotic calcification of the thoracic aorta. The central pulmonary arteries are grossly unremarkable on this noncontrast CT. Mediastinum/Nodes: There is no hilar or mediastinal adenopathy. The esophagus and the thyroid gland are grossly unremarkable as visualized. No mediastinal fluid collection. Lungs/Pleura: Mild pleural thickening versus minimal right pleural effusion. Right lung base linear atelectasis/scarring. No focal consolidation, or pneumothorax. The central airways are patent. Musculoskeletal: Osteopenia with  scoliosis and degenerative changes of the spine. Old healed left posterior rib fractures. No acute osseous pathology. CT ABDOMEN PELVIS FINDINGS No intra-abdominal free air or free fluid. Hepatobiliary: Several small hepatic hypodense lesions are not characterized on this noncontrast CT. Cholecystectomy. Pancreas: The pancreas is grossly unremarkable. Spleen: Normal in size without focal abnormality. Adrenals/Urinary Tract: The adrenal glands are unremarkable. Mild bilateral hydronephrosis, left greater right. Several nonobstructing left renal calculi measure up to 4 mm. There is left perinephric stranding concerning for pyelonephritis. Correlation with urinalysis recommended. The urinary bladder is distended. There is haziness of the bladder wall with perivesical stranding. Stomach/Bowel: There is no bowel obstruction or active inflammation. The appendix is normal. Vascular/Lymphatic: Moderate aortoiliac atherosclerotic disease. The IVC is grossly unremarkable. No portal venous gas. There is no adenopathy. Reproductive: Hysterectomy. Other: Mild diffuse subcutaneous edema.  No fluid collection. Musculoskeletal: There is severe osteopenia, scoliosis, and degenerative changes of the spine and hips. There are compression fractures of the superior endplate of L4 and inferior endplate of L3, likely acute. Clinical correlation is recommended. There is approximately 25% loss of vertebral body height at L4. There is disc desiccation at L2-L3 and L3-L4. Bilateral L5 pars defects with grade 1 L5-S1 anterolisthesis. IMPRESSION: 1. Compression fractures of the superior endplate of L4 and inferior endplate of L3, likely acute. Clinical correlation is recommended. 2. Distended urinary bladder with mild bilateral hydronephrosis and findings concerning for pyelonephritis. Correlation with urinalysis recommended. 3. Several nonobstructing left renal calculi.  No obstructing stone. 4. No bowel obstruction. Normal appendix. 5. Aortic  Atherosclerosis (ICD10-I70.0). Electronically Signed   By: Anner Crete M.D.   On: 08/19/2019 20:15   DG Pelvis 1-2 Views  Result Date: 08/05/2019 CLINICAL DATA:  Left hip pain after multiple falls. EXAM: PELVIS - 1-2 VIEW COMPARISON:  Jun 10, 2017. FINDINGS: There is no evidence of pelvic fracture or diastasis. No pelvic bone lesions are seen. Mild-to-moderate degenerative changes seen involving both hip joints. IMPRESSION: Mild to moderate degenerative joint disease of both hip joints. No acute abnormality seen in the pelvis. Electronically Signed   By: Marijo Conception M.D.   On: 08/05/2019 09:54   CT Head Wo Contrast  Result Date: 08/19/2019 CLINICAL DATA:  Altered mental status EXAM: CT HEAD WITHOUT CONTRAST CT CERVICAL SPINE WITHOUT CONTRAST TECHNIQUE: Multidetector CT imaging of the head and cervical spine was performed following the standard protocol without intravenous contrast. Multiplanar CT image reconstructions of the cervical spine were also generated. COMPARISON:  CT brain and cervical spine 08/05/2019 FINDINGS: CT HEAD FINDINGS Brain: No acute territorial infarction, hemorrhage or intracranial mass. Atrophy. Mild chronic small vessel ischemic change of the white matter. Stable ventricle size. Vascular: No hyperdense vessels. Scattered carotid vascular calcification. Skull: Normal. Negative for fracture or focal lesion. Sinuses/Orbits: No acute finding. Other: None CT CERVICAL SPINE FINDINGS Alignment: No subluxation.  Facet alignment is maintained. Skull base and vertebrae: No acute fracture. No primary bone lesion or focal pathologic process. Soft tissues and spinal canal: No prevertebral fluid or swelling. No visible canal hematoma. Disc levels: Mild degenerative changes at C5-C6, C6-C7 and C7-T1. Facet degenerative changes at multiple levels. Upper chest: Negative. Other: None IMPRESSION: 1. No CT evidence for acute intracranial abnormality. Atrophy and mild chronic small vessel  ischemic change of the white matter. 2. No acute osseous abnormality of the cervical spine Electronically Signed   By: Donavan Foil M.D.   On: 08/19/2019 20:28   CT Head Wo Contrast  Result Date: 08/05/2019 CLINICAL DATA:  Several recent falls EXAM: CT HEAD WITHOUT CONTRAST CT CERVICAL SPINE WITHOUT CONTRAST TECHNIQUE: Multidetector CT imaging of the head and cervical spine was performed following the standard protocol without intravenous contrast. Multiplanar CT image reconstructions of the cervical spine were also generated. COMPARISON:  CT head and CT cervical spine Jun 10, 2017 FINDINGS: CT HEAD FINDINGS Brain: Moderate diffuse atrophy is stable. There is no intracranial mass, hemorrhage, extra-axial fluid collection, or midline shift. There is rather minimal small vessel disease in the centra semiovale bilaterally. No evident acute infarct. Vascular: No hyperdense vessel. There are foci of calcification in each carotid siphon. Skull: The bony calvarium appears intact. Sinuses/Orbits: There is an apparent retention cyst in the inferior left maxillary antrum. There is mucosal thickening in several ethmoid air cells. Other visualized paranasal sinuses are clear. Orbits appear symmetric  bilaterally. There is rightward nasal septal deviation. Other: Mastoid air cells are clear. CT CERVICAL SPINE FINDINGS Alignment: There is no spondylolisthesis. Skull base and vertebrae: The skull base and craniocervical junction regions appear normal. There is no demonstrable fracture. No blastic or lytic bone lesions. Soft tissues and spinal canal: Prevertebral soft tissues are normal. There is narrowing of the predental space with osteoarthritic change in this area. There is no evident cord or canal hematoma. No paraspinous lesions are evident. Disc levels: There is mild disc space narrowing at C6-7 and C7-T1. Other disc spaces appear unremarkable. There is facet hypertrophy at multiple levels. No disc extrusion or stenosis  evident. Upper chest: 6 visualized upper lung regions are clear. Other: There are foci of calcification in left carotid artery. IMPRESSION: CT head: Atrophy with slight periventricular small vessel disease. No acute infarct. No mass or hemorrhage. There are foci of arterial vascular calcification. There are foci of paranasal sinus disease. There is nasal septal deviation. CT cervical spine: No fracture or spondylolisthesis. Osteoarthritic change noted at multiple levels. No disc extrusion or stenosis. There is left carotid artery calcification. Electronically Signed   By: Lowella Grip III M.D.   On: 08/05/2019 10:29   CT Chest Wo Contrast  Result Date: 08/19/2019 CLINICAL DATA:  69 year old female with abdominal pain. Concern for bowel obstruction. EXAM: CT CHEST, ABDOMEN AND PELVIS WITHOUT CONTRAST TECHNIQUE: Multidetector CT imaging of the chest, abdomen and pelvis was performed following the standard protocol without IV contrast. COMPARISON:  Chest radiograph dated 08/19/2019. FINDINGS: Evaluation of this exam is limited in the absence of intravenous contrast. CT CHEST FINDINGS Cardiovascular: There is no cardiomegaly or pericardial effusion. Coronary vascular calcification of the LAD. Mild atherosclerotic calcification of the thoracic aorta. The central pulmonary arteries are grossly unremarkable on this noncontrast CT. Mediastinum/Nodes: There is no hilar or mediastinal adenopathy. The esophagus and the thyroid gland are grossly unremarkable as visualized. No mediastinal fluid collection. Lungs/Pleura: Mild pleural thickening versus minimal right pleural effusion. Right lung base linear atelectasis/scarring. No focal consolidation, or pneumothorax. The central airways are patent. Musculoskeletal: Osteopenia with scoliosis and degenerative changes of the spine. Old healed left posterior rib fractures. No acute osseous pathology. CT ABDOMEN PELVIS FINDINGS No intra-abdominal free air or free fluid.  Hepatobiliary: Several small hepatic hypodense lesions are not characterized on this noncontrast CT. Cholecystectomy. Pancreas: The pancreas is grossly unremarkable. Spleen: Normal in size without focal abnormality. Adrenals/Urinary Tract: The adrenal glands are unremarkable. Mild bilateral hydronephrosis, left greater right. Several nonobstructing left renal calculi measure up to 4 mm. There is left perinephric stranding concerning for pyelonephritis. Correlation with urinalysis recommended. The urinary bladder is distended. There is haziness of the bladder wall with perivesical stranding. Stomach/Bowel: There is no bowel obstruction or active inflammation. The appendix is normal. Vascular/Lymphatic: Moderate aortoiliac atherosclerotic disease. The IVC is grossly unremarkable. No portal venous gas. There is no adenopathy. Reproductive: Hysterectomy. Other: Mild diffuse subcutaneous edema. No fluid collection. Musculoskeletal: There is severe osteopenia, scoliosis, and degenerative changes of the spine and hips. There are compression fractures of the superior endplate of L4 and inferior endplate of L3, likely acute. Clinical correlation is recommended. There is approximately 25% loss of vertebral body height at L4. There is disc desiccation at L2-L3 and L3-L4. Bilateral L5 pars defects with grade 1 L5-S1 anterolisthesis. IMPRESSION: 1. Compression fractures of the superior endplate of L4 and inferior endplate of L3, likely acute. Clinical correlation is recommended. 2. Distended urinary bladder with mild bilateral hydronephrosis and  findings concerning for pyelonephritis. Correlation with urinalysis recommended. 3. Several nonobstructing left renal calculi.  No obstructing stone. 4. No bowel obstruction. Normal appendix. 5. Aortic Atherosclerosis (ICD10-I70.0). Electronically Signed   By: Anner Crete M.D.   On: 08/19/2019 20:15   CT Cervical Spine Wo Contrast  Result Date: 08/19/2019 CLINICAL DATA:  Altered  mental status EXAM: CT HEAD WITHOUT CONTRAST CT CERVICAL SPINE WITHOUT CONTRAST TECHNIQUE: Multidetector CT imaging of the head and cervical spine was performed following the standard protocol without intravenous contrast. Multiplanar CT image reconstructions of the cervical spine were also generated. COMPARISON:  CT brain and cervical spine 08/05/2019 FINDINGS: CT HEAD FINDINGS Brain: No acute territorial infarction, hemorrhage or intracranial mass. Atrophy. Mild chronic small vessel ischemic change of the white matter. Stable ventricle size. Vascular: No hyperdense vessels. Scattered carotid vascular calcification. Skull: Normal. Negative for fracture or focal lesion. Sinuses/Orbits: No acute finding. Other: None CT CERVICAL SPINE FINDINGS Alignment: No subluxation.  Facet alignment is maintained. Skull base and vertebrae: No acute fracture. No primary bone lesion or focal pathologic process. Soft tissues and spinal canal: No prevertebral fluid or swelling. No visible canal hematoma. Disc levels: Mild degenerative changes at C5-C6, C6-C7 and C7-T1. Facet degenerative changes at multiple levels. Upper chest: Negative. Other: None IMPRESSION: 1. No CT evidence for acute intracranial abnormality. Atrophy and mild chronic small vessel ischemic change of the white matter. 2. No acute osseous abnormality of the cervical spine Electronically Signed   By: Donavan Foil M.D.   On: 08/19/2019 20:28   CT Cervical Spine Wo Contrast  Result Date: 08/05/2019 CLINICAL DATA:  Several recent falls EXAM: CT HEAD WITHOUT CONTRAST CT CERVICAL SPINE WITHOUT CONTRAST TECHNIQUE: Multidetector CT imaging of the head and cervical spine was performed following the standard protocol without intravenous contrast. Multiplanar CT image reconstructions of the cervical spine were also generated. COMPARISON:  CT head and CT cervical spine Jun 10, 2017 FINDINGS: CT HEAD FINDINGS Brain: Moderate diffuse atrophy is stable. There is no  intracranial mass, hemorrhage, extra-axial fluid collection, or midline shift. There is rather minimal small vessel disease in the centra semiovale bilaterally. No evident acute infarct. Vascular: No hyperdense vessel. There are foci of calcification in each carotid siphon. Skull: The bony calvarium appears intact. Sinuses/Orbits: There is an apparent retention cyst in the inferior left maxillary antrum. There is mucosal thickening in several ethmoid air cells. Other visualized paranasal sinuses are clear. Orbits appear symmetric bilaterally. There is rightward nasal septal deviation. Other: Mastoid air cells are clear. CT CERVICAL SPINE FINDINGS Alignment: There is no spondylolisthesis. Skull base and vertebrae: The skull base and craniocervical junction regions appear normal. There is no demonstrable fracture. No blastic or lytic bone lesions. Soft tissues and spinal canal: Prevertebral soft tissues are normal. There is narrowing of the predental space with osteoarthritic change in this area. There is no evident cord or canal hematoma. No paraspinous lesions are evident. Disc levels: There is mild disc space narrowing at C6-7 and C7-T1. Other disc spaces appear unremarkable. There is facet hypertrophy at multiple levels. No disc extrusion or stenosis evident. Upper chest: 6 visualized upper lung regions are clear. Other: There are foci of calcification in left carotid artery. IMPRESSION: CT head: Atrophy with slight periventricular small vessel disease. No acute infarct. No mass or hemorrhage. There are foci of arterial vascular calcification. There are foci of paranasal sinus disease. There is nasal septal deviation. CT cervical spine: No fracture or spondylolisthesis. Osteoarthritic change noted at multiple levels.  No disc extrusion or stenosis. There is left carotid artery calcification. Electronically Signed   By: Lowella Grip III M.D.   On: 08/05/2019 10:29   MR LUMBAR SPINE WO CONTRAST  Result Date:  08/24/2019 CLINICAL DATA:  Initial evaluation for compression fracture. EXAM: MRI LUMBAR SPINE WITHOUT CONTRAST TECHNIQUE: Multiplanar, multisequence MR imaging of the lumbar spine was performed. No intravenous contrast was administered. COMPARISON:  Prior CT from 08/19/2019. FINDINGS: Segmentation: Standard. Lowest well-formed disc space labeled the L5-S1 level. Alignment: Moderate levoscoliosis with apex at L2. Chronic bilateral pars defects at L5 with associated 8 mm spondylolisthesis of L5 on S1. Trace 2 mm retrolisthesis of L1 on L2 and L2 on L3. Vertebrae: There is abnormal fluid signal intensity within the L3-4 interspace with associated marrow edema within the adjacent endplates of L3 and L4. Associated paraspinous edema within the adjacent psoas musculature. Findings concerning for possible osteomyelitis discitis. Previously identified superimposed compression fracture at the superior endplate of L4 again seen, associated height loss measures up to 40% with 3 mm bony retropulsion. Additional acute to subacute compression fracture extending through the inferior endplate of L3 with mild 25% central height loss and trace 2 mm bony retropulsion. Mild height loss at the superior endplate of O87 is chronic in appearance. Vertebral body height otherwise maintained. Additionally, there is abnormal marrow edema about the right greater than left L3-4 facets with associated small joint effusions and adjacent paraspinous edema. While these findings could be degenerative, associated septic arthritis could also be present. Evidence for epidural collection involving the ventral and right posterolateral epidural space at the level of L3-4, most likely epidural abscess (series 15, images 24, 27). Additionally, fluid signal intensity seen within the L2-3 interspace, favored to be degenerative, although possible discitis at this level not excluded. Additional more mild fluid signal intensity noted superiorly within the L1-2  interspace as well, favored to be degenerative. Underlying bone marrow signal intensity otherwise within normal limits. Multiple scattered benign hemangiomata noted. No worrisome osseous lesions. Conus medullaris and cauda equina: Conus extends to the L2 level. Conus and cauda equina appear normal. Paraspinal and other soft tissues: Abnormal edema seen within the right greater than left psoas musculature bilaterally. Superimposed loculated collection within the right psoas muscle measures 1.7 x 1.7 x 4.9 cm (series 15, image 31), most likely reflecting abscess. Scattered fat stranding noted within the adjacent retroperitoneum. Soft tissue edema also noted within the posterior paraspinous musculature, most pronounced about the L3-4 facets. Partially visualized visceral structures grossly unremarkable. Disc levels: T12-L1: Mild left eccentric disc bulge. Superimposed right extraforaminal disc protrusion (series 15, image 8). Mild facet hypertrophy. No significant stenosis. L1-2: Diffuse disc bulge with disc desiccation and intervertebral disc space narrowing. Associated reactive endplate changes with marginal endplate spurring superimposed more focal right extraforaminal disc osteophyte complex (series 15, image 15). Mild to moderate facet hypertrophy, greater on the left. No significant spinal stenosis. Foramina remain patent. L2-3: Advanced intervertebral disc space narrowing with diffuse disc bulge and disc desiccation. Disc bulging asymmetric to the right with exuberant right-sided reactive endplate spurring. Fluid signal intensity within the central and right aspect of the disc space favored to be degenerative, although changes related to discitis not entirely excluded. Moderate bilateral facet hypertrophy. Small amount of epidural abscess seen at the right posterior epidural space, tracking inferiorly from the L3-4 level (series 15, image 22). Resultant moderate spinal stenosis with right greater than left  lateral recess narrowing. Mild bilateral L2 foraminal stenosis. L3-4: Findings concerning for  osteomyelitis discitis with adjacent endplate compression fractures. Superimposed mild disc bulge and/or epidural abscess within the ventral epidural space. Epidural abscess extends to involve the right posterior aspect of the thecal sac. Superimposed moderate facet hypertrophy with associated joint effusions and probable septic arthritis. Resultant moderate to severe spinal stenosis. Thecal sac measures 8 mm in AP diameter at its most narrow point. Moderate right greater than left L3 foraminal narrowing. L4-5: Disc desiccation with minimal disc bulge. Superimposed shallow left extraforaminal disc protrusion. Moderate facet hypertrophy. No spinal stenosis. Foramina remain patent. L5-S1: Chronic bilateral pars defects with associated 8 mm spondylolisthesis. Moderate right worse than left facet hypertrophy. No spinal stenosis. Mild right with moderate to severe left L5 foraminal stenosis. IMPRESSION: 1. Findings concerning for osteomyelitis discitis involving the L3-4 interspace as above. Associated epidural abscess at the L3-4 level with resultant moderate to severe spinal stenosis. 2. Abnormal edema with joint effusions involving the bilateral L3-4 facets, concerning for concomitant septic arthritis. 3. Associated paraspinous inflammation centered about the L3-4 interspace with superimposed 1.7 x 1.7 x 4.9 cm collection within the right psoas muscle, likely abscess. 4. Superimposed acute to subacute compression deformities involving the inferior endplate of G9-92% height loss with 2 mm bony retropulsion, and superior endplate of E2-68% height loss with 3 mm bony retropulsion. 5. Additional fluid signal intensity within the L1-2 and L2-3 interspaces, favored to be degenerative, although additional levels of discitis somewhat difficult to exclude. Attention at follow-up recommended. 6. Chronic bilateral pars defects at L5-S1  with associated 8 mm spondylolisthesis of L5 on S1 with moderate to severe left L5 foraminal stenosis. 7. Underlying moderate levoscoliosis with additional moderate multilevel degenerative spondylosis elsewhere as above. Results were called by telephone at the time of interpretation on 08/24/2019 at 10:49 pm to provider Sharion Settler, NP, who verbally acknowledged these results. Electronically Signed   By: Jeannine Boga M.D.   On: 08/24/2019 22:51   MR Lumbar Spine W Wo Contrast  Result Date: 08/28/2019 CLINICAL DATA:  Lumbar discitis. Percutaneous drainage of left iliopsoas abscess. EXAM: MRI LUMBAR SPINE WITHOUT AND WITH CONTRAST TECHNIQUE: Multiplanar and multiecho pulse sequences of the lumbar spine were obtained without and with intravenous contrast. CONTRAST:  70m GADAVIST GADOBUTROL 1 MMOL/ML IV SOLN COMPARISON:  MRI lumbar spine without contrast 08/24/2019 FINDINGS: Segmentation:  Normal Alignment:  Lumbar levoscoliosis. Mild retrolisthesis L1-2, L2-3. 12 mm anterolisthesis L5-S1 is unchanged. Vertebrae: Superior endplate fracture of TT41with mild edema. No significant enhancement. Probable healing fracture. Adjacent large hemangioma T12 vertebral body on the left. Small hemangioma L1 vertebral body. Abnormal disc space at L3-4 with inferior endplate depression of L3 unchanged. Moderate depression of the superior endplate of L4 has progressed in the interval. There is edema in the disc without significant disc fluid or enhancement. There is bone marrow edema surrounding the L3-4 disc space. There is a large right facet joint effusion with enhancement of the facet joint and surrounding soft tissue suggestive of infection. Right facet joint effusion has progressed in the interval. Conus medullaris and cauda equina: Conus extends to the L2-3 level. Conus and cauda equina appear normal. Paraspinal and other soft tissues: Bilateral psoas edema most consistent with myositis. Right psoas fluid collection  has resolved following percutaneous drainage. Small loculated fluid collection in the left psoas muscle has progressed in the interval likely abscess. Posterior epidural fluid collection at the L3-4 level is likely an abscess and unchanged. Disc levels: T12-L1: Negative for stenosis L1-2: Asymmetric disc degeneration on the right with  spurring and mild subarticular stenosis on the right L2-3: Advanced disc degeneration asymmetric on the right with disc space narrowing and spurring. Moderate to advanced facet degeneration bilaterally. Moderate to severe subarticular and foraminal stenosis on the right. Mild to moderate subarticular stenosis on the left and mild spinal stenosis. L3-4: Disc degeneration with disc bulging and central disc protrusion unchanged. There is no discal enhancement or discal fluid which typically is seen with disc infection. However there is considerable surrounding soft tissue swelling and bilateral psoas abscess compatible with infection. There is progressive right facet joint effusion with enhancement of the facet joint and surrounding soft tissue suggestive of infection. There is a posterior epidural fluid collection on the right measuring approximately 6 x 15 mm which is unchanged. Probable abscess. Mild spinal stenosis has improved in the interval. L4-5: Mild facet degeneration bilaterally.  No significant stenosis L5-S1: Chronic bilateral pars defects of L5 with 12 mm anterolisthesis. There is compression of the left L5 nerve root in the foramen unchanged from the prior study. Right foramen patent. IMPRESSION: 1. Fracture of the inferior endplate of L3 is unchanged. Fracture of the superior endplate of L4 shows mild interval progression. 2. 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 Progression of left psoas abscess and improvement in right psoas abscess since prior drainage. There is a posterior epidural fluid collection on the  right at L3-4 compatible with abscess. This is likely rising from infected right facet joint. In addition, there is probable infection in the L3-4 disc space. 3. Scoliosis and degenerative changes throughout the lumbar spine as above. Electronically Signed   By: Franchot Gallo M.D.   On: 08/28/2019 15:00   CT ABDOMEN PELVIS W CONTRAST  Result Date: 09/02/2019 CLINICAL DATA:  Status post percutaneous catheter drainage right iliopsoas intramuscular abscess 08/25/2019 with minimal fluid return from the drain currently. EXAM: CT ABDOMEN AND PELVIS WITH CONTRAST TECHNIQUE: Multidetector CT imaging of the abdomen and pelvis was performed using the standard protocol following bolus administration of intravenous contrast. CONTRAST:  163m OMNIPAQUE IOHEXOL 300 MG/ML  SOLN COMPARISON:  08/25/2019 FINDINGS: Lower chest: No acute abnormality. Hepatobiliary: No focal liver abnormality is seen. Stable hepatic cysts. Status post cholecystectomy. No biliary dilatation. Pancreas: Unremarkable. No pancreatic ductal dilatation or surrounding inflammatory changes. Spleen: Normal in size without focal abnormality. Adrenals/Urinary Tract: Adrenal glands are unremarkable. Kidneys are normal, without renal calculi, focal lesion, or hydronephrosis. Bladder is unremarkable. Stomach/Bowel: Bowel shows no evidence of obstruction, ileus, inflammation or lesion. No free air identified. Vascular/Lymphatic: Calcified plaque in the abdominal aorta without evidence of aneurysm. No enlarged lymph nodes identified. Reproductive: Status post hysterectomy. No adnexal masses. Other: Percutaneous drain remains present within the right iliopsoas muscle at roughly the L4 level. No residual fluid collection identified. No additional fluid collections seen. Small umbilical hernia contains fat. Musculoskeletal: No acute or significant osseous findings. IMPRESSION: Resolution of right iliopsoas abscess after percutaneous drain placement. Aortic  Atherosclerosis (ICD10-I70.0). Electronically Signed   By: GAletta EdouardM.D.   On: 09/02/2019 14:36   CT BIOPSY  Result Date: 08/25/2019 INDICATION: Right-sided iliopsoas abscess. Please perform CT-guided aspiration and/or drainage catheter placement for diagnostic and therapeutic purposes. EXAM: CT-GUIDED RIGHT ILIOPSOAS ABSCESS DRAINAGE CATHETER PLACEMENT COMPARISON:  Lumbar spine MRI - 08/24/2019 MEDICATIONS: The patient is currently admitted to the hospital and receiving intravenous antibiotics. The antibiotics were administered within an appropriate time frame prior to the initiation of the procedure. ANESTHESIA/SEDATION: Moderate (conscious) sedation was  employed during this procedure. A total of Versed 0.5 mg and Fentanyl 25 mcg was administered intravenously. Moderate Sedation Time: 15 minutes. The patient's level of consciousness and vital signs were monitored continuously by radiology nursing throughout the procedure under my direct supervision. CONTRAST:  None COMPLICATIONS: None immediate. PROCEDURE: Informed written consent was obtained from the patient after a discussion of the risks, benefits and alternatives to treatment. The patient was placed left lateral decubitus on the CT gantry and a pre procedural CT was performed re-demonstrating the known ill-defined abscess/fluid collection within the right iliopsoas musculature with dominant component measuring approximately 2.1 x 1.7 cm (image 28, series 2). The procedure was planned. A timeout was performed prior to the initiation of the procedure. The skin overlying the posterior aspect the right lower back was prepped and draped in the usual sterile fashion. The overlying soft tissues were anesthetized with 1% lidocaine with epinephrine. Appropriate trajectory was planned with the use of a 22 gauge spinal needle. An 18 gauge trocar needle was advanced into the abscess/fluid collection and a short Amplatz super stiff wire was coiled within the  collection. Appropriate positioning was confirmed with a limited CT scan. The tract was serially dilated allowing placement of a 10 [place] all-purpose drainage catheter. Appropriate positioning was confirmed with a limited postprocedural CT scan. Approximately 10 ml of bloody, purulent fluid was aspirated. The tube was connected to a JP bulb and sutured in place. A dressing was placed. The patient tolerated the procedure well without immediate post procedural complication. IMPRESSION: Successful CT guided placement of a 10 French all purpose drain catheter into the right iliopsoas abscess with aspiration of 10 mL of bloody, purulent fluid. Samples were sent to the laboratory as requested by the ordering clinical team. PLAN: - flush the percutaneous drainage catheter with 10 cc normal saline b.i.d. - Maintain diligent records regarding daily drainage catheter output (excluding flush volumes). - Once drainage catheter output is less than 10 cc per day the drainage catheter may be removed with or without obtaining contrast-enhanced lumbar spine CT prior to removal. Note, drainage catheter does NOT require injection prior to removal. Electronically Signed   By: Sandi Mariscal M.D.   On: 08/25/2019 14:16   DG Chest Port 1 View  Result Date: 08/19/2019 CLINICAL DATA:  Change in mental status EXAM: PORTABLE CHEST 1 VIEW COMPARISON:  Jun 10, 2017 FINDINGS: The heart size and mediastinal contours are within normal limits. Aortic knob calcifications are seen. There appears to be a 2 cm rounded nodule seen within the right perihilar region. No large airspace consolidation seen within the left lung. No pleural effusion. No acute osseous abnormality. IMPRESSION: 2 cm rounded nodule within the right perihilar region not clearly identified on the prior exam. Would recommend dedicated nonemergent chest CT for further evaluation. Electronically Signed   By: Prudencio Pair M.D.   On: 08/19/2019 19:21   DG Knee Complete 4 Views  Left  Result Date: 08/06/2019 CLINICAL DATA:  Left knee pain after fall. EXAM: LEFT KNEE - COMPLETE 4+ VIEW COMPARISON:  None. FINDINGS: No evidence of fracture or dislocation. Moderate to advanced tricompartmental peripheral spurring. There is mild medial tibiofemoral joint space narrowing. Degenerative spurring of the tibial spines. There is a small to moderate joint effusion. Small quadriceps tendon enthesophyte. Soft tissues are unremarkable. IMPRESSION: 1. No acute fracture or subluxation of the left knee. 2. Moderate to advanced tricompartmental osteoarthritis. Small to moderate joint effusion. Electronically Signed   By: Aurther Loft.D.  On: 08/06/2019 17:20   DG Knee Complete 4 Views Right  Result Date: 08/05/2019 CLINICAL DATA:  Right knee pain after fall. EXAM: RIGHT KNEE - COMPLETE 4+ VIEW COMPARISON:  Jun 10, 2017. FINDINGS: No evidence of fracture, dislocation, or joint effusion. Severe narrowing of lateral joint space is noted with osteophyte formation. Moderate narrowing of patellofemoral space is noted with osteophyte formation. Soft tissues are unremarkable. IMPRESSION: Severe degenerative joint disease. No acute abnormality seen in the right knee. Electronically Signed   By: Marijo Conception M.D.   On: 08/05/2019 09:49   DG Foot Complete Left  Result Date: 08/05/2019 CLINICAL DATA:  Acute left foot pain after multiple falls. EXAM: LEFT FOOT - COMPLETE 3+ VIEW COMPARISON:  April 05, 2015. FINDINGS: Mildly displaced fracture is seen involving the proximal base of the fourth proximal phalanx. Mild posterior calcaneal spurring is noted. Mild hallux valgus deformity is seen involving the first metatarsophalangeal joint. IMPRESSION: Mildly displaced fourth proximal phalangeal fracture. Electronically Signed   By: Marijo Conception M.D.   On: 08/05/2019 09:52   ECHOCARDIOGRAM COMPLETE  Result Date: 08/28/2019    ECHOCARDIOGRAM REPORT   Patient Name:   DONYALE FALCON [facility] Date of Exam: 08/27/2019  Medical Rec #:  [id number]      Height:       64.0 in Accession #:    [id number]     Weight:       160.1 lb Date of Birth:  [birth year]     BSA:          1.780 m Patient Age:    69 years       BP:           113/59 mmHg Patient Gender: F              HR:           81 bpm. Exam Location:  [facility] Procedure: 2D Echo, Cardiac Doppler and Color Doppler Indications:     Sepsis  History:         Patient has no prior history of Echocardiogram examinations.                  Risk Factors:Hypertension, Diabetes and Dyslipidemia. Anxiety.                  Thyroid disease. Sleep apnea. Parkinson's disease.  Sonographer:     Wilford Sports Rodgers-Jones Referring Phys:  Sedro-Woolley Diagnosing Phys: Bartholome Bill MD IMPRESSIONS  1. Left ventricular ejection fraction, by estimation, is 60 to 65%. The left ventricle has normal function. The left ventricle has no regional wall motion abnormalities. Left ventricular diastolic parameters were normal.  2. Right ventricular systolic function is normal. The right ventricular size is normal. There is normal pulmonary artery systolic pressure.  3. The mitral valve is grossly normal. No evidence of mitral valve regurgitation.  4. The aortic valve is grossly normal. Aortic valve regurgitation is not visualized. FINDINGS  Left Ventricle: Left ventricular ejection fraction, by estimation, is 60 to 65%. The left ventricle has normal function. The left ventricle has no regional wall motion abnormalities. The left ventricular internal cavity size was normal in size. There is  no left ventricular hypertrophy. Left ventricular diastolic parameters were normal. Right Ventricle: The right ventricular size is normal. No increase in right ventricular wall thickness. Right ventricular systolic function is normal. There is normal pulmonary artery systolic pressure. The tricuspid regurgitant velocity is 2.54 m/s, and  with an  assumed right atrial pressure of 10 mmHg, the estimated right ventricular systolic  pressure is 94.8 mmHg. Left Atrium: Left atrial size was normal in size. Right Atrium: Right atrial size was normal in size. Pericardium: There is no evidence of pericardial effusion. Mitral Valve: The mitral valve is grossly normal. No evidence of mitral valve regurgitation. Tricuspid Valve: The tricuspid valve is not well visualized. Tricuspid valve regurgitation is mild. Aortic Valve: The aortic valve is grossly normal. Aortic valve regurgitation is not visualized. Pulmonic Valve: The pulmonic valve was not well visualized. Pulmonic valve regurgitation is not visualized. Aorta: The aortic root is normal in size and structure. IAS/Shunts: The interatrial septum was not assessed.  LEFT VENTRICLE PLAX 2D LVIDd:         3.63 cm  Diastology LVIDs:         2.26 cm  LV e' lateral:   11.10 cm/s LV PW:         1.08 cm  LV E/e' lateral: 5.9 LV IVS:        1.02 cm  LV e' medial:    7.96 cm/s LVOT diam:     2.10 cm  LV E/e' medial:  8.2 LV SV:         88 LV SV Index:   49 LVOT Area:     3.46 cm  RIGHT VENTRICLE RV Basal diam:  2.98 cm RV S prime:     16.70 cm/s TAPSE (M-mode): 2.1 cm LEFT ATRIUM             Index       RIGHT ATRIUM          Index LA diam:        4.40 cm 2.47 cm/m  RA Area:     9.99 cm LA Vol (A2C):   41.9 ml 23.54 ml/m RA Volume:   22.40 ml 12.59 ml/m LA Vol (A4C):   24.3 ml 13.65 ml/m LA Biplane Vol: 32.1 ml 18.04 ml/m  AORTIC VALVE LVOT Vmax:   127.00 cm/s LVOT Vmean:  95.700 cm/s LVOT VTI:    0.253 m  AORTA Ao Root diam: 3.60 cm MITRAL VALVE               TRICUSPID VALVE MV Area (PHT): 2.26 cm    TR Peak grad:   25.8 mmHg MV Decel Time: 335 msec    TR Vmax:        254.00 cm/s MV E velocity: 65.60 cm/s MV A velocity: 97.90 cm/s  SHUNTS MV E/A ratio:  0.67        Systemic VTI:  0.25 m                            Systemic Diam: 2.10 cm Bartholome Bill MD Electronically signed by Bartholome Bill MD Signature Date/Time: 08/28/2019/8:38:16 AM    Final    [place] EKG SITE RITE  Result Date: 08/26/2019 If Site Rite  image not attached, placement could not be confirmed due to current cardiac rhythm.  US Abdomen Limited RUQ  Result Date: 08/22/2019 CLINICAL DATA:  Elevated liver enzymes EXAM: ULTRASOUND ABDOMEN LIMITED RIGHT UPPER QUADRANT COMPARISON:  CT abdomen and pelvis August 19, 2019 FINDINGS: Surgically absent. Common bile duct: Diameter: 6 mm. No intrahepatic or extrahepatic biliary duct dilatation. Liver: No focal lesion identified. Liver echogenicity is somewhat inhomogeneous without focal liver lesion demonstrable. Portal vein is patent on color Doppler imaging with normal direction of blood  flow towards the liver. Other: None. IMPRESSION: 1. Liver echogenicity is somewhat inhomogeneous which may indicate fatty infiltration. No focal liver lesions are demonstrable by ultrasound. 2.  Gallbladder absent. Electronically Signed   By: Lowella Grip III M.D.   On: 08/22/2019 08:15   (Echo, Carotid, EGD, Colonoscopy, ERCP)    Subjective: Patient seen and examined.  No overnight events.  Pain is controlled.  She was able to urinate without trouble overnight along with relief of constipation.  She is looking forward to go to rehab and then ultimately go home.   Discharge Exam: Vitals:   09/03/19 0429 09/03/19 0806  BP: 119/68 119/69  Pulse:  73  Resp: 16 19  Temp: 98 F (36.7 C) 98.4 F (36.9 C)  SpO2: 95% 97%   Vitals:   09/02/19 1659 09/02/19 2005 09/03/19 0429 09/03/19 0806  BP: 123/65 123/74 119/68 119/69  Pulse: 71   73  Resp: _0 Temp: 97.7 F (36.5 C) 98 F (36.7 C) 98 F (36.7 C) 98.4 F (36.9 C)  TempSrc: Oral Oral Oral Oral  SpO2: 97% 96% 95% 97%  Weight:   76.7 kg   Height:        General: Pt is alert, awake, not in acute distress Chronically sick looking and frail and debilitated. Cardiovascular: RRR, S1/S2 +, no rubs, no gallops Respiratory: CTA bilaterally, no wheezing, no rhonchi Abdominal: Soft, NT, ND, bowel sounds + Extremities: no edema, no cyanosis,  generalized weakness.    The results of significant diagnostics from this hospitalization (including imaging, microbiology, ancillary and laboratory) are listed below for reference.     Microbiology: Recent Results (from the past 240 hour(s))  Aerobic/Anaerobic Culture (surgical/deep wound)     Status: None   Collection Time: 08/25/19 12:52 PM   Specimen: Abscess  Result Value Ref Range Status   Specimen Description ABSCESS  Final   Special Requests post CT guided paraspinal drain  Final   Gram Stain   Final    ABUNDANT WBC PRESENT, PREDOMINANTLY PMN NO ORGANISMS SEEN    Culture   Final    FEW PROTEUS MIRABILIS NO ANAEROBES ISOLATED Performed at [facility], [address] [place], [place] [address]    Report Status 08/30/2019 FINAL  Final   Organism ID, Bacteria PROTEUS MIRABILIS  Final      Susceptibility   Proteus mirabilis - MIC*    AMPICILLIN <=2 SENSITIVE Sensitive     CEFAZOLIN <=4 SENSITIVE Sensitive     CEFEPIME <=0.12 SENSITIVE Sensitive     CEFTAZIDIME <=1 SENSITIVE Sensitive     CEFTRIAXONE <=0.25 SENSITIVE Sensitive     CIPROFLOXACIN <=0.25 SENSITIVE Sensitive     GENTAMICIN <=1 SENSITIVE Sensitive     IMIPENEM 2 SENSITIVE Sensitive     TRIMETH/SULFA <=20 SENSITIVE Sensitive     AMPICILLIN/SULBACTAM <=2 SENSITIVE Sensitive     PIP/TAZO <=4 SENSITIVE Sensitive     * FEW PROTEUS MIRABILIS  Culture, blood (Routine X 2) w Reflex to ID Panel     Status: None   Collection Time: 08/27/19  3:21 PM   Specimen: BLOOD  Result Value Ref Range Status   Specimen Description BLOOD BLOOD LEFT HAND  Final   Special Requests   Final    BOTTLES DRAWN AEROBIC ONLY Blood Culture results may not be optimal due to an inadequate volume of blood received in culture bottles   Culture   Final    NO GROWTH 5 DAYS Performed at  [facility], [address], [place], [place] [address]    Report Status 09/01/2019 FINAL  Final  Culture, blood (Routine X 2) w Reflex  to ID Panel     Status: None   Collection Time: 08/27/19  3:21 PM   Specimen: BLOOD  Result Value Ref Range Status   Specimen Description BLOOD LEFT ANTECUBITAL  Final   Special Requests   Final    BOTTLES DRAWN AEROBIC ONLY Blood Culture adequate volume   Culture   Final    NO GROWTH 5 DAYS Performed at [facility], [address], [place], [place] [address]    Report Status 09/01/2019 FINAL  Final  SARS Coronavirus 2 by RT PCR (hospital order, performed in [facility] hospital lab) Nasopharyngeal Nasopharyngeal Swab     Status: None   Collection Time: 09/02/19  4:06 PM   Specimen: Nasopharyngeal Swab  Result Value Ref Range Status   SARS Coronavirus 2 NEGATIVE NEGATIVE Final    Comment: (NOTE) SARS-CoV-2 target nucleic acids are NOT DETECTED.  The SARS-CoV-2 RNA is generally detectable in upper and lower respiratory specimens during the acute phase of infection. The lowest concentration of SARS-CoV-2 viral copies this assay can detect is 250 copies / mL. A negative result does not preclude SARS-CoV-2 infection and should not be used as the sole basis for treatment or other patient management decisions.  A negative result may occur with improper specimen collection / handling, submission of specimen other than nasopharyngeal swab, presence of viral mutation(s) within the areas targeted by this assay, and inadequate number of viral copies (<250 copies / mL). A negative result must be combined with clinical observations, patient history, and epidemiological information.  Fact Sheet for Patients:   StrictlyIdeas.no  Fact Sheet for Healthcare Providers: BankingDealers.co.za  This test is not yet approved or  cleared by the [place] FDA and has been authorized for detection and/or diagnosis of SARS-CoV-2 by FDA under an Emergency Use Authorization (EUA).  This EUA will remain in effect (meaning this test can be used)  for the duration of the COVID-19 declaration under Section 564(b)(1) of the Act, 21 U.S.C. section 360bbb-3(b)(1), unless the authorization is terminated or revoked sooner.  Performed at [facility], [address] [place], [place] [address]      Labs: BNP (last 3 results) No results for input(s): BNP in the last 8760 hours. Basic Metabolic Panel: Recent Labs  Lab 08/28/19 0541 09/01/19 0619 09/02/19 0538 09/03/19 0500  NA 138 140 140 139  K 4.1 4.3 4.1 4.0  CL 105 110 109 107  CO2 _0 GLUCOSE 129* 98 88 97  BUN 26* _1 CREATININE 0.55 0.56 0.52 0.63  CALCIUM 8.0* 8.1* 8.2* 8.1*  MG 1.8 1.7  --   --   PHOS 3.4 3.5  --   --    Liver Function Tests: Recent Labs  Lab 08/28/19 0541 09/01/19 0619 09/02/19 0538 09/03/19 0500  AST 58* 41 33 26  ALT _2 ALKPHOS 70 61 60 64  BILITOT 0.5 0.5 0.5 0.5  PROT 6.8 6.3* 6.5 6.3*  ALBUMIN 2.2* 2.2* 2.2* 2.3*   No results for input(s): LIPASE, AMYLASE in the last 168 hours. No results for input(s): AMMONIA in the last 168 hours. CBC: Recent Labs  Lab 08/28/19 0541 08/31/19 1200 09/01/19 0619 09/02/19 0538 09/03/19 0500  WBC 11.5* 9.5 8.4 7.1 10.1  NEUTROABS 9.0* 7.6 6.0 4.8 7.7  HGB 10.6* 9.9* 10.3* 10.9* 10.7*  HCT 33.1* 30.1* 31.2* 34.9* 32.1*  MCV 97.4 96.2 96.3 100.3* 97.3  PLT 345 327 366 295 317   Cardiac Enzymes: No results for input(s): CKTOTAL, CKMB, CKMBINDEX, TROPONINI in the last 168 hours. BNP: Invalid input(s): POCBNP CBG: Recent Labs  Lab 09/02/19 0724 09/02/19 1125 09/02/19 1656 09/02/19 2109 09/03/19 0807  GLUCAP 77 119* 108* 105* 101*   D-Dimer No results for input(s): DDIMER in the last 72 hours. Hgb A1c No results for input(s): HGBA1C in the last 72 hours. Lipid Profile No results for input(s): CHOL, HDL, LDLCALC, TRIG, CHOLHDL, LDLDIRECT in the last 72 hours. Thyroid function studies Recent Labs    08/31/19 1200  TSH 13.319*   Anemia work  up Recent Labs    08/31/19 1200 09/03/19 0500  VITAMINB12 688  --   FOLATE  --  28.0   Urinalysis    Component Value Date/Time   COLORURINE AMBER (A) 08/27/2019 1617   APPEARANCEUR TURBID (A) 08/27/2019 1617   APPEARANCEUR Cloudy (A) 09/05/2017 1535   LABSPEC 1.017 08/27/2019 1617   LABSPEC 1.021 01/13/2014 0737   PHURINE 6.0 08/27/2019 1617   GLUCOSEU NEGATIVE 08/27/2019 1617   GLUCOSEU >=500 01/13/2014 0737   HGBUR LARGE (A) 08/27/2019 1617   BILIRUBINUR NEGATIVE 08/27/2019 1617   BILIRUBINUR negative 06/20/2019 1144   BILIRUBINUR Negative 09/05/2017 1535   BILIRUBINUR Negative 01/13/2014 0737   KETONESUR NEGATIVE 08/27/2019 1617   PROTEINUR 100 (A) 08/27/2019 1617   UROBILINOGEN negative (A) 06/20/2019 1144   NITRITE NEGATIVE 08/27/2019 1617   LEUKOCYTESUR LARGE (A) 08/27/2019 1617   LEUKOCYTESUR 3+ 01/13/2014 0737   Sepsis Labs Invalid input(s): PROCALCITONIN,  WBC,  LACTICIDVEN Microbiology Recent Results (from the past 240 hour(s))  Aerobic/Anaerobic Culture (surgical/deep wound)     Status: None   Collection Time: 08/25/19 12:52 PM   Specimen: Abscess  Result Value Ref Range Status   Specimen Description ABSCESS  Final   Special Requests post CT guided paraspinal drain  Final   Gram Stain   Final    ABUNDANT WBC PRESENT, PREDOMINANTLY PMN NO ORGANISMS SEEN    Culture   Final    FEW PROTEUS MIRABILIS NO ANAEROBES ISOLATED Performed at [facility], [address], [place], [place] [address]    Report Status 08/30/2019 FINAL  Final   Organism ID, Bacteria PROTEUS MIRABILIS  Final      Susceptibility   Proteus mirabilis - MIC*    AMPICILLIN <=2 SENSITIVE Sensitive     CEFAZOLIN <=4 SENSITIVE Sensitive     CEFEPIME <=0.12 SENSITIVE Sensitive     CEFTAZIDIME <=1 SENSITIVE Sensitive     CEFTRIAXONE <=0.25 SENSITIVE Sensitive     CIPROFLOXACIN <=0.25 SENSITIVE Sensitive     GENTAMICIN <=1 SENSITIVE Sensitive     IMIPENEM 2 SENSITIVE Sensitive      TRIMETH/SULFA <=20 SENSITIVE Sensitive     AMPICILLIN/SULBACTAM <=2 SENSITIVE Sensitive     PIP/TAZO <=4 SENSITIVE Sensitive     * FEW PROTEUS MIRABILIS  Culture, blood (Routine X 2) w Reflex to ID Panel     Status: None   Collection Time: 08/27/19  3:21 PM   Specimen: BLOOD  Result Value Ref Range Status   Specimen Description BLOOD BLOOD LEFT HAND  Final   Special Requests   Final    BOTTLES DRAWN AEROBIC ONLY Blood Culture results may not be optimal due to an inadequate volume of blood received in culture bottles   Culture  Final    NO GROWTH 5 DAYS Performed at [facility], [address] [place], [place] [address]    Report Status 09/01/2019 FINAL  Final  Culture, blood (Routine X 2) w Reflex to ID Panel     Status: None   Collection Time: 08/27/19  3:21 PM   Specimen: BLOOD  Result Value Ref Range Status   Specimen Description BLOOD LEFT ANTECUBITAL  Final   Special Requests   Final    BOTTLES DRAWN AEROBIC ONLY Blood Culture adequate volume   Culture   Final    NO GROWTH 5 DAYS Performed at [facility], [address], [place], [place] [address]    Report Status 09/01/2019 FINAL  Final  SARS Coronavirus 2 by RT PCR (hospital order, performed in [facility] hospital lab) Nasopharyngeal Nasopharyngeal Swab     Status: None   Collection Time: 09/02/19  4:06 PM   Specimen: Nasopharyngeal Swab  Result Value Ref Range Status   SARS Coronavirus 2 NEGATIVE NEGATIVE Final    Comment: (NOTE) SARS-CoV-2 target nucleic acids are NOT DETECTED.  The SARS-CoV-2 RNA is generally detectable in upper and lower respiratory specimens during the acute phase of infection. The lowest concentration of SARS-CoV-2 viral copies this assay can detect is 250 copies / mL. A negative result does not preclude SARS-CoV-2 infection and should not be used as the sole basis for treatment or other patient management decisions.  A negative result may occur with improper specimen  collection / handling, submission of specimen other than nasopharyngeal swab, presence of viral mutation(s) within the areas targeted by this assay, and inadequate number of viral copies (<250 copies / mL). A negative result must be combined with clinical observations, patient history, and epidemiological information.  Fact Sheet for Patients:   StrictlyIdeas.no  Fact Sheet for Healthcare Providers: BankingDealers.co.za  This test is not yet approved or  cleared by the [place] FDA and has been authorized for detection and/or diagnosis of SARS-CoV-2 by FDA under an Emergency Use Authorization (EUA).  This EUA will remain in effect (meaning this test can be used) for the duration of the COVID-19 declaration under Section 564(b)(1) of the Act, 21 U.S.C. section 360bbb-3(b)(1), unless the authorization is terminated or revoked sooner.  Performed at [facility], [address], [place], [place] [address]      Time coordinating discharge:  40 minutes  SIGNED:   Barb Merino, MD  [facility] 09/03/2019, 11:07 AM

## 2019-09-03 NOTE — Evaluation (Signed)
Physical Therapy Evaluation Patient Details Name: Theresa Mills MRN: [id number] DOB: 1950-10-24 Today's Date: 09/03/2019   History of Present Illness  Pt is a 69 y.o. female with medical history of DM, GERD, anxiety, OSA, Parkinson's with dementia, hypothyroidism, frequent falls, brought in 08/19/19 by EMS after SNF staff called due to shortness of breath and altered mental status.  Patient complained of abdominal pain and neck pain but could not provide additional history due to dementia and altered mental status. Workup showed lumbar osteomyelitis and discitis, epidural abscess and right psoas abscess (s/p drain placed) and compression fracture at the superior endplate of L4.    Clinical Impression  Patient in bed, oriented to her name, place, disoriented to time and situation. Pt unable to provide PLOF, gathered from chart pt was ambulatory at baseline.  The patient was able to perform UE and LE exercises with physical assist and constant cueing. Exhibited and complained of ongoing LLE pain. Attempted rolling to L, pt minA and heavy use of bed rails. Pt grimacing and yelling in pain, further mobility deferred.  Overall the patient demonstrated deficits (see "PT Problem List") that impede the patient's functional abilities, safety, and mobility and would benefit from skilled PT intervention. Recommendation is SNF to maximize mobility and function.     Follow Up Recommendations SNF    Equipment Recommendations  None recommended by PT    Recommendations for Other Services       Precautions / Restrictions Precautions Precautions: Fall Restrictions Weight Bearing Restrictions: No      Mobility  Bed Mobility Overal bed mobility: Needs Assistance Bed Mobility: Rolling Rolling: Min assist         General bed mobility comments: heavy use of bed rails to attempt rolling. Pt yelled out in pain throughout attempt, further mobility deferred  Transfers                 General  transfer comment: not attempted this date due to pain  Ambulation/Gait                Stairs            Wheelchair Mobility    Modified Rankin (Stroke Patients Only)       Balance                                             Pertinent Vitals/Pain Pain Assessment: Faces Faces Pain Scale: Hurts whole lot Pain Location: LLE Pain Descriptors / Indicators: Aching;Discomfort;Dull;Grimacing;Guarding Pain Intervention(s): Limited activity within patient's tolerance;Repositioned;Monitored during session    [place] expects to be discharged to:: Private residence Living Arrangements: Alone Available Help at Discharge: Personal care attendant (PRN to run errands, no physical assist) Type of Home: House Home Access: Level entry     Home Layout: One level Home Equipment: Walker - 4 wheels;Bedside commode;Shower seat;Hand held shower head;Adaptive equipment Additional Comments: PLOF gathered from previous hospital stay. Pt able to report that she lives alone.    Prior Function Level of Independence: Independent with assistive device(s)         Comments: was previously using rollater, however reports multiple falls. Room to room ambulator     Hand Dominance        Extremity/Trunk Assessment   Upper Extremity Assessment Upper Extremity Assessment: Generalized weakness (unable to lift either UE above 90degrees)    Lower  Extremity Assessment Lower Extremity Assessment: Generalized weakness (2/5 grossly bilaterally)       Communication   Communication: No difficulties  Cognition Arousal/Alertness: Awake/alert Behavior During Therapy: WFL for tasks assessed/performed;Flat affect Overall Cognitive Status: No family/caregiver present to determine baseline cognitive functioning                                        General Comments      Exercises Other Exercises Other Exercises: ankle pumps AROM x10,  SLR, heel sides, hip abduction x5 bilaterally. UE shoulder flexion, elbow flex/extension x5 bilaterally   Assessment/Plan    PT Assessment Patient needs continued PT services  PT Problem List Decreased strength;Decreased activity tolerance;Decreased balance;Decreased mobility;Decreased safety awareness;Pain;Decreased range of motion       PT Treatment Interventions Gait training;DME instruction;Therapeutic activities;Therapeutic exercise;Balance training    PT Goals (Current goals can be found in the Care Plan section)  Acute Rehab PT Goals PT Goal Formulation: Patient unable to participate in goal setting Time For Goal Achievement: 09/17/19 Potential to Achieve Goals: Fair    Frequency Min 2X/week   Barriers to discharge        Co-evaluation               AM-PAC PT "6 Clicks" Mobility  Outcome Measure Help needed turning from your back to your side while in a flat bed without using bedrails?: A Lot Help needed moving from lying on your back to sitting on the side of a flat bed without using bedrails?: Total Help needed moving to and from a bed to a chair (including a wheelchair)?: Total Help needed standing up from a chair using your arms (e.g., wheelchair or bedside chair)?: Total Help needed to walk in hospital room?: Total Help needed climbing 3-5 steps with a railing? : Total 6 Click Score: 7    End of Session Equipment Utilized During Treatment: Oxygen Activity Tolerance: Patient limited by pain Patient left: in bed;with bed alarm set;with call bell/phone within reach Nurse Communication: Mobility status PT Visit Diagnosis: Repeated falls (R29.6);Muscle weakness (generalized) (M62.81);History of falling (Z91.81);Difficulty in walking, not elsewhere classified (R26.2);Pain Pain - Right/Left: Left Pain - part of body: Knee    Time: 6333-5456 PT Time Calculation (min) (ACUTE ONLY): 15 min   Charges:   PT Evaluation $PT Eval Moderate Complexity: 1 Mod          Lieutenant Diego PT, DPT 10:46 AM,09/03/19

## 2019-09-03 NOTE — NC FL2 (Signed)
[place] LEVEL OF CARE SCREENING TOOL     IDENTIFICATION  Patient Name: Theresa Mills Birthdate: 1950/05/25 Sex: female Admission Date (Current Location): 08/19/2019  [facility] and [place] Number:  Engineering geologist and Address:         Provider Number: [id number]  Attending Physician Name and Address:  Barb Merino, MD  Relative Name and Phone Number:       Current Level of Care: Hospital Recommended Level of Care: [place] Prior Approval Number:    Date Approved/Denied:   PASRR Number: [id number]  Discharge Plan: SNF    Current Diagnoses: Patient Active Problem List   Diagnosis Date Noted  . Lumbar compression fracture ([place]) 08/25/2019  . Osteomyelitis of lumbar vertebra ([place]) 08/25/2019  . Lumbar discitis 08/25/2019  . Malnutrition of moderate degree 08/20/2019  . Severe sepsis ([place]) 08/19/2019  . Bilateral hydronephrosis 08/19/2019  . Acute pyelonephritis 08/19/2019  . Acute metabolic encephalopathy 47/82/9562  . Acute urinary retention 08/19/2019  . AKI (acute kidney injury) ([place]) 08/19/2019  . Osteoarthritis of multiple joints   . Frequent falls 08/05/2019  . Urinary tract infection with hematuria 06/11/2019  . Nausea and vomiting 02/13/2019  . Acquired hypothyroidism 02/13/2019  . Acquired bilateral hammer toes 01/23/2019  . Type 2 diabetes mellitus with hyperglycemia ([place]) 09/15/2018  . Encounter for general adult medical examination with abnormal findings 09/05/2017  . Dysuria 09/05/2017  . Uncontrolled type 2 diabetes mellitus with hyperglycemia ([place]) 06/18/2017  . Rib fractures 06/11/2017  . Pressure injury of coccygeal region, stage 2 ([place]) 06/11/2017  . Rhabdomyolysis 06/10/2017  . Elevated troponin 06/10/2017  . Hypernatremia 06/10/2017  . Fall 06/10/2017  . Arthritis 01/08/2017  . Cataracts, bilateral 01/08/2017  . Chickenpox 01/08/2017  . Shingles 01/08/2017  . Depression 01/08/2017  . Diabetes  mellitus type 2, uncomplicated ([place]) 13/08/6576  . Hyperlipidemia, unspecified 01/08/2017  . Essential hypertension 01/08/2017  . Migraine headache 01/08/2017  . Thyroid disease 01/08/2017  . Parkinson disease ([place]) 01/08/2017  . Anxiety, generalized 09/01/2016  . Dementia due to Parkinson's disease without behavioral disturbance ([place]) 09/01/2016  . History of depression 09/01/2016  . Morbid obesity with BMI of 40.0-44.9, adult ([place]) 09/01/2016  . RLS (restless legs syndrome) 09/01/2016    Orientation RESPIRATION BLADDER Height & Weight     Self, Time, Situation, Place  Normal Incontinent, External catheter Weight: 76.7 kg Height:  5\' 4"  (162.6 cm)  BEHAVIORAL SYMPTOMS/MOOD NEUROLOGICAL BOWEL NUTRITION STATUS      Continent Diet  AMBULATORY STATUS COMMUNICATION OF NEEDS Skin   Total Care Verbally Normal                       Personal Care Assistance Level of Assistance              Functional Limitations Info             SPECIAL CARE FACTORS FREQUENCY  PT (By licensed PT), OT (By licensed OT)     PT Frequency: 5x a week OT Frequency: 5x a week            Contractures Contractures Info: Not present    Additional Factors Info  Code Status, Allergies Code Status Info: Full Allergies Info: Codeine, ephadrene, valium           Current Medications (09/03/2019):  This is the current hospital active medication list Current Facility-Administered Medications  Medication Dose Route Frequency Provider Last Rate Last Admin  . 0.9 %  sodium chloride infusion   Intravenous PRN Para Skeans, MD   Stopped at 08/29/19 2109  . acetaminophen (TYLENOL) tablet 650 mg  650 mg Oral Q6H PRN Athena Masse, MD   650 mg at 09/01/19 0962   Or  . acetaminophen (TYLENOL) suppository 650 mg  650 mg Rectal Q6H PRN Athena Masse, MD      . ALPRAZolam Duanne Moron) tablet 0.25 mg  0.25 mg Oral q morning - 10a Nicole Kindred A, DO   0.25 mg at 09/03/19 0853  . ALPRAZolam Duanne Moron)  tablet 0.5 mg  0.5 mg Oral QHS PRN Nicole Kindred A, DO   0.5 mg at 08/22/19 2003  . calcium-vitamin D (OSCAL WITH D) 500-200 MG-UNIT per tablet 1 tablet  1 tablet Oral Daily Nicole Kindred A, DO   1 tablet at 09/03/19 0851  . carbidopa-levodopa (SINEMET CR) 50-200 MG per tablet controlled release 1 tablet  1 tablet Oral BID Nicole Kindred A, DO   1 tablet at 09/03/19 0852  . cefTRIAXone (ROCEPHIN) 2 g in sodium chloride 0.9 % 100 mL IVPB  2 g Intravenous Q24H Ravishankar, Joellyn Quails, MD 200 mL/hr at 09/02/19 1353 2 g at 09/02/19 1353  . celecoxib (CELEBREX) capsule 200 mg  200 mg Oral BID PRN Nicole Kindred A, DO   200 mg at 09/01/19 2038  . Chlorhexidine Gluconate Cloth 2 % PADS 6 each  6 each Topical Q0600 Nicole Kindred A, DO   6 each at 09/03/19 0546  . citalopram (CELEXA) tablet 40 mg  40 mg Oral Daily Nicole Kindred A, DO   40 mg at 09/03/19 0851  . docusate sodium (COLACE) capsule 100 mg  100 mg Oral BID Nicole Kindred A, DO   100 mg at 09/03/19 8366  . enoxaparin (LOVENOX) injection 40 mg  40 mg Subcutaneous Q24H Nicole Kindred A, DO   40 mg at 09/02/19 2119  . feeding supplement (ENSURE ENLIVE) (ENSURE ENLIVE) liquid 237 mL  237 mL Oral TID BM Nicole Kindred A, DO   237 mL at 09/02/19 2120  . HYDROcodone-acetaminophen (NORCO/VICODIN) 5-325 MG per tablet 1 tablet  1 tablet Oral Q4H PRN Barb Merino, MD   1 tablet at 09/03/19 0853  . hydrocortisone cream 1 % 1 application  1 application Topical TID PRN Para Skeans, MD      . insulin aspart (novoLOG) injection 0-15 Units  0-15 Units Subcutaneous TID WC Athena Masse, MD   3 Units at 09/01/19 1233  . insulin aspart (novoLOG) injection 0-5 Units  0-5 Units Subcutaneous QHS Judd Gaudier V, MD      . levothyroxine (SYNTHROID) tablet 188 mcg  188 mcg Oral Q0600 Para Skeans, MD   188 mcg at 09/03/19 0546  . megestrol (MEGACE) tablet 40 mg  40 mg Oral Daily Nicole Kindred A, DO   40 mg at 09/03/19 0852  . melatonin tablet 5 mg  5 mg  Oral QHS PRN Nicole Kindred A, DO   5 mg at 08/27/19 2203  . mirtazapine (REMERON) tablet 7.5 mg  7.5 mg Oral QHS Nicole Kindred A, DO   7.5 mg at 09/02/19 2121  . multivitamin with minerals tablet 1 tablet  1 tablet Oral Daily Nicole Kindred A, DO   1 tablet at 09/02/19 0905  . ondansetron (ZOFRAN) tablet 4 mg  4 mg Oral Q6H PRN Athena Masse, MD       Or  . ondansetron [facility]) injection 4 mg  4 mg Intravenous  Q6H PRN Athena Masse, MD      . pantoprazole (PROTONIX) EC tablet 40 mg  40 mg Oral Daily Nicole Kindred A, DO   40 mg at 09/03/19 0853  . rOPINIRole (REQUIP) tablet 0.5 mg  0.5 mg Oral BID Nicole Kindred A, DO   0.5 mg at 09/03/19 0851  . rOPINIRole (REQUIP) tablet 1 mg  1 mg Oral QHS Nicole Kindred A, DO   1 mg at 09/02/19 2120  . senna (SENOKOT) tablet 8.6 mg  1 tablet Oral Daily Florina Ou V, MD   8.6 mg at 09/03/19 0851  . sertraline (ZOLOFT) tablet 50 mg  50 mg Oral Daily Nicole Kindred A, DO   50 mg at 09/03/19 0853  . simvastatin (ZOCOR) tablet 40 mg  40 mg Oral QHS Nicole Kindred A, DO   40 mg at 09/02/19 2120  . sodium chloride flush (NS) 0.9 % injection 10-40 mL  10-40 mL Intracatheter Q12H Para Skeans, MD   10 mL at 09/03/19 0851  . sodium chloride flush (NS) 0.9 % injection 10-40 mL  10-40 mL Intracatheter PRN Florina Ou V, MD      . sodium chloride flush (NS) 0.9 % injection 5 mL  5 mL Intracatheter Q8H Sandi Mariscal, MD   5 mL at 09/03/19 0547  . tamsulosin (FLOMAX) capsule 0.4 mg  0.4 mg Oral QPC supper Nicole Kindred A, DO   0.4 mg at 09/02/19 1815     Discharge Medications: Please see discharge summary for a list of discharge medications.  Relevant Imaging Results:  Relevant Lab Results:   Additional Information SSN [id number]  Victorino Dike, RN

## 2019-09-04 DIAGNOSIS — F0280 Dementia in other diseases classified elsewhere without behavioral disturbance: Secondary | ICD-10-CM | POA: Diagnosis not present

## 2019-09-04 DIAGNOSIS — E119 Type 2 diabetes mellitus without complications: Secondary | ICD-10-CM | POA: Diagnosis not present

## 2019-09-04 DIAGNOSIS — M4647 Discitis, unspecified, lumbosacral region: Secondary | ICD-10-CM | POA: Diagnosis not present

## 2019-09-04 DIAGNOSIS — G20 Parkinson's disease: Secondary | ICD-10-CM | POA: Diagnosis not present

## 2019-09-09 ENCOUNTER — Ambulatory Visit: Payer: Medicare Other | Admitting: Physician Assistant

## 2019-09-09 ENCOUNTER — Encounter: Payer: Self-pay | Admitting: Physician Assistant

## 2019-09-11 ENCOUNTER — Ambulatory Visit: Payer: Medicare Other | Admitting: Podiatry

## 2019-09-17 ENCOUNTER — Other Ambulatory Visit: Payer: Self-pay | Admitting: Radiology

## 2019-09-17 ENCOUNTER — Other Ambulatory Visit: Payer: Self-pay

## 2019-09-17 ENCOUNTER — Ambulatory Visit (INDEPENDENT_AMBULATORY_CARE_PROVIDER_SITE_OTHER): Payer: Medicare Other | Admitting: Physician Assistant

## 2019-09-17 ENCOUNTER — Encounter: Payer: Self-pay | Admitting: Physician Assistant

## 2019-09-17 VITALS — BP 158/92 | HR 108 | Temp 97.6°F

## 2019-09-17 DIAGNOSIS — R338 Other retention of urine: Secondary | ICD-10-CM

## 2019-09-17 DIAGNOSIS — R3129 Other microscopic hematuria: Secondary | ICD-10-CM

## 2019-09-17 LAB — BLADDER SCAN AMB NON-IMAGING: Scan Result: 0

## 2019-09-17 NOTE — Progress Notes (Signed)
09/17/2019 10:26 AM   Theresa Mills 12/25/1950 [id number]  CC: Chief Complaint  Patient presents with  . Follow-up    HPI: Theresa Mills is a 69 y.o. female with PMH DM, Parkinson's dementia, and frequent falls recent hospitalized for Proteus urosepsis with acute left pyelonephritis and acute cystitis with microscopic hematuria, acute urinary retention with bilateral hydronephrosis and AKI, and lumbar osteomyelitis and discitis.  Foley catheter was removed prior to discharge on 09/03/2019.  She presents today for PVR and repeat UA to prove resolution of hematuria following culture appropriate antibiotics.  Today, she reports she has been voiding well with no acute urinary concerns.  However, she is extremely uncomfortable in her wheelchair secondary to chronic pain.  She would like to return back to her facility ASAP.  She denies recent changes in the quality of her pain and says that this is typical for her.  PVR 0 mL, unable to obtain UA today.  PMH: Past Medical History:  Diagnosis Date  . Anxiety   . Diabetes ([place])   . Difficult intravenous access   . GERD (gastroesophageal reflux disease)   . HBP (high blood pressure)   . Hyperlipidemia   . Osteoarthritis   . Parkinson disease ([place])   . Sleep apnea   . Thyroid disease     Surgical History: Past Surgical History:  Procedure Laterality Date  . ABDOMINAL HYSTERECTOMY    . CATARACT EXTRACTION Bilateral   . CHOLECYSTECTOMY    . COLONOSCOPY WITH PROPOFOL N/A 04/02/2015   Procedure: COLONOSCOPY WITH PROPOFOL;  Surgeon: Josefine Class, MD;  Location: [facility] ENDOSCOPY;  Service: Endoscopy;  Laterality: N/A;  . ESOPHAGOGASTRODUODENOSCOPY (EGD) WITH PROPOFOL N/A 04/02/2015   Procedure: ESOPHAGOGASTRODUODENOSCOPY (EGD) WITH PROPOFOL;  Surgeon: Josefine Class, MD;  Location: [facility] ENDOSCOPY;  Service: Endoscopy;  Laterality: N/A;  . GALLBLADDER SURGERY    . LAPAROSCOPIC HYSTERECTOMY    . THYROIDECTOMY Left   .  TONSILLECTOMY    . TONSILLECTOMY Bilateral     Home Medications:  Allergies as of 09/17/2019      Reactions   Codeine Other (See Comments)   HALLUCINATIONS   Ephadrene [cholestatin] Other (See Comments)   HYPER AND NERVOUS   Other Other (See Comments)   HYPER HYPER AND NERVOUS   Valium [diazepam] Other (See Comments)   OVERLY SENSITIVE/TOO STRONG      Medication List       Accurate as of September 17, 2019 10:26 AM. If you have any questions, ask your nurse or doctor.        ALPRAZolam 0.25 MG tablet Commonly known as: XANAX Take 1 tablet (0.25 mg total) by mouth 2 (two) times daily as needed for anxiety or sleep.   Calcium High Potency/Vitamin D 600-200 MG-UNIT Tabs Generic drug: Calcium Carbonate-Vitamin D Take 1 tablet by mouth daily.   carbidopa-levodopa 50-200 MG tablet Commonly known as: SINEMET CR Take 1 tablet by mouth 2 (two) times daily.   carbidopa-levodopa 25-100 MG tablet Commonly known as: SINEMET IR Take 2 tablets by mouth 4 (four) times daily.   cefTRIAXone  IVPB Commonly known as: ROCEPHIN Inject 2 g into the vein daily. Indication:  Proteus bacteremia, discitis, psoas abscess, UTI Last Day of Therapy: 10/06/2019 Labs - Once weekly:  CBC/D, CMP, CRP, ESR   celecoxib 200 MG capsule Commonly known as: CELEBREX Take 1 capsule (200 mg total) by mouth 2 (two) times daily as needed.   citalopram 40 MG tablet Commonly known as: CELEXA Take  40 mg by mouth daily.   docusate sodium 100 MG capsule Commonly known as: COLACE Take 1 capsule (100 mg total) by mouth 2 (two) times daily.   levothyroxine 125 MCG tablet Commonly known as: SYNTHROID Take 1.5 tablets (188 mcg total) by mouth daily at 6 (six) AM.   melatonin 3 MG Tabs tablet Take by mouth.   metFORMIN 500 MG tablet Commonly known as: GLUCOPHAGE Take 500 mg by mouth 2 (two) times daily with a meal.   mirtazapine 7.5 MG tablet Commonly known as: REMERON Take 1 tablet (7.5 mg total) by mouth  at bedtime.   MULTIVITAMIN WOMEN 50+ PO Take 1 tablet by mouth daily.   omeprazole 40 MG capsule Commonly known as: PRILOSEC Take 1 capsule (40 mg total) by mouth daily.   ondansetron 4 MG disintegrating tablet Commonly known as: ZOFRAN-ODT Take 1 tablet (4 mg total) by mouth every 8 (eight) hours as needed for nausea or vomiting.   OneTouch Delica Lancets 23F Misc TEST BLOOD SUGAR BID   OneTouch Verio test strip Generic drug: glucose blood TEST BLOOD SUGAR BID   rOPINIRole 2 MG 24 hr tablet Commonly known as: REQUIP XL Take 2 mg by mouth at bedtime.   sertraline 50 MG tablet Commonly known as: ZOLOFT Take 1 tablet (50 mg total) by mouth daily.   simvastatin 40 MG tablet Commonly known as: ZOCOR Take 1 tablet (40 mg total) by mouth at bedtime.       Allergies:  Allergies  Allergen Reactions  . Codeine Other (See Comments)    HALLUCINATIONS  . Ephadrene [Cholestatin] Other (See Comments)    HYPER AND NERVOUS  . Other Other (See Comments)    HYPER  HYPER AND NERVOUS  . Valium [Diazepam] Other (See Comments)    OVERLY SENSITIVE/TOO STRONG    Family History: Family History  Problem Relation Age of Onset  . Asthma Father   . Cancer Sister   . Stroke Maternal Uncle   . Heart disease Maternal Uncle   . Diabetes Maternal Uncle     Social History:   reports that she has never smoked. She has never used smokeless tobacco. She reports that she does not drink alcohol and does not use drugs.  Physical Exam: BP (!) 158/92   Pulse (!) 108   Temp 97.6 F (36.4 C)   Constitutional: Awake and alert, nontoxic appearing, verbalizes discomfort HEENT: [place], AT Cardiovascular: No clubbing, cyanosis, or edema Respiratory: Normal respiratory effort, no increased work of breathing Skin: No rashes, bruises or suspicious lesions Psychiatric: Agitated mood  Laboratory Data: Results for orders placed or performed in visit on 09/17/19  BLADDER SCAN AMB NON-IMAGING  Result  Value Ref Range   Scan Result 0 ml    Assessment & Plan:   1. Acute urinary retention Bladder empty today.  No concern for recurrent urinary retention.  No further intervention indicated at this time. - BLADDER SCAN AMB NON-IMAGING  2. Microscopic hematuria Unable to obtain UA in clinic today secondary to empty bladder and severe patient discomfort.  We have contacted the patient's rehab facility to arrange urine collection for UA there and requested that they fax [place] the results.  Return if symptoms worsen or fail to improve.  Debroah Loop, PA-C  [facility] [address] [place], [place] [address] [phone]

## 2019-09-18 ENCOUNTER — Ambulatory Visit: Payer: Medicare Other | Admitting: Nurse Practitioner

## 2019-09-24 ENCOUNTER — Other Ambulatory Visit: Payer: Self-pay | Admitting: Internal Medicine

## 2019-09-25 ENCOUNTER — Inpatient Hospital Stay: Payer: Medicare Other | Admitting: Infectious Diseases

## 2019-09-25 ENCOUNTER — Other Ambulatory Visit: Payer: Self-pay | Admitting: Physician Assistant

## 2019-09-26 ENCOUNTER — Other Ambulatory Visit: Payer: Self-pay | Admitting: Internal Medicine

## 2019-09-26 ENCOUNTER — Telehealth: Payer: Self-pay | Admitting: Physician Assistant

## 2019-09-26 DIAGNOSIS — M4316 Spondylolisthesis, lumbar region: Secondary | ICD-10-CM | POA: Diagnosis not present

## 2019-09-26 DIAGNOSIS — Z8781 Personal history of (healed) traumatic fracture: Secondary | ICD-10-CM | POA: Diagnosis not present

## 2019-09-26 DIAGNOSIS — S32040A Wedge compression fracture of fourth lumbar vertebra, initial encounter for closed fracture: Secondary | ICD-10-CM | POA: Diagnosis not present

## 2019-09-26 DIAGNOSIS — M47816 Spondylosis without myelopathy or radiculopathy, lumbar region: Secondary | ICD-10-CM | POA: Diagnosis not present

## 2019-09-26 NOTE — Telephone Encounter (Signed)
Received faxed UA and culture report from [facility]. Staff reports that this was a catheterized sample and she is not reporting infective symptoms including dysuria, urgency, frequency, lower abdominal pain, fever, chills, nausea, or vomiting. Will defer treatment for contaminated sample vs asymptomatic bacteriuria. In light of her multiple medical comorbidities and with no significant urologic findings on CT AP w/ contrast dated 09/02/2019, will defer repeat UA to prove resolution of MH. Staff made aware.

## 2019-09-30 ENCOUNTER — Observation Stay: Payer: Medicare Other

## 2019-09-30 ENCOUNTER — Observation Stay
Admission: EM | Admit: 2019-09-30 | Discharge: 2019-10-01 | Disposition: A | Discharge status: Home / Self Care | Payer: Medicare Other | Attending: Internal Medicine | Admitting: Internal Medicine

## 2019-09-30 ENCOUNTER — Ambulatory Visit: Payer: Medicare Other | Admitting: Infectious Diseases

## 2019-09-30 ENCOUNTER — Other Ambulatory Visit: Payer: Self-pay

## 2019-09-30 ENCOUNTER — Encounter: Payer: Self-pay | Admitting: Emergency Medicine

## 2019-09-30 DIAGNOSIS — E039 Hypothyroidism, unspecified: Secondary | ICD-10-CM | POA: Diagnosis not present

## 2019-09-30 DIAGNOSIS — G2581 Restless legs syndrome: Secondary | ICD-10-CM

## 2019-09-30 DIAGNOSIS — M4640 Discitis, unspecified, site unspecified: Secondary | ICD-10-CM

## 2019-09-30 DIAGNOSIS — E1165 Type 2 diabetes mellitus with hyperglycemia: Secondary | ICD-10-CM | POA: Insufficient documentation

## 2019-09-30 DIAGNOSIS — M549 Dorsalgia, unspecified: Secondary | ICD-10-CM | POA: Diagnosis not present

## 2019-09-30 DIAGNOSIS — M5126 Other intervertebral disc displacement, lumbar region: Secondary | ICD-10-CM | POA: Diagnosis not present

## 2019-09-30 DIAGNOSIS — M5136 Other intervertebral disc degeneration, lumbar region: Secondary | ICD-10-CM | POA: Diagnosis not present

## 2019-09-30 DIAGNOSIS — I10 Essential (primary) hypertension: Secondary | ICD-10-CM | POA: Insufficient documentation

## 2019-09-30 DIAGNOSIS — M545 Low back pain, unspecified: Secondary | ICD-10-CM

## 2019-09-30 DIAGNOSIS — G20A1 Parkinson's disease without dyskinesia, without mention of fluctuations: Secondary | ICD-10-CM | POA: Diagnosis present

## 2019-09-30 DIAGNOSIS — G20 Parkinson's disease: Secondary | ICD-10-CM

## 2019-09-30 DIAGNOSIS — M4626 Osteomyelitis of vertebra, lumbar region: Secondary | ICD-10-CM | POA: Diagnosis not present

## 2019-09-30 DIAGNOSIS — Z20822 Contact with and (suspected) exposure to covid-19: Secondary | ICD-10-CM | POA: Diagnosis not present

## 2019-09-30 DIAGNOSIS — G8929 Other chronic pain: Secondary | ICD-10-CM | POA: Diagnosis present

## 2019-09-30 DIAGNOSIS — E8729 Other acidosis: Secondary | ICD-10-CM

## 2019-09-30 DIAGNOSIS — G061 Intraspinal abscess and granuloma: Secondary | ICD-10-CM | POA: Diagnosis not present

## 2019-09-30 DIAGNOSIS — G062 Extradural and subdural abscess, unspecified: Secondary | ICD-10-CM

## 2019-09-30 DIAGNOSIS — M4646 Discitis, unspecified, lumbar region: Secondary | ICD-10-CM | POA: Diagnosis not present

## 2019-09-30 DIAGNOSIS — K6812 Psoas muscle abscess: Secondary | ICD-10-CM

## 2019-09-30 DIAGNOSIS — E872 Acidosis, unspecified: Secondary | ICD-10-CM

## 2019-09-30 DIAGNOSIS — Z7984 Long term (current) use of oral hypoglycemic drugs: Secondary | ICD-10-CM | POA: Diagnosis not present

## 2019-09-30 DIAGNOSIS — G602 Neuropathy in association with hereditary ataxia: Principal | ICD-10-CM | POA: Insufficient documentation

## 2019-09-30 DIAGNOSIS — Z79899 Other long term (current) drug therapy: Secondary | ICD-10-CM | POA: Diagnosis not present

## 2019-09-30 DIAGNOSIS — M4319 Spondylolisthesis, multiple sites in spine: Secondary | ICD-10-CM | POA: Diagnosis not present

## 2019-09-30 DIAGNOSIS — M48061 Spinal stenosis, lumbar region without neurogenic claudication: Secondary | ICD-10-CM | POA: Diagnosis not present

## 2019-09-30 DIAGNOSIS — Z452 Encounter for adjustment and management of vascular access device: Secondary | ICD-10-CM

## 2019-09-30 LAB — COMPREHENSIVE METABOLIC PANEL
ALT: 6 U/L (ref 0–44)
AST: 20 U/L (ref 15–41)
Albumin: 3.6 g/dL (ref 3.5–5.0)
Alkaline Phosphatase: 72 U/L (ref 38–126)
Anion gap: 16 — ABNORMAL HIGH (ref 5–15)
BUN: 21 mg/dL (ref 8–23)
CO2: 19 mmol/L — ABNORMAL LOW (ref 22–32)
Calcium: 9.3 mg/dL (ref 8.9–10.3)
Chloride: 108 mmol/L (ref 98–111)
Creatinine, Ser: 0.47 mg/dL (ref 0.44–1.00)
GFR calc Af Amer: >60 mL/min (ref >60)
GFR calc non Af Amer: >60 mL/min (ref >60)
Glucose, Bld: 97 mg/dL (ref 70–99)
Potassium: 4 mmol/L (ref 3.5–5.1)
Sodium: 143 mmol/L (ref 135–145)
Total Bilirubin: 1 mg/dL (ref 0.3–1.2)
Total Protein: 7.4 g/dL (ref 6.5–8.1)

## 2019-09-30 LAB — CBC
HCT: 37.2 % (ref 36.0–46.0)
Hemoglobin: 12.3 g/dL (ref 12.0–15.0)
MCH: 31.9 pg (ref 26.0–34.0)
MCHC: 33.1 g/dL (ref 30.0–36.0)
MCV: 96.6 fL (ref 80.0–100.0)
Platelets: 209 10*3/uL (ref 150–400)
RBC: 3.85 MIL/uL — ABNORMAL LOW (ref 3.87–5.11)
RDW: 14.1 % (ref 11.5–15.5)
WBC: 9.7 10*3/uL (ref 4.0–10.5)
nRBC: 0 % (ref 0.0–0.2)

## 2019-09-30 LAB — CBC WITH DIFFERENTIAL/PLATELET
Abs Immature Granulocytes: 0.04 10*3/uL (ref 0.00–0.07)
Basophils Absolute: 0 10*3/uL (ref 0.0–0.1)
Basophils Relative: 0 %
Eosinophils Absolute: 0 10*3/uL (ref 0.0–0.5)
Eosinophils Relative: 0 %
HCT: 40.7 % (ref 36.0–46.0)
Hemoglobin: 13.2 g/dL (ref 12.0–15.0)
Immature Granulocytes: 0 %
Lymphocytes Relative: 16 %
Lymphs Abs: 1.4 10*3/uL (ref 0.7–4.0)
MCH: 31.9 pg (ref 26.0–34.0)
MCHC: 32.4 g/dL (ref 30.0–36.0)
MCV: 98.3 fL (ref 80.0–100.0)
Monocytes Absolute: 0.6 10*3/uL (ref 0.1–1.0)
Monocytes Relative: 7 %
Neutro Abs: 7 10*3/uL (ref 1.7–7.7)
Neutrophils Relative %: 77 %
Platelets: 228 10*3/uL (ref 150–400)
RBC: 4.14 MIL/uL (ref 3.87–5.11)
RDW: 14 % (ref 11.5–15.5)
WBC: 9.1 10*3/uL (ref 4.0–10.5)
nRBC: 0 % (ref 0.0–0.2)

## 2019-09-30 LAB — LACTIC ACID, PLASMA
Lactic Acid, Venous: 2.3 mmol/L (ref 0.5–1.9)
Lactic Acid, Venous: 2.1 mmol/L (ref 0.5–1.9)
Lactic Acid, Venous: 1.5 mmol/L (ref 0.5–1.9)
Lactic Acid, Venous: 0.8 mmol/L (ref 0.5–1.9)

## 2019-09-30 LAB — CREATININE, SERUM
Creatinine, Ser: 0.39 mg/dL — ABNORMAL LOW (ref 0.44–1.00)
GFR calc Af Amer: >60 mL/min (ref >60)
GFR calc non Af Amer: >60 mL/min (ref >60)

## 2019-09-30 MED ORDER — ONDANSETRON 4 MG PO TBDP
4.0000 mg | ORAL_TABLET | Freq: Three times a day (TID) | ORAL | Status: DC | PRN
  Administered 2019-09-30: 4 mg via ORAL
  Filled 2019-09-30 (×2): qty 1

## 2019-09-30 MED ORDER — MORPHINE SULFATE (PF) 2 MG/ML IV SOLN: 2.0000 mg | INTRAVENOUS | Status: DC | PRN

## 2019-09-30 MED ORDER — METHOCARBAMOL 1000 MG/10ML IJ SOLN
500.0000 mg | Freq: Four times a day (QID) | INTRAVENOUS | Status: DC | PRN
  Filled 2019-09-30: qty 5

## 2019-09-30 MED ORDER — CARBIDOPA-LEVODOPA 25-100 MG PO TABS
2.0000 | ORAL_TABLET | Freq: Four times a day (QID) | ORAL | Status: DC
  Administered 2019-10-01 (×3): 2 via ORAL
  Filled 2019-09-30 (×7): qty 2

## 2019-09-30 MED ORDER — ACETAMINOPHEN 650 MG RE SUPP: 650.0000 mg | Freq: Four times a day (QID) | RECTAL | Status: DC | PRN

## 2019-09-30 MED ORDER — LEVOTHYROXINE SODIUM 88 MCG PO TABS
188.0000 ug | ORAL_TABLET | Freq: Every day | ORAL | Status: DC
  Administered 2019-10-01: 188 ug via ORAL
  Filled 2019-09-30 (×2): qty 1

## 2019-09-30 MED ORDER — SERTRALINE HCL 50 MG PO TABS
50.0000 mg | ORAL_TABLET | Freq: Every day | ORAL | Status: DC
  Administered 2019-10-01: 50 mg via ORAL
  Filled 2019-09-30: qty 1

## 2019-09-30 MED ORDER — MELATONIN 5 MG PO TABS
5.0000 mg | ORAL_TABLET | Freq: Every day | ORAL | Status: DC
  Administered 2019-09-30: 5 mg via ORAL
  Filled 2019-09-30 (×2): qty 1

## 2019-09-30 MED ORDER — PANTOPRAZOLE SODIUM 40 MG PO TBEC
40.0000 mg | DELAYED_RELEASE_TABLET | Freq: Every day | ORAL | Status: DC
  Administered 2019-10-01: 40 mg via ORAL
  Filled 2019-09-30: qty 1

## 2019-09-30 MED ORDER — LORAZEPAM 2 MG/ML IJ SOLN: 1.0000 mg | INTRAMUSCULAR | Status: DC | PRN

## 2019-09-30 MED ORDER — MIRTAZAPINE 15 MG PO TABS
7.5000 mg | ORAL_TABLET | Freq: Every day | ORAL | Status: DC
  Administered 2019-09-30: 7.5 mg via ORAL
  Filled 2019-09-30: qty 1

## 2019-09-30 MED ORDER — INSULIN ASPART 100 UNIT/ML ~~LOC~~ SOLN: 0.0000 [IU] | SUBCUTANEOUS | Status: DC

## 2019-09-30 MED ORDER — CALCIUM CARBONATE-VITAMIN D 500-200 MG-UNIT PO TABS
1.0000 | ORAL_TABLET | Freq: Every day | ORAL | Status: DC
  Administered 2019-10-01: 1 via ORAL
  Filled 2019-09-30 (×2): qty 1

## 2019-09-30 MED ORDER — LACTATED RINGERS IV BOLUS
1000.0000 mL | Freq: Once | INTRAVENOUS | Status: AC
  Administered 2019-09-30: 1000 mL via INTRAVENOUS

## 2019-09-30 MED ORDER — SIMVASTATIN 20 MG PO TABS
40.0000 mg | ORAL_TABLET | Freq: Every day | ORAL | Status: DC
  Administered 2019-09-30: 40 mg via ORAL
  Filled 2019-09-30: qty 2

## 2019-09-30 MED ORDER — SENNA 8.6 MG PO TABS: 1.0000 | ORAL_TABLET | Freq: Every day | ORAL | Status: DC | PRN

## 2019-09-30 MED ORDER — SODIUM CHLORIDE 0.9 % IV SOLN: 1.0000 g | Freq: Two times a day (BID) | INTRAVENOUS | Status: DC

## 2019-09-30 MED ORDER — VANCOMYCIN HCL 500 MG/100ML IV SOLN
500.0000 mg | Freq: Two times a day (BID) | INTRAVENOUS | Status: DC
  Administered 2019-10-01: 500 mg via INTRAVENOUS
  Filled 2019-09-30 (×2): qty 100

## 2019-09-30 MED ORDER — MORPHINE SULFATE (PF) 4 MG/ML IV SOLN
4.0000 mg | Freq: Once | INTRAVENOUS | Status: AC
  Administered 2019-09-30: 4 mg via INTRAVENOUS
  Filled 2019-09-30: qty 1

## 2019-09-30 MED ORDER — ADULT MULTIVITAMIN W/MINERALS CH
1.0000 | ORAL_TABLET | Freq: Every day | ORAL | Status: DC
  Administered 2019-10-01: 1 via ORAL
  Filled 2019-09-30: qty 1

## 2019-09-30 MED ORDER — DOCUSATE SODIUM 100 MG PO CAPS: 100.0000 mg | ORAL_CAPSULE | Freq: Two times a day (BID) | ORAL | Status: DC | PRN

## 2019-09-30 MED ORDER — SODIUM CHLORIDE 0.9 % IV SOLN: INTRAVENOUS | Status: DC

## 2019-09-30 MED ORDER — ACETAMINOPHEN 325 MG PO TABS: 650.0000 mg | ORAL_TABLET | Freq: Four times a day (QID) | ORAL | Status: DC | PRN

## 2019-09-30 MED ORDER — DOCUSATE SODIUM 100 MG PO CAPS
100.0000 mg | ORAL_CAPSULE | Freq: Two times a day (BID) | ORAL | Status: DC
  Administered 2019-09-30 – 2019-10-01 (×2): 100 mg via ORAL
  Filled 2019-09-30 (×2): qty 1

## 2019-09-30 MED ORDER — MORPHINE SULFATE (PF) 2 MG/ML IV SOLN
2.0000 mg | Freq: Once | INTRAVENOUS | Status: AC
  Administered 2019-09-30: 2 mg via INTRAVENOUS
  Filled 2019-09-30: qty 1

## 2019-09-30 MED ORDER — CARBIDOPA-LEVODOPA ER 50-200 MG PO TBCR
1.0000 | EXTENDED_RELEASE_TABLET | Freq: Two times a day (BID) | ORAL | Status: DC
  Administered 2019-10-01 (×2): 1 via ORAL
  Filled 2019-09-30 (×3): qty 1

## 2019-09-30 MED ORDER — CELECOXIB 200 MG PO CAPS
200.0000 mg | ORAL_CAPSULE | Freq: Two times a day (BID) | ORAL | Status: DC | PRN
  Filled 2019-09-30: qty 1

## 2019-09-30 MED ORDER — SODIUM CHLORIDE 0.9 % IV SOLN
2.0000 g | Freq: Two times a day (BID) | INTRAVENOUS | Status: DC
  Administered 2019-10-01 (×2): 2 g via INTRAVENOUS
  Filled 2019-09-30 (×5): qty 2

## 2019-09-30 MED ORDER — CITALOPRAM HYDROBROMIDE 20 MG PO TABS
40.0000 mg | ORAL_TABLET | Freq: Every day | ORAL | Status: DC
  Administered 2019-10-01: 40 mg via ORAL
  Filled 2019-09-30: qty 2

## 2019-09-30 MED ORDER — ENOXAPARIN SODIUM 40 MG/0.4ML ~~LOC~~ SOLN
40.0000 mg | SUBCUTANEOUS | Status: DC
  Administered 2019-09-30: 40 mg via SUBCUTANEOUS
  Filled 2019-09-30: qty 0.4

## 2019-09-30 MED ORDER — ALPRAZOLAM 0.25 MG PO TABS: 0.2500 mg | ORAL_TABLET | Freq: Two times a day (BID) | ORAL | Status: DC | PRN

## 2019-09-30 MED ORDER — ROPINIROLE HCL 1 MG PO TABS
2.0000 mg | ORAL_TABLET | Freq: Every day | ORAL | Status: DC
  Administered 2019-09-30: 2 mg via ORAL
  Filled 2019-09-30 (×2): qty 2

## 2019-09-30 MED ORDER — SODIUM CHLORIDE 0.9 % IV SOLN
2.0000 g | Freq: Once | INTRAVENOUS | Status: AC
  Administered 2019-09-30: 2 g via INTRAVENOUS
  Filled 2019-09-30: qty 2

## 2019-09-30 MED ORDER — VANCOMYCIN HCL 1250 MG/250ML IV SOLN
1250.0000 mg | Freq: Once | INTRAVENOUS | Status: AC
  Administered 2019-09-30: 1250 mg via INTRAVENOUS
  Filled 2019-09-30: qty 250

## 2019-09-30 MED ORDER — GADOBUTROL 1 MMOL/ML IV SOLN
5.0000 mL | Freq: Once | INTRAVENOUS | Status: AC | PRN
  Administered 2019-09-30: 5 mL via INTRAVENOUS
  Filled 2019-09-30: qty 6

## 2019-09-30 NOTE — ED Notes (Signed)
Critical Lactic result given to MD Tamala Julian

## 2019-09-30 NOTE — ED Triage Notes (Signed)
Pt in via POV, reports ongoing pain down spine since recent surgery to C3-4.  Pt resides at [facility]; is unable to recall what she is taking for pain.

## 2019-09-30 NOTE — ED Notes (Signed)
Pt had soaked brief. Pt cleaned up and clean brief placed. Pt placed in clean gown and given warm blankets. Pt pulled up in bed.

## 2019-09-30 NOTE — ED Notes (Signed)
Cultures orders placed after antibiotics. MD still wants cultures collected. Blood cultures collected and sent to lab

## 2019-09-30 NOTE — H&P (Signed)
History and Physical    Theresa Mills:[id number] DOB: [birth year] DOA: 09/30/2019  PCP: Ronnell Freshwater, NP  Patient coming from: SNF   Chief Complaint: Severe back pain  HPI: Theresa Mccauslin Goochis a 69 y.o.femalewith medical history of DM, GERD, anxiety, OSA, Parkinson's with dementia, hypothyroidism, frequent falls who was recently discharged from [facility] hospitalist after being admitted for abdominal pain and neck pain and was found with sepsis secondary to acute pyelonephritis and ultimately was found with L3/L4 discitis/osteomyelitis, right psoas abscess, L3/L4 facet joint arthritis.  She had her abscess aspirated.  Her urine culture and blood cultures were positive for Proteus.  She was ultimately discharged on Rocephin as recommended by infectious disease for total of 6 weeks of therapy.  She had a forearm PICC line placed.  Her Rocephin was supposed to and on 9/13.  Today patient returns complaining of severe back pain which starts from her neck all the way to her leg.  She is riveting in bed with pain even though she was given morphine 2 mg 20 minutes prior to when I saw her.  She is unsure when the pain started.  She thinks she has been getting her antibiotic at the SNF.  She denies any urinary symptoms, chest pain, shortness of breath, diarrhea or abdominal pain.Denies trauma/fall.   ED Course: In the ER her bp 158/92, pulse 108, RA 02 sat 94%, and afebrile.  WBC at 9.7, hemoglobin 12.3 hematocrit 37.2.  Lactic acid initially was 2.3 down to 2.1 CO2 19, anion gap 16.  Was given a dose of vancomycin and cefepime in the ER.  Blood cultures ordered .  ER attending Dr. Tamala Julian spoke to neurosurgery.MRI  Lumbar spine ordered and pending. covid test pending.   Pt does report having both covid vaccinations.   Review of Systems: All systems reviewed and otherwise negative.    Past Medical History:  Diagnosis Date  . Anxiety   . Diabetes ([place])   . Difficult intravenous access   .  GERD (gastroesophageal reflux disease)   . HBP (high blood pressure)   . Hyperlipidemia   . Osteoarthritis   . Parkinson disease ([place])   . Sleep apnea   . Thyroid disease     Past Surgical History:  Procedure Laterality Date  . ABDOMINAL HYSTERECTOMY    . CATARACT EXTRACTION Bilateral   . CHOLECYSTECTOMY    . COLONOSCOPY WITH PROPOFOL N/A 04/02/2015   Procedure: COLONOSCOPY WITH PROPOFOL;  Surgeon: Josefine Class, MD;  Location: [facility] ENDOSCOPY;  Service: Endoscopy;  Laterality: N/A;  . ESOPHAGOGASTRODUODENOSCOPY (EGD) WITH PROPOFOL N/A 04/02/2015   Procedure: ESOPHAGOGASTRODUODENOSCOPY (EGD) WITH PROPOFOL;  Surgeon: Josefine Class, MD;  Location: [facility] ENDOSCOPY;  Service: Endoscopy;  Laterality: N/A;  . GALLBLADDER SURGERY    . LAPAROSCOPIC HYSTERECTOMY    . THYROIDECTOMY Left   . TONSILLECTOMY    . TONSILLECTOMY Bilateral      reports that she has never smoked. She has never used smokeless tobacco. She reports that she does not drink alcohol and does not use drugs.  Allergies  Allergen Reactions  . Codeine Other (See Comments)    HALLUCINATIONS  . Ephadrene [Cholestatin] Other (See Comments)    HYPER AND NERVOUS  . Other Other (See Comments)    HYPER  HYPER AND NERVOUS  . Valium [Diazepam] Other (See Comments)    OVERLY SENSITIVE/TOO STRONG    Family History  Problem Relation Age of Onset  . Asthma Father   . Cancer Sister   .  Stroke Maternal Uncle   . Heart disease Maternal Uncle   . Diabetes Maternal Uncle      Prior to Admission medications   Medication Sig Start Date End Date Taking? Authorizing Provider  ALPRAZolam (XANAX) 0.25 MG tablet Take 1 tablet (0.25 mg total) by mouth 2 (two) times daily as needed for anxiety or sleep. 09/02/19   Barb Merino, MD  Calcium Carbonate-Vitamin D (CALCIUM HIGH POTENCY/VITAMIN D) 600-200 MG-UNIT TABS Take 1 tablet by mouth daily.     [provider]  carbidopa-levodopa (SINEMET CR) 50-200 MG tablet Take  1 tablet by mouth 2 (two) times daily.  07/31/18   [provider]  carbidopa-levodopa (SINEMET IR) 25-100 MG tablet Take 2 tablets by mouth 4 (four) times daily. 07/25/19   [provider]  cefTRIAXone (ROCEPHIN) IVPB Inject 2 g into the vein daily. Indication:  Proteus bacteremia, discitis, psoas abscess, UTI Last Day of Therapy: 10/06/2019 Labs - Once weekly:  CBC/D, CMP, CRP, ESR 09/03/19 10/06/19  Barb Merino, MD  celecoxib (CELEBREX) 200 MG capsule Take 1 capsule (200 mg total) by mouth 2 (two) times daily as needed. 07/29/19   Ronnell Freshwater, NP  citalopram (CELEXA) 40 MG tablet Take 40 mg by mouth daily.    [provider]  docusate sodium (COLACE) 100 MG capsule Take 1 capsule (100 mg total) by mouth 2 (two) times daily. 08/09/19   Fritzi Mandes, MD  glucose blood (ONETOUCH VERIO) test strip TEST BLOOD SUGAR BID 01/27/16   [provider]  levothyroxine (SYNTHROID) 125 MCG tablet Take 1.5 tablets (188 mcg total) by mouth daily at 6 (six) AM. 09/04/19   Barb Merino, MD  Melatonin 3 MG TABS Take by mouth.    [provider]  metFORMIN (GLUCOPHAGE) 500 MG tablet Take 500 mg by mouth 2 (two) times daily with a meal.     [provider]  mirtazapine (REMERON) 7.5 MG tablet Take 1 tablet (7.5 mg total) by mouth at bedtime. 02/10/19   Ronnell Freshwater, NP  Multiple Vitamins-Minerals (MULTIVITAMIN WOMEN 50+ PO) Take 1 tablet by mouth daily.     [provider]  omeprazole (PRILOSEC) 40 MG capsule Take 1 capsule (40 mg total) by mouth daily. 06/12/19   Ronnell Freshwater, NP  ondansetron (ZOFRAN-ODT) 4 MG disintegrating tablet Take 1 tablet (4 mg total) by mouth every 8 (eight) hours as needed for nausea or vomiting. 06/05/19   Ronnell Freshwater, NP  ONETOUCH DELICA LANCETS 17B MISC TEST BLOOD SUGAR BID 01/27/16   [provider]  rOPINIRole (REQUIP XL) 2 MG 24 hr tablet Take 2 mg by mouth at bedtime.     [provider]    sertraline (ZOLOFT) 50 MG tablet Take 1 tablet (50 mg total) by mouth daily. 02/10/19   Ronnell Freshwater, NP  simvastatin (ZOCOR) 40 MG tablet Take 1 tablet (40 mg total) by mouth at bedtime. 04/10/19   Ronnell Freshwater, NP    Physical Exam: Vitals:   09/30/19 1217 09/30/19 1227 09/30/19 1653 09/30/19 1832  BP:  134/90 (!) 155/89 (!) 142/68  Pulse:  94 93 91  Resp:  _0 Temp:  (!) 97.5 F (36.4 C) 98.3 F (36.8 C)   TempSrc:  Oral Axillary   SpO2:  99% 100% 96%  Weight: 54.4 kg     Height: _1  (1.626 m)       Constitutional: NAD, calm, comfortable Vitals:   09/30/19 1217 09/30/19 1227  09/30/19 1653 09/30/19 1832  BP:  134/90 (!) 155/89 (!) 142/68  Pulse:  94 93 91  Resp:  _0 Temp:  (!) 97.5 F (36.4 C) 98.3 F (36.8 C)   TempSrc:  Oral Axillary   SpO2:  99% 100% 96%  Weight: 54.4 kg     Height: _1  (1.626 m)      General : Relating in pain in bed. Eyes: PERRL, lids and conjunctivae normal ENMT: mask on Neck: normal, supple Respiratory: anteriorly as pt refused to move due to back. clear to auscultation bilaterally, no wheezing, no crackles. Normal respiratory effort. No accessory muscle use.  Cardiovascular: Regular rate and rhythm, no murmurs / rubs / gallops. No extremity edema.  Abdomen: no tenderness, no masses palpated. No hepatosplenomegaly. Bowel sounds positive.  Musculoskeletal: no clubbing / cyanosis. No joint deformity upper and lower extremities.  Skin: no rashes, lesions, ulcers. No induration Neurologic: limited as pt was in pain and unable to do much. Strength 5/5 x4 but illicits back pain with leg strength testing. Awake and alert.  Psychiatric: Normal judgment and insight.  Mood and affect appropriate in current setting   Labs on Admission: I have personally reviewed following labs and imaging studies  CBC: Recent Labs  Lab 09/30/19 1450 09/30/19 1829  WBC 9.1 9.7  NEUTROABS 7.0  --   HGB 13.2 12.3  HCT 40.7 37.2  MCV 98.3  96.6  PLT 228 948   Basic Metabolic Panel: Recent Labs  Lab 09/30/19 1450 09/30/19 1829  NA 143  --   K 4.0  --   CL 108  --   CO2 19*  --   GLUCOSE 97  --   BUN 21  --   CREATININE 0.47 0.39*  CALCIUM 9.3  --    GFR: Estimated Creatinine Clearance: 56.2 mL/min (A) (by C-G formula based on SCr of 0.39 mg/dL (L)). Liver Function Tests: Recent Labs  Lab 09/30/19 1450  AST 20  ALT 6  ALKPHOS 72  BILITOT 1.0  PROT 7.4  ALBUMIN 3.6   No results for input(s): LIPASE, AMYLASE in the last 168 hours. No results for input(s): AMMONIA in the last 168 hours. Coagulation Profile: No results for input(s): INR, PROTIME in the last 168 hours. Cardiac Enzymes: No results for input(s): CKTOTAL, CKMB, CKMBINDEX, TROPONINI in the last 168 hours. BNP (last 3 results) No results for input(s): PROBNP in the last 8760 hours. HbA1C: No results for input(s): HGBA1C in the last 72 hours. CBG: No results for input(s): GLUCAP in the last 168 hours. Lipid Profile: No results for input(s): CHOL, HDL, LDLCALC, TRIG, CHOLHDL, LDLDIRECT in the last 72 hours. Thyroid Function Tests: No results for input(s): TSH, T4TOTAL, FREET4, T3FREE, THYROIDAB in the last 72 hours. Anemia Panel: No results for input(s): VITAMINB12, FOLATE, FERRITIN, TIBC, IRON, RETICCTPCT in the last 72 hours. Urine analysis:    Component Value Date/Time   COLORURINE AMBER (A) 08/27/2019 1617   APPEARANCEUR TURBID (A) 08/27/2019 1617   APPEARANCEUR Cloudy (A) 09/05/2017 1535   LABSPEC 1.017 08/27/2019 1617   LABSPEC 1.021 01/13/2014 0737   PHURINE 6.0 08/27/2019 1617   GLUCOSEU NEGATIVE 08/27/2019 1617   GLUCOSEU >=500 01/13/2014 0737   HGBUR LARGE (A) 08/27/2019 1617   BILIRUBINUR NEGATIVE 08/27/2019 1617   BILIRUBINUR negative 06/20/2019 1144   BILIRUBINUR Negative 09/05/2017 1535   BILIRUBINUR Negative 01/13/2014 0737   KETONESUR NEGATIVE 08/27/2019 1617   PROTEINUR 100 (A) 08/27/2019 1617   UROBILINOGEN negative  (  A) 06/20/2019 1144   NITRITE NEGATIVE 08/27/2019 1617   LEUKOCYTESUR LARGE (A) 08/27/2019 1617   LEUKOCYTESUR 3+ 01/13/2014 0737    Radiological Exams on Admission: No results found.   Assessment/Plan Active Problems:   Restless leg syndrome   Parkinson disease (HCC)   Acquired hypothyroidism   Osteomyelitis of lumbar vertebra (HCC)   Discitis   Back pain   Epidural abscess   Psoas abscess, right (HCC)   High anion gap metabolic acidosis    1. Severe back pain- present on admission. With recent Lumbar osteomyelitis/discitis/epidural abscess/right psoas abscess No falls/trauma reported . Discharge recently on ceftriaxone was supposed to complete course on 9/13-we will hold for now Given Vanco and cefepime in ER we will continue Pharmacy consulted for vancomycin dosing ID consult in a.m. as she has been seen in the past Blood cultures obtained in ED MRI pending we will need to follow-up Neurosurgery was consulted by ER who spoke to Dr. Lacinda Axon. N.p.o. at midnight tonight in case she needs to go for any surgical procedures   2.AG metabolic acidosis w/ elevated lactic acidosis- need to rule out infection. However afebrile and no leukocytosis Blood cultures obtained in ER Check urinalysis and urine culture MRI pending Start IV fluid for hydration Repeat lactic acid  3.  Parkinson's disease-continue Sinemet  3.  Hypothyroidism-continue levothyroxine  4.  Leg syndrome-continue with clip  5.DM - on Metformin.  Hold for now , place on RISS, fs, hypoglycemic protocal.    DVT prophylaxis: Lovenox Code Status: Full Family Communication: None at bedside Disposition Plan: Back to previous SNF Consults called: Neurosurgery consulted by ER Dr. Lacinda Axon Admission status: Observation as patient will require less than 2 midnight stays   Nolberto Hanlon MD [facility] Pager [phone]   If 7PM-7AM, please contact night-coverage [email or web address] Password [facility]  09/30/2019, 7:08 PM

## 2019-09-30 NOTE — ED Notes (Signed)
Pharmacy to verify all medications

## 2019-09-30 NOTE — ED Notes (Signed)
First Nurse Note;   Pt brought over from follow up appointment w/ Infectious Disease due to severe pain to upper back since recent surgery.  Pt resides at [facility].  NAD noted at this time.

## 2019-09-30 NOTE — Consult Note (Signed)
PHARMACY -  BRIEF ANTIBIOTIC NOTE   Pharmacy has received consult(s) for vanc and cefepime from an ED provider.  The patient's profile has been reviewed for ht/wt/allergies/indication/available labs.    One time order(s) placed for vancomycin 1250 mg IV x 1 dose and cefepime 2g IV x 1 dose.  Further antibiotics/pharmacy consults should be ordered by admitting physician if indicated.                       Thank you,  Benn Moulder, PharmD Pharmacy Resident  09/30/2019 4:06 PM

## 2019-09-30 NOTE — ED Notes (Signed)
Lactic result informed to MD Tamala Julian

## 2019-09-30 NOTE — Progress Notes (Signed)
Pharmacy Antibiotic Note  Theresa Mills is a 69 y.o. female admitted on 09/30/2019. Pharmacy has been consulted for vancomycin dosing for wound infection.  Plan: Vanc 1250 mg IV x 1 followed by 500 mg IV q12h  Height: 5\' 4"  (162.6 cm) Weight: 54.4 kg (120 lb) IBW/kg (Calculated) : 54.7  Temp (24hrs), Avg:97.9 F (36.6 C), Min:97.5 F (36.4 C), Max:98.3 F (36.8 C)  Recent Labs  Lab 09/30/19 1450 09/30/19 1646 09/30/19 1829  WBC 9.1  --  9.7  CREATININE 0.47  --  0.39*  LATICACIDVEN 2.3* 2.1* 1.5    Estimated Creatinine Clearance: 56.2 mL/min (A) (by C-G formula based on SCr of 0.39 mg/dL (L)).    Allergies  Allergen Reactions  . Codeine Other (See Comments)    HALLUCINATIONS  . Ephadrene [Cholestatin] Other (See Comments)    HYPER AND NERVOUS  . Other Other (See Comments)    HYPER  HYPER AND NERVOUS  . Valium [Diazepam] Other (See Comments)    OVERLY SENSITIVE/TOO STRONG    Antimicrobials this admission: Vanc 9/7 >> Cefepime 9/7 >>   Microbiology results: 9/7 BCx: pending   Thank you for allowing pharmacy to be a part of this patient's care.  Tawnya Crook, PharmD 09/30/2019 6:57 PM

## 2019-09-30 NOTE — ED Notes (Signed)
Admitting md at bedside

## 2019-09-30 NOTE — ED Notes (Signed)
Brought pt belongings and wheel chair up to room 137

## 2019-09-30 NOTE — ED Notes (Signed)
Patient transported to MRI 

## 2019-09-30 NOTE — Progress Notes (Addendum)
Pharmacy note  Cefepime dose changed from 1 g IV q12h to 2 g IV q12h CrCl 56.2 ml/min

## 2019-09-30 NOTE — ED Notes (Signed)
Pt is unable to answer questions. Pt is shaky. Pt was sent from infectious disease for an infection in her back. Pt has a PICC line in the left bicep.

## 2019-09-30 NOTE — ED Notes (Signed)
Attempted to call report

## 2019-09-30 NOTE — ED Provider Notes (Signed)
[facility] Emergency Department Provider Note ____________________________________________   First MD Initiated Contact with Patient 09/30/19 1447     (approximate)  I have reviewed the triage vital signs and the nursing notes.  HISTORY  Chief Complaint Back Pain and Sepsis   HPI Theresa Mills is a 69 y.o. femalewho presents to the ED for evaluation of back pain.   Chart review indicates hx DM, GERD, HLD, Parkinson's disease. Resides at local SNF, [facility].   08/28/2019 diagnosis of L3/4 osteomyelitis and discitis with an epidural abscess.  As well as right psoas abscess.  She had CT-guided drainage and aspiration of the psoas abscess.  Growing Proteus and discharged with 6 weeks of Rocephin.  Neurosurgery managing the patient nonoperatively. Patient primarily wheelchair-bound but can ambulate with a walker with PT. Followed up with midlevel neurosurgery provider 4 days ago.   Patient reports "a few days" of worsening lower back pain that radiates throughout her entire body.  She reports hurting all over.  She reports 9/10 intensity aching pain that radiates throughout her entire body, aching in nature, constant and not improved with Tylenol administration at her SNF.  She reports compliance with ceftriaxone antibiotic ministration via right-sided PICC line, but despite this worsening pain over the past few days.  She denies any red flag symptoms for cord compression, including urinary or stool retention, saddle anesthesias.   History is limited due to patient's distress, dementia at baseline and her being a poor historian.  Past Medical History:  Diagnosis Date  . Anxiety   . Diabetes ([place])   . Difficult intravenous access   . GERD (gastroesophageal reflux disease)   . HBP (high blood pressure)   . Hyperlipidemia   . Osteoarthritis   . Parkinson disease ([place])   . Sleep apnea   . Thyroid disease     Patient Active Problem List   Diagnosis  Date Noted  . Back pain 09/30/2019  . Epidural abscess 09/30/2019  . Psoas abscess, right ([place]) 09/30/2019  . High anion gap metabolic acidosis 13/24/4010  . Lumbar compression fracture ([place]) 08/25/2019  . Osteomyelitis of lumbar vertebra ([place]) 08/25/2019  . Discitis 08/25/2019  . Malnutrition of moderate degree 08/20/2019  . Severe sepsis ([place]) 08/19/2019  . Bilateral hydronephrosis 08/19/2019  . Acute pyelonephritis 08/19/2019  . Acute metabolic encephalopathy 27/25/3664  . Acute urinary retention 08/19/2019  . AKI (acute kidney injury) ([place]) 08/19/2019  . Osteoarthritis of multiple joints   . Frequent falls 08/05/2019  . Urinary tract infection with hematuria 06/11/2019  . Nausea and vomiting 02/13/2019  . Acquired hypothyroidism 02/13/2019  . Acquired bilateral hammer toes 01/23/2019  . Type 2 diabetes mellitus with hyperglycemia ([place]) 09/15/2018  . Encounter for general adult medical examination with abnormal findings 09/05/2017  . Dysuria 09/05/2017  . Uncontrolled type 2 diabetes mellitus with hyperglycemia ([place]) 06/18/2017  . Rib fractures 06/11/2017  . Pressure injury of coccygeal region, stage 2 ([place]) 06/11/2017  . Rhabdomyolysis 06/10/2017  . Elevated troponin 06/10/2017  . Hypernatremia 06/10/2017  . Fall 06/10/2017  . Arthritis 01/08/2017  . Cataracts, bilateral 01/08/2017  . Chickenpox 01/08/2017  . Shingles 01/08/2017  . Depression 01/08/2017  . Diabetes mellitus type 2, uncomplicated ([place]) 40/34/7425  . Hyperlipidemia, unspecified 01/08/2017  . Essential hypertension 01/08/2017  . Migraine headache 01/08/2017  . Thyroid disease 01/08/2017  . Parkinson disease ([place]) 01/08/2017  . Anxiety, generalized 09/01/2016  . Dementia due to Parkinson's disease without behavioral disturbance ([place]) 09/01/2016  .  History of depression 09/01/2016  . Morbid obesity with BMI of 40.0-44.9, adult ([place]) 09/01/2016  . Restless leg syndrome 09/01/2016    Past Surgical  History:  Procedure Laterality Date  . ABDOMINAL HYSTERECTOMY    . CATARACT EXTRACTION Bilateral   . CHOLECYSTECTOMY    . COLONOSCOPY WITH PROPOFOL N/A 04/02/2015   Procedure: COLONOSCOPY WITH PROPOFOL;  Surgeon: Josefine Class, MD;  Location: [facility] ENDOSCOPY;  Service: Endoscopy;  Laterality: N/A;  . ESOPHAGOGASTRODUODENOSCOPY (EGD) WITH PROPOFOL N/A 04/02/2015   Procedure: ESOPHAGOGASTRODUODENOSCOPY (EGD) WITH PROPOFOL;  Surgeon: Josefine Class, MD;  Location: [facility] ENDOSCOPY;  Service: Endoscopy;  Laterality: N/A;  . GALLBLADDER SURGERY    . LAPAROSCOPIC HYSTERECTOMY    . THYROIDECTOMY Left   . TONSILLECTOMY    . TONSILLECTOMY Bilateral     Prior to Admission medications   Medication Sig Start Date End Date Taking? Authorizing Provider  ALPRAZolam (XANAX) 0.25 MG tablet Take 1 tablet (0.25 mg total) by mouth 2 (two) times daily as needed for anxiety or sleep. 09/02/19  Yes Barb Merino, MD  Calcium Carbonate-Vitamin D (CALCIUM HIGH POTENCY/VITAMIN D) 600-200 MG-UNIT TABS Take 1 tablet by mouth daily.    Yes [provider]  carbidopa-levodopa (SINEMET CR) 50-200 MG tablet Take 1 tablet by mouth 2 (two) times daily.  07/31/18  Yes [provider]  carbidopa-levodopa (SINEMET IR) 25-100 MG tablet Take 2 tablets by mouth 4 (four) times daily. 07/25/19  Yes [provider]  celecoxib (CELEBREX) 200 MG capsule Take 1 capsule (200 mg total) by mouth 2 (two) times daily as needed. 07/29/19  Yes Boscia, Greer Ee, NP  citalopram (CELEXA) 40 MG tablet Take 40 mg by mouth daily.   Yes [provider]  docusate sodium (COLACE) 100 MG capsule Take 1 capsule (100 mg total) by mouth 2 (two) times daily. 08/09/19  Yes Fritzi Mandes, MD  levothyroxine (SYNTHROID) 125 MCG tablet Take 1.5 tablets (188 mcg total) by mouth daily at 6 (six) AM. 09/04/19  Yes Barb Merino, MD  Melatonin 3 MG TABS Take 3 mg by mouth at bedtime as needed (sleep).    Yes [provider]  metFORMIN (GLUCOPHAGE) 500 MG tablet Take 500 mg by mouth 2 (two) times daily with a meal.    Yes [provider]  mirtazapine (REMERON) 7.5 MG tablet Take 1 tablet (7.5 mg total) by mouth at bedtime. 02/10/19  Yes Boscia, Greer Ee, NP  Multiple Vitamins-Minerals (MULTIVITAMIN WOMEN 50+ PO) Take 1 tablet by mouth daily.    Yes [provider]  omeprazole (PRILOSEC) 40 MG capsule Take 1 capsule (40 mg total) by mouth daily. 06/12/19  Yes Boscia, Greer Ee, NP  rOPINIRole (REQUIP XL) 2 MG 24 hr tablet Take 2 mg by mouth at bedtime.    Yes [provider]  sertraline (ZOLOFT) 50 MG tablet Take 1 tablet (50 mg total) by mouth daily. 02/10/19  Yes Boscia, Greer Ee, NP  simvastatin (ZOCOR) 40 MG tablet Take 1 tablet (40 mg total) by mouth at bedtime. 04/10/19  Yes Boscia, Greer Ee, NP  traMADol (ULTRAM) 50 MG tablet Take 50 mg by mouth every 6 (six) hours as needed for moderate pain.   Yes [provider]  cefTRIAXone (ROCEPHIN) IVPB Inject 2 g into the vein daily. Indication:  Proteus bacteremia, discitis, psoas abscess, UTI Last Day of Therapy: 10/06/2019 Labs - Once weekly:  CBC/D, CMP, CRP, ESR 09/03/19 10/06/19  Barb Merino, MD  glucose blood (ONETOUCH VERIO) test  strip TEST BLOOD SUGAR BID 01/27/16   [provider]  ondansetron (ZOFRAN-ODT) 4 MG disintegrating tablet Take 1 tablet (4 mg total) by mouth every 8 (eight) hours as needed for nausea or vomiting. 06/05/19   Ronnell Freshwater, NP  ONETOUCH DELICA LANCETS 02H MISC TEST BLOOD SUGAR BID 01/27/16   [provider]    Allergies Codeine, Ephadrene [cholestatin], Other, and Valium [diazepam]  Family History  Problem Relation Age of Onset  . Asthma Father   . Cancer Sister   . Stroke Maternal Uncle   . Heart disease Maternal Uncle   . Diabetes Maternal Uncle     Social History Social History   Tobacco Use  . Smoking status: Never Smoker  . Smokeless tobacco: Never Used  Vaping  Use  . Vaping Use: Never used  Substance Use Topics  . Alcohol use: No  . Drug use: No    Review of Systems  History limited due to patient's dementia, distress and being a poor historian.  Constitutional: No fever/chills Eyes: No visual changes. ENT: No sore throat. Cardiovascular: Denies chest pain. Respiratory: Denies shortness of breath. Gastrointestinal: No abdominal pain.  No nausea, no vomiting.  No diarrhea.  No constipation. Genitourinary: Negative for dysuria. Musculoskeletal: Positive for severe back pain. Skin: Negative for rash. Neurological: Negative for headaches, focal weakness or numbness.   ____________________________________________   PHYSICAL EXAM:  VITAL SIGNS: Vitals:   09/30/19 1653 09/30/19 1832  BP: (!) 155/89 (!) 142/68  Pulse: 93 91  Resp: 20 18  Temp: 98.3 F (36.8 C)   SpO2: 100% 96%      Constitutional: Alert and oriented to person, place and time.  Follows commands in all 4 extremities.  Extremely uncomfortable in bed, writhing around and unable to get comfortable. Examination right after morphine administration yields a slightly better clinical picture and patient is able to roll onto her side with assistance for me to examine her back. Eyes: Conjunctivae are normal. PERRL. EOMI. Head: Atraumatic. Nose: No congestion/rhinnorhea. Mouth/Throat: Mucous membranes are dry.  Oropharynx non-erythematous. Neck: No stridor. No cervical spine tenderness to palpation. Cardiovascular: Normal rate, regular rhythm. Grossly normal heart sounds.  Good peripheral circulation. Respiratory: Normal respiratory effort.  No retractions. Lungs CTAB. Gastrointestinal: Soft , nondistended, nontender to palpation. No abdominal bruits. No CVA tenderness. Musculoskeletal: No lower extremity tenderness nor edema.  No joint effusions. No signs of acute trauma. No spinal step-offs or signs of trauma to the back.  Lumbar back is warm to the touch, but no  fluctuance, induration, laceration or signs of discrete injury. Right-sided PICC in place without erythema, induration or purulence. Neurologic:  Normal speech and language. No gross focal neurologic deficits are appreciated. No gait instability noted. No focal neurologic deficit noted. Skin:  Skin is warm, dry and intact. No rash noted. Psychiatric: Mood and affect are normal. Speech and behavior are normal.  ____________________________________________   LABS (all labs ordered are listed, but only abnormal results are displayed)  Labs Reviewed  LACTIC ACID, PLASMA - Abnormal; Notable for the following components:      Result Value   Lactic Acid, Venous 2.3 (*)    All other components within normal limits  LACTIC ACID, PLASMA - Abnormal; Notable for the following components:   Lactic Acid, Venous 2.1 (*)    All other components within normal limits  COMPREHENSIVE METABOLIC PANEL - Abnormal; Notable for the following components:   CO2 19 (*)    Anion gap 16 (*)  All other components within normal limits  CBC - Abnormal; Notable for the following components:   RBC 3.85 (*)    All other components within normal limits  CREATININE, SERUM - Abnormal; Notable for the following components:   Creatinine, Ser 0.39 (*)    All other components within normal limits  SARS CORONAVIRUS 2 BY RT PCR (HOSPITAL ORDER, [facility])  CULTURE, BLOOD (ROUTINE X 2)  CULTURE, BLOOD (ROUTINE X 2)  CBC WITH DIFFERENTIAL/PLATELET  LACTIC ACID, PLASMA  URINALYSIS, COMPLETE (UACMP) WITH MICROSCOPIC  CBC  LACTIC ACID, PLASMA  CREATININE, SERUM   ____________________________________________  RADIOLOGY  ED MD interpretation: MRI lumbar spine pending at the time of admission.  Official radiology report(s): No results found.  ____________________________________________   PROCEDURES and INTERVENTIONS  Procedure(s) performed (including Critical  Care):  Procedures  Medications  LORazepam (ATIVAN) injection 1 mg (has no administration in time range)  celecoxib (CELEBREX) capsule 200 mg (has no administration in time range)  simvastatin (ZOCOR) tablet 40 mg (has no administration in time range)  ALPRAZolam (XANAX) tablet 0.25 mg (has no administration in time range)  citalopram (CELEXA) tablet 40 mg (has no administration in time range)  mirtazapine (REMERON) tablet 7.5 mg (has no administration in time range)  sertraline (ZOLOFT) tablet 50 mg (has no administration in time range)  levothyroxine (SYNTHROID) tablet 188 mcg (has no administration in time range)  docusate sodium (COLACE) capsule 100 mg (has no administration in time range)  pantoprazole (PROTONIX) EC tablet 40 mg (has no administration in time range)  ondansetron (ZOFRAN-ODT) disintegrating tablet 4 mg (has no administration in time range)  melatonin tablet 5 mg (has no administration in time range)  carbidopa-levodopa (SINEMET CR) 50-200 MG per tablet controlled release 1 tablet (has no administration in time range)  carbidopa-levodopa (SINEMET IR) 25-100 MG per tablet immediate release 2 tablet (has no administration in time range)  rOPINIRole (REQUIP XL) 24 hr tablet 2 mg (has no administration in time range)  Calcium Carbonate-Vitamin D 600-200 MG-UNIT TABS 1 tablet (has no administration in time range)  enoxaparin (LOVENOX) injection 40 mg (has no administration in time range)  0.9 %  sodium chloride infusion ( Intravenous New Bag/Given 09/30/19 1839)  acetaminophen (TYLENOL) tablet 650 mg (has no administration in time range)    Or  acetaminophen (TYLENOL) suppository 650 mg (has no administration in time range)  morphine 2 MG/ML injection 2 mg (has no administration in time range)  methocarbamol (ROBAXIN) 500 mg in dextrose 5 % 50 mL IVPB (has no administration in time range)  docusate sodium (COLACE) capsule 100 mg (has no administration in time range)  senna  (SENOKOT) tablet 8.6 mg (has no administration in time range)  multivitamin with minerals tablet 1 tablet (has no administration in time range)  ceFEPIme (MAXIPIME) 1 g in sodium chloride 0.9 % 100 mL IVPB (has no administration in time range)  vancomycin (VANCOREADY) IVPB 500 mg/100 mL (has no administration in time range)  insulin aspart (novoLOG) injection 0-15 Units (has no administration in time range)  lactated ringers bolus 1,000 mL (0 mLs Intravenous Stopped 09/30/19 1811)  morphine 4 MG/ML injection 4 mg (4 mg Intravenous Given 09/30/19 1636)  vancomycin (VANCOREADY) IVPB 1250 mg/250 mL (0 mg Intravenous Stopped 09/30/19 1834)  ceFEPIme (MAXIPIME) 2 g in sodium chloride 0.9 % 100 mL IVPB (0 g Intravenous Stopped 09/30/19 1723)  morphine 2 MG/ML injection 2 mg (2 mg Intravenous Given 09/30/19 1731)  ____________________________________________   MDM / ED COURSE  69 year old female being treated with prolonged course of IV antibiotics for discitis and psoas abscess, presenting with acutely worsening pain concerning for failure of outpatient medical management and requiring medical admission.  Normal vital signs on room air.  Patient maintained hematoma stability throughout her stay in the ED.  Exam demonstrates an extremely uncomfortable patient requiring recurrent doses of IV opiates due to her acutely worsening lower back pain.  She has no evidence of neurovascular deficits.  She has no red flag symptoms for cord compression.  Blood work with lactic acidosis and metabolic acidosis with a decreased bicarbonate, concerning for acute infectious process.  This is improving with fluid resuscitation.  Blood cultures drawn and patient provided cefepime and vancomycin for broad-spectrum coverage, because I am concerned about worsening infection in the setting of her Rocephin dosing at her SNF.  Consulted neurosurgery who recommends MRI, medical admission and ID consultation.  MRI ordered and patient will  be admitted to hospitalist medicine for further work-up and management and ID consultation.  Clinical Course as of Sep 30 1918  Tue Sep 30, 2019  1702 Reassessed after analgesia.  Patient more comfortable in bed but reporting continued 6/10 intensity lower back pain.  Additional morphine ordered.  Awaiting MRI.   [DS]  43 Called MRI. They report being quite busy, and patient will likely have to wait another 3 hours   [DS]  1732 Spoke with Dr. Lacinda Axon, who recommends ID consultation as well and likely medical management.  He will review patient's chart and look out for MRI.   [DS]  2633 Spoke with admitting hospitalist who agrees to see the patient for admission.   [DS]    Clinical Course User Index [DS] Vladimir Crofts, MD     ____________________________________________   FINAL CLINICAL IMPRESSION(S) / ED DIAGNOSES  Final diagnoses:  Acute midline low back pain without sciatica  Lactic acidosis  Spinal epidural abscess  Psoas abscess ([place])  PICC (peripherally inserted central catheter) in place     ED Discharge Orders    None       Smriti Barkow   Note:  This document was prepared using Dragon voice recognition software and may include unintentional dictation errors.   Vladimir Crofts, MD 09/30/19 [phone]

## 2019-10-01 DIAGNOSIS — D649 Anemia, unspecified: Secondary | ICD-10-CM | POA: Diagnosis not present

## 2019-10-01 DIAGNOSIS — M4646 Discitis, unspecified, lumbar region: Secondary | ICD-10-CM | POA: Diagnosis not present

## 2019-10-01 DIAGNOSIS — M6281 Muscle weakness (generalized): Secondary | ICD-10-CM | POA: Diagnosis not present

## 2019-10-01 DIAGNOSIS — E872 Acidosis: Secondary | ICD-10-CM | POA: Diagnosis not present

## 2019-10-01 DIAGNOSIS — R279 Unspecified lack of coordination: Secondary | ICD-10-CM | POA: Diagnosis not present

## 2019-10-01 DIAGNOSIS — M4626 Osteomyelitis of vertebra, lumbar region: Secondary | ICD-10-CM | POA: Diagnosis not present

## 2019-10-01 DIAGNOSIS — G602 Neuropathy in association with hereditary ataxia: Secondary | ICD-10-CM | POA: Diagnosis not present

## 2019-10-01 DIAGNOSIS — M4647 Discitis, unspecified, lumbosacral region: Secondary | ICD-10-CM | POA: Diagnosis not present

## 2019-10-01 DIAGNOSIS — R7881 Bacteremia: Secondary | ICD-10-CM

## 2019-10-01 DIAGNOSIS — B964 Proteus (mirabilis) (morganii) as the cause of diseases classified elsewhere: Secondary | ICD-10-CM

## 2019-10-01 DIAGNOSIS — R5381 Other malaise: Secondary | ICD-10-CM | POA: Diagnosis not present

## 2019-10-01 LAB — CBC
HCT: 34.2 % — ABNORMAL LOW (ref 36.0–46.0)
Hemoglobin: 10.9 g/dL — ABNORMAL LOW (ref 12.0–15.0)
MCH: 31.7 pg (ref 26.0–34.0)
MCHC: 31.9 g/dL (ref 30.0–36.0)
MCV: 99.4 fL (ref 80.0–100.0)
Platelets: 195 10*3/uL (ref 150–400)
RBC: 3.44 MIL/uL — ABNORMAL LOW (ref 3.87–5.11)
RDW: 14.3 % (ref 11.5–15.5)
WBC: 6.2 10*3/uL (ref 4.0–10.5)
nRBC: 0 % (ref 0.0–0.2)

## 2019-10-01 LAB — C-REACTIVE PROTEIN: CRP: 3.6 mg/dL — ABNORMAL HIGH (ref <1.0)

## 2019-10-01 LAB — CREATININE, SERUM
Creatinine, Ser: 0.31 mg/dL — ABNORMAL LOW (ref 0.44–1.00)
GFR calc Af Amer: >60 mL/min (ref >60)
GFR calc non Af Amer: >60 mL/min (ref >60)

## 2019-10-01 LAB — GLUCOSE, CAPILLARY
Glucose-Capillary: 102 mg/dL — ABNORMAL HIGH (ref 70–99)
Glucose-Capillary: 105 mg/dL — ABNORMAL HIGH (ref 70–99)
Glucose-Capillary: 84 mg/dL (ref 70–99)
Glucose-Capillary: 89 mg/dL (ref 70–99)

## 2019-10-01 LAB — SARS CORONAVIRUS 2 BY RT PCR (HOSPITAL ORDER, PERFORMED IN ~~LOC~~ HOSPITAL LAB): SARS Coronavirus 2: NEGATIVE

## 2019-10-01 LAB — SEDIMENTATION RATE: Sed Rate: 51 mm/hr — ABNORMAL HIGH (ref 0–30)

## 2019-10-01 MED ORDER — CEFTRIAXONE IV (FOR PTA / DISCHARGE USE ONLY): 2.0000 g | INTRAVENOUS | 0 refills | Status: AC

## 2019-10-01 MED ORDER — SODIUM CHLORIDE 0.9 % IV SOLN
2.0000 g | INTRAVENOUS | Status: DC
  Administered 2019-10-01: 2 g via INTRAVENOUS
  Filled 2019-10-01: qty 2
  Filled 2019-10-01: qty 20

## 2019-10-01 MED ORDER — CHLORHEXIDINE GLUCONATE CLOTH 2 % EX PADS
6.0000 | MEDICATED_PAD | Freq: Every day | CUTANEOUS | Status: DC
  Administered 2019-10-01: 6 via TOPICAL

## 2019-10-01 MED ORDER — TRAMADOL HCL 50 MG PO TABS
50.0000 mg | ORAL_TABLET | Freq: Four times a day (QID) | ORAL | 0 refills | Status: DC | PRN
Start: 2019-10-01 — End: 2020-05-11

## 2019-10-01 NOTE — Progress Notes (Signed)
PHARMACY CONSULT NOTE FOR:  OUTPATIENT  PARENTERAL ANTIBIOTIC THERAPY (OPAT)  Indication: Osteomyelitis/Discitis Regimen: Ceftriaxone 2 gm IV q24h End date: 10/13/19  IV antibiotic discharge orders are pended. To discharging provider:  please sign these orders via discharge navigator,  Select New Orders & click on the button choice - Manage This Unsigned Work.     Thank you for allowing pharmacy to be Mills part of this patient's care.  Theresa Mills 10/01/2019, 2:58 PM

## 2019-10-01 NOTE — TOC Initial Note (Signed)
Transition of Care [facility]) - Initial/Assessment Note    Patient Details  Name: Theresa Mills MRN: [id number] Date of Birth: 1950-08-25  [facility] CM/SW Contact:    Shelbie Ammons, RN Phone Number: 10/01/2019, 2:49 PM  Clinical Narrative:    Patient is agreeable to return to SNF at [facility]. Magda Paganini with [facility] contacted and patient will return today she is going to room 404 and number for report is [phone]. RNCM will call EMS for transport.              Patient Goals and CMS Choice        Expected Discharge Plan and Services           Expected Discharge Date: 10/01/19                                    Prior Living Arrangements/Services                       Activities of Daily Living      Permission Sought/Granted                  Emotional Assessment              Admission diagnosis:  Lactic acidosis [E87.2] Spinal epidural abscess [G06.1] Back pain [M54.9] Psoas abscess (HCC) [K68.12] PICC (peripherally inserted central catheter) in place [Z45.2] Acute midline low back pain without sciatica [M54.5] Patient Active Problem List   Diagnosis Date Noted  . Back pain 09/30/2019  . Epidural abscess 09/30/2019  . Psoas abscess, right ([place]) 09/30/2019  . High anion gap metabolic acidosis 09/73/5329  . Lumbar compression fracture ([place]) 08/25/2019  . Osteomyelitis of lumbar vertebra ([place]) 08/25/2019  . Discitis 08/25/2019  . Malnutrition of moderate degree 08/20/2019  . Severe sepsis ([place]) 08/19/2019  . Bilateral hydronephrosis 08/19/2019  . Acute pyelonephritis 08/19/2019  . Acute metabolic encephalopathy 92/42/6834  . Acute urinary retention 08/19/2019  . AKI (acute kidney injury) ([place]) 08/19/2019  . Osteoarthritis of multiple joints   . Frequent falls 08/05/2019  . Urinary tract infection with hematuria 06/11/2019  . Nausea and vomiting 02/13/2019  . Acquired hypothyroidism 02/13/2019  .  Acquired bilateral hammer toes 01/23/2019  . Type 2 diabetes mellitus with hyperglycemia ([place]) 09/15/2018  . Encounter for general adult medical examination with abnormal findings 09/05/2017  . Dysuria 09/05/2017  . Uncontrolled type 2 diabetes mellitus with hyperglycemia ([place]) 06/18/2017  . Rib fractures 06/11/2017  . Pressure injury of coccygeal region, stage 2 ([place]) 06/11/2017  . Rhabdomyolysis 06/10/2017  . Elevated troponin 06/10/2017  . Hypernatremia 06/10/2017  . Fall 06/10/2017  . Arthritis 01/08/2017  . Cataracts, bilateral 01/08/2017  . Chickenpox 01/08/2017  . Shingles 01/08/2017  . Depression 01/08/2017  . Diabetes mellitus type 2, uncomplicated ([place]) 19/62/2297  . Hyperlipidemia, unspecified 01/08/2017  . Essential hypertension 01/08/2017  . Migraine headache 01/08/2017  . Thyroid disease 01/08/2017  . Parkinson disease ([place]) 01/08/2017  . Anxiety, generalized 09/01/2016  . Dementia due to Parkinson's disease without behavioral disturbance ([place]) 09/01/2016  . History of depression 09/01/2016  . Morbid obesity with BMI of 40.0-44.9, adult ([place]) 09/01/2016  . Restless leg syndrome 09/01/2016   PCP:  Ronnell Freshwater, NP Pharmacy:   [facility], [place] - [place] [address] [place] [address] Phone: [phone] Fax: [phone]  [facility]  [place], [place] [address] Phone: [phone] Fax: [phone]     Social Determinants of Health (SDOH) Interventions    Readmission Risk Interventions Readmission Risk Prevention Plan 08/29/2019  Transportation Screening Complete  PCP or Specialist Appt within 3-5 Days Complete  HRI or [place] Complete  Social Work Consult for [place] Planning/Counseling Complete  Palliative Care Screening Not Applicable  Medication Review Press photographer) Complete  Some recent data might be hidden

## 2019-10-01 NOTE — Consult Note (Addendum)
Referring Physician:  No referring provider defined for this encounter.  Primary Physician:  Ronnell Freshwater, NP  Chief Complaint:  Lower back pain, osteomyelitis/discitis  History of Present Illness: Theresa Mills is a 69 y.o. female admitted to the [facility] hospital for worsening lower back pain.   She has known osteomyelitis/discitis at L3/4, R psoas abscess, she was treated at [facility] in August, her abscess was aspirated and she was discharged to [facility] on Rocephin for a total of 6 weeks.  I saw her last week in follow up of her noted L3 and L4 compression fractures, for which we recommended a TLSO brace (prior, chronic T12 compression fracture). I personally spoke to the PT at [facility] last week and he said she had been ambulating with a walker.   On exam, she remains full strength and denies RLE radicular pain at rest. She has baseline urinary retention which is not new, and denies any saddle anesthesia, bowel dysfunction. Denies any extremity weakness.   She has had a repeat lumbar MRI on this admission which shows progression of endplate irregularity at L3/4, findings concerning for septic arthritis at this level, as well as known, chronic anterolisthesis at L5-S1.  Neurosurgery was consulted given the osteomyelitis/discitis and lower back pain.   Interval History 08/28/2019: "Theresa Mills is a 69 y.o. ill-appearing female who is currently admitted to [facility] with a diagnosis of osteomyelitis and discitis.  Per Dr. Serita Grit note from yesterday, 08/27/2019 "Theresa Mills a 69 y.o.femalewith medical history of DM, GERD, anxiety, OSA, Parkinson's with dementia, hypothyroidism, frequent falls, brought in 08/19/19 by EMS after SNF staff called due to shortness of breath and altered mental status".  While hospitalized, she did have an MRI which showed L3-L4 osteomyelitis and discitis as well as epidural abscess.  Additionally, she was shown to have a right psoas abscess.  ID  had been consulted, and IR completed a CT-guided aspiration of the psoas abscess. Cultures of the psoas abscess appear to be growing Proteus.  IDs current recommendations are to continue on Rocephin x6 weeks.  Notably, her MRI also showed a previously identified compression fracture at the superior endplate of L4 and subacute compression fracture extending through the inferior endplate of L3.   Theresa Mills is a poor historian, however she is able to cooperate in her exam and follow commands, as well as to report that she is having severe pain in her right hip down through her right leg and thigh.  Her lower extremity motor exam is intact however, her right lower extremity exam is severely limited by right hip and thigh pain. She denies numbness and tingling to lower extremities.  She was noted to have urinary retention on admission however her MRI showed a normal conus medullaris as well as normal cauda equina.  A urology consult was placed and per their note, "Given her history of Parkinson's dementia and diabetes, she is at increased risk of neurogenic bladder. Unclear if her current presentation reflects acute urinary retention in the setting of infection versus acute on chronic retention with underlying neurogenic bladder". Her rectal tone is intact and she denies any saddle anesthesia.   She reports that she does not get OOB due to pain prior to being hospitalized."   Theresa Mills has no symptoms of cervical myelopathy.   Review of Systems:  A 10 point review of systems is negative, except for the pertinent positives and negatives detailed in the HPI.  Past Medical History: Past  Medical History:  Diagnosis Date  . Anxiety   . Diabetes ([place])   . Difficult intravenous access   . GERD (gastroesophageal reflux disease)   . HBP (high blood pressure)   . Hyperlipidemia   . Osteoarthritis   . Parkinson disease ([place])   . Sleep apnea   . Thyroid disease     Past Surgical  History: Past Surgical History:  Procedure Laterality Date  . ABDOMINAL HYSTERECTOMY    . CATARACT EXTRACTION Bilateral   . CHOLECYSTECTOMY    . COLONOSCOPY WITH PROPOFOL N/A 04/02/2015   Procedure: COLONOSCOPY WITH PROPOFOL;  Surgeon: Josefine Class, MD;  Location: [facility] ENDOSCOPY;  Service: Endoscopy;  Laterality: N/A;  . ESOPHAGOGASTRODUODENOSCOPY (EGD) WITH PROPOFOL N/A 04/02/2015   Procedure: ESOPHAGOGASTRODUODENOSCOPY (EGD) WITH PROPOFOL;  Surgeon: Josefine Class, MD;  Location: [facility] ENDOSCOPY;  Service: Endoscopy;  Laterality: N/A;  . GALLBLADDER SURGERY    . LAPAROSCOPIC HYSTERECTOMY    . THYROIDECTOMY Left   . TONSILLECTOMY    . TONSILLECTOMY Bilateral     Allergies: Allergies as of 09/30/2019 - Review Complete 09/30/2019  Allergen Reaction Noted  . Codeine Other (See Comments) 11/20/2012  . Ephadrene [cholestatin] Other (See Comments) 11/20/2012  . Other Other (See Comments) 11/20/2012  . Valium [diazepam] Other (See Comments) 11/20/2012    Medications:  Current Facility-Administered Medications:  .  0.9 %  sodium chloride infusion, , Intravenous, Continuous, Nolberto Hanlon, MD, Last Rate: 75 mL/hr at 10/01/19 0400, Rate Verify at 10/01/19 0400 .  acetaminophen (TYLENOL) tablet 650 mg, 650 mg, Oral, Q6H PRN **OR** acetaminophen (TYLENOL) suppository 650 mg, 650 mg, Rectal, Q6H PRN, Kurtis Bushman, Sahar, MD .  ALPRAZolam Duanne Moron) tablet 0.25 mg, 0.25 mg, Oral, BID PRN, Nolberto Hanlon, MD .  calcium-vitamin D (OSCAL WITH D) 500-200 MG-UNIT per tablet 1 tablet, 1 tablet, Oral, Daily, Amery, Sahar, MD .  carbidopa-levodopa (SINEMET CR) 50-200 MG per tablet controlled release 1 tablet, 1 tablet, Oral, BID, Nolberto Hanlon, MD, 1 tablet at 10/01/19 0058 .  carbidopa-levodopa (SINEMET IR) 25-100 MG per tablet immediate release 2 tablet, 2 tablet, Oral, QID, Nolberto Hanlon, MD, 2 tablet at 10/01/19 0057 .  ceFEPIme (MAXIPIME) 2 g in sodium chloride 0.9 % 100 mL IVPB, 2 g, Intravenous, Q12H,  Rocky Morel, RPH, Stopped at 10/01/19 0044 .  celecoxib (CELEBREX) capsule 200 mg, 200 mg, Oral, BID PRN, Nolberto Hanlon, MD .  Chlorhexidine Gluconate Cloth 2 % PADS 6 each, 6 each, Topical, Daily, Amery, Sahar, MD .  citalopram (CELEXA) tablet 40 mg, 40 mg, Oral, Daily, Amery, Sahar, MD .  docusate sodium (COLACE) capsule 100 mg, 100 mg, Oral, BID, Kurtis Bushman, Sahar, MD, 100 mg at 09/30/19 2314 .  docusate sodium (COLACE) capsule 100 mg, 100 mg, Oral, BID PRN, Nolberto Hanlon, MD .  enoxaparin (LOVENOX) injection 40 mg, 40 mg, Subcutaneous, Q24H, Amery, Sahar, MD, 40 mg at 09/30/19 2316 .  insulin aspart (novoLOG) injection 0-15 Units, 0-15 Units, Subcutaneous, Q4H, Amery, Sahar, MD .  levothyroxine (SYNTHROID) tablet 188 mcg, 188 mcg, Oral, QAC breakfast, Nolberto Hanlon, MD, 188 mcg at 10/01/19 0604 .  LORazepam (ATIVAN) injection 1 mg, 1 mg, Intravenous, PRN, Kurtis Bushman, Sahar, MD .  melatonin tablet 5 mg, 5 mg, Oral, QHS, Amery, Sahar, MD, 5 mg at 09/30/19 2315 .  methocarbamol (ROBAXIN) 500 mg in dextrose 5 % 50 mL IVPB, 500 mg, Intravenous, Q6H PRN, Kurtis Bushman, Sahar, MD .  mirtazapine (REMERON) tablet 7.5 mg, 7.5 mg, Oral, QHS, Amery, Sahar, MD, 7.5 mg  at 09/30/19 2315 .  morphine 2 MG/ML injection 2 mg, 2 mg, Intravenous, Q2H PRN, Nolberto Hanlon, MD .  multivitamin with minerals tablet 1 tablet, 1 tablet, Oral, Daily, Amery, Sahar, MD .  ondansetron (ZOFRAN-ODT) disintegrating tablet 4 mg, 4 mg, Oral, Q8H PRN, Nolberto Hanlon, MD, 4 mg at 09/30/19 2317 .  pantoprazole (PROTONIX) EC tablet 40 mg, 40 mg, Oral, Daily, Amery, Sahar, MD .  rOPINIRole (REQUIP) tablet 2 mg, 2 mg, Oral, QHS, Nolberto Hanlon, MD, 2 mg at 09/30/19 2315 .  senna (SENOKOT) tablet 8.6 mg, 1 tablet, Oral, Daily PRN, Nolberto Hanlon, MD .  sertraline (ZOLOFT) tablet 50 mg, 50 mg, Oral, Daily, Amery, Sahar, MD .  simvastatin (ZOCOR) tablet 40 mg, 40 mg, Oral, QHS, Amery, Sahar, MD, 40 mg at 09/30/19 2315 .  vancomycin (VANCOREADY) IVPB 500 mg/100  mL, 500 mg, Intravenous, Q12H, Amery, Sahar, MD, Last Rate: 100 mL/hr at 10/01/19 0613, 500 mg at 10/01/19 5277   Social History: Social History   Tobacco Use  . Smoking status: Never Smoker  . Smokeless tobacco: Never Used  Vaping Use  . Vaping Use: Never used  Substance Use Topics  . Alcohol use: No  . Drug use: No    Family Medical History: Family History  Problem Relation Age of Onset  . Asthma Father   . Cancer Sister   . Stroke Maternal Uncle   . Heart disease Maternal Uncle   . Diabetes Maternal Uncle     Physical Examination: Vitals:   10/01/19 0209 10/01/19 0553  BP: 123/64 103/60  Pulse: 73 69  Resp: 15 17  Temp: 98.3 F (36.8 C) 98 F (36.7 C)  SpO2: 97% 94%     General: Patient is well developed, well nourished, calm, collected, and in no apparent distress.  Reports minimal pain at rest.   Psychiatric: Patient is non-anxious.  NEUROLOGICAL:  General: In no acute distress.   Awake, alert, oriented to person, place, and time.  Pupils equal round and reactive to light.  Facial tone is symmetric.  Tongue protrusion is midline.  There is no pronator drift.  ROM of spine: deferred as pt in bed  Palpation of spine: non tender to palpation.    Strength: full strength to bilateral upper extremities  Side Iliopsoas Quads Hamstring PF DF EHL  R 5 5 5 5 5 5   L 5 5 5 5 5 5    Reflexes are 1+ and symmetric at the biceps, triceps, brachioradialis, patella and achilles.   Bilateral upper and lower extremity sensation is intact to light touch.  Hoffman's is absent.  Imaging: CLINICAL DATA:  Initial evaluation for worsening lower back pain, history of osteomyelitis discitis.  EXAM: MRI LUMBAR SPINE WITHOUT AND WITH CONTRAST 09/30/2019  TECHNIQUE: Multiplanar and multiecho pulse sequences of the lumbar spine were obtained without and with intravenous contrast.  CONTRAST:  58mL GADAVIST GADOBUTROL 1 MMOL/ML IV SOLN  COMPARISON:  Prior MRI from  08/28/2019.  FINDINGS: Segmentation:  Standard.  Alignment: Moderate levoscoliosis. Chronic bilateral pars defects at L5 with associated 12 mm spondylolisthesis. Trace 3 mm retrolisthesis of L1 on L2. Appearance is stable from prior.  Vertebrae: Persistent changes consistent with osteomyelitis discitis seen centered about the L3-4 interspace. Persistent abnormal marrow edema and enhancement seen within the L3 and L4 vertebral bodies, with fluid signal intensity and enhancement within the intervening L3-4 disc space. Superimposed compression deformities involving the inferior endplate of L3 and superior endplate of L4 again noted with similar height  loss, although endplate irregularity has progressed from prior. Abnormal edema and enhancement again seen extending into the adjacent right greater than left psoas musculature. Eight pigtail drainage catheter previously positioned within the right psoas muscle has been removed. There is a residual small curvilinear collection involving the right psoas muscle at the level of L3-4, measuring up to 1.7 cm in greatest size (series 10, images 24-22). Abnormal edema and enhancement seen about the bilateral L3-4 facets, concerning for associated septic arthritis, right worse than left. Associated edema and enhancement within the adjacent posterior paraspinous soft tissues without definite discrete or drainable fluid collection. Irregular epidural phlegmon/enhancement seen involving the epidural space in a fairly circumferential fashion at the level of L3-4. No definite loculated or discrete epidural abscess now seen on today's exam.  Mild chronic compression deformity involving the superior endplate of G64 again seen, with no associated marrow edema seen on today's exam, consistent with a chronic fracture. Otherwise, vertebral body height maintained. Underlying bone marrow signal intensity within normal limits. Multiple scattered benign  hemangiomata again noted. No worrisome osseous lesions.  Conus medullaris and cauda equina: Conus extends to the L2-3 level. Conus and cauda equina appear normal.  Paraspinal and other soft tissues: Bilateral psoas edema and enhancement adjacent to the L3-4 interspace, consistent with associated myositis. Small residual right psoas collection as above. No definite loculated left-sided collections now seen  Disc levels:  L1-2: Diffuse disc bulge, eccentric to the right, with prominent right-sided reactive endplate spurring. Resultant mild narrowing of the right lateral recess. Central canal remains patent. No significant foraminal stenosis. Appearance is stable.  L2-3: Advanced degenerative intervertebral disc space narrowing with diffuse disc bulge, asymmetric to the right. Prominent right-sided reactive endplate changes. Moderate bilateral facet hypertrophy. Resultant moderate right lateral recess narrowing with moderate right foraminal stenosis. Mild to moderate left lateral recess stenosis with mild left foraminal narrowing noted as well. Overall, appearance is relatively stable.  L3-4: Findings consistent with osteomyelitis discitis with abnormal edema, enhancement, and endplate irregularity about the L3-4 interspace. Superimposed changes suspicious for right greater than left septic arthritis present as well. Irregular circumferential epidural phlegmon and enhancement without definite discrete epidural abscess. Resultant moderate spinal stenosis with moderate to severe bilateral lateral recess narrowing, worsened from previous. Epidural phlegmon and enhancement extends to involve the bilateral neural foramina with associated moderate to severe bilateral foraminal stenosis, also worsened.  L4-5: Disc desiccation with mild left far lateral disc bulge. Mild facet hypertrophy. No significant stenosis.  L5-S1: Chronic bilateral pars defects with associated 12  mm spondylolisthesis. Associated broad posterior pseudo disc bulge/uncovering. Moderate left greater than right facet degeneration. No spinal stenosis. Moderate left L5 foraminal narrowing again noted. Right neural foramen remains patent.  IMPRESSION: 1. Persistent findings consistent with osteomyelitis discitis centered about the L3-4 interspace. Associated abnormal enhancement within the adjacent paraspinous soft tissues, with residual 1.7 cm right psoas collection/abscess. Associated epidural phlegmon/enhancement with no definite discrete epidural abscess now visualized. Associated moderate to severe spinal stenosis with bilateral lateral recess and foraminal narrowing at this level has progressed and worsened from previous. 2. Abnormal edema and enhancement about the bilateral L3-4 facets, concerning for associated septic arthritis, right worse than left. 3. Underlying fracture deformities involving the inferior endplate of L3 and superior endplate of L4, similar to previous, although associated endplate irregularity related to underlying infection has progressed since prior. 4. Chronic bilateral pars defects at L5-S1 with associated 12 mm spondylolisthesis, with moderate left L5 foraminal stenosis.   Assessment and Plan:  Ms. Preston is a pleasant 68 y.o. female with continued L3-4 osteomyelitis and discitis. She also has known L3 and L4 compression fractures.   Her exam is reassuring in that she remains full strength, has minimal pain at rest, and is without lumbar radiculopathy during my exam.   There is no acute neurosurgical intervention indicated at this time.  We recommend continued management of infection by ID.  Regarding her compression fractures, please place her in a TLSO brace when OOB. She can continue to follow up with me nonoperatively in clinic for these fractures.   Feel free to contact us with any further questions.  Lonell Face, NP Dept. of  Neurosurgery

## 2019-10-01 NOTE — NC FL2 (Signed)
[place] LEVEL OF CARE SCREENING TOOL     IDENTIFICATION  Patient Name: Theresa Mills Birthdate: 1950-03-26 Sex: female Admission Date (Current Location): 09/30/2019  [facility] and [place] Number:      Facility and Address:  [facility], [address] [place],  [address]      Provider Number:    Attending Physician Name and Address:  Sharen Hones, MD  Relative Name and Phone Number:       Current Level of Care:   Recommended Level of Care: [place] Prior Approval Number:    Date Approved/Denied:   PASRR Number:    Discharge Plan:      Current Diagnoses: Patient Active Problem List   Diagnosis Date Noted   Back pain 09/30/2019   Epidural abscess 09/30/2019   Psoas abscess, right ([place]) 09/30/2019   High anion gap metabolic acidosis 56/31/4970   Lumbar compression fracture ([place]) 08/25/2019   Osteomyelitis of lumbar vertebra ([place]) 08/25/2019   Discitis 08/25/2019   Malnutrition of moderate degree 08/20/2019   Severe sepsis ([place]) 08/19/2019   Bilateral hydronephrosis 08/19/2019   Acute pyelonephritis 26/37/8588   Acute metabolic encephalopathy 50/27/7412   Acute urinary retention 08/19/2019   AKI (acute kidney injury) ([place]) 08/19/2019   Osteoarthritis of multiple joints    Frequent falls 08/05/2019   Urinary tract infection with hematuria 06/11/2019   Nausea and vomiting 02/13/2019   Acquired hypothyroidism 02/13/2019   Acquired bilateral hammer toes 01/23/2019   Type 2 diabetes mellitus with hyperglycemia ([place]) 09/15/2018   Encounter for general adult medical examination with abnormal findings 09/05/2017   Dysuria 09/05/2017   Uncontrolled type 2 diabetes mellitus with hyperglycemia ([place]) 06/18/2017   Rib fractures 06/11/2017   Pressure injury of coccygeal region, stage 2 ([place]) 06/11/2017   Rhabdomyolysis 06/10/2017   Elevated troponin 06/10/2017   Hypernatremia  06/10/2017   Fall 06/10/2017   Arthritis 01/08/2017   Cataracts, bilateral 01/08/2017   Chickenpox 01/08/2017   Shingles 01/08/2017   Depression 01/08/2017   Diabetes mellitus type 2, uncomplicated ([place]) 87/86/7672   Hyperlipidemia, unspecified 01/08/2017   Essential hypertension 01/08/2017   Migraine headache 01/08/2017   Thyroid disease 01/08/2017   Parkinson disease ([place]) 01/08/2017   Anxiety, generalized 09/01/2016   Dementia due to Parkinson's disease without behavioral disturbance ([place]) 09/01/2016   History of depression 09/01/2016   Morbid obesity with BMI of 40.0-44.9, adult ([place]) 09/01/2016   Restless leg syndrome 09/01/2016    Orientation RESPIRATION BLADDER Height & Weight     Situation, Time, Self  Normal   Weight: 54.4 kg Height:  5\' 4"  (162.6 cm)  BEHAVIORAL SYMPTOMS/MOOD NEUROLOGICAL BOWEL NUTRITION STATUS      Incontinent Diet (Heart Healthy, Carb Modified)  AMBULATORY STATUS COMMUNICATION OF NEEDS Skin   Extensive Assist   Normal                       Personal Care Assistance Level of Assistance  Dressing, Feeding, Bathing   Feeding assistance: Limited assistance Dressing Assistance: Maximum assistance     Functional Limitations Info  Speech, Hearing, Sight   Hearing Info: Adequate Speech Info: Adequate    SPECIAL CARE FACTORS FREQUENCY  PT (By licensed PT), OT (By licensed OT)                    Contractures Contractures Info: Not present    Additional Factors Info  Code Status, Allergies Code Status Info: Full Allergies  Info: Codeine, Valium, Ephadrene           Current Medications (10/01/2019):  This is the current hospital active medication list Current Facility-Administered Medications  Medication Dose Route Frequency Provider Last Rate Last Admin   0.9 %  sodium chloride infusion   Intravenous Continuous Nolberto Hanlon, MD 75 mL/hr at 10/01/19 0400 Rate Verify at 10/01/19 0400   acetaminophen (TYLENOL)  tablet 650 mg  650 mg Oral Q6H PRN Nolberto Hanlon, MD       Or   acetaminophen (TYLENOL) suppository 650 mg  650 mg Rectal Q6H PRN Nolberto Hanlon, MD       ALPRAZolam Duanne Moron) tablet 0.25 mg  0.25 mg Oral BID PRN Nolberto Hanlon, MD       calcium-vitamin D (OSCAL WITH D) 500-200 MG-UNIT per tablet 1 tablet  1 tablet Oral Daily Nolberto Hanlon, MD   1 tablet at 10/01/19 0847   carbidopa-levodopa (SINEMET CR) 50-200 MG per tablet controlled release 1 tablet  1 tablet Oral BID Nolberto Hanlon, MD   1 tablet at 10/01/19 0848   carbidopa-levodopa (SINEMET IR) 25-100 MG per tablet immediate release 2 tablet  2 tablet Oral QID Nolberto Hanlon, MD   2 tablet at 10/01/19 1346   cefTRIAXone (ROCEPHIN) 2 g in sodium chloride 0.9 % 100 mL IVPB  2 g Intravenous Q24H Ravishankar, Joellyn Quails, MD 200 mL/hr at 10/01/19 1248 2 g at 10/01/19 1248   celecoxib (CELEBREX) capsule 200 mg  200 mg Oral BID PRN Nolberto Hanlon, MD       Chlorhexidine Gluconate Cloth 2 % PADS 6 each  6 each Topical Daily Nolberto Hanlon, MD   6 each at 10/01/19 1343   citalopram (CELEXA) tablet 40 mg  40 mg Oral Daily Nolberto Hanlon, MD   40 mg at 10/01/19 0846   docusate sodium (COLACE) capsule 100 mg  100 mg Oral BID Nolberto Hanlon, MD   100 mg at 10/01/19 0846   docusate sodium (COLACE) capsule 100 mg  100 mg Oral BID PRN Nolberto Hanlon, MD       enoxaparin (LOVENOX) injection 40 mg  40 mg Subcutaneous Q24H Amery, Sahar, MD   40 mg at 09/30/19 2316   insulin aspart (novoLOG) injection 0-15 Units  0-15 Units Subcutaneous Q4H Nolberto Hanlon, MD       levothyroxine (SYNTHROID) tablet 188 mcg  188 mcg Oral QAC breakfast Nolberto Hanlon, MD   188 mcg at 10/01/19 0604   LORazepam (ATIVAN) injection 1 mg  1 mg Intravenous PRN Nolberto Hanlon, MD       melatonin tablet 5 mg  5 mg Oral QHS Nolberto Hanlon, MD   5 mg at 09/30/19 2315   methocarbamol (ROBAXIN) 500 mg in dextrose 5 % 50 mL IVPB  500 mg Intravenous Q6H PRN Nolberto Hanlon, MD       mirtazapine (REMERON)  tablet 7.5 mg  7.5 mg Oral QHS Nolberto Hanlon, MD   7.5 mg at 09/30/19 2315   morphine 2 MG/ML injection 2 mg  2 mg Intravenous Q2H PRN Nolberto Hanlon, MD       multivitamin with minerals tablet 1 tablet  1 tablet Oral Daily Nolberto Hanlon, MD   1 tablet at 10/01/19 0848   ondansetron (ZOFRAN-ODT) disintegrating tablet 4 mg  4 mg Oral Q8H PRN Nolberto Hanlon, MD   4 mg at 09/30/19 2317   pantoprazole (PROTONIX) EC tablet 40 mg  40 mg Oral Daily Nolberto Hanlon, MD   40 mg at 10/01/19 [phone]  rOPINIRole (REQUIP) tablet 2 mg  2 mg Oral QHS Nolberto Hanlon, MD   2 mg at 09/30/19 2315   senna (SENOKOT) tablet 8.6 mg  1 tablet Oral Daily PRN Nolberto Hanlon, MD       sertraline (ZOLOFT) tablet 50 mg  50 mg Oral Daily Nolberto Hanlon, MD   50 mg at 10/01/19 0847   simvastatin (ZOCOR) tablet 40 mg  40 mg Oral QHS Nolberto Hanlon, MD   40 mg at 09/30/19 2315     Discharge Medications: Please see discharge summary for a list of discharge medications.  Relevant Imaging Results:  Relevant Lab Results:   Additional Information SSN [id number]  Shelbie Ammons, RN

## 2019-10-01 NOTE — Progress Notes (Signed)
Patient is stable and ready for discharge back to [facility]. Patient is discharging with Rt upper arm PICC line saline locked. Writer called report to Caryl Pina, RN receiving nurse for patient, verbalized understanding and hard script prescription placed in discharge packet. Patient was transported via EMS, with patient's belonging bag.

## 2019-10-01 NOTE — Evaluation (Signed)
Physical Therapy Evaluation Patient Details Name: Theresa Mills MRN: [id number] DOB: Dec 15, 1950 Today's Date: 10/01/2019   History of Present Illness  Theresa Mills is a 68 y.o. female with medical history of DM, GERD, anxiety, OSA, Parkinson's with dementia, hypothyroidism, frequent falls, brought in from neurosurgical appointment due to recent progression in low back pain. Pt admitted 4 weeks ago workup showed lumbar osteomyelitis and discitis, epidural abscess and right psoas abscess (s/p drain placed) and compression fracture at the superior endplate of L4, pt has been at [facility] for rehab adn IV ABX.  Clinical Impression  Pt admitted with above diagnosis. Pt currently with functional limitations due to the deficits listed below (see "PT Problem List"). Pt requires MaxA+2 for bed mobility, educated on log roll technique to protect back, which patient does not recall having learned before. Upon sitting, pt has rapid pain increase and LLE charlie horse, quickly throwing her trunk back into the bed. In supine patient has 3/5 or greater grossly in BLE. Unable to attempt transfers due to severe pain, as well as no TLSO in room as ordered by neurosurgical. Spoke to [place] in rehab at [facility], who reports receiving verbal order for brace, but that they are unable to obtain TLSO for patients there. RN/neurosurgical made aware that this order has not been filled. Pt assisted back to supine with Mod-totalA+2. Functional mobility assessment demonstrates increased effort/time requirements, poor tolerance, and need for physical assistance, whereas the patient performed these at a similar level of independence PTA over the past 4 weeks. Pt will benefit from skilled PT intervention to increase independence and safety with basic mobility in preparation for discharge to the venue listed below.       Follow Up Recommendations SNF    Equipment Recommendations  Other (comment) (TLSO per neurosurgical)     Recommendations for Other Services       Precautions / Restrictions Precautions Precautions: Fall;Back Precaution Booklet Issued: No Required Braces or Orthoses: Spinal Brace Spinal Brace: Thoracolumbosacral orthotic;Applied in sitting position      Mobility  Bed Mobility Overal bed mobility: Needs Assistance Bed Mobility: Supine to Sit;Sit to Supine     Supine to sit: Max assist;+2 for physical assistance Sit to supine: Mod assist;+2 for safety/equipment   General bed mobility comments: educated on log-roll techinique, pt reports never having done this before.  Transfers                 General transfer comment: unable to attempt; pt does not tolerate sitting at EOB >15 seconds, impulsively throws herself back into attempted supine  Ambulation/Gait                Stairs            Wheelchair Mobility    Modified Rankin (Stroke Patients Only)       Balance                                             Pertinent Vitals/Pain Pain Assessment: 0-10 Pain Score: 5  Pain Location: LBP; has LLE charlie horse with attempt at mobility Pain Intervention(s): Limited activity within patient's tolerance;Repositioned;Premedicated before session    [place] expects to be discharged to:: Skilled nursing facility                      Prior Function  Hand Dominance        Extremity/Trunk Assessment   Upper Extremity Assessment Upper Extremity Assessment: Generalized weakness    Lower Extremity Assessment Lower Extremity Assessment: Generalized weakness    Cervical / Trunk Assessment Cervical / Trunk Assessment: Kyphotic  Communication      Cognition Arousal/Alertness: Awake/alert Behavior During Therapy: WFL for tasks assessed/performed Overall Cognitive Status: Within Functional Limits for tasks assessed                                        General  Comments      Exercises     Assessment/Plan    PT Assessment Patient needs continued PT services  PT Problem List Decreased strength;Decreased activity tolerance;Decreased mobility;Decreased safety awareness;Decreased knowledge of precautions       PT Treatment Interventions DME instruction;Gait training;Functional mobility training;Stair training;Therapeutic activities;Therapeutic exercise;Patient/family education    PT Goals (Current goals can be found in the Care Plan section)  Acute Rehab PT Goals Patient Stated Goal: improve pain PT Goal Formulation: With patient Time For Goal Achievement: 10/15/19 Potential to Achieve Goals: Fair    Frequency Min 2X/week   Barriers to discharge        Co-evaluation               AM-PAC PT "6 Clicks" Mobility  Outcome Measure Help needed turning from your back to your side while in a flat bed without using bedrails?: Total Help needed moving from lying on your back to sitting on the side of a flat bed without using bedrails?: Total Help needed moving to and from a bed to a chair (including a wheelchair)?: Total Help needed standing up from a chair using your arms (e.g., wheelchair or bedside chair)?: Total Help needed to walk in hospital room?: Total Help needed climbing 3-5 steps with a railing? : Total 6 Click Score: 6    End of Session   Activity Tolerance: Patient limited by pain Patient left: in bed;with bed alarm set;with call bell/phone within reach Nurse Communication: Mobility status;Precautions PT Visit Diagnosis: Unsteadiness on feet (R26.81);Muscle weakness (generalized) (M62.81);History of falling (Z91.81);Difficulty in walking, not elsewhere classified (R26.2);Pain Pain - part of body:  (back)    Time: 3329-5188 PT Time Calculation (min) (ACUTE ONLY): 20 min   Charges:   PT Evaluation $PT Eval High Complexity: 1 High          3:44 PM, 10/01/19 [name], PT, DPT Physical Therapist - [facility]  [phone] ([place])    Lemmon C 10/01/2019, 3:41 PM

## 2019-10-01 NOTE — Discharge Summary (Signed)
Physician Discharge Summary  Patient ID: Theresa Mills MRN: [id number] DOB/AGE: 1950/12/27 69 y.o.  Admit date: 09/30/2019 Discharge date: 10/01/2019  Admission Diagnoses:  Discharge Diagnoses:  Active Problems:   Restless leg syndrome   Parkinson disease (HCC)   Acquired hypothyroidism   Osteomyelitis of lumbar vertebra (HCC)   Discitis   Back pain   Epidural abscess   Psoas abscess, right (HCC)   High anion gap metabolic acidosis   Discharged Condition: good  Hospital Course:  Theresa Mills a 69 y.o.femalewith medical history of DM, GERD, anxiety, OSA, Parkinson's with dementia, hypothyroidism, frequent falls who was recently discharged from [facility] hospitalist after being admitted for abdominal pain and neck pain and was found with sepsis secondary to acute pyelonephritis and ultimately was found with L3/L4 discitis/osteomyelitis, right psoas abscess, L3/L4 facet joint arthritis.  She had her abscess aspirated.  Her urine culture and blood cultures were positive for Proteus.  She was ultimately discharged on Rocephin as recommended by infectious disease for total of 6 weeks of therapy.  She had a forearm PICC line placed.  Her Rocephin was supposed to and on 9/13.  Today patient returns complaining of severe back pain which starts from her neck all the way to her leg.  She is riveting in bed with pain even though she was given morphine 2 mg 20 minutes prior to when I saw her.  She is unsure when the pain started.  She thinks she has been getting her antibiotic at the SNF.  She denies any urinary symptoms, chest pain, shortness of breath, diarrhea or abdominal pain.Denies trauma/fall.  MRI scan of the lumbar spine showed discitis at the L3-L4 level.  Patient was seen by neurosurgery, they believe patient condition was not getting worse.  Consult from infect disease was also obtained, decision was made to extend antibiotic treatment for another week to 9/20.  At this point,.  She  is medically stable to be transferred to nursing home for rehab.  #1.  Severe back pain. MRI still has evidence of discitis/osteomyelitis the lumbar spine.  Continue antibiotics with Rocephin per recommendation from his infect disease.  2.  Metabolic acidosis with mild lactic acidosis. Peak lactic acid level 2.3.  Appears to be secondary to Metformin.  Quickly come down after fluids.  No evidence of sepsis.   Consults: Neurosurgery  Significant Diagnostic Studies:  MRI LUMBAR SPINE WITHOUT AND WITH CONTRAST  TECHNIQUE: Multiplanar and multiecho pulse sequences of the lumbar spine were obtained without and with intravenous contrast.  CONTRAST:  80m GADAVIST GADOBUTROL 1 MMOL/ML IV SOLN  COMPARISON:  Prior MRI from 08/28/2019.  FINDINGS: Segmentation:  Standard.  Alignment: Moderate levoscoliosis. Chronic bilateral pars defects at L5 with associated 12 mm spondylolisthesis. Trace 3 mm retrolisthesis of L1 on L2. Appearance is stable from prior.  Vertebrae: Persistent changes consistent with osteomyelitis discitis seen centered about the L3-4 interspace. Persistent abnormal marrow edema and enhancement seen within the L3 and L4 vertebral bodies, with fluid signal intensity and enhancement within the intervening L3-4 disc space. Superimposed compression deformities involving the inferior endplate of L3 and superior endplate of L4 again noted with similar height loss, although endplate irregularity has progressed from prior. Abnormal edema and enhancement again seen extending into the adjacent right greater than left psoas musculature. Eight pigtail drainage catheter previously positioned within the right psoas muscle has been removed. There is a residual small curvilinear collection involving the right psoas muscle at the level of L3-4, measuring up to  1.7 cm in greatest size (series 10, images 24-22). Abnormal edema and enhancement seen about the bilateral L3-4  facets, concerning for associated septic arthritis, right worse than left. Associated edema and enhancement within the adjacent posterior paraspinous soft tissues without definite discrete or drainable fluid collection. Irregular epidural phlegmon/enhancement seen involving the epidural space in a fairly circumferential fashion at the level of L3-4. No definite loculated or discrete epidural abscess now seen on today's exam.  Mild chronic compression deformity involving the superior endplate of K80 again seen, with no associated marrow edema seen on today's exam, consistent with a chronic fracture. Otherwise, vertebral body height maintained. Underlying bone marrow signal intensity within normal limits. Multiple scattered benign hemangiomata again noted. No worrisome osseous lesions.  Conus medullaris and cauda equina: Conus extends to the L2-3 level. Conus and cauda equina appear normal.  Paraspinal and other soft tissues: Bilateral psoas edema and enhancement adjacent to the L3-4 interspace, consistent with associated myositis. Small residual right psoas collection as above. No definite loculated left-sided collections now seen  Disc levels:  L1-2: Diffuse disc bulge, eccentric to the right, with prominent right-sided reactive endplate spurring. Resultant mild narrowing of the right lateral recess. Central canal remains patent. No significant foraminal stenosis. Appearance is stable.  L2-3: Advanced degenerative intervertebral disc space narrowing with diffuse disc bulge, asymmetric to the right. Prominent right-sided reactive endplate changes. Moderate bilateral facet hypertrophy. Resultant moderate right lateral recess narrowing with moderate right foraminal stenosis. Mild to moderate left lateral recess stenosis with mild left foraminal narrowing noted as well. Overall, appearance is relatively stable.  L3-4: Findings consistent with osteomyelitis discitis with  abnormal edema, enhancement, and endplate irregularity about the L3-4 interspace. Superimposed changes suspicious for right greater than left septic arthritis present as well. Irregular circumferential epidural phlegmon and enhancement without definite discrete epidural abscess. Resultant moderate spinal stenosis with moderate to severe bilateral lateral recess narrowing, worsened from previous. Epidural phlegmon and enhancement extends to involve the bilateral neural foramina with associated moderate to severe bilateral foraminal stenosis, also worsened.  L4-5: Disc desiccation with mild left far lateral disc bulge. Mild facet hypertrophy. No significant stenosis.  L5-S1: Chronic bilateral pars defects with associated 12 mm spondylolisthesis. Associated broad posterior pseudo disc bulge/uncovering. Moderate left greater than right facet degeneration. No spinal stenosis. Moderate left L5 foraminal narrowing again noted. Right neural foramen remains patent.  IMPRESSION: 1. Persistent findings consistent with osteomyelitis discitis centered about the L3-4 interspace. Associated abnormal enhancement within the adjacent paraspinous soft tissues, with residual 1.7 cm right psoas collection/abscess. Associated epidural phlegmon/enhancement with no definite discrete epidural abscess now visualized. Associated moderate to severe spinal stenosis with bilateral lateral recess and foraminal narrowing at this level has progressed and worsened from previous. 2. Abnormal edema and enhancement about the bilateral L3-4 facets, concerning for associated septic arthritis, right worse than left. 3. Underlying fracture deformities involving the inferior endplate of L3 and superior endplate of L4, similar to previous, although associated endplate irregularity related to underlying infection has progressed since prior. 4. Chronic bilateral pars defects at L5-S1 with associated 12  mm spondylolisthesis, with moderate left L5 foraminal stenosis.   Electronically Signed   By: Jeannine Boga M.D.   On: 09/30/2019 22:26    Treatments: Observation  Discharge Exam: Blood pressure (!) 114/55, pulse 84, temperature 98.2 F (36.8 C), temperature source Oral, resp. rate 20, height 5' 4"  (1.626 m), weight 54.4 kg, SpO2 95 %. General appearance: alert and cooperative Resp: clear to auscultation bilaterally  Cardio: regular rate and rhythm, S1, S2 normal, no murmur, click, rub or gallop GI: soft, non-tender; bowel sounds normal; no masses,  no organomegaly Extremities: extremities normal, atraumatic, no cyanosis or edema  Disposition: Discharge disposition: 03-Skilled [place]       Discharge Instructions    [facility] pharmacist to adjust dose for Vancomycin, Aminoglycosides and other anti-infective therapies as requested by physician.   Complete by: As directed    [facility] to provide Cath Flo 80m   Complete by: As directed    Administer for PICC line occlusion and as ordered by physician for other access device issues.   Anaphylaxis Kit: Provided to treat any anaphylactic reaction to the medication being provided to the patient if First Dose or when requested by physician   Complete by: As directed    Epinephrine 164mml vial / amp: Administer 0.7m68m0.7ml47mubcutaneously once for moderate to severe anaphylaxis, nurse to call physician and pharmacy when reaction occurs and call 911 if needed for immediate care   Diphenhydramine 50mg7mIV vial: Administer 25-50mg 11mM PRN for first dose reaction, rash, itching, mild reaction, nurse to call physician and pharmacy when reaction occurs   Sodium Chloride 0.9% NS 500ml I79mdminister if needed for hypovolemic blood pressure drop or as ordered by physician after call to physician with anaphylactic reaction   Change dressing on IV access line weekly and PRN   Complete by: As  directed    Diet - low sodium heart healthy   Complete by: As directed    Discharge wound care:   Complete by: As directed    RN dressing changes   Flush IV access with Sodium Chloride 0.9% and Heparin 10 units/ml or 100 units/ml   Complete by: As directed    Home infusion instructions - [facility]   Complete by: As directed    Instructions: Flush IV access with Sodium Chloride 0.9% and Heparin 10units/ml or 100units/ml   Change dressing on IV access line: Weekly and PRN   Instructions Cath Flo 2mg: Ad41mister for PICC Line occlusion and as ordered by physician for other access device   [facility] pharmacist to adjust dose for: Vancomycin, Aminoglycosides and other anti-infective therapies as requested by physician   Increase activity slowly   Complete by: As directed    Method of administration may be changed at the discretion of home infusion pharmacist based upon assessment of the patient and/or caregiver's ability to self-administer the medication ordered   Complete by: As directed    Outpatient Parenteral Antibiotic Therapy Information Antibiotic: Ceftriaxone (Rocephin) IVPB; Indications for use: disccitis; End Date: 10/13/2019   Complete by: As directed    Antibiotic: Ceftriaxone (Rocephin) IVPB   Indications for use: disccitis   End Date: 10/13/2019     Allergies as of 10/01/2019      Reactions   Codeine Other (See Comments)   HALLUCINATIONS   Ephadrene [cholestatin] Other (See Comments)   HYPER AND NERVOUS   Other Other (See Comments)   HYPER HYPER AND NERVOUS   Valium [diazepam] Other (See Comments)   OVERLY SENSITIVE/TOO STRONG      Medication List    TAKE these medications   ALPRAZolam 0.25 MG tablet Commonly known as: XANAX Take 1 tablet (0.25 mg total) by mouth 2 (two) times daily as needed for anxiety or sleep.   Calcium High Potency/Vitamin D 600-200 MG-UNIT Tabs Generic drug: Calcium Carbonate-Vitamin D Take 1 tablet by mouth  daily.  carbidopa-levodopa 50-200 MG tablet Commonly known as: SINEMET CR Take 1 tablet by mouth 2 (two) times daily.   carbidopa-levodopa 25-100 MG tablet Commonly known as: SINEMET IR Take 2 tablets by mouth 4 (four) times daily.   cefTRIAXone  IVPB Commonly known as: ROCEPHIN Inject 2 g into the vein daily for 12 days. Indication:  Proteus bacteremia, discitis, psoas abscess, UTI Last Day of Therapy: 10/06/2019 Labs - Once weekly:  CBC/D, CMP, CRP, ESR   celecoxib 200 MG capsule Commonly known as: CELEBREX Take 1 capsule (200 mg total) by mouth 2 (two) times daily as needed.   citalopram 40 MG tablet Commonly known as: CELEXA Take 40 mg by mouth daily.   docusate sodium 100 MG capsule Commonly known as: COLACE Take 1 capsule (100 mg total) by mouth 2 (two) times daily.   levothyroxine 125 MCG tablet Commonly known as: SYNTHROID Take 1.5 tablets (188 mcg total) by mouth daily at 6 (six) AM.   melatonin 3 MG Tabs tablet Take 3 mg by mouth at bedtime as needed (sleep).   metFORMIN 500 MG tablet Commonly known as: GLUCOPHAGE Take 500 mg by mouth 2 (two) times daily with a meal.   mirtazapine 7.5 MG tablet Commonly known as: REMERON Take 1 tablet (7.5 mg total) by mouth at bedtime.   MULTIVITAMIN WOMEN 50+ PO Take 1 tablet by mouth daily.   omeprazole 40 MG capsule Commonly known as: PRILOSEC Take 1 capsule (40 mg total) by mouth daily.   ondansetron 4 MG disintegrating tablet Commonly known as: ZOFRAN-ODT Take 1 tablet (4 mg total) by mouth every 8 (eight) hours as needed for nausea or vomiting.   OneTouch Delica Lancets 02O Misc TEST BLOOD SUGAR BID   OneTouch Verio test strip Generic drug: glucose blood TEST BLOOD SUGAR BID   rOPINIRole 2 MG 24 hr tablet Commonly known as: REQUIP XL Take 2 mg by mouth at bedtime.   sertraline 50 MG tablet Commonly known as: ZOLOFT Take 1 tablet (50 mg total) by mouth daily.   simvastatin 40 MG tablet Commonly  known as: ZOCOR Take 1 tablet (40 mg total) by mouth at bedtime.   traMADol 50 MG tablet Commonly known as: ULTRAM Take 1 tablet (50 mg total) by mouth every 6 (six) hours as needed for moderate pain.            Discharge Care Instructions  (From admission, onward)         Start     Ordered   10/01/19 0000  Change dressing on IV access line weekly and PRN  (Home infusion instructions - [facility] )        10/01/19 1442   10/01/19 0000  Discharge wound care:       Comments: RN dressing changes   10/01/19 1442          Follow-up Information    Tsosie Billing, MD Follow up.   Specialty: Infectious Diseases Contact information: [place] [address] [phone]        Ronnell Freshwater, NP Follow up in 1 week(s).   Specialty: Family Medicine Contact information: [place] [address] [phone]               Signed: Sharen Hones 10/01/2019, 2:49 PM

## 2019-10-01 NOTE — Treatment Plan (Signed)
Diagnosis: Lumbar osteomyelitis due to proteus Baseline Creatinine < 1    Allergies  Allergen Reactions  . Codeine Other (See Comments)    HALLUCINATIONS  . Ephadrene [Cholestatin] Other (See Comments)    HYPER AND NERVOUS  . Other Other (See Comments)    HYPER  HYPER AND NERVOUS  . Valium [Diazepam] Other (See Comments)    OVERLY SENSITIVE/TOO STRONG    OPAT Orders Discharge antibiotics: Ceftriaxone 2 grams IV every 24 hours End Date: 10/13/19  [facility] Care Per Protocol:  Labs weekly every Monday while on IV antibiotics: _X_ CBC with differential  __X CMP  _X_ ESR   _X_ Please pull PIC at completion of IV antibiotics  Fax weekly labs to [place] [phone]  Clinic Follow Up Appt:Televisit in 2 weeks Call [phone]

## 2019-10-01 NOTE — Consult Note (Signed)
NAME: Theresa Mills  DOB: [birth year]  MRN: [id number]  Date/Time: 10/01/2019 11:11 AM  REQUESTING PROVIDER: Dr.Zheng Subjective:  REASON FOR CONSULT: recent osteomyelitis ? Theresa Mills is a 69 y.o. female with a history of Diabetes mellitus, Parkinson's disease, hypothyroidism, GERD, anxiety, With recent proteus bacteremia and proteus discitis vertebral osteomyelitis was admitted bacuse of severe pain in the back. Pt was having an ID visit and in the waiting room was found to have severe pain and taken to the ED. Pt is currently in SNF getting IV antibiotics for the above infection She was at [facility] between 08/19/19-8/11 Her medical history is as follows     Pt with frequent falls who lived at home with home health until July 21  Was hospitalized between7/13/21 until 08/09/2019 for multiple falls at home.  She had acute rhabdomyolysis.  She was discharged to nursing home. She presented from [place] care on 08/19/2019 with shortness of breath and altered mental status.   CT of the head and cervical spine showed no acute injury.  CT abdomen and pelvis showed distended urinary bladder with bilateral hydronephrosis and findings concerning for pyelonephritis.  There were several nonobstructing left renal calculi.  She was given IV fluids and started on IV ceftriaxone after sending blood cultures.  A Foley catheter was placed.  Blood culture and urine culture grew Proteus which was pansensitive.  She received an MRI lumbar spine to rule out cauda equina syndrome.  MRI was abnormal and it showed abnormal fluid signal intensity within the L3-L4 interspace with associated marrow edema within the adjacent endplates of L3 and L4.  There was associated paraspinous edema within the adjacent psoas musculature.  This was concerning for possible osteomyelitis and discitis.  There was also a previously identified compression fracture at the superior endplate of L4 which was seen again.  There was subacute  compression fracture extending through the inferior endplate of L3.  There was also marrow edema around the right greater than left L3-L4 facets.  All this was concerning for osteomyelitis discitis involving L3 and L4, associated epidural abscess at the L3-L4 level with resultant moderate to severe spinal stenosis.  There was abnormal edema with joint effusions involving the bilateral L3-L4 facets concerning for septic arthritis.  There was also associated paraspinous inflammation centered around the L3-L4 interspace with 1.7 into 1.7 to 4.9 cm collection within the right psoas muscle.IR drained the psoas abscess on and the culture was proteus .she was evaluated by neurosurgery who did not see the  need for surgical intervention. She was discharged to SNF on 8/11 to complete 6 weeks of IV ceftriaxone with last date being 10/06/19. She also had urinary retention needing foley for a short period and it was removed on 09/03/19 before discharge.She saw urology on 09/17/19 and there was no urinary retention present. She also did not have any urinary symptoms then. A routine UA hd microscopic evidence of hematuria but no intervention was deemed necessary. The SNF did a urine culture and it was VRE but it was not treated as it was thought to be a stool contaminant with patient also not having any symptoms  Yesterday she had an appt to see me but she was taken to the Ed from the waiting room because of severe back pain.   In the ED vitals were temp of 97.5, HR 94, Bp 134/90 She had MRI of the lumbar spine which showed similar changes like before  She was started on vanco and cefpeime  after sending blood cultures and also was given morphine which has controlled her pain  Past Medical History:  Diagnosis Date   Anxiety    Diabetes ([place])    Difficult intravenous access    GERD (gastroesophageal reflux disease)    HBP (high blood pressure)    Hyperlipidemia    Osteoarthritis    Parkinson disease ([place])     Sleep apnea    Thyroid disease     Past Surgical History:  Procedure Laterality Date   ABDOMINAL HYSTERECTOMY     CATARACT EXTRACTION Bilateral    CHOLECYSTECTOMY     COLONOSCOPY WITH PROPOFOL N/A 04/02/2015   Procedure: COLONOSCOPY WITH PROPOFOL;  Surgeon: Josefine Class, MD;  Location: [facility] ENDOSCOPY;  Service: Endoscopy;  Laterality: N/A;   ESOPHAGOGASTRODUODENOSCOPY (EGD) WITH PROPOFOL N/A 04/02/2015   Procedure: ESOPHAGOGASTRODUODENOSCOPY (EGD) WITH PROPOFOL;  Surgeon: Josefine Class, MD;  Location: [facility] ENDOSCOPY;  Service: Endoscopy;  Laterality: N/A;   GALLBLADDER SURGERY     LAPAROSCOPIC HYSTERECTOMY     THYROIDECTOMY Left    TONSILLECTOMY     TONSILLECTOMY Bilateral     Social History   Socioeconomic History   Marital status: Widowed    Spouse name: Not on file   Number of children: Not on file   Years of education: Not on file   Highest education level: Not on file  Occupational History   Not on file  Tobacco Use   Smoking status: Never Smoker   Smokeless tobacco: Never Used  Vaping Use   Vaping Use: Never used  Substance and Sexual Activity   Alcohol use: No   Drug use: No   Sexual activity: Not on file  Other Topics Concern   Not on file  Social History Narrative   Not on file   Social Determinants of Health   Financial Resource Strain:    Difficulty of Paying Living Expenses: Not on file  Food Insecurity:    Worried About [place] in the Last Year: Not on file   Ran Out of Food in the Last Year: Not on file  Transportation Needs:    Lack of Transportation (Medical): Not on file   Lack of Transportation (Non-Medical): Not on file  Physical Activity:    Days of Exercise per Week: Not on file   Minutes of Exercise per Session: Not on file  Stress:    Feeling of Stress : Not on file  Social Connections:    Frequency of Communication with Friends and Family: Not on file   Frequency of Social  Gatherings with Friends and Family: Not on file   Attends Religious Services: Not on file   Active Member of [place] or Organizations: Not on file   Attends Archivist Meetings: Not on file   Marital Status: Not on file  Intimate Partner Violence:    Fear of Current or Ex-Partner: Not on file   Emotionally Abused: Not on file   Physically Abused: Not on file   Sexually Abused: Not on file    Family History  Problem Relation Age of Onset   Asthma Father    Cancer Sister    Stroke Maternal Uncle    Heart disease Maternal Uncle    Diabetes Maternal Uncle    Allergies  Allergen Reactions   Codeine Other (See Comments)    HALLUCINATIONS   Ephadrene [Cholestatin] Other (See Comments)    HYPER AND NERVOUS   Other Other (See Comments)    HYPER  HYPER AND NERVOUS   Valium [Diazepam] Other (See Comments)    OVERLY SENSITIVE/TOO STRONG     ? Current Facility-Administered Medications  Medication Dose Route Frequency Provider Last Rate Last Admin   0.9 %  sodium chloride infusion   Intravenous Continuous Nolberto Hanlon, MD 75 mL/hr at 10/01/19 0400 Rate Verify at 10/01/19 0400   acetaminophen (TYLENOL) tablet 650 mg  650 mg Oral Q6H PRN Nolberto Hanlon, MD       Or   acetaminophen (TYLENOL) suppository 650 mg  650 mg Rectal Q6H PRN Nolberto Hanlon, MD       ALPRAZolam Duanne Moron) tablet 0.25 mg  0.25 mg Oral BID PRN Nolberto Hanlon, MD       calcium-vitamin D (OSCAL WITH D) 500-200 MG-UNIT per tablet 1 tablet  1 tablet Oral Daily Nolberto Hanlon, MD   1 tablet at 10/01/19 0847   carbidopa-levodopa (SINEMET CR) 50-200 MG per tablet controlled release 1 tablet  1 tablet Oral BID Nolberto Hanlon, MD   1 tablet at 10/01/19 0848   carbidopa-levodopa (SINEMET IR) 25-100 MG per tablet immediate release 2 tablet  2 tablet Oral QID Nolberto Hanlon, MD   2 tablet at 10/01/19 0847   cefTRIAXone (ROCEPHIN) 2 g in sodium chloride 0.9 % 100 mL IVPB  2 g Intravenous Q24H Jaylin Roundy,  Joellyn Quails, MD       celecoxib (CELEBREX) capsule 200 mg  200 mg Oral BID PRN Nolberto Hanlon, MD       Chlorhexidine Gluconate Cloth 2 % PADS 6 each  6 each Topical Daily Kurtis Bushman, Sahar, MD       citalopram (CELEXA) tablet 40 mg  40 mg Oral Daily Nolberto Hanlon, MD   40 mg at 10/01/19 0846   docusate sodium (COLACE) capsule 100 mg  100 mg Oral BID Nolberto Hanlon, MD   100 mg at 10/01/19 0846   docusate sodium (COLACE) capsule 100 mg  100 mg Oral BID PRN Nolberto Hanlon, MD       enoxaparin (LOVENOX) injection 40 mg  40 mg Subcutaneous Q24H Amery, Sahar, MD   40 mg at 09/30/19 2316   insulin aspart (novoLOG) injection 0-15 Units  0-15 Units Subcutaneous Q4H Nolberto Hanlon, MD       levothyroxine (SYNTHROID) tablet 188 mcg  188 mcg Oral QAC breakfast Nolberto Hanlon, MD   188 mcg at 10/01/19 0604   LORazepam (ATIVAN) injection 1 mg  1 mg Intravenous PRN Nolberto Hanlon, MD       melatonin tablet 5 mg  5 mg Oral QHS Nolberto Hanlon, MD   5 mg at 09/30/19 2315   methocarbamol (ROBAXIN) 500 mg in dextrose 5 % 50 mL IVPB  500 mg Intravenous Q6H PRN Nolberto Hanlon, MD       mirtazapine (REMERON) tablet 7.5 mg  7.5 mg Oral QHS Nolberto Hanlon, MD   7.5 mg at 09/30/19 2315   morphine 2 MG/ML injection 2 mg  2 mg Intravenous Q2H PRN Nolberto Hanlon, MD       multivitamin with minerals tablet 1 tablet  1 tablet Oral Daily Nolberto Hanlon, MD   1 tablet at 10/01/19 0848   ondansetron (ZOFRAN-ODT) disintegrating tablet 4 mg  4 mg Oral Q8H PRN Nolberto Hanlon, MD   4 mg at 09/30/19 2317   pantoprazole (PROTONIX) EC tablet 40 mg  40 mg Oral Daily Nolberto Hanlon, MD   40 mg at 10/01/19 0846   rOPINIRole (REQUIP) tablet 2 mg  2 mg Oral QHS Nolberto Hanlon, MD  2 mg at 09/30/19 2315   senna (SENOKOT) tablet 8.6 mg  1 tablet Oral Daily PRN Nolberto Hanlon, MD       sertraline (ZOLOFT) tablet 50 mg  50 mg Oral Daily Nolberto Hanlon, MD   50 mg at 10/01/19 0847   simvastatin (ZOCOR) tablet 40 mg  40 mg Oral QHS Nolberto Hanlon, MD   40 mg at  09/30/19 2315     Abtx:  Anti-infectives (From admission, onward)   Start     Dose/Rate Route Frequency Ordered Stop   10/01/19 1200  cefTRIAXone (ROCEPHIN) 2 g in sodium chloride 0.9 % 100 mL IVPB       Note to Pharmacy: Timing  as per cefepime last dose   2 g 200 mL/hr over 30 Minutes Intravenous Every 24 hours 10/01/19 1036     10/01/19 0500  vancomycin (VANCOREADY) IVPB 500 mg/100 mL  Status:  Discontinued        500 mg 100 mL/hr over 60 Minutes Intravenous Every 12 hours 09/30/19 1857 10/01/19 1036   09/30/19 2200  ceFEPIme (MAXIPIME) 1 g in sodium chloride 0.9 % 100 mL IVPB  Status:  Discontinued       Note to Pharmacy: Dosing per pharmacy   1 g 200 mL/hr over 30 Minutes Intravenous Every 12 hours 09/30/19 1847 09/30/19 1931   09/30/19 2200  ceFEPIme (MAXIPIME) 2 g in sodium chloride 0.9 % 100 mL IVPB  Status:  Discontinued       Note to Pharmacy: Dosing per pharmacy   2 g 200 mL/hr over 30 Minutes Intravenous Every 12 hours 09/30/19 1931 10/01/19 1036   09/30/19 1630  vancomycin (VANCOREADY) IVPB 1250 mg/250 mL        1,250 mg 166.7 mL/hr over 90 Minutes Intravenous  Once 09/30/19 1601 09/30/19 1834   09/30/19 1630  ceFEPIme (MAXIPIME) 2 g in sodium chloride 0.9 % 100 mL IVPB        2 g 200 mL/hr over 30 Minutes Intravenous  Once 09/30/19 1601 09/30/19 1723      REVIEW OF SYSTEMS:  Const: negative fever, negative chills, negative weight loss Eyes: negative diplopia or visual changes, negative eye pain ENT: negative coryza, negative sore throat Resp: negative cough, hemoptysis, dyspnea Cards: negative for chest pain, palpitations, lower extremity edema GU: negative for frequency, dysuria and hematuria GI: Negative for abdominal pain, diarrhea, bleeding, constipation Skin: negative for rash and pruritus Heme: negative for easy bruising and gum/nose bleeding MS: , back pain and muscle weakness Neurolo:negative for headaches, dizziness, vertigo, memory problems , has  tremors  Psych: negative for feelings of anxiety, depression  Allergy/Immunology- as above Objective:  VITALS:  BP 96/62 (BP Location: Left Arm)    Pulse 74    Temp 98.1 F (36.7 C) (Oral)    Resp 16    Ht 5' 4"  (1.626 m)    Wt 54.4 kg    SpO2 95%    BMI 20.60 kg/m  PHYSICAL EXAM:  General: Alert, cooperative, no distress, appears stated age. pale Head: Normocephalic, without obvious abnormality, atraumatic. Eyes: Conjunctivae clear, anicteric sclerae. Pupils are equal ENT Nares normal. No drainage or sinus tenderness. Lips, mucosa, and tongue normal. No Thrush, poor dentition Neck: Supple, symmetrical, no adenopathy, thyroid: non tender no carotid bruit and no JVD. Back: No CVA tenderness. Lungs: Clear to auscultation bilaterally. No Wheezing or Rhonchi. No rales. Heart: Regular rate and rhythm, no murmur, rub or gallop. Abdomen: Soft, non-tender,not distended. Bowel sounds normal. No masses  Extremities: atraumatic, no cyanosis. No edema. No clubbing Skin: No rashes or lesions. Or bruising Lymph: Cervical, supraclavicular normal. Neurologic: intentional tremors rt arm Grossly non-focal Pertinent Labs Lab Results CBC    Component Value Date/Time   WBC 6.2 10/01/2019 0615   RBC 3.44 (L) 10/01/2019 0615   HGB 10.9 (L) 10/01/2019 0615   HGB 15.9 01/09/2017 1041   HCT 34.2 (L) 10/01/2019 0615   HCT 49.8 (H) 01/09/2017 1041   PLT 195 10/01/2019 0615   PLT 237 01/09/2017 1041   MCV 99.4 10/01/2019 0615   MCV 97 01/09/2017 1041   MCV 94 01/12/2014 2110   MCH 31.7 10/01/2019 0615   MCHC 31.9 10/01/2019 0615   RDW 14.3 10/01/2019 0615   RDW 13.5 01/09/2017 1041   RDW 14.0 01/12/2014 2110   LYMPHSABS 1.4 09/30/2019 1450   LYMPHSABS 1.6 01/09/2017 1041   LYMPHSABS 2.0 01/12/2014 2110   MONOABS 0.6 09/30/2019 1450   MONOABS 0.6 01/12/2014 2110   EOSABS 0.0 09/30/2019 1450   EOSABS 0.1 01/09/2017 1041   EOSABS 0.1 01/12/2014 2110   BASOSABS 0.0 09/30/2019 1450   BASOSABS 0.0  01/09/2017 1041   BASOSABS 0.1 01/12/2014 2110    CMP Latest Ref Rng & Units 10/01/2019 09/30/2019 09/30/2019  Glucose 70 - 99 mg/dL - - 97  BUN 8 - 23 mg/dL - - 21  Creatinine 0.44 - 1.00 mg/dL 0.31(L) 0.39(L) 0.47  Sodium 135 - 145 mmol/L - - 143  Potassium 3.5 - 5.1 mmol/L - - 4.0  Chloride 98 - 111 mmol/L - - 108  CO2 22 - 32 mmol/L - - 19(L)  Calcium 8.9 - 10.3 mg/dL - - 9.3  Total Protein 6.5 - 8.1 g/dL - - 7.4  Total Bilirubin 0.3 - 1.2 mg/dL - - 1.0  Alkaline Phos 38 - 126 U/L - - 72  AST 15 - 41 U/L - - 20  ALT 0 - 44 U/L - - 6      Microbiology: Recent Results (from the past 240 hour(s))  SARS Coronavirus 2 by RT PCR (hospital order, performed in [place] hospital lab) Nasopharyngeal Nasopharyngeal Swab     Status: None   Collection Time: 09/30/19  3:27 PM   Specimen: Nasopharyngeal Swab  Result Value Ref Range Status   SARS Coronavirus 2 NEGATIVE NEGATIVE Final    Comment: (NOTE) SARS-CoV-2 target nucleic acids are NOT DETECTED.  The SARS-CoV-2 RNA is generally detectable in upper and lower respiratory specimens during the acute phase of infection. The lowest concentration of SARS-CoV-2 viral copies this assay can detect is 250 copies / mL. A negative result does not preclude SARS-CoV-2 infection and should not be used as the sole basis for treatment or other patient management decisions.  A negative result may occur with improper specimen collection / handling, submission of specimen other than nasopharyngeal swab, presence of viral mutation(s) within the areas targeted by this assay, and inadequate number of viral copies (<250 copies / mL). A negative result must be combined with clinical observations, patient history, and epidemiological information.  Fact Sheet for Patients:   StrictlyIdeas.no  Fact Sheet for Healthcare Providers: BankingDealers.co.za  This test is not yet approved or  cleared by the [place] FDA and has been authorized for detection and/or diagnosis of SARS-CoV-2 by FDA under an Emergency Use Authorization (EUA).  This EUA will remain in effect (meaning this test can be used) for the duration of the COVID-19 declaration under Section 564(b)(1) of the Act,  21 U.S.C. section 360bbb-3(b)(1), unless the authorization is terminated or revoked sooner.  Performed at [facility], [address] [place], [place] [address]   Blood culture (routine x 2)     Status: None (Preliminary result)   Collection Time: 09/30/19  6:13 PM   Specimen: BLOOD  Result Value Ref Range Status   Specimen Description BLOOD LEFT ARM  Final   Special Requests   Final    BOTTLES DRAWN AEROBIC AND ANAEROBIC Blood Culture adequate volume   Culture   Final    NO GROWTH < 24 HOURS Performed at [facility], [address], [place], [place] [address]    Report Status PENDING  Incomplete  Blood culture (routine x 2)     Status: None (Preliminary result)   Collection Time: 09/30/19  6:15 PM   Specimen: BLOOD  Result Value Ref Range Status   Specimen Description BLOOD LEFT ANTECUBITAL  Final   Special Requests   Final    BOTTLES DRAWN AEROBIC AND ANAEROBIC Blood Culture adequate volume   Culture   Final    NO GROWTH < 24 HOURS Performed at [facility], [address] [place], [place] [address]    Report Status PENDING  Incomplete    IMAGING RESULTS: Findings consistent with osteomyelitis discitis with abnormal edema, enhancement, and endplate irregularity about the L3-4 interspace. Superimposed changes suspicious for right greater than left septic arthritis present as well. Irregular circumferential epidural phlegmon and enhancement without definite discrete epidural abscess. Resultant moderate spinal stenosis with moderate to severe bilateral lateral recess narrowing, worsened from previous. Epidural phlegmon and enhancement extends to involve the bilateral  neural foramina with associated moderate to severe bilateral foraminal stenosis, also worsened. I have personally reviewed the films ? Impression/Recommendation ?  L3-L4 discitis/osteomyelitis, right psoas, left smallabscess, L3-L4 facet joint arthritis/fluid.diagnosed 08/24/19, aspirated on 08/25/19 s/p drainage of abscess and on ceftriaxone - week 5 Severe back pain- repeat MRI on 09/30/19 pretty much the same- Seen by neurosurgery and no intervention needed for any of the findings  Initial plan was  to Continue ceftriaxone until -10/06/19.will now extend by another week or can do Po keflex Will check ESR/CRP to see improvement On 08/29/19 ESR was 71 and CRP 1.1 Now ESR 51  Proteus bacteremia secondary to Proteus urinary tract infection.  Anemia  H/O Urinary retention leading to mild  bilateral hydronephrosis needing foley catheter during last hospitalization has resolved- she now has purewick Parkinson's disease on meds  Restlesslegsyndrome on Requip  Hypothyroidism on Synthroid ? ___________________________________________________ Discussed with patient,and  requesting provider .

## 2019-10-02 ENCOUNTER — Telehealth: Payer: Self-pay | Admitting: Infectious Diseases

## 2019-10-02 NOTE — Telephone Encounter (Signed)
Red Christians called and stated she was returning a call for Theresa Mills. I did not see any notes in her chart regarding anyone who called, would you have any idea who called and why they called?

## 2019-10-02 NOTE — Telephone Encounter (Signed)
Someone set up an appt for hosp follow up for her from admitting so maybe tney called to remind of appt ?

## 2019-10-03 DIAGNOSIS — F0280 Dementia in other diseases classified elsewhere without behavioral disturbance: Secondary | ICD-10-CM | POA: Diagnosis not present

## 2019-10-03 DIAGNOSIS — E119 Type 2 diabetes mellitus without complications: Secondary | ICD-10-CM | POA: Diagnosis not present

## 2019-10-03 DIAGNOSIS — M4647 Discitis, unspecified, lumbosacral region: Secondary | ICD-10-CM | POA: Diagnosis not present

## 2019-10-03 DIAGNOSIS — G20 Parkinson's disease: Secondary | ICD-10-CM | POA: Diagnosis not present

## 2019-10-03 DIAGNOSIS — M25511 Pain in right shoulder: Secondary | ICD-10-CM | POA: Diagnosis not present

## 2019-10-05 LAB — CULTURE, BLOOD (ROUTINE X 2)
Culture: NO GROWTH
Culture: NO GROWTH
Special Requests: ADEQUATE
Special Requests: ADEQUATE

## 2019-10-06 ENCOUNTER — Telehealth: Payer: Self-pay

## 2019-10-06 NOTE — Telephone Encounter (Signed)
Unable to confirm pt appt 10/08/19

## 2019-10-08 ENCOUNTER — Ambulatory Visit: Payer: Medicare Other | Admitting: Internal Medicine

## 2019-10-08 ENCOUNTER — Telehealth: Payer: Self-pay

## 2019-10-08 NOTE — Telephone Encounter (Signed)
Tried to reach pt several times regarding hospital stay and to [facility] follow up appointments, she is now in rehab and will call once going to be discharged from rehab and date in unknown. Theresa Mills

## 2019-10-08 NOTE — Telephone Encounter (Signed)
Per Dr. Delaine Lame appt for 10/09/19 moved to telephone visit. Milo aware.

## 2019-10-09 ENCOUNTER — Ambulatory Visit: Payer: Medicare Other | Attending: Infectious Diseases | Admitting: Infectious Diseases

## 2019-10-09 ENCOUNTER — Encounter: Payer: Self-pay | Admitting: Infectious Diseases

## 2019-10-09 ENCOUNTER — Ambulatory Visit: Payer: Medicare Other | Admitting: Infectious Diseases

## 2019-10-09 DIAGNOSIS — M4626 Osteomyelitis of vertebra, lumbar region: Secondary | ICD-10-CM

## 2019-10-09 DIAGNOSIS — R7881 Bacteremia: Secondary | ICD-10-CM

## 2019-10-09 NOTE — Progress Notes (Signed)
The purpose of this virtual visit is to provide medical care while limiting exposure to the novel coronavirus (COVID19) for both patient and office staff.   Consent was obtained for phone visit:  Yes.   Answered questions that patient had about telehealth interaction:  Yes.   I discussed the limitations, risks, security and privacy concerns of performing an evaluation and management service by telephone. I also discussed with the patient that there may be a patient responsible charge related to this service. The patient expressed understanding and agreed to proceed.   Patient Location: [facility] Provider Location:office Theresa Mills with h/o DM, Parkinsons disease, hypothyroidism, anxiety , Proteus bacteremia 08/19/19 and L3/L4 discitis and osteo with paraspinal abscess , culture from which was proteus on 08/25/19 has been on Iv antibiotics , currently on ceftriaxone with end date of 10/13/19. She also had transient urinary retention when she was in the hospital with the need for fley but that is no longer an issue Pt has been having severe pain in the lumbar area especially when she sits up. She says the pain has been better of late. Says it is 5/10. Gets tramadol and thinks her requirement is less than before She has no fever, rash, diarrhea, abdominal pain, no urinary problem. Spoke to her nurse Theresa Mills who says she is calmer than last week Spoke to Theresa Mills her PT who says the restricting factor is getting her up in chair or sitting her up in bed because of pain- Once she is able to do that she can walk with a walker.He had talked to the neurosurgery NP Theresa Mills couple of weeks ago and a brace was discussed- He says the physician will have to order it. He thinks a thracic/lumbar brace is not going to help and is cumbersome where as a lumbar brace may help. I reviewed her labs And ESR from 9/13 is 49 ( 71 on 08/29/19 and 51 on 10/01/19) CRP 0.51 ( was 3.6 on 9/8) CBC/CMP okay Next lab will be on  10/13/19- depending on that will decide on continuing  IV ceftriaxone VS PO keflex for 2 more weeks Total time spent on the phone providing care and coordination was 32 minutes. Will call her in a week

## 2019-11-05 ENCOUNTER — Ambulatory Visit: Payer: Medicare Other | Admitting: Hospice and Palliative Medicine

## 2019-11-07 DIAGNOSIS — F419 Anxiety disorder, unspecified: Secondary | ICD-10-CM | POA: Diagnosis not present

## 2019-11-07 DIAGNOSIS — F331 Major depressive disorder, recurrent, moderate: Secondary | ICD-10-CM | POA: Diagnosis not present

## 2019-11-21 DIAGNOSIS — M6281 Muscle weakness (generalized): Secondary | ICD-10-CM | POA: Diagnosis not present

## 2019-11-21 DIAGNOSIS — Z23 Encounter for immunization: Secondary | ICD-10-CM | POA: Diagnosis not present

## 2019-11-21 DIAGNOSIS — R41841 Cognitive communication deficit: Secondary | ICD-10-CM | POA: Diagnosis not present

## 2019-11-28 DIAGNOSIS — F419 Anxiety disorder, unspecified: Secondary | ICD-10-CM | POA: Diagnosis not present

## 2019-11-28 DIAGNOSIS — F331 Major depressive disorder, recurrent, moderate: Secondary | ICD-10-CM | POA: Diagnosis not present

## 2019-12-04 DIAGNOSIS — Z23 Encounter for immunization: Secondary | ICD-10-CM | POA: Diagnosis not present

## 2019-12-11 DIAGNOSIS — F331 Major depressive disorder, recurrent, moderate: Secondary | ICD-10-CM | POA: Diagnosis not present

## 2019-12-16 DIAGNOSIS — G20 Parkinson's disease: Secondary | ICD-10-CM | POA: Diagnosis not present

## 2019-12-16 DIAGNOSIS — Z0000 Encounter for general adult medical examination without abnormal findings: Secondary | ICD-10-CM | POA: Diagnosis not present

## 2019-12-16 DIAGNOSIS — F0280 Dementia in other diseases classified elsewhere without behavioral disturbance: Secondary | ICD-10-CM | POA: Diagnosis not present

## 2019-12-16 DIAGNOSIS — E119 Type 2 diabetes mellitus without complications: Secondary | ICD-10-CM | POA: Diagnosis not present

## 2019-12-16 DIAGNOSIS — E039 Hypothyroidism, unspecified: Secondary | ICD-10-CM | POA: Diagnosis not present

## 2019-12-16 DIAGNOSIS — F325 Major depressive disorder, single episode, in full remission: Secondary | ICD-10-CM | POA: Diagnosis not present

## 2019-12-26 DIAGNOSIS — F331 Major depressive disorder, recurrent, moderate: Secondary | ICD-10-CM | POA: Diagnosis not present

## 2019-12-26 DIAGNOSIS — F419 Anxiety disorder, unspecified: Secondary | ICD-10-CM | POA: Diagnosis not present

## 2020-01-01 DIAGNOSIS — F331 Major depressive disorder, recurrent, moderate: Secondary | ICD-10-CM | POA: Diagnosis not present

## 2020-01-06 DIAGNOSIS — R2681 Unsteadiness on feet: Secondary | ICD-10-CM | POA: Diagnosis not present

## 2020-01-06 DIAGNOSIS — M6281 Muscle weakness (generalized): Secondary | ICD-10-CM | POA: Diagnosis not present

## 2020-01-06 DIAGNOSIS — R2689 Other abnormalities of gait and mobility: Secondary | ICD-10-CM | POA: Diagnosis not present

## 2020-01-07 DIAGNOSIS — R2681 Unsteadiness on feet: Secondary | ICD-10-CM | POA: Diagnosis not present

## 2020-01-07 DIAGNOSIS — R2689 Other abnormalities of gait and mobility: Secondary | ICD-10-CM | POA: Diagnosis not present

## 2020-01-07 DIAGNOSIS — M6281 Muscle weakness (generalized): Secondary | ICD-10-CM | POA: Diagnosis not present

## 2020-01-08 DIAGNOSIS — R2681 Unsteadiness on feet: Secondary | ICD-10-CM | POA: Diagnosis not present

## 2020-01-08 DIAGNOSIS — R2689 Other abnormalities of gait and mobility: Secondary | ICD-10-CM | POA: Diagnosis not present

## 2020-01-08 DIAGNOSIS — M6281 Muscle weakness (generalized): Secondary | ICD-10-CM | POA: Diagnosis not present

## 2020-01-09 DIAGNOSIS — M6281 Muscle weakness (generalized): Secondary | ICD-10-CM | POA: Diagnosis not present

## 2020-01-09 DIAGNOSIS — R2681 Unsteadiness on feet: Secondary | ICD-10-CM | POA: Diagnosis not present

## 2020-01-09 DIAGNOSIS — R2689 Other abnormalities of gait and mobility: Secondary | ICD-10-CM | POA: Diagnosis not present

## 2020-01-12 DIAGNOSIS — M6281 Muscle weakness (generalized): Secondary | ICD-10-CM | POA: Diagnosis not present

## 2020-01-12 DIAGNOSIS — R2689 Other abnormalities of gait and mobility: Secondary | ICD-10-CM | POA: Diagnosis not present

## 2020-01-12 DIAGNOSIS — R2681 Unsteadiness on feet: Secondary | ICD-10-CM | POA: Diagnosis not present

## 2020-01-13 DIAGNOSIS — R2681 Unsteadiness on feet: Secondary | ICD-10-CM | POA: Diagnosis not present

## 2020-01-13 DIAGNOSIS — R2689 Other abnormalities of gait and mobility: Secondary | ICD-10-CM | POA: Diagnosis not present

## 2020-01-13 DIAGNOSIS — M6281 Muscle weakness (generalized): Secondary | ICD-10-CM | POA: Diagnosis not present

## 2020-01-14 DIAGNOSIS — M6281 Muscle weakness (generalized): Secondary | ICD-10-CM | POA: Diagnosis not present

## 2020-01-14 DIAGNOSIS — R2689 Other abnormalities of gait and mobility: Secondary | ICD-10-CM | POA: Diagnosis not present

## 2020-01-14 DIAGNOSIS — R2681 Unsteadiness on feet: Secondary | ICD-10-CM | POA: Diagnosis not present

## 2020-01-15 DIAGNOSIS — M6281 Muscle weakness (generalized): Secondary | ICD-10-CM | POA: Diagnosis not present

## 2020-01-15 DIAGNOSIS — R2681 Unsteadiness on feet: Secondary | ICD-10-CM | POA: Diagnosis not present

## 2020-01-15 DIAGNOSIS — R2689 Other abnormalities of gait and mobility: Secondary | ICD-10-CM | POA: Diagnosis not present

## 2020-01-16 DIAGNOSIS — R2681 Unsteadiness on feet: Secondary | ICD-10-CM | POA: Diagnosis not present

## 2020-01-16 DIAGNOSIS — R2689 Other abnormalities of gait and mobility: Secondary | ICD-10-CM | POA: Diagnosis not present

## 2020-01-16 DIAGNOSIS — M6281 Muscle weakness (generalized): Secondary | ICD-10-CM | POA: Diagnosis not present

## 2020-01-19 DIAGNOSIS — R2689 Other abnormalities of gait and mobility: Secondary | ICD-10-CM | POA: Diagnosis not present

## 2020-01-19 DIAGNOSIS — M6281 Muscle weakness (generalized): Secondary | ICD-10-CM | POA: Diagnosis not present

## 2020-01-19 DIAGNOSIS — R2681 Unsteadiness on feet: Secondary | ICD-10-CM | POA: Diagnosis not present

## 2020-01-20 DIAGNOSIS — R2689 Other abnormalities of gait and mobility: Secondary | ICD-10-CM | POA: Diagnosis not present

## 2020-01-20 DIAGNOSIS — M6281 Muscle weakness (generalized): Secondary | ICD-10-CM | POA: Diagnosis not present

## 2020-01-20 DIAGNOSIS — R2681 Unsteadiness on feet: Secondary | ICD-10-CM | POA: Diagnosis not present

## 2020-01-21 DIAGNOSIS — R2689 Other abnormalities of gait and mobility: Secondary | ICD-10-CM | POA: Diagnosis not present

## 2020-01-21 DIAGNOSIS — M6281 Muscle weakness (generalized): Secondary | ICD-10-CM | POA: Diagnosis not present

## 2020-01-21 DIAGNOSIS — R2681 Unsteadiness on feet: Secondary | ICD-10-CM | POA: Diagnosis not present

## 2020-01-22 DIAGNOSIS — R2689 Other abnormalities of gait and mobility: Secondary | ICD-10-CM | POA: Diagnosis not present

## 2020-01-22 DIAGNOSIS — R2681 Unsteadiness on feet: Secondary | ICD-10-CM | POA: Diagnosis not present

## 2020-01-22 DIAGNOSIS — M6281 Muscle weakness (generalized): Secondary | ICD-10-CM | POA: Diagnosis not present

## 2020-01-23 DIAGNOSIS — R2681 Unsteadiness on feet: Secondary | ICD-10-CM | POA: Diagnosis not present

## 2020-01-23 DIAGNOSIS — M6281 Muscle weakness (generalized): Secondary | ICD-10-CM | POA: Diagnosis not present

## 2020-01-23 DIAGNOSIS — R2689 Other abnormalities of gait and mobility: Secondary | ICD-10-CM | POA: Diagnosis not present

## 2020-04-26 ENCOUNTER — Telehealth: Payer: Self-pay | Admitting: Nurse Practitioner

## 2020-04-26 NOTE — Progress Notes (Signed)
  Chronic Care Management   Note  04/26/2020 Name: Theresa Mills MRN: [id number] DOB: October 09, 1950  Theresa Mills is a 70 y.o. year old female who is a primary care patient of Ronnell Freshwater, NP. I reached out to [name] by phone today in response to a referral sent by Theresa Mills's PCP, Ronnell Freshwater, NP.   Theresa Mills was given information about Chronic Care Management services today including:  1. CCM service includes personalized support from designated clinical staff supervised by her physician, including individualized plan of care and coordination with other care providers 2. 24/7 contact phone numbers for assistance for urgent and routine care needs. 3. Service will only be billed when office clinical staff spend 20 minutes or more in a month to coordinate care. 4. Only one practitioner may furnish and bill the service in a calendar month. 5. The patient may stop CCM services at any time (effective at the end of the month) by phone call to the office staff.   Patient agreed to services and verbal consent obtained.   Follow up plan:   Carley Perdue [employer] Scheduler

## 2020-05-11 ENCOUNTER — Encounter (INDEPENDENT_AMBULATORY_CARE_PROVIDER_SITE_OTHER): Payer: Self-pay

## 2020-05-11 ENCOUNTER — Ambulatory Visit (INDEPENDENT_AMBULATORY_CARE_PROVIDER_SITE_OTHER): Payer: Medicare Other | Admitting: Hospice and Palliative Medicine

## 2020-05-11 ENCOUNTER — Other Ambulatory Visit: Payer: Self-pay

## 2020-05-11 ENCOUNTER — Encounter: Payer: Self-pay | Admitting: Hospice and Palliative Medicine

## 2020-05-11 VITALS — BP 132/82 | HR 75 | Temp 98.0°F | Resp 16 | Ht 64.0 in | Wt 172.6 lb

## 2020-05-11 DIAGNOSIS — R300 Dysuria: Secondary | ICD-10-CM

## 2020-05-11 DIAGNOSIS — E039 Hypothyroidism, unspecified: Secondary | ICD-10-CM | POA: Diagnosis not present

## 2020-05-11 DIAGNOSIS — Z1231 Encounter for screening mammogram for malignant neoplasm of breast: Secondary | ICD-10-CM

## 2020-05-11 DIAGNOSIS — R5383 Other fatigue: Secondary | ICD-10-CM

## 2020-05-11 DIAGNOSIS — I10 Essential (primary) hypertension: Secondary | ICD-10-CM

## 2020-05-11 NOTE — Progress Notes (Signed)
[facility] [place], [place] [address]  Internal MEDICINE  Office Visit Note  Patient Name: Theresa Mills  [id number]  [id number]  Date of Service: 05/18/2020  Chief Complaint  Patient presents with  . Follow-up    Rehab  . Hypertension  . Hyperlipidemia  . Gastroesophageal Reflux  . Quality Metric Gaps    Eye exam  and AWV    HPI Patient is here for routine follow-up Recently moved back into her home after spending the last year living within skilled nursing facility Thankful to be back home sleeping in her own bed Receiving PT and OT within her home and has home health aides with her 24/7 Working with PT to regain her strength and mobility  Enjoying having home cooked meals again, no issues with her appetite or bowel habits Sleeping well each night  Aide accompanying her today reports foul smelling odor from her urine--noticed a few days ago and urine is cloudy in appearance She denies increased frequency, burning with urination or low back pain  Current Medication: Outpatient Encounter Medications as of 05/11/2020  Medication Sig  . Calcium Carbonate-Vitamin D 600-200 MG-UNIT TABS Take 1 tablet by mouth daily.   . carbidopa-levodopa (SINEMET CR) 50-200 MG tablet Take 1 tablet by mouth 2 (two) times daily.   . carbidopa-levodopa (SINEMET IR) 25-100 MG tablet Take 2 tablets by mouth 4 (four) times daily.  . celecoxib (CELEBREX) 200 MG capsule Take 1 capsule (200 mg total) by mouth 2 (two) times daily as needed.  . docusate sodium (COLACE) 100 MG capsule Take 1 capsule (100 mg total) by mouth 2 (two) times daily.  Marland Kitchen glucose blood test strip TEST BLOOD SUGAR BID  . levothyroxine (SYNTHROID) 125 MCG tablet Take 1.5 tablets (188 mcg total) by mouth daily at 6 (six) AM.  . Melatonin 3 MG TABS Take 3 mg by mouth at bedtime as needed (sleep).   . metFORMIN (GLUCOPHAGE) 500 MG tablet Take 500 mg by mouth 2 (two) times daily with a meal.   . Multiple  Vitamins-Minerals (MULTIVITAMIN WOMEN 50+ PO) Take 1 tablet by mouth daily.   Marland Kitchen omeprazole (PRILOSEC) 40 MG capsule Take 1 capsule (40 mg total) by mouth daily.  Glory Rosebush DELICA LANCETS 18H MISC TEST BLOOD SUGAR BID  . rOPINIRole (REQUIP XL) 2 MG 24 hr tablet Take 2 mg by mouth at bedtime.   . sertraline (ZOLOFT) 50 MG tablet Take 1 tablet (50 mg total) by mouth daily.  . simvastatin (ZOCOR) 40 MG tablet Take 1 tablet (40 mg total) by mouth at bedtime.  . [DISCONTINUED] nitrofurantoin, macrocrystal-monohydrate, (MACROBID) 100 MG capsule Take 1 capsule (100 mg total) by mouth 2 (two) times daily.  . [DISCONTINUED] ALPRAZolam (XANAX) 0.25 MG tablet Take 1 tablet (0.25 mg total) by mouth 2 (two) times daily as needed for anxiety or sleep. (Patient not taking: Reported on 05/11/2020)  . [DISCONTINUED] citalopram (CELEXA) 40 MG tablet Take 40 mg by mouth daily. (Patient not taking: Reported on 05/11/2020)  . [DISCONTINUED] mirtazapine (REMERON) 7.5 MG tablet Take 1 tablet (7.5 mg total) by mouth at bedtime. (Patient not taking: Reported on 05/11/2020)  . [DISCONTINUED] ondansetron (ZOFRAN) 4 MG tablet Take 4 mg by mouth every 8 (eight) hours as needed for nausea or vomiting. (Patient not taking: Reported on 05/11/2020)  . [DISCONTINUED] ondansetron (ZOFRAN-ODT) 4 MG disintegrating tablet Take 1 tablet (4 mg total) by mouth every 8 (eight) hours as needed for nausea or vomiting. (Patient not  taking: Reported on 05/11/2020)  . [DISCONTINUED] traMADol (ULTRAM) 50 MG tablet Take 1 tablet (50 mg total) by mouth every 6 (six) hours as needed for moderate pain. (Patient not taking: Reported on 05/11/2020)   No facility-administered encounter medications on file as of 05/11/2020.    Surgical History: Past Surgical History:  Procedure Laterality Date  . ABDOMINAL HYSTERECTOMY    . CATARACT EXTRACTION Bilateral   . CHOLECYSTECTOMY    . COLONOSCOPY WITH PROPOFOL N/A 04/02/2015   Procedure: COLONOSCOPY WITH  PROPOFOL;  Surgeon: Josefine Class, MD;  Location[facility] ENDOSCOPY;  Service: Endoscopy;  Laterality: N/A;  . ESOPHAGOGASTRODUODENOSCOPY (EGD) WITH PROPOFOL N/A 04/02/2015   Procedure: ESOPHAGOGASTRODUODENOSCOPY (EGD) WITH PROPOFOL;  Surgeon: Josefine Class, MD;  Location: [facility] ENDOSCOPY;  Service: Endoscopy;  Laterality: N/A;  . GALLBLADDER SURGERY    . LAPAROSCOPIC HYSTERECTOMY    . THYROIDECTOMY Left   . TONSILLECTOMY    . TONSILLECTOMY Bilateral     Medical History: Past Medical History:  Diagnosis Date  . Anxiety   . Diabetes ([place])   . Difficult intravenous access   . GERD (gastroesophageal reflux disease)   . HBP (high blood pressure)   . Hyperlipidemia   . Osteoarthritis   . Parkinson disease ([place])   . Sleep apnea   . Thyroid disease     Family History: Family History  Problem Relation Age of Onset  . Asthma Father   . Cancer Sister   . Stroke Maternal Uncle   . Heart disease Maternal Uncle   . Diabetes Maternal Uncle     Social History   Socioeconomic History  . Marital status: Widowed    Spouse name: Not on file  . Number of children: Not on file  . Years of education: Not on file  . Highest education level: Not on file  Occupational History  . Not on file  Tobacco Use  . Smoking status: Never Smoker  . Smokeless tobacco: Never Used  Vaping Use  . Vaping Use: Never used  Substance and Sexual Activity  . Alcohol use: No  . Drug use: No  . Sexual activity: Not on file  Other Topics Concern  . Not on file  Social History Narrative  . Not on file   Social Determinants of Health   Financial Resource Strain: Not on file  Food Insecurity: Not on file  Transportation Needs: Not on file  Physical Activity: Not on file  Stress: Not on file  Social Connections: Not on file  Intimate Partner Violence: Not on file      Review of Systems  Constitutional: Negative for chills, diaphoresis and fatigue.  HENT: Negative for ear pain, postnasal  drip and sinus pressure.   Eyes: Negative for photophobia, discharge, redness, itching and visual disturbance.  Respiratory: Negative for cough, shortness of breath and wheezing.   Cardiovascular: Negative for chest pain, palpitations and leg swelling.  Gastrointestinal: Negative for abdominal pain, constipation, diarrhea, nausea and vomiting.  Genitourinary: Negative for dysuria and flank pain.  Musculoskeletal: Negative for arthralgias, back pain, gait problem and neck pain.  Skin: Negative for color change.  Allergic/Immunologic: Negative for environmental allergies and food allergies.  Neurological: Negative for dizziness and headaches.  Hematological: Does not bruise/bleed easily.  Psychiatric/Behavioral: Negative for agitation, behavioral problems (depression) and hallucinations.    Vital Signs: BP 132/82   Pulse 75   Temp 98 F (36.7 C)   Resp 16   Ht 5\' 4"  (1.626 m)   Wt 172  lb 9.6 oz (78.3 kg)   SpO2 99%   BMI 29.63 kg/m    Physical Exam Vitals reviewed.  Constitutional:      Appearance: Normal appearance. She is normal weight.  Cardiovascular:     Rate and Rhythm: Normal rate and regular rhythm.     Pulses: Normal pulses.     Heart sounds: Normal heart sounds.  Pulmonary:     Effort: Pulmonary effort is normal.     Breath sounds: Normal breath sounds.  Abdominal:     General: Abdomen is flat.     Palpations: Abdomen is soft.  Musculoskeletal:        General: Normal range of motion.     Cervical back: Normal range of motion.  Skin:    General: Skin is warm.  Neurological:     General: No focal deficit present.     Mental Status: She is alert and oriented to person, place, and time. Mental status is at baseline.  Psychiatric:        Mood and Affect: Mood normal.        Behavior: Behavior normal.        Thought Content: Thought content normal.        Judgment: Judgment normal.    Assessment/Plan: 1. Essential hypertension BP and HR well controlled on  present management, continue to monitor  2. Acquired hypothyroidism Will update labs and adjust dosing of levothyroxine as indicated  3. Encounter for screening mammogram for malignant neoplasm of breast - MM 3D SCREEN BREAST BILATERAL; Future  4. Dysuria Start Macrobid, adjust therapy as indicated based on culture results - CULTURE, URINE COMPREHENSIVE - POCT Urinalysis Dipstick  5. Other fatigue - CBC w/Diff/Platelet - Comprehensive Metabolic Panel (CMET) - Lipid Panel With LDL/HDL Ratio - TSH + free T4  General Counseling: Theresa Mills verbalizes understanding of the findings of todays visit and agrees with plan of treatment. I have discussed any further diagnostic evaluation that may be needed or ordered today. We also reviewed her medications today. she has been encouraged to call the office with any questions or concerns that should arise related to todays visit.    Orders Placed This Encounter  Procedures  . CULTURE, URINE COMPREHENSIVE  . MM 3D SCREEN BREAST BILATERAL  . CBC w/Diff/Platelet  . Comprehensive Metabolic Panel (CMET)  . Lipid Panel With LDL/HDL Ratio  . TSH + free T4  . POCT Urinalysis Dipstick    Meds ordered this encounter  Medications  . DISCONTD: nitrofurantoin, macrocrystal-monohydrate, (MACROBID) 100 MG capsule    Sig: Take 1 capsule (100 mg total) by mouth 2 (two) times daily.    Dispense:  20 capsule    Refill:  0    Time spent: 30 Minutes Time spent includes review of chart, medications, test results and follow-up plan with the patient.  This patient was seen by Theodoro Grist AGNP-C in Collaboration with Dr Lavera Guise as a part of collaborative care agreement     Tanna Furry. Sueann Brownley AGNP-C Internal medicine

## 2020-05-12 ENCOUNTER — Other Ambulatory Visit: Payer: Self-pay

## 2020-05-12 DIAGNOSIS — R300 Dysuria: Secondary | ICD-10-CM

## 2020-05-12 LAB — POCT URINALYSIS DIPSTICK
Bilirubin, UA: NEGATIVE
Glucose, UA: NEGATIVE
Leukocytes, UA: NEGATIVE
Nitrite, UA: POSITIVE
Protein, UA: POSITIVE — AB
Spec Grav, UA: >=1.03 — AB (ref 1.010–1.025)
Urobilinogen, UA: 0.2 E.U./dL
pH, UA: 5 (ref 5.0–8.0)

## 2020-05-12 MED ORDER — NITROFURANTOIN MONOHYD MACRO 100 MG PO CAPS: 100.0000 mg | ORAL_CAPSULE | Freq: Two times a day (BID) | ORAL | 0 refills | Status: DC

## 2020-05-15 LAB — CULTURE, URINE COMPREHENSIVE

## 2020-05-17 ENCOUNTER — Other Ambulatory Visit: Payer: Self-pay | Admitting: Hospice and Palliative Medicine

## 2020-05-17 MED ORDER — SULFAMETHOXAZOLE-TRIMETHOPRIM 800-160 MG PO TABS: 1.0000 | ORAL_TABLET | Freq: Two times a day (BID) | ORAL | 0 refills | Status: DC

## 2020-05-17 NOTE — Progress Notes (Signed)
Please call and let her know based on results from urine culture we need to switch antibiotics. She needs to stop Macrobid and start and finish course of Bactrim. I have sent new antibiotic to her pharmacy.

## 2020-05-18 ENCOUNTER — Encounter: Payer: Self-pay | Admitting: Hospice and Palliative Medicine

## 2020-05-18 ENCOUNTER — Other Ambulatory Visit: Payer: Self-pay | Admitting: Nurse Practitioner

## 2020-05-18 DIAGNOSIS — F32A Depression, unspecified: Secondary | ICD-10-CM

## 2020-05-19 ENCOUNTER — Telehealth: Payer: Self-pay

## 2020-05-19 NOTE — Telephone Encounter (Signed)
OT, and PT orders signed by provider and placed in [facility] folder.

## 2020-05-22 LAB — CBC WITH DIFFERENTIAL/PLATELET
Basophils Absolute: 0 10*3/uL (ref 0.0–0.2)
Basos: 1 %
EOS (ABSOLUTE): 0.1 10*3/uL (ref 0.0–0.4)
Eos: 2 %
Hematocrit: 42.4 % (ref 34.0–46.6)
Hemoglobin: 13.7 g/dL (ref 11.1–15.9)
Immature Grans (Abs): 0 10*3/uL (ref 0.0–0.1)
Immature Granulocytes: 0 %
Lymphocytes Absolute: 1.3 10*3/uL (ref 0.7–3.1)
Lymphs: 20 %
MCH: 30.1 pg (ref 26.6–33.0)
MCHC: 32.3 g/dL (ref 31.5–35.7)
MCV: 93 fL (ref 79–97)
Monocytes Absolute: 0.4 10*3/uL (ref 0.1–0.9)
Monocytes: 7 %
Neutrophils Absolute: 4.5 10*3/uL (ref 1.4–7.0)
Neutrophils: 70 %
Platelets: 196 10*3/uL (ref 150–450)
RBC: 4.55 x10E6/uL (ref 3.77–5.28)
RDW: 12.5 % (ref 11.7–15.4)
WBC: 6.4 10*3/uL (ref 3.4–10.8)

## 2020-05-22 LAB — LIPID PANEL WITH LDL/HDL RATIO
Cholesterol, Total: 135 mg/dL (ref 100–199)
HDL: 51 mg/dL (ref >39)
LDL Chol Calc (NIH): 68 mg/dL (ref 0–99)
LDL/HDL Ratio: 1.3 ratio (ref 0.0–3.2)
Triglycerides: 84 mg/dL (ref 0–149)
VLDL Cholesterol Cal: 16 mg/dL (ref 5–40)

## 2020-05-22 LAB — COMPREHENSIVE METABOLIC PANEL
ALT: 14 IU/L (ref 0–32)
AST: 16 IU/L (ref 0–40)
Albumin/Globulin Ratio: 2 (ref 1.2–2.2)
Albumin: 4.3 g/dL (ref 3.7–4.7)
Alkaline Phosphatase: 84 IU/L (ref 44–121)
BUN/Creatinine Ratio: 34 — ABNORMAL HIGH (ref 12–28)
BUN: 22 mg/dL (ref 8–27)
Bilirubin Total: 0.4 mg/dL (ref 0.0–1.2)
CO2: 19 mmol/L — ABNORMAL LOW (ref 20–29)
Calcium: 9.4 mg/dL (ref 8.7–10.3)
Chloride: 104 mmol/L (ref 96–106)
Creatinine, Ser: 0.64 mg/dL (ref 0.57–1.00)
Globulin, Total: 2.2 g/dL (ref 1.5–4.5)
Glucose: 101 mg/dL — ABNORMAL HIGH (ref 65–99)
Potassium: 4.7 mmol/L (ref 3.5–5.2)
Sodium: 142 mmol/L (ref 134–144)
Total Protein: 6.5 g/dL (ref 6.0–8.5)
eGFR: 94 mL/min/{1.73_m2} (ref >59)

## 2020-05-22 LAB — TSH+FREE T4
Free T4: 1.82 ng/dL — ABNORMAL HIGH (ref 0.82–1.77)
TSH: 0.128 u[IU]/mL — ABNORMAL LOW (ref 0.450–4.500)

## 2020-05-24 ENCOUNTER — Other Ambulatory Visit: Payer: Self-pay | Admitting: Hospice and Palliative Medicine

## 2020-05-24 ENCOUNTER — Telehealth: Payer: Self-pay | Admitting: Pharmacist

## 2020-05-24 DIAGNOSIS — E039 Hypothyroidism, unspecified: Secondary | ICD-10-CM

## 2020-05-24 NOTE — Progress Notes (Deleted)
Chronic Care Management Pharmacy Note  05/24/2020 Name:  Theresa Mills MRN:  [id number] DOB:  1950-04-24  Subjective: Theresa Mills is an 70 y.o. year old female who is a primary patient of Humphrey Rolls Timoteo Gaul, MD.  The CCM team was consulted for assistance with disease management and care coordination needs.    Engaged with patient by telephone for initial visit in response to provider referral for pharmacy case management and/or care coordination services.   Consent to Services:  The patient was given the following information about Chronic Care Management services today, agreed to services, and gave verbal consent: 1. CCM service includes personalized support from designated clinical staff supervised by the primary care provider, including individualized plan of care and coordination with other care providers 2. 24/7 contact phone numbers for assistance for urgent and routine care needs. 3. Service will only be billed when office clinical staff spend 20 minutes or more in a month to coordinate care. 4. Only one practitioner may furnish and bill the service in a calendar month. 5.The patient may stop CCM services at any time (effective at the end of the month) by phone call to the office staff. 6. The patient will be responsible for cost sharing (co-pay) of up to 20% of the service fee (after annual deductible is met). Patient agreed to services and consent obtained.  Patient Care Team: Lavera Guise, MD as PCP - General (Internal Medicine) Edythe Clarity, [facility] as Pharmacist (Pharmacist)  Recent office visits: None in the last 6 months  Recent consult visits: 04/26/20 Urgent [place] No information given. 12/26/19 Pamella Pert For major depressive disorder. No information given. 11/28/19 Pamella Pert For major depressive disorder. No information given.  Hospital visits: None in previous 6 months  Medication History: Metformin 3 mg 30 DS 04/28/20 Simvastatin 40 mg 30  DS 05/05/20  Objective:  Lab Results  Component Value Date   CREATININE 0.64 05/21/2020   BUN 22 05/21/2020   GFRNONAA >60 10/01/2019   GFRAA >60 10/01/2019   NA 142 05/21/2020   K 4.7 05/21/2020   CALCIUM 9.4 05/21/2020   CO2 19 (L) 05/21/2020   GLUCOSE 101 (H) 05/21/2020    Lab Results  Component Value Date/Time   HGBA1C 5.6 08/27/2019 05:01 AM   HGBA1C 5.5 08/05/2019 03:34 PM   HGBA1C 6.3 01/14/2014 06:16 AM    Last diabetic Eye exam: No results found for: HMDIABEYEEXA  Last diabetic Foot exam: No results found for: HMDIABFOOTEX   Lab Results  Component Value Date   CHOL 135 05/21/2020   HDL 51 05/21/2020   LDLCALC 68 05/21/2020   TRIG 84 05/21/2020    Hepatic Function Latest Ref Rng & Units 05/21/2020 09/30/2019 09/03/2019  Total Protein 6.0 - 8.5 g/dL 6.5 7.4 6.3(L)  Albumin 3.7 - 4.7 g/dL 4.3 3.6 2.3(L)  AST 0 - 40 IU/L 16 20 26   ALT 0 - 32 IU/L 14 6 7   Alk Phosphatase 44 - 121 IU/L 84 72 64  Total Bilirubin 0.0 - 1.2 mg/dL 0.4 1.0 0.5  Bilirubin, Direct 0.0 - 0.2 mg/dL - - -    Lab Results  Component Value Date/Time   TSH 0.128 (L) 05/21/2020 08:54 AM   TSH 13.319 (H) 08/31/2019 12:00 PM   TSH 14.706 (H) 08/27/2019 05:01 AM   TSH 0.237 (L) 01/09/2017 10:41 AM   FREET4 1.82 (H) 05/21/2020 08:54 AM   FREET4 0.90 08/31/2019 12:00 PM    CBC Latest Ref  Rng & Units 05/21/2020 10/01/2019 09/30/2019  WBC 3.4 - 10.8 x10E3/uL 6.4 6.2 9.7  Hemoglobin 11.1 - 15.9 g/dL 13.7 10.9(L) 12.3  Hematocrit 34.0 - 46.6 % 42.4 34.2(L) 37.2  Platelets 150 - 450 x10E3/uL 196 195 209    No results found for: VD25OH  Clinical ASCVD: {YES/NO:21197} The ASCVD Risk score Mikey Bussing DC Jr., et al., 2013) failed to calculate for the following reasons:   The patient has a prior MI or stroke diagnosis    Depression screen [facility] 2/9 06/24/2019 02/10/2019 09/12/2018  Decreased Interest 0 0 1  Down, Depressed, Hopeless 1 1 1   PHQ - 2 Score 1 1 2   Altered sleeping - - 0  Tired, decreased energy -  - 1  Change in appetite - - 2  Feeling bad or failure about yourself  - - 1  Trouble concentrating - - 3  Moving slowly or fidgety/restless - - 1  Suicidal thoughts - - 0  PHQ-9 Score - - 10  Difficult doing work/chores - - Somewhat difficult     ***Other: (CHADS2VASc if Afib, MMRC or CAT for COPD, ACT, DEXA)  Social History   Tobacco Use  Smoking Status Never Smoker  Smokeless Tobacco Never Used   BP Readings from Last 3 Encounters:  05/11/20 132/82  10/01/19 112/60  09/17/19 (!) 158/92   Pulse Readings from Last 3 Encounters:  05/11/20 75  10/01/19 80  09/17/19 (!) 108   Wt Readings from Last 3 Encounters:  05/11/20 172 lb 9.6 oz (78.3 kg)  09/30/19 120 lb (54.4 kg)  09/03/19 169 lb (76.7 kg)   BMI Readings from Last 3 Encounters:  05/11/20 29.63 kg/m  09/30/19 20.60 kg/m  09/03/19 29.01 kg/m    Assessment/Interventions: Review of patient past medical history, allergies, medications, health status, including review of consultants reports, laboratory and other test data, was performed as part of comprehensive evaluation and provision of chronic care management services.   SDOH:  (Social Determinants of Health) assessments and interventions performed: {yes/no:20286}  SDOH Screenings   Alcohol Screen: Not on file  Depression (PHQ2-9): Low Risk   . PHQ-2 Score: 1  Financial Resource Strain: Not on file  Food Insecurity: Not on file  Housing: Not on file  Physical Activity: Not on file  Social Connections: Not on file  Stress: Not on file  Tobacco Use: Low Risk   . Smoking Tobacco Use: Never Smoker  . Smokeless Tobacco Use: Never Used  Transportation Needs: Not on file    CCM Care Plan  Allergies  Allergen Reactions  . Codeine Other (See Comments)    HALLUCINATIONS  . Ephadrene [Cholestatin] Other (See Comments)    HYPER AND NERVOUS  . Other Other (See Comments)    HYPER  HYPER AND NERVOUS  . Valium [Diazepam] Other (See Comments)    OVERLY  SENSITIVE/TOO STRONG    Medications Reviewed Today    Reviewed by Luiz Ochoa, NP (Nurse Practitioner) on 05/18/20 at 2053  Med List Status: <None>  Medication Order Taking? Sig Documenting Provider Last Dose Status Informant  Calcium Carbonate-Vitamin D 600-200 MG-UNIT TABS [id number] Yes Take 1 tablet by mouth daily.  [provider] Taking Active Nursing Home Medication Administration Guide (MAG)  carbidopa-levodopa (SINEMET CR) 50-200 MG tablet [id number] Yes Take 1 tablet by mouth 2 (two) times daily.  [provider] Taking Active Nursing Home Medication Administration Guide (MAG)  carbidopa-levodopa (SINEMET IR) 25-100 MG tablet [id number] Yes Take 2 tablets by mouth 4 (four)  times daily. [provider] Taking Active Nursing Home Medication Administration Guide (MAG)  celecoxib (CELEBREX) 200 MG capsule [id number] Yes Take 1 capsule (200 mg total) by mouth 2 (two) times daily as needed. Ronnell Freshwater, NP Taking Active Nursing Home Medication Administration Guide (MAG)  docusate sodium (COLACE) 100 MG capsule [id number] Yes Take 1 capsule (100 mg total) by mouth 2 (two) times daily. Fritzi Mandes, MD Taking Active Nursing Home Medication Administration Guide (MAG)  glucose blood test strip [id number] Yes TEST BLOOD SUGAR BID [provider] Taking Active Nursing Home Medication Administration Guide (MAG)  levothyroxine (SYNTHROID) 125 MCG tablet [id number] Yes Take 1.5 tablets (188 mcg total) by mouth daily at 6 (six) AM. Barb Merino, MD Taking Active Nursing Home Medication Administration Guide (MAG)  Melatonin 3 MG TABS [id number] Yes Take 3 mg by mouth at bedtime as needed (sleep).  [provider] Taking Active Nursing Home Medication Administration Guide (MAG)  metFORMIN (GLUCOPHAGE) 500 MG tablet [id number] Yes Take 500 mg by mouth 2 (two) times daily with a meal.  [provider] Taking Active Nursing Home Medication Administration  Guide (MAG)  Multiple Vitamins-Minerals (MULTIVITAMIN WOMEN 50+ PO) [id number] Yes Take 1 tablet by mouth daily.  [provider] Taking Active Nursing Home Medication Administration Guide (MAG)  omeprazole (PRILOSEC) 40 MG capsule [id number] Yes Take 1 capsule (40 mg total) by mouth daily. Ronnell Freshwater, NP Taking Active Nursing Home Medication Administration Guide (MAG)  ONETOUCH DELICA LANCETS 33H [place] [id number] Yes TEST BLOOD SUGAR BID [provider] Taking Active Nursing Home Medication Administration Guide (MAG)  rOPINIRole (REQUIP XL) 2 MG 24 hr tablet [id number] Yes Take 2 mg by mouth at bedtime.  [provider] Taking Active Nursing Home Medication Administration Guide (MAG)  sertraline (ZOLOFT) 50 MG tablet [id number] Yes Take 1 tablet (50 mg total) by mouth daily. Ronnell Freshwater, NP Taking Active Nursing Home Medication Administration Guide (MAG)  simvastatin (ZOCOR) 40 MG tablet [id number] Yes Take 1 tablet (40 mg total) by mouth at bedtime. Ronnell Freshwater, NP Taking Active Nursing Home Medication Administration Guide (MAG)  sulfamethoxazole-trimethoprim (BACTRIM DS) 800-160 MG tablet [id number]  Take 1 tablet by mouth 2 (two) times daily. Luiz Ochoa, NP  Active           Patient Active Problem List   Diagnosis Date Noted  . Back pain 09/30/2019  . Epidural abscess 09/30/2019  . Psoas abscess, right ([place]) 09/30/2019  . High anion gap metabolic acidosis 32/12/2480  . Lumbar compression fracture ([place]) 08/25/2019  . Osteomyelitis of lumbar vertebra ([place]) 08/25/2019  . Discitis 08/25/2019  . Malnutrition of moderate degree 08/20/2019  . Severe sepsis ([place]) 08/19/2019  . Bilateral hydronephrosis 08/19/2019  . Acute pyelonephritis 08/19/2019  . Acute metabolic encephalopathy 50/03/7046  . Acute urinary retention 08/19/2019  . AKI (acute kidney injury) ([place]) 08/19/2019  . Osteoarthritis of multiple joints   . Frequent falls 08/05/2019  .  Urinary tract infection with hematuria 06/11/2019  . Nausea and vomiting 02/13/2019  . Acquired hypothyroidism 02/13/2019  . Acquired bilateral hammer toes 01/23/2019  . Type 2 diabetes mellitus with hyperglycemia ([place]) 09/15/2018  . Encounter for general adult medical examination with abnormal findings 09/05/2017  . Dysuria 09/05/2017  . Uncontrolled type 2 diabetes mellitus with hyperglycemia ([place]) 06/18/2017  . Rib fractures 06/11/2017  . Pressure injury of coccygeal region, stage 2 ([place]) 06/11/2017  . Rhabdomyolysis 06/10/2017  . Elevated troponin 06/10/2017  . Hypernatremia 06/10/2017  .  Fall 06/10/2017  . Arthritis 01/08/2017  . Cataracts, bilateral 01/08/2017  . Chickenpox 01/08/2017  . Shingles 01/08/2017  . Depression 01/08/2017  . Diabetes mellitus type 2, uncomplicated ([place]) 79/48/0165  . Hyperlipidemia, unspecified 01/08/2017  . Essential hypertension 01/08/2017  . Migraine headache 01/08/2017  . Thyroid disease 01/08/2017  . Parkinson disease ([place]) 01/08/2017  . Anxiety, generalized 09/01/2016  . Dementia due to Parkinson's disease without behavioral disturbance ([place]) 09/01/2016  . History of depression 09/01/2016  . Morbid obesity with BMI of 40.0-44.9, adult ([place]) 09/01/2016  . Restless leg syndrome 09/01/2016    Immunization History  Administered Date(s) Administered  . DTaP 03/15/2014  . Influenza, High Dose Seasonal PF 10/30/2017, 11/06/2018  . Influenza-Unspecified 11/17/2016  . Moderna Sars-Covid-2 Vaccination 03/19/2019, 04/16/2019  . Pneumococcal Conjugate-13 12/09/2014  . Pneumococcal-Unspecified 01/19/2009  . Zoster 01/20/2015    Conditions to be addressed/monitored:  HTN, DM II, HLD, Depression, Anxiety, Hypothyroidism  There are no care plans that you recently modified to display for this patient.    Medication Assistance: {MEDASSISTANCEINFO:25044}  Patient's preferred pharmacy is:  [facility], [place] - [address] [facility] [address] [place] [address] Phone: [phone] Fax: [phone]  [place], [place] - [address] [place] [address] Phone: [phone] Fax: [phone]  Uses pill box? {Yes or If no, why not?:20788} Pt endorses ***% compliance  We discussed: {Pharmacy options:24294} Patient decided to: {US Pharmacy Plan:23885}  Care Plan and Follow Up Patient Decision:  {FOLLOWUP:24991}  Plan: {CM FOLLOW UP GSUP:10315}  ***  Current Barriers:  . {pharmacybarriers:24917}  Pharmacist Clinical Goal(s):  Marland Kitchen Patient will {PHARMACYGOALCHOICES:24921} through collaboration with PharmD and provider.   Interventions: . 1:1 collaboration with Lavera Guise, MD regarding development and update of comprehensive plan of care as evidenced by provider attestation and co-signature . Inter-disciplinary care team collaboration (see longitudinal plan of care) . Comprehensive medication review performed; medication list updated in electronic medical record  Hypertension (BP goal {CHL HP UPSTREAM Pharmacist BP ranges:[phone]}) -{US controlled/uncontrolled:25276} -Current treatment: . *** -Medications previously tried: ***  -Current home readings: *** -Current dietary habits: *** -Current exercise habits: *** -{ACTIONS;DENIES/REPORTS:21021675::"Denies"} hypotensive/hypertensive symptoms -Educated on {CCM BP Counseling:25124} -Counseled to monitor BP at home ***, document, and provide log at future appointments -{CCMPHARMDINTERVENTION:25122}  Hyperlipidemia: (LDL goal < ***) -{US controlled/uncontrolled:25276} -Current treatment: . Levothyroxine 120mg daily -Medications previously tried: ***  -Current dietary patterns: *** -Current exercise habits: *** -Educated on {CCM HLD Counseling:25126} -{CCMPHARMDINTERVENTION:25122}  Diabetes (A1c goal {A1c goals:23924}) -{US controlled/uncontrolled:25276} -Current medications: . *** -Medications  previously tried: ***  -Current home glucose readings . fasting glucose: *** . post prandial glucose: *** -{ACTIONS;DENIES/REPORTS:21021675::"Denies"} hypoglycemic/hyperglycemic symptoms -Current meal patterns:  . breakfast: ***  . lunch: ***  . dinner: *** . snacks: *** . drinks: *** -Current exercise: *** -Educated on {CCM DM COUNSELING:25123} -Counseled to check feet daily and get yearly eye exams -{CCMPHARMDINTERVENTION:25122}  Depression/Anxiety (Goal: ***) -{US controlled/uncontrolled:25276} -Current treatment: . *** -Medications previously tried/failed: *** -PHQ9: *** -GAD7: *** -Connected with *** for mental health support -Educated on {CCM mental health counseling:25127} -{CCMPHARMDINTERVENTION:25122}  Hypothyroidism (Goal: ***) -{US controlled/uncontrolled:25276} -Current treatment  . *** -Medications previously tried: ***  -{CCMPHARMDINTERVENTION:25122}    Patient Goals/Self-Care Activities . Patient will:  - {pharmacypatientgoals:24919}  Follow Up Plan: {CM FOLLOW UP PXYVO:59292}

## 2020-05-24 NOTE — Progress Notes (Signed)
Please call and let patient know I have reviewed her labs. Based on thyroid results we need to lower her levothyroxine dose. Currently she is taking 1.5 tablets--please advise her to start 1 tablet daily and will need to repeat her labs in 6 weeks. All orders have been placed.

## 2020-05-24 NOTE — Progress Notes (Addendum)
Chronic Care Management Pharmacy Assistant   Name: FALESHA SCHOMMER  MRN: [id number] DOB: [birth year]  Theresa Mills is an 70 y.o. year old female who presents for his initial CCM visit with the clinical pharmacist.  Reason for Encounter: Chart Prep   Conditions to be addressed/monitored: HTN, DM II, HLD, Depression, Anxiety.  Primary concerns for visit include: Depression   Recent office visits:  None in the last 6 months  Recent consult visits:  04/26/20 Urgent [place] No information given. 12/26/19 [facility]ella Pert For major depressive disorder. No information given. 11/28/19 [facility]ella Pert For major depressive disorder. No information given.  Hospital visits:  None in previous 6 months  Medication History: Metformin 3 mg 30 DS 04/28/20 Simvastatin 40 mg 30 DS 05/05/20  Medications: Outpatient Encounter Medications as of 05/24/2020  Medication Sig   sulfamethoxazole-trimethoprim (BACTRIM DS) 800-160 MG tablet Take 1 tablet by mouth 2 (two) times daily.   Calcium Carbonate-Vitamin D 600-200 MG-UNIT TABS Take 1 tablet by mouth daily.    carbidopa-levodopa (SINEMET CR) 50-200 MG tablet Take 1 tablet by mouth 2 (two) times daily.    carbidopa-levodopa (SINEMET IR) 25-100 MG tablet Take 2 tablets by mouth 4 (four) times daily.   celecoxib (CELEBREX) 200 MG capsule Take 1 capsule (200 mg total) by mouth 2 (two) times daily as needed.   docusate sodium (COLACE) 100 MG capsule Take 1 capsule (100 mg total) by mouth 2 (two) times daily.   glucose blood test strip TEST BLOOD SUGAR BID   levothyroxine (SYNTHROID) 125 MCG tablet Take 125 mcg by mouth daily before breakfast.   Melatonin 3 MG TABS Take 3 mg by mouth at bedtime as needed (sleep).    metFORMIN (GLUCOPHAGE) 500 MG tablet Take 500 mg by mouth 2 (two) times daily with a meal.    Multiple Vitamins-Minerals (MULTIVITAMIN WOMEN 50+ PO) Take 1 tablet by mouth daily.    omeprazole (PRILOSEC) 40 MG capsule Take 1  capsule (40 mg total) by mouth daily.   ONETOUCH DELICA LANCETS 17O MISC TEST BLOOD SUGAR BID   rOPINIRole (REQUIP XL) 2 MG 24 hr tablet Take 2 mg by mouth at bedtime.    sertraline (ZOLOFT) 50 MG tablet Take 1 tablet (50 mg total) by mouth daily.   simvastatin (ZOCOR) 40 MG tablet Take 1 tablet (40 mg total) by mouth at bedtime.   No facility-administered encounter medications on file as of 05/24/2020.    Have you seen any other providers since your last visit? Patient stated she's been to [facility].  Any changes in your medications or health? Patient stated no.  Any side effects from any medications? Patient stated no.  Do you have an symptoms or problems not managed by your medications? Patient stated no.  Any concerns about your health right now? Patient stated no.   Has your provider asked that you check blood pressure, blood sugar, or follow special diet at home? Patient stated no.  Do you get any type of exercise on a regular basis? Patient stated she does physical therapy.   Can you think of a goal you would like to reach for your health? Patient stated to be more stronger.  Do you have any problems getting your medications? Patient stated no.  Is there anything that you would like to discuss during the appointment? Patient stated no.  Please bring medications and supplements to appointment, patient reminded of OTP appointment on 05/25/20 at 3 pm.  Follow-Up:Pharmacist  Review  Charlann Lange, [place] Clinical Pharmacist Assistant [phone]

## 2020-05-25 ENCOUNTER — Ambulatory Visit: Payer: Medicare Other

## 2020-05-26 ENCOUNTER — Other Ambulatory Visit: Payer: Self-pay | Admitting: Internal Medicine

## 2020-05-26 ENCOUNTER — Other Ambulatory Visit: Payer: Self-pay | Admitting: Nurse Practitioner

## 2020-05-27 LAB — CULTURE, URINE COMPREHENSIVE

## 2020-05-31 ENCOUNTER — Telehealth: Payer: Self-pay

## 2020-06-01 ENCOUNTER — Other Ambulatory Visit: Payer: Self-pay

## 2020-06-01 ENCOUNTER — Other Ambulatory Visit: Payer: Self-pay | Admitting: Internal Medicine

## 2020-06-01 DIAGNOSIS — R0902 Hypoxemia: Secondary | ICD-10-CM

## 2020-06-01 NOTE — Telephone Encounter (Signed)
Ordered

## 2020-06-01 NOTE — Telephone Encounter (Signed)
Pt advised  that we order pulse OX and she if her result is ok then we can let [facility] pickup oxygen and also spoke with [facility] that we put order in the epic and also send him message

## 2020-06-02 ENCOUNTER — Other Ambulatory Visit: Payer: Self-pay | Admitting: Internal Medicine

## 2020-06-02 ENCOUNTER — Other Ambulatory Visit: Payer: Self-pay | Admitting: Nurse Practitioner

## 2020-06-02 DIAGNOSIS — F32A Depression, unspecified: Secondary | ICD-10-CM

## 2020-06-10 ENCOUNTER — Telehealth: Payer: Self-pay

## 2020-06-10 NOTE — Telephone Encounter (Signed)
OT order signed by provider and contacted [facility] advising order was ready to be picked up at front desk. Theresa Mills

## 2020-06-16 ENCOUNTER — Telehealth: Payer: Self-pay

## 2020-06-17 ENCOUNTER — Other Ambulatory Visit: Payer: Self-pay | Admitting: Internal Medicine

## 2020-06-17 MED ORDER — CLOTRIMAZOLE-BETAMETHASONE 1-0.05 % EX CREA: 1.0000 "application " | TOPICAL_CREAM | Freq: Two times a day (BID) | CUTANEOUS | 0 refills | Status: DC

## 2020-06-17 NOTE — Telephone Encounter (Signed)
Spoke to pt, informed her meds have been sent in and if rash doesn't improve then she needs to be seen in the office

## 2020-06-17 NOTE — Telephone Encounter (Signed)
I sent Lotrisone RX for her, pls notify, she will need to be seen if no improvement seen

## 2020-06-22 DIAGNOSIS — E1165 Type 2 diabetes mellitus with hyperglycemia: Secondary | ICD-10-CM | POA: Diagnosis not present

## 2020-06-22 DIAGNOSIS — E119 Type 2 diabetes mellitus without complications: Secondary | ICD-10-CM | POA: Diagnosis not present

## 2020-06-30 ENCOUNTER — Telehealth: Payer: Self-pay

## 2020-06-30 NOTE — Telephone Encounter (Signed)
Plan of care signed by provider and placed in [facility] folder at Sharad Vaneaton's desk ready for pickup. Loma Sousa

## 2020-07-01 ENCOUNTER — Other Ambulatory Visit: Payer: Self-pay | Admitting: Internal Medicine

## 2020-07-06 ENCOUNTER — Other Ambulatory Visit: Payer: Self-pay | Admitting: Internal Medicine

## 2020-07-08 ENCOUNTER — Ambulatory Visit (INDEPENDENT_AMBULATORY_CARE_PROVIDER_SITE_OTHER): Payer: Medicare Other | Admitting: Nurse Practitioner

## 2020-07-08 ENCOUNTER — Ambulatory Visit: Payer: Medicare Other | Admitting: Physician Assistant

## 2020-07-08 ENCOUNTER — Encounter: Payer: Self-pay | Admitting: Nurse Practitioner

## 2020-07-08 ENCOUNTER — Other Ambulatory Visit: Payer: Self-pay

## 2020-07-08 VITALS — BP 110/82 | HR 68 | Temp 97.4°F | Resp 16 | Ht 63.0 in | Wt 172.8 lb

## 2020-07-08 DIAGNOSIS — G20 Parkinson's disease: Secondary | ICD-10-CM

## 2020-07-08 DIAGNOSIS — B3731 Acute candidiasis of vulva and vagina: Secondary | ICD-10-CM

## 2020-07-08 DIAGNOSIS — E1165 Type 2 diabetes mellitus with hyperglycemia: Secondary | ICD-10-CM

## 2020-07-08 DIAGNOSIS — I10 Essential (primary) hypertension: Secondary | ICD-10-CM

## 2020-07-08 DIAGNOSIS — B373 Candidiasis of vulva and vagina: Secondary | ICD-10-CM | POA: Diagnosis not present

## 2020-07-08 MED ORDER — CLOTRIMAZOLE-BETAMETHASONE 1-0.05 % EX CREA: 1.0000 "application " | TOPICAL_CREAM | Freq: Two times a day (BID) | CUTANEOUS | 1 refills | Status: DC

## 2020-07-08 NOTE — Progress Notes (Signed)
[facility] [place], [place] [address]  Internal MEDICINE  Office Visit Note  Patient Name: Theresa Mills  [id number]  [id number]  Date of Service: 07/17/2020  Chief Complaint  Patient presents with   Follow-up    Red area on vaginal area    Diabetes    Pt sees endo   Gastroesophageal Reflux   Sleep Apnea   Hypertension   Hyperlipidemia   Anxiety   Quality Metric Gaps    Shingrix,eye exam scheduled, foot exam, covid booster    HPI Clinton presents for a follow up visit for GERD, sleep apnea, hypertension, hyperlipidemia, and anxiety.  Currently, her vaginal area has been itchy and red per her aide who accompanied her to her office visit.  -blood pressure is within normal limits and she is not currently on any medications for hypertension.  -taking simvastatin for hyperlipidemia.  -taking sinemet for parkinsons disease.  -Anxiety well controlled with current medications.  No other questions or concerns, denies any pain.    Current Medication: Outpatient Encounter Medications as of 07/08/2020  Medication Sig   Calcium Carbonate-Vitamin D 600-200 MG-UNIT TABS Take 1 tablet by mouth daily.    carbidopa-levodopa (SINEMET CR) 50-200 MG tablet Take 1 tablet by mouth 2 (two) times daily.    carbidopa-levodopa (SINEMET IR) 25-100 MG tablet Take 2 tablets by mouth 4 (four) times daily.   celecoxib (CELEBREX) 200 MG capsule TAKE 1 CAPSULE BY MOUTH TWICE DAILY AS NEEDED   docusate sodium (COLACE) 100 MG capsule Take 1 capsule (100 mg total) by mouth 2 (two) times daily.   glucose blood test strip TEST BLOOD SUGAR BID   Melatonin 3 MG TABS Take 3 mg by mouth at bedtime as needed (sleep).    metFORMIN (GLUCOPHAGE) 500 MG tablet TAKE 1 TABLET BY MOUTH TWICE DAILY   Multiple Vitamins-Minerals (MULTIVITAMIN WOMEN 50+ PO) Take 1 tablet by mouth daily.    omeprazole (PRILOSEC) 40 MG capsule TAKE 1 CAPSULE BY MOUTH EVERY DAY   ONETOUCH DELICA LANCETS 28B MISC TEST  BLOOD SUGAR BID   rOPINIRole (REQUIP XL) 2 MG 24 hr tablet Take 2 mg by mouth at bedtime.    sertraline (ZOLOFT) 50 MG tablet TAKE ONE AND A HALF TABLETS BY MOUTH EVERY DAY   simvastatin (ZOCOR) 40 MG tablet Take one tab po qhs for cholesterol   [DISCONTINUED] clotrimazole-betamethasone (LOTRISONE) cream Apply 1 application topically 2 (two) times daily.   [DISCONTINUED] levothyroxine (SYNTHROID) 125 MCG tablet Take 125 mcg by mouth daily before breakfast.   [DISCONTINUED] sulfamethoxazole-trimethoprim (BACTRIM DS) 800-160 MG tablet Take 1 tablet by mouth 2 (two) times daily.   clotrimazole-betamethasone (LOTRISONE) cream Apply 1 application topically 2 (two) times daily.   No facility-administered encounter medications on file as of 07/08/2020.    Surgical History: Past Surgical History:  Procedure Laterality Date   ABDOMINAL HYSTERECTOMY     CATARACT EXTRACTION Bilateral    CHOLECYSTECTOMY     COLONOSCOPY WITH PROPOFOL N/A 04/02/2015   Procedure: COLONOSCOPY WITH PROPOFOL;  Surgeon: Josefine Class, MD;  Location: [facility] ENDOSCOPY;  Service: Endoscopy;  Laterality: N/A;   ESOPHAGOGASTRODUODENOSCOPY (EGD) WITH PROPOFOL N/A 04/02/2015   Procedure: ESOPHAGOGASTRODUODENOSCOPY (EGD) WITH PROPOFOL;  Surgeon: Josefine Class, MD;  Location: [facility] ENDOSCOPY;  Service: Endoscopy;  Laterality: N/A;   GALLBLADDER SURGERY     LAPAROSCOPIC HYSTERECTOMY     THYROIDECTOMY Left    TONSILLECTOMY     TONSILLECTOMY Bilateral     Medical History: Past  Medical History:  Diagnosis Date   Anxiety    Diabetes ([place])    Difficult intravenous access    GERD (gastroesophageal reflux disease)    HBP (high blood pressure)    Hyperlipidemia    Osteoarthritis    Parkinson disease ([place])    Sleep apnea    Thyroid disease     Family History: Family History  Problem Relation Age of Onset   Asthma Father    Cancer Sister    Stroke Maternal Uncle    Heart disease Maternal Uncle    Diabetes Maternal  Uncle     Social History   Socioeconomic History   Marital status: Widowed    Spouse name: Not on file   Number of children: Not on file   Years of education: Not on file   Highest education level: Not on file  Occupational History   Not on file  Tobacco Use   Smoking status: Never   Smokeless tobacco: Never  Vaping Use   Vaping Use: Never used  Substance and Sexual Activity   Alcohol use: No   Drug use: No   Sexual activity: Not on file  Other Topics Concern   Not on file  Social History Narrative   Not on file   Social Determinants of Health   Financial Resource Strain: Not on file  Food Insecurity: Not on file  Transportation Needs: Not on file  Physical Activity: Not on file  Stress: Not on file  Social Connections: Not on file  Intimate Partner Violence: Not on file      Review of Systems  Constitutional:  Negative for chills, fatigue and unexpected weight change.  HENT:  Positive for postnasal drip. Negative for congestion, rhinorrhea, sneezing and sore throat.   Eyes:  Negative for redness.  Respiratory:  Negative for cough, chest tightness and shortness of breath.   Cardiovascular:  Negative for chest pain and palpitations.  Gastrointestinal:  Negative for abdominal pain, constipation, diarrhea, nausea and vomiting.  Genitourinary:  Positive for vaginal discharge. Negative for dysuria and frequency.       Vaginal itching and redness.  Musculoskeletal:  Negative for arthralgias, back pain, joint swelling and neck pain.  Skin:  Negative for rash.  Neurological: Negative.  Negative for tremors and numbness.  Hematological:  Negative for adenopathy. Does not bruise/bleed easily.  Psychiatric/Behavioral:  Negative for behavioral problems (Depression), sleep disturbance and suicidal ideas. The patient is not nervous/anxious.    Vital Signs: BP 110/82   Pulse 68   Temp (!) 97.4 F (36.3 C)   Resp 16   Ht 5\' 3"  (1.6 m)   Wt 172 lb 12.8 oz (78.4 kg)    SpO2 98%   BMI 30.61 kg/m    Physical Exam Vitals reviewed.  Constitutional:      General: She is not in acute distress.    Appearance: She is obese. She is not ill-appearing.  HENT:     Head: Normocephalic and atraumatic.  Cardiovascular:     Rate and Rhythm: Normal rate and regular rhythm.     Pulses: Normal pulses.     Heart sounds: Normal heart sounds.  Pulmonary:     Effort: Pulmonary effort is normal.     Breath sounds: Normal breath sounds.  Skin:    General: Skin is warm and dry.     Capillary Refill: Capillary refill takes less than 2 seconds.  Neurological:     Mental Status: She is alert and oriented to person,  place, and time.     Motor: Weakness present.  Psychiatric:        Mood and Affect: Affect is flat.        Behavior: Behavior is slowed and withdrawn. Behavior is cooperative.    Assessment/Plan: 1. Vulvovaginal candidiasis Vaginal yeast infection, lotrisone ordered.  - clotrimazole-betamethasone (LOTRISONE) cream; Apply 1 application topically 2 (two) times daily.  Dispense: 45 g; Refill: 1  2. Essential hypertension Blood pressure within normal limits, patient is not currently taking any antihypertensive medications  3. Parkinson disease ([place]) Managed by neurology, taking sinemet  4. Type 2 diabetes mellitus with hyperglycemia, without long-term current use of insulin (HCC) Taking metformin, managed by endocrinology.    General Counseling: donette mainwaring understanding of the findings of todays visit and agrees with plan of treatment. I have discussed any further diagnostic evaluation that may be needed or ordered today. We also reviewed her medications today. she has been encouraged to call the office with any questions or concerns that should arise related to todays visit.    No orders of the defined types were placed in this encounter.   Meds ordered this encounter  Medications   clotrimazole-betamethasone (LOTRISONE) cream    Sig: Apply  1 application topically 2 (two) times daily.    Dispense:  45 g    Refill:  1    Return in about 2 months (around 09/07/2020) for F/U, med refill, Keilin Gamboa PCP.   Total time spent:30 Minutes Time spent includes review of chart, medications, test results, and follow up plan with the patient.   [place] Controlled Substance Database was reviewed by me.  This patient was seen by Jonetta Osgood, FNP-C in collaboration with Dr. Clayborn Bigness as a part of collaborative care agreement.   Laron Angelini R. Valetta Fuller, MSN, FNP-C Internal medicine

## 2020-07-15 ENCOUNTER — Other Ambulatory Visit: Payer: Self-pay | Admitting: Internal Medicine

## 2020-07-22 ENCOUNTER — Other Ambulatory Visit: Payer: Self-pay | Admitting: Internal Medicine

## 2020-07-22 NOTE — Telephone Encounter (Signed)
Ok to fill can you please review

## 2020-07-29 ENCOUNTER — Other Ambulatory Visit: Payer: Self-pay | Admitting: Internal Medicine

## 2020-08-04 ENCOUNTER — Telehealth: Payer: Self-pay

## 2020-08-04 NOTE — Telephone Encounter (Signed)
Error

## 2020-08-04 NOTE — Telephone Encounter (Signed)
Verbal order for PT received. Given to provider for signature-Toni

## 2020-08-12 ENCOUNTER — Other Ambulatory Visit: Payer: Self-pay

## 2020-08-12 MED ORDER — CARBIDOPA-LEVODOPA ER 50-200 MG PO TBCR
1.0000 | EXTENDED_RELEASE_TABLET | Freq: Two times a day (BID) | ORAL | 1 refills | Status: DC
Start: 2020-08-12 — End: 2021-05-13

## 2020-08-12 MED ORDER — CARBIDOPA-LEVODOPA 25-100 MG PO TABS: 2.0000 | ORAL_TABLET | Freq: Four times a day (QID) | ORAL | 1 refills | Status: DC

## 2020-08-18 ENCOUNTER — Other Ambulatory Visit: Payer: Self-pay | Admitting: Internal Medicine

## 2020-09-03 ENCOUNTER — Other Ambulatory Visit: Payer: Self-pay | Admitting: Internal Medicine

## 2020-09-03 ENCOUNTER — Telehealth: Payer: Self-pay

## 2020-09-03 DIAGNOSIS — Z1231 Encounter for screening mammogram for malignant neoplasm of breast: Secondary | ICD-10-CM

## 2020-09-03 NOTE — Telephone Encounter (Signed)
Patient called needing new mammogram order to be sent in. Notified Alyssa-Toni

## 2020-09-09 ENCOUNTER — Other Ambulatory Visit: Payer: Self-pay

## 2020-09-09 ENCOUNTER — Ambulatory Visit (INDEPENDENT_AMBULATORY_CARE_PROVIDER_SITE_OTHER): Payer: Medicare Other | Admitting: Physician Assistant

## 2020-09-09 ENCOUNTER — Telehealth: Payer: Self-pay

## 2020-09-09 ENCOUNTER — Other Ambulatory Visit: Payer: Self-pay | Admitting: Internal Medicine

## 2020-09-09 ENCOUNTER — Encounter: Payer: Self-pay | Admitting: Physician Assistant

## 2020-09-09 ENCOUNTER — Ambulatory Visit: Payer: Medicare Other | Admitting: Podiatry

## 2020-09-09 DIAGNOSIS — R42 Dizziness and giddiness: Secondary | ICD-10-CM

## 2020-09-09 DIAGNOSIS — I10 Essential (primary) hypertension: Secondary | ICD-10-CM | POA: Diagnosis not present

## 2020-09-09 DIAGNOSIS — F32A Depression, unspecified: Secondary | ICD-10-CM

## 2020-09-09 DIAGNOSIS — E039 Hypothyroidism, unspecified: Secondary | ICD-10-CM

## 2020-09-09 DIAGNOSIS — R531 Weakness: Secondary | ICD-10-CM | POA: Diagnosis not present

## 2020-09-09 DIAGNOSIS — G20 Parkinson's disease: Secondary | ICD-10-CM

## 2020-09-09 DIAGNOSIS — E1165 Type 2 diabetes mellitus with hyperglycemia: Secondary | ICD-10-CM | POA: Diagnosis not present

## 2020-09-09 DIAGNOSIS — R300 Dysuria: Secondary | ICD-10-CM

## 2020-09-09 LAB — GLUCOSE, POCT (MANUAL RESULT ENTRY): POC Glucose: 131 mg/dl — AB (ref 70–99)

## 2020-09-09 MED ORDER — CIPROFLOXACIN HCL 500 MG PO TABS: 500.0000 mg | ORAL_TABLET | Freq: Two times a day (BID) | ORAL | 0 refills | Status: AC

## 2020-09-09 MED ORDER — SERTRALINE HCL 100 MG PO TABS: 100.0000 mg | ORAL_TABLET | Freq: Every day | ORAL | 3 refills | Status: DC

## 2020-09-09 NOTE — Progress Notes (Signed)
[facility] [place], [place] [address]  Internal MEDICINE  Office Visit Note  Patient Name: Theresa Mills  [id number]  [id number]  Date of Service: 09/15/2020  Chief Complaint  Patient presents with   vulvovaginal candidiasis    2 month fup.  Not having discomfort now when she urinates    HPI Pt is here for routine follow up -She is not feeling well at the moment and states she is having some vertigo and feels tired. States she has had this before and that she has not had much to eat or drink today as she had another appointment before this today and is not used to being up and out of the house much. Pt given granola bar and water and started to feel better. POCT BG was 131 and caregiver notified. -Taking tylenol for arthritis -Vulvovaginal candidiasis has resolved -synthroid dose adjusted by Dr. Ronnald Collum and she states he did lab work, will obtain office notes -She continues to take sinemet for PD  Current Medication: Outpatient Encounter Medications as of 09/09/2020  Medication Sig   Calcium Carbonate-Vitamin D 600-200 MG-UNIT TABS Take 1 tablet by mouth daily.    carbidopa-levodopa (SINEMET CR) 50-200 MG tablet Take 1 tablet by mouth 2 (two) times daily.   carbidopa-levodopa (SINEMET IR) 25-100 MG tablet Take 2 tablets by mouth 4 (four) times daily.   celecoxib (CELEBREX) 200 MG capsule TAKE 1 CAPSULE BY MOUTH TWICE DAILY AS NEEDED   ciprofloxacin (CIPRO) 500 MG tablet Take 1 tablet (500 mg total) by mouth 2 (two) times daily for 10 days.   clotrimazole-betamethasone (LOTRISONE) cream Apply 1 application topically 2 (two) times daily.   docusate sodium (COLACE) 100 MG capsule Take 1 capsule (100 mg total) by mouth 2 (two) times daily.   glucose blood test strip TEST BLOOD SUGAR BID   levothyroxine (SYNTHROID) 100 MCG tablet Take 100 mcg by mouth daily before breakfast. Was changed 09/09/20 by Dr Ronnald Collum.  Was taking 88 mcg   Melatonin 3 MG TABS Take 3  mg by mouth at bedtime as needed (sleep).    metFORMIN (GLUCOPHAGE) 500 MG tablet TAKE 1 TABLET BY MOUTH TWICE DAILY   Multiple Vitamins-Minerals (MULTIVITAMIN WOMEN 50+ PO) Take 1 tablet by mouth daily.    omeprazole (PRILOSEC) 40 MG capsule TAKE 1 CAPSULE BY MOUTH EVERY DAY   ONETOUCH DELICA LANCETS 99991111 MISC TEST BLOOD SUGAR BID   rOPINIRole (REQUIP XL) 2 MG 24 hr tablet Take 2 mg by mouth at bedtime.    rOPINIRole (REQUIP) 2 MG tablet TAKE ONE TABLET BY MOUTH AT BEDTIME FOR RESTLESS LEG   sertraline (ZOLOFT) 100 MG tablet Take 1 tablet (100 mg total) by mouth daily.   simvastatin (ZOCOR) 40 MG tablet Take one tab po qhs for cholesterol   levothyroxine (SYNTHROID) 125 MCG tablet TAKE ONE AND A HALF TABLETS BY MOUTH EVERY MORNING (Patient not taking: Reported on 09/09/2020)   sertraline (ZOLOFT) 50 MG tablet TAKE ONE AND A HALF TABLETS BY MOUTH EVERY DAY (Patient not taking: Reported on 09/09/2020)   No facility-administered encounter medications on file as of 09/09/2020.    Surgical History: Past Surgical History:  Procedure Laterality Date   ABDOMINAL HYSTERECTOMY     CATARACT EXTRACTION Bilateral    CHOLECYSTECTOMY     COLONOSCOPY WITH PROPOFOL N/A 04/02/2015   Procedure: COLONOSCOPY WITH PROPOFOL;  Surgeon: Josefine Class, MD;  Location: [facility] ENDOSCOPY;  Service: Endoscopy;  Laterality: N/A;   ESOPHAGOGASTRODUODENOSCOPY (EGD) WITH  PROPOFOL N/A 04/02/2015   Procedure: ESOPHAGOGASTRODUODENOSCOPY (EGD) WITH PROPOFOL;  Surgeon: Josefine Class, MD;  Location: [facility] ENDOSCOPY;  Service: Endoscopy;  Laterality: N/A;   GALLBLADDER SURGERY     LAPAROSCOPIC HYSTERECTOMY     THYROIDECTOMY Left    TONSILLECTOMY     TONSILLECTOMY Bilateral     Medical History: Past Medical History:  Diagnosis Date   Anxiety    Diabetes ([place])    Difficult intravenous access    GERD (gastroesophageal reflux disease)    HBP (high blood pressure)    Hyperlipidemia    Osteoarthritis    Parkinson  disease ([place])    Sleep apnea    Thyroid disease     Family History: Family History  Problem Relation Age of Onset   Asthma Father    Cancer Sister    Stroke Maternal Uncle    Heart disease Maternal Uncle    Diabetes Maternal Uncle     Social History   Socioeconomic History   Marital status: Widowed    Spouse name: Not on file   Number of children: Not on file   Years of education: Not on file   Highest education level: Not on file  Occupational History   Not on file  Tobacco Use   Smoking status: Never   Smokeless tobacco: Never  Vaping Use   Vaping Use: Never used  Substance and Sexual Activity   Alcohol use: No   Drug use: No   Sexual activity: Not on file  Other Topics Concern   Not on file  Social History Narrative   Not on file   Social Determinants of Health   Financial Resource Strain: Not on file  Food Insecurity: Not on file  Transportation Needs: Not on file  Physical Activity: Not on file  Stress: Not on file  Social Connections: Not on file  Intimate Partner Violence: Not on file      Review of Systems  Constitutional:  Positive for fatigue. Negative for chills and unexpected weight change.  HENT:  Negative for congestion, postnasal drip, rhinorrhea, sneezing and sore throat.   Eyes:  Negative for redness.  Respiratory:  Negative for cough, chest tightness and shortness of breath.   Cardiovascular:  Negative for chest pain and palpitations.  Gastrointestinal:  Negative for abdominal pain, constipation, diarrhea, nausea and vomiting.  Genitourinary:  Negative for dysuria and frequency.  Musculoskeletal:  Positive for arthralgias and gait problem. Negative for back pain, joint swelling and neck pain.  Skin:  Negative for rash.  Neurological:  Positive for dizziness and weakness. Negative for tremors, syncope and numbness.  Hematological:  Negative for adenopathy. Does not bruise/bleed easily.  Psychiatric/Behavioral:  Negative for behavioral  problems (Depression), sleep disturbance and suicidal ideas. The patient is not nervous/anxious.    Vital Signs: BP 140/90   Pulse 70   Temp 98.3 F (36.8 C)   Resp 16   Ht '5\' 4"'$  (1.626 m)   Wt 174 lb (78.9 kg)   SpO2 95%   BMI 29.87 kg/m    Physical Exam Vitals and nursing note reviewed.  Constitutional:      General: She is not in acute distress.    Appearance: She is well-developed. She is not diaphoretic.     Comments: Pt initially did not appear well, but improved after snack and water  HENT:     Head: Normocephalic and atraumatic.     Mouth/Throat:     Pharynx: No oropharyngeal exudate.  Eyes:  Pupils: Pupils are equal, round, and reactive to light.  Neck:     Thyroid: No thyromegaly.     Vascular: No JVD.     Trachea: No tracheal deviation.  Cardiovascular:     Rate and Rhythm: Normal rate and regular rhythm.     Heart sounds: Normal heart sounds. No murmur heard.   No friction rub. No gallop.  Pulmonary:     Effort: Pulmonary effort is normal. No respiratory distress.     Breath sounds: No wheezing or rales.  Chest:     Chest wall: No tenderness.  Abdominal:     General: Bowel sounds are normal.     Palpations: Abdomen is soft.  Musculoskeletal:        General: Normal range of motion.     Cervical back: Normal range of motion and neck supple.  Lymphadenopathy:     Cervical: No cervical adenopathy.  Skin:    General: Skin is warm and dry.  Neurological:     Mental Status: She is alert and oriented to person, place, and time.     Cranial Nerves: No cranial nerve deficit.     Gait: Gait abnormal.     Comments: Walks with walker  Psychiatric:        Behavior: Behavior normal.        Thought Content: Thought content normal.        Judgment: Judgment normal.       Assessment/Plan: 1. Dizziness Pt reports hx of vertigo, however dizziness improved with eating and drinking water. Discussed the importance of staying well hydrated and eating at  regular intervals.  2. Type 2 diabetes mellitus with hyperglycemia, without long-term current use of insulin (HCC) - POCT Glucose (CBG) is 131. Encouraged to eat at regular intervals and monitor BG. - TSH + free T4 - Basic Metabolic Panel (BMET) - Urinalysis, Complete  3. Essential hypertension Stable without medication, continue to monitor  4. Acquired hypothyroidism Followed by Dr. Ronnald Collum with recent increase to 166mg of synthroid. Continue to monitor - TSH + free T4 - Basic Metabolic Panel (BMET) - Urinalysis, Complete  5. Weakness Will update labs - TSH + free T4 - Basic Metabolic Panel (BMET) - Urinalysis, Complete  6. Parkinson disease ([place] Continue sinemet as prescribed  7. Dysuria - TSH + free T4 - Basic Metabolic Panel (BMET) - Urinalysis, Complete   General Counseling: MNijayaverbalizes understanding of the findings of todays visit and agrees with plan of treatment. I have discussed any further diagnostic evaluation that may be needed or ordered today. We also reviewed her medications today. she has been encouraged to call the office with any questions or concerns that should arise related to todays visit.    Orders Placed This Encounter  Procedures   TSH + free T4   Basic Metabolic Panel (BMET)   Urinalysis, Complete   POCT Glucose (CBG)    Meds ordered this encounter  Medications   sertraline (ZOLOFT) 100 MG tablet    Sig: Take 1 tablet (100 mg total) by mouth daily.    Dispense:  30 tablet    Refill:  3   ciprofloxacin (CIPRO) 500 MG tablet    Sig: Take 1 tablet (500 mg total) by mouth 2 (two) times daily for 10 days.    Dispense:  20 tablet    Refill:  0    This patient was seen by LDrema Dallas PA-C in collaboration with Dr. FClayborn Bignessas a part of collaborative care  agreement.   Total time spent:35 Minutes Time spent includes review of chart, medications, test results, and follow up plan with the patient.      Dr Lavera Guise Internal medicine

## 2020-09-09 NOTE — Telephone Encounter (Signed)
Pt has app today

## 2020-09-09 NOTE — Telephone Encounter (Signed)
Do I still fill this since pt canceled appt today?

## 2020-09-09 NOTE — Telephone Encounter (Signed)
Pt is here

## 2020-09-09 NOTE — Telephone Encounter (Signed)
Spoke to pt and informed her that [facility] sent in Cipro 500 mg 1 tab twice a day for 10 days, also she increased the dose on Zoloft from 50 to 100 mg.  Pt advised that there are orders for labs to be done after 10 days along with a UA to recheck her urine after taking Cipro.

## 2020-09-21 ENCOUNTER — Telehealth: Payer: Self-pay | Admitting: Internal Medicine

## 2020-09-21 NOTE — Progress Notes (Signed)
LEFT VM TO R/S MISSED APT

## 2020-09-23 ENCOUNTER — Ambulatory Visit (INDEPENDENT_AMBULATORY_CARE_PROVIDER_SITE_OTHER): Payer: Medicare Other | Admitting: Podiatry

## 2020-09-23 ENCOUNTER — Encounter: Payer: Self-pay | Admitting: Podiatry

## 2020-09-23 ENCOUNTER — Other Ambulatory Visit: Payer: Self-pay

## 2020-09-23 DIAGNOSIS — M2042 Other hammer toe(s) (acquired), left foot: Secondary | ICD-10-CM

## 2020-09-23 DIAGNOSIS — M2010 Hallux valgus (acquired), unspecified foot: Secondary | ICD-10-CM | POA: Diagnosis not present

## 2020-09-23 DIAGNOSIS — F325 Major depressive disorder, single episode, in full remission: Secondary | ICD-10-CM | POA: Insufficient documentation

## 2020-09-23 DIAGNOSIS — M2041 Other hammer toe(s) (acquired), right foot: Secondary | ICD-10-CM | POA: Diagnosis not present

## 2020-09-23 DIAGNOSIS — E1142 Type 2 diabetes mellitus with diabetic polyneuropathy: Secondary | ICD-10-CM | POA: Diagnosis not present

## 2020-09-23 DIAGNOSIS — M79676 Pain in unspecified toe(s): Secondary | ICD-10-CM

## 2020-09-23 DIAGNOSIS — B351 Tinea unguium: Secondary | ICD-10-CM

## 2020-09-23 NOTE — Progress Notes (Signed)
This patient returns to my office for at risk foot care.  This patient requires this care by a professional since this patient will be at risk due to having type 2 diabetes.  This patient is unable to cut nails herself since the patient cannot reach her nails.These nails are painful walking and wearing shoes. Patient has not been seen in over a year. This patient presents for at risk foot care today.  General Appearance  Alert, conversant and in no acute stress.  Vascular  Dorsalis pedis and posterior tibial  pulses are palpable  bilaterally.  Capillary return is within normal limits  bilaterally. Temperature is within normal limits  bilaterally.  Neurologic  Senn-Weinstein monofilament wire test within normal limits  bilaterally. Muscle power within normal limits bilaterally.  Nails Thick disfigured discolored nails with subungual debris  from hallux to fifth toes bilaterally. No evidence of bacterial infection or drainage bilaterally.  Orthopedic  No limitations of motion  feet .  No crepitus or effusions noted.  No bony pathology or digital deformities noted.  HAV  B/L.  Contracted digits 2-5  B/L.  Skin  normotropic skin with no porokeratosis noted bilaterally.  No signs of infections or ulcers noted.  Pinch callus right big toe.  Onychomycosis  Pain in right toes  Pain in left toes  Consent was obtained for treatment procedures.   Mechanical debridement of nails 1-5  bilaterally performed with a nail nipper.  Filed with dremel without incident.    Return office visit   12 weeks                  Told patient to return for periodic foot care and evaluation due to potential at risk complications.   Gardiner Barefoot DPM

## 2020-10-04 ENCOUNTER — Other Ambulatory Visit: Payer: Self-pay | Admitting: Nurse Practitioner

## 2020-10-04 DIAGNOSIS — R112 Nausea with vomiting, unspecified: Secondary | ICD-10-CM

## 2020-10-05 ENCOUNTER — Other Ambulatory Visit: Payer: Self-pay

## 2020-10-05 ENCOUNTER — Other Ambulatory Visit: Payer: Self-pay | Admitting: Internal Medicine

## 2020-10-05 ENCOUNTER — Emergency Department: Payer: Medicare Other

## 2020-10-05 ENCOUNTER — Encounter: Payer: Self-pay | Admitting: Emergency Medicine

## 2020-10-05 ENCOUNTER — Emergency Department
Admission: EM | Admit: 2020-10-05 | Discharge: 2020-10-05 | Disposition: A | Discharge status: Home / Self Care | Payer: Medicare Other | Attending: Emergency Medicine | Admitting: Emergency Medicine

## 2020-10-05 DIAGNOSIS — M545 Low back pain, unspecified: Secondary | ICD-10-CM | POA: Diagnosis not present

## 2020-10-05 DIAGNOSIS — M79605 Pain in left leg: Secondary | ICD-10-CM | POA: Insufficient documentation

## 2020-10-05 DIAGNOSIS — M79604 Pain in right leg: Secondary | ICD-10-CM | POA: Insufficient documentation

## 2020-10-05 DIAGNOSIS — M7918 Myalgia, other site: Secondary | ICD-10-CM

## 2020-10-05 DIAGNOSIS — E119 Type 2 diabetes mellitus without complications: Secondary | ICD-10-CM | POA: Insufficient documentation

## 2020-10-05 DIAGNOSIS — M25551 Pain in right hip: Secondary | ICD-10-CM | POA: Diagnosis present

## 2020-10-05 DIAGNOSIS — G3183 Dementia with Lewy bodies: Secondary | ICD-10-CM | POA: Insufficient documentation

## 2020-10-05 DIAGNOSIS — M542 Cervicalgia: Secondary | ICD-10-CM | POA: Diagnosis not present

## 2020-10-05 DIAGNOSIS — Z79899 Other long term (current) drug therapy: Secondary | ICD-10-CM | POA: Diagnosis not present

## 2020-10-05 DIAGNOSIS — M1990 Unspecified osteoarthritis, unspecified site: Secondary | ICD-10-CM

## 2020-10-05 DIAGNOSIS — E039 Hypothyroidism, unspecified: Secondary | ICD-10-CM | POA: Insufficient documentation

## 2020-10-05 DIAGNOSIS — I10 Essential (primary) hypertension: Secondary | ICD-10-CM | POA: Diagnosis not present

## 2020-10-05 DIAGNOSIS — M13851 Other specified arthritis, right hip: Secondary | ICD-10-CM | POA: Insufficient documentation

## 2020-10-05 DIAGNOSIS — Z7984 Long term (current) use of oral hypoglycemic drugs: Secondary | ICD-10-CM | POA: Insufficient documentation

## 2020-10-05 LAB — URINALYSIS, COMPLETE (UACMP) WITH MICROSCOPIC
Bilirubin Urine: NEGATIVE
Glucose, UA: NEGATIVE mg/dL
Hgb urine dipstick: NEGATIVE
Ketones, ur: 5 mg/dL — AB
Leukocytes,Ua: NEGATIVE
Nitrite: NEGATIVE
Protein, ur: NEGATIVE mg/dL
Specific Gravity, Urine: 1.023 (ref 1.005–1.030)
pH: 7 (ref 5.0–8.0)

## 2020-10-05 MED ORDER — LIDOCAINE 5 % EX PTCH
1.0000 | MEDICATED_PATCH | Freq: Once | CUTANEOUS | Status: DC
  Administered 2020-10-05: 1 via TRANSDERMAL
  Filled 2020-10-05: qty 1

## 2020-10-05 MED ORDER — KETOROLAC TROMETHAMINE 60 MG/2ML IM SOLN
30.0000 mg | Freq: Once | INTRAMUSCULAR | Status: AC
  Administered 2020-10-05: 30 mg via INTRAMUSCULAR
  Filled 2020-10-05: qty 2

## 2020-10-05 MED ORDER — ONDANSETRON 4 MG PO TBDP
4.0000 mg | ORAL_TABLET | Freq: Once | ORAL | Status: AC
  Administered 2020-10-05: 4 mg via ORAL
  Filled 2020-10-05: qty 1

## 2020-10-05 MED ORDER — LIDOCAINE 4 % EX PTCH: 1.0000 | MEDICATED_PATCH | Freq: Every day | CUTANEOUS | 0 refills | Status: AC

## 2020-10-05 MED ORDER — BACLOFEN 10 MG PO TABS
10.0000 mg | ORAL_TABLET | Freq: Once | ORAL | Status: AC
  Administered 2020-10-05: 10 mg via ORAL
  Filled 2020-10-05: qty 1

## 2020-10-05 MED ORDER — OXYCODONE HCL 5 MG PO TABS
5.0000 mg | ORAL_TABLET | Freq: Once | ORAL | Status: DC
Start: 2020-10-05 — End: 2020-10-05
  Filled 2020-10-05: qty 1

## 2020-10-05 MED ORDER — ACETAMINOPHEN 500 MG PO TABS
1000.0000 mg | ORAL_TABLET | Freq: Once | ORAL | Status: AC
  Administered 2020-10-05: 1000 mg via ORAL
  Filled 2020-10-05: qty 2

## 2020-10-05 NOTE — ED Provider Notes (Signed)
[facility] Emergency Department Provider Note ____________________________________________  Time seen: Approximately 8:26 AM  I have reviewed the triage vital signs and the nursing notes.   HISTORY  Chief Complaint Back Pain    HPI Theresa Mills is a 70 y.o. female who presents to the emergency department for evaluation and treatment of diffuse pain from her neck to bilateral legs.  Patient states that she has extensive arthritis and when the weather changes her pain gets much worse.  She is having trouble walking and is unable to get in and out of the vehicle.  She takes Tylenol arthritis twice per day which does not seem like it is helping her much.  Pain seems to be gradually worsening over the past several days.  She has not been able to get in with her primary care provider.  Past Medical History:  Diagnosis Date   Anxiety    Diabetes ([place])    Difficult intravenous access    GERD (gastroesophageal reflux disease)    HBP (high blood pressure)    Hyperlipidemia    Osteoarthritis    Parkinson disease ([place])    Sleep apnea    Thyroid disease     Patient Active Problem List   Diagnosis Date Noted   Major depression in remission ([place]) 09/23/2020   Hav (hallux abducto valgus), unspecified laterality 09/23/2020   Back pain 09/30/2019   Epidural abscess 09/30/2019   Psoas abscess, right ([place]) 09/30/2019   High anion gap metabolic acidosis 99991111   Lumbar compression fracture ([place]) 08/25/2019   Osteomyelitis of lumbar vertebra ([place]) 08/25/2019   Discitis 08/25/2019   Malnutrition of moderate degree 08/20/2019   Severe sepsis ([place]) 08/19/2019   Bilateral hydronephrosis 08/19/2019   Acute pyelonephritis [id number]   Acute metabolic encephalopathy [id number]   Acute urinary retention 08/19/2019   AKI (acute kidney injury) ([place]) 08/19/2019   Osteoarthritis of multiple joints    Frequent falls 08/05/2019   Urinary tract infection with hematuria  06/11/2019   Nausea and vomiting 02/13/2019   Acquired hypothyroidism 02/13/2019   Acquired bilateral hammer toes 01/23/2019   Type 2 diabetes mellitus with hyperglycemia ([place]) 09/15/2018   Encounter for general adult medical examination with abnormal findings 09/05/2017   Dysuria 09/05/2017   Uncontrolled type 2 diabetes mellitus with hyperglycemia ([place]) 06/18/2017   Rib fractures 06/11/2017   Pressure injury of coccygeal region, stage 2 ([place]) 06/11/2017   Rhabdomyolysis 06/10/2017   Elevated troponin 06/10/2017   Hypernatremia 06/10/2017   Fall 06/10/2017   Arthritis 01/08/2017   Cataracts, bilateral 01/08/2017   Chickenpox 01/08/2017   Shingles 01/08/2017   Depression 01/08/2017   Diabetes mellitus type 2, uncomplicated ([place]) 0000000   Hyperlipidemia, unspecified 01/08/2017   Essential hypertension 01/08/2017   Migraine headache 01/08/2017   Thyroid disease 01/08/2017   Parkinson disease ([place]) 01/08/2017   Anxiety, generalized 09/01/2016   Dementia due to Parkinson's disease without behavioral disturbance ([place]) 09/01/2016   History of depression 09/01/2016   Morbid obesity with BMI of 40.0-44.9, adult ([place]) 09/01/2016   Restless leg syndrome 09/01/2016   Class 2 severe obesity with serious comorbidity in adult ([place]) 09/01/2016    Past Surgical History:  Procedure Laterality Date   ABDOMINAL HYSTERECTOMY     CATARACT EXTRACTION Bilateral    CHOLECYSTECTOMY     COLONOSCOPY WITH PROPOFOL N/A 04/02/2015   Procedure: COLONOSCOPY WITH PROPOFOL;  Surgeon: Josefine Class, MD;  Location: [facility] ENDOSCOPY;  Service: Endoscopy;  Laterality: N/A;   ESOPHAGOGASTRODUODENOSCOPY (EGD) WITH  PROPOFOL N/A 04/02/2015   Procedure: ESOPHAGOGASTRODUODENOSCOPY (EGD) WITH PROPOFOL;  Surgeon: Josefine Class, MD;  Location: [facility] ENDOSCOPY;  Service: Endoscopy;  Laterality: N/A;   GALLBLADDER SURGERY     LAPAROSCOPIC HYSTERECTOMY     THYROIDECTOMY Left    TONSILLECTOMY     TONSILLECTOMY  Bilateral     Prior to Admission medications   Medication Sig Start Date End Date Taking? Authorizing Provider  Lidocaine 4 % PTCH Apply 1 patch topically daily. 10/05/20  Yes Mansur Patti, Dessa Phi, FNP  ALPRAZolam Duanne Moron) 0.5 MG tablet  07/28/19   [provider]  Calcium Carbonate-Vitamin D 600-200 MG-UNIT TABS Take 1 tablet by mouth daily.     [provider]  carbidopa-levodopa (SINEMET CR) 50-200 MG tablet Take 1 tablet by mouth 2 (two) times daily. 08/12/20   Lavera Guise, MD  carbidopa-levodopa (SINEMET IR) 25-100 MG tablet Take 2 tablets by mouth 4 (four) times daily. 08/12/20   Lavera Guise, MD  celecoxib (CELEBREX) 200 MG capsule TAKE 1 CAPSULE BY MOUTH TWICE DAILY AS NEEDED 07/29/20   Lavera Guise, MD  citalopram (CELEXA) 20 MG tablet  11/10/19   [provider]  clotrimazole-betamethasone (LOTRISONE) cream Apply 1 application topically 2 (two) times daily. 07/08/20   Jonetta Osgood, NP  docusate sodium (COLACE) 100 MG capsule Take 1 capsule (100 mg total) by mouth 2 (two) times daily. 08/09/19   Fritzi Mandes, MD  glucose blood test strip TEST BLOOD SUGAR BID 01/27/16   [provider]  levothyroxine (SYNTHROID) 100 MCG tablet Take 100 mcg by mouth daily before breakfast. Was changed 09/09/20 by Dr Ronnald Collum.  Was taking 88 mcg    [provider]  Melatonin 3 MG TABS Take 3 mg by mouth at bedtime as needed (sleep).     [provider]  metFORMIN (GLUCOPHAGE) 500 MG tablet TAKE 1 TABLET BY MOUTH TWICE DAILY 08/18/20   Lavera Guise, MD  mirtazapine (REMERON) 15 MG tablet Take 7.5 mg by mouth daily. 03/30/20   [provider]  Multiple Vitamins-Minerals (MULTIVITAMIN WOMEN 50+ PO) Take 1 tablet by mouth daily.     [provider]  omeprazole (PRILOSEC) 40 MG capsule TAKE 1 CAPSULE BY MOUTH EVERY DAY 06/17/20   Lavera Guise, MD  [facility] DELICA LANCETS 99991111 MISC TEST BLOOD SUGAR BID 01/27/16   [provider]  rOPINIRole  (REQUIP XL) 2 MG 24 hr tablet Take 2 mg by mouth at bedtime.     [provider]  rOPINIRole (REQUIP) 2 MG tablet TAKE ONE TABLET BY MOUTH AT BEDTIME FOR RESTLESS LEG 07/25/20   Lavera Guise, MD  sertraline (ZOLOFT) 100 MG tablet Take 1 tablet (100 mg total) by mouth daily. 09/09/20   Lavera Guise, MD  sertraline (ZOLOFT) 50 MG tablet TAKE ONE AND A HALF TABLETS BY MOUTH EVERY DAY Patient not taking: Reported on 09/09/2020 06/02/20   Lavera Guise, MD  simvastatin (ZOCOR) 40 MG tablet Take one tab po qhs for cholesterol 07/06/20   Lavera Guise, MD  SYNTHROID 88 MCG tablet Take 88 mcg by mouth every morning. 08/18/20   [provider]    Allergies Codeine, Ephadrene [cholestatin], Other, Valium [diazepam], and Ephedrine  Family History  Problem Relation Age of Onset   Asthma Father    Cancer Sister    Stroke Maternal Uncle    Heart disease Maternal Uncle    Diabetes Maternal Uncle     Social History Social  History   Tobacco Use   Smoking status: Never   Smokeless tobacco: Never  Vaping Use   Vaping Use: Never used  Substance Use Topics   Alcohol use: No   Drug use: No    Review of Systems Constitutional: Negative for fever. Cardiovascular: Negative for chest pain. Respiratory: Negative for shortness of breath. Musculoskeletal: Positive for back, right hip, and bilateral leg pain. Skin: No open wounds.  Neurological: Negative for decrease in sensation  ____________________________________________   PHYSICAL EXAM:  VITAL SIGNS: ED Triage Vitals [10/05/20 0751]  Enc Vitals Group     BP (!) 145/63     Pulse Rate 67     Resp 16     Temp 98.5 F (36.9 C)     Temp Source Oral     SpO2 94 %     Weight 172 lb (78 kg)     Height '5\' 4"'$  (1.626 m)     Head Circumference      Peak Flow      Pain Score 7     Pain Loc      Pain Edu?      Excl. in [place]?     Constitutional: Alert and oriented. Well appearing and in no acute distress. Eyes: Conjunctivae are  clear without discharge or drainage Head: Atraumatic Neck: Supple Respiratory: No cough. Respirations are even and unlabored. Musculoskeletal: No deformities. Able to demonstrate active ROM of extremities. Neurologic: Awake, alert, oriented.   Skin: No open wounds/lesions.  Psychiatric: Affect and behavior are appropriate.  ____________________________________________   LABS (all labs ordered are listed, but only abnormal results are displayed)  Labs Reviewed  URINALYSIS, COMPLETE (UACMP) WITH MICROSCOPIC - Abnormal; Notable for the following components:      Result Value   Color, Urine YELLOW (*)    APPearance CLEAR (*)    Ketones, ur 5 (*)    Bacteria, UA RARE (*)    All other components within normal limits   ____________________________________________  RADIOLOGY  Not indicated.  I, Sherrie George, personally viewed and evaluated these images (plain radiographs) as part of my medical decision making, as well as reviewing the written report by the radiologist.  DG Hip Unilat W or Wo Pelvis 2-3 Views Right  Result Date: 10/05/2020 CLINICAL DATA:  Right hip pain. EXAM: DG HIP (WITH OR WITHOUT PELVIS) 2-3V RIGHT COMPARISON:  None. FINDINGS: There is no evidence of hip fracture or dislocation. Severe narrowing of the right hip joint is noted with osteophyte formation. IMPRESSION: Severe osteoarthritis of right hip.  No acute abnormality seen. Electronically Signed   By: Marijo Conception M.D.   On: 10/05/2020 13:36   ____________________________________________   PROCEDURES  Procedures  ____________________________________________   INITIAL IMPRESSION / ASSESSMENT AND PLAN / ED COURSE  Theresa Mills is a 70 y.o. who presents to the emergency department for treatment and evaluation of acute on chronic pain.  See HPI for further details.  Patient is not currently on any anticoagulants.  We will give her Toradol.  Discussed goal of treatment today. Patient does not want to  go to SNF. She would like assistance with pain management and to return home.  Pain slightly improved after Toradol. Nausea improved after zofran. Now complaining of muscle spasms in legs. Baclofen ordered.  Patient very reluctant to attempt to sit on the bed or stand due to pain in the lower back and right hip. At home, she goes from her bed to recliner that is right  beside her bed and only gets up if she needs to use the restroom. She has people who come in to help with ADL and food.  After lidocaine and tylenol, she is able to sit up and stand with a walker. She was able to take a few steps and reports this is her baseline mobility. She will be discharged home.    Medications  lidocaine (LIDODERM) 5 % 1 patch (1 patch Transdermal Patch Applied 10/05/20 1424)  ketorolac (TORADOL) injection 30 mg (30 mg Intramuscular Given 10/05/20 0836)  ondansetron (ZOFRAN-ODT) disintegrating tablet 4 mg (4 mg Oral Given 10/05/20 0951)  baclofen (LIORESAL) tablet 10 mg (10 mg Oral Given 10/05/20 1129)  acetaminophen (TYLENOL) tablet 1,000 mg (1,000 mg Oral Given 10/05/20 1424)    Pertinent labs & imaging results that were available during my care of the patient were reviewed by me and considered in my medical decision making (see chart for details).   _________________________________________   FINAL CLINICAL IMPRESSION(S) / ED DIAGNOSES  Final diagnoses:  Arthritis  Musculoskeletal pain    ED Discharge Orders          Ordered    Lidocaine 4 % PTCH  Daily        10/05/20 1542             If controlled substance prescribed during this visit, 12 month history viewed on the [place] prior to issuing an initial prescription for Schedule II or III opiod.    Victorino Dike, FNP 10/05/20 1550    Vladimir Crofts, MD 10/05/20 1610

## 2020-10-05 NOTE — Discharge Instructions (Signed)
Please follow up with your primary care provider as soon as possible.  Return to the ER for symptoms of concern.

## 2020-10-05 NOTE — ED Triage Notes (Signed)
Presents via EMS from home  states she has a hx of arthritis   having increased back and leg pain this am

## 2020-10-07 ENCOUNTER — Other Ambulatory Visit: Payer: Self-pay | Admitting: Internal Medicine

## 2020-10-08 ENCOUNTER — Emergency Department: Payer: Medicare Other

## 2020-10-08 ENCOUNTER — Emergency Department
Admission: EM | Admit: 2020-10-08 | Discharge: 2020-10-08 | Disposition: A | Discharge status: Home / Self Care | Payer: Medicare Other | Source: Home / Self Care | Attending: Emergency Medicine | Admitting: Emergency Medicine

## 2020-10-08 ENCOUNTER — Encounter: Payer: Self-pay | Admitting: Emergency Medicine

## 2020-10-08 ENCOUNTER — Other Ambulatory Visit: Payer: Self-pay

## 2020-10-08 DIAGNOSIS — Z7984 Long term (current) use of oral hypoglycemic drugs: Secondary | ICD-10-CM | POA: Insufficient documentation

## 2020-10-08 DIAGNOSIS — G20 Parkinson's disease: Secondary | ICD-10-CM | POA: Insufficient documentation

## 2020-10-08 DIAGNOSIS — I10 Essential (primary) hypertension: Secondary | ICD-10-CM | POA: Insufficient documentation

## 2020-10-08 DIAGNOSIS — M4856XA Collapsed vertebra, not elsewhere classified, lumbar region, initial encounter for fracture: Secondary | ICD-10-CM | POA: Diagnosis not present

## 2020-10-08 DIAGNOSIS — R531 Weakness: Secondary | ICD-10-CM | POA: Insufficient documentation

## 2020-10-08 DIAGNOSIS — E119 Type 2 diabetes mellitus without complications: Secondary | ICD-10-CM | POA: Insufficient documentation

## 2020-10-08 DIAGNOSIS — E039 Hypothyroidism, unspecified: Secondary | ICD-10-CM | POA: Insufficient documentation

## 2020-10-08 DIAGNOSIS — F0390 Unspecified dementia without behavioral disturbance: Secondary | ICD-10-CM | POA: Insufficient documentation

## 2020-10-08 DIAGNOSIS — M545 Low back pain, unspecified: Secondary | ICD-10-CM | POA: Insufficient documentation

## 2020-10-08 DIAGNOSIS — M549 Dorsalgia, unspecified: Secondary | ICD-10-CM | POA: Diagnosis not present

## 2020-10-08 DIAGNOSIS — Z79899 Other long term (current) drug therapy: Secondary | ICD-10-CM | POA: Insufficient documentation

## 2020-10-08 LAB — CBC WITH DIFFERENTIAL/PLATELET
Abs Immature Granulocytes: 0.03 10*3/uL (ref 0.00–0.07)
Basophils Absolute: 0 10*3/uL (ref 0.0–0.1)
Basophils Relative: 0 %
Eosinophils Absolute: 0.1 10*3/uL (ref 0.0–0.5)
Eosinophils Relative: 1 %
HCT: 44.7 % (ref 36.0–46.0)
Hemoglobin: 14.7 g/dL (ref 12.0–15.0)
Immature Granulocytes: 0 %
Lymphocytes Relative: 27 %
Lymphs Abs: 2.1 10*3/uL (ref 0.7–4.0)
MCH: 32.2 pg (ref 26.0–34.0)
MCHC: 32.9 g/dL (ref 30.0–36.0)
MCV: 98 fL (ref 80.0–100.0)
Monocytes Absolute: 0.5 10*3/uL (ref 0.1–1.0)
Monocytes Relative: 7 %
Neutro Abs: 5 10*3/uL (ref 1.7–7.7)
Neutrophils Relative %: 65 %
Platelets: 194 10*3/uL (ref 150–400)
RBC: 4.56 MIL/uL (ref 3.87–5.11)
RDW: 12.6 % (ref 11.5–15.5)
WBC: 7.9 10*3/uL (ref 4.0–10.5)
nRBC: 0 % (ref 0.0–0.2)

## 2020-10-08 LAB — BASIC METABOLIC PANEL
Anion gap: 11 (ref 5–15)
BUN: 24 mg/dL — ABNORMAL HIGH (ref 8–23)
CO2: 24 mmol/L (ref 22–32)
Calcium: 9.4 mg/dL (ref 8.9–10.3)
Chloride: 105 mmol/L (ref 98–111)
Creatinine, Ser: 0.51 mg/dL (ref 0.44–1.00)
GFR, Estimated: >60 mL/min (ref >60)
Glucose, Bld: 88 mg/dL (ref 70–99)
Potassium: 3.6 mmol/L (ref 3.5–5.1)
Sodium: 140 mmol/L (ref 135–145)

## 2020-10-08 LAB — URINALYSIS, COMPLETE (UACMP) WITH MICROSCOPIC
Bilirubin Urine: NEGATIVE
Glucose, UA: NEGATIVE mg/dL
Hgb urine dipstick: NEGATIVE
Ketones, ur: 5 mg/dL — AB
Nitrite: NEGATIVE
Protein, ur: 30 mg/dL — AB
Specific Gravity, Urine: 1.029 (ref 1.005–1.030)
pH: 6 (ref 5.0–8.0)

## 2020-10-08 LAB — SEDIMENTATION RATE: Sed Rate: 7 mm/hr (ref 0–30)

## 2020-10-08 MED ORDER — LIDOCAINE 5 % EX PTCH
1.0000 | MEDICATED_PATCH | CUTANEOUS | Status: DC
  Administered 2020-10-08: 1 via TRANSDERMAL
  Filled 2020-10-08: qty 1

## 2020-10-08 MED ORDER — OXYCODONE HCL 5 MG PO TABS
2.5000 mg | ORAL_TABLET | ORAL | Status: AC
  Administered 2020-10-08: 2.5 mg via ORAL
  Filled 2020-10-08: qty 1

## 2020-10-08 MED ORDER — NAPROXEN 500 MG PO TABS
500.0000 mg | ORAL_TABLET | Freq: Once | ORAL | Status: AC
  Administered 2020-10-08: 500 mg via ORAL
  Filled 2020-10-08: qty 1

## 2020-10-08 NOTE — ED Notes (Signed)
Phlebotomy at bedside.

## 2020-10-08 NOTE — ED Notes (Signed)
Pt assisted onto bedpan.

## 2020-10-08 NOTE — ED Provider Notes (Signed)
[facility] Emergency Department Provider Note  ____________________________________________   Event Date/Time   First MD Initiated Contact with Patient 10/08/20 1108     (approximate)  I have reviewed the triage vital signs and the nursing notes.   HISTORY  Chief Complaint Back Pain   HPI Theresa Mills is a 70 y.o. female with a past medical history of anxiety, DM, GERD, HTN, HDL, osteoarthritis and Parkinson's disease as well as OSA and recent ED evaluation on 9/13 for some acute right low back pain that patient states began when she twisted in bed.  Patient states she was told she had some arthritis from an x-ray of the hip and that she may have sprained a muscle.  She states it is still hurting.  She has not had any other injuries or falls..  Patient states she was told she had some arthritis from an x-ray of the hip and that she may have sprained a muscle.  She states it is still hurting.  She has not had any other injuries or falls.  She denies any headache, earache, sore throat, chest pain, cough, shortness of breath, Don[place] pain, pain with urination, incontinence or any new lower extremity weakness numbness or tingling.  She does note she has a chronic weakness in her lower extremities and typically uses a walker or wheelchair to get around at home.         Past Medical History:  Diagnosis Date   Anxiety    Diabetes ([place])    Difficult intravenous access    GERD (gastroesophageal reflux disease)    HBP (high blood pressure)    Hyperlipidemia    Osteoarthritis    Parkinson disease ([place])    Sleep apnea    Thyroid disease     Patient Active Problem List   Diagnosis Date Noted   Major depression in remission ([place]) 09/23/2020   Hav (hallux abducto valgus), unspecified laterality 09/23/2020   Back pain 09/30/2019   Epidural abscess 09/30/2019   Psoas abscess, right ([place]) 09/30/2019   High anion gap metabolic acidosis 78/29/5621   Lumbar  compression fracture ([place]) 08/25/2019   Osteomyelitis of lumbar vertebra ([place]) 08/25/2019   Discitis 08/25/2019   Malnutrition of moderate degree 08/20/2019   Severe sepsis ([place]) 08/19/2019   Bilateral hydronephrosis 08/19/2019   Acute pyelonephritis 30/86/5784   Acute metabolic encephalopathy 69/62/9528   Acute urinary retention 08/19/2019   AKI (acute kidney injury) ([place]) 08/19/2019   Osteoarthritis of multiple joints    Frequent falls 08/05/2019   Urinary tract infection with hematuria 06/11/2019   Nausea and vomiting 02/13/2019   Acquired hypothyroidism 02/13/2019   Acquired bilateral hammer toes 01/23/2019   Type 2 diabetes mellitus with hyperglycemia ([place]) 09/15/2018   Encounter for general adult medical examination with abnormal findings 09/05/2017   Dysuria 09/05/2017   Uncontrolled type 2 diabetes mellitus with hyperglycemia ([place]) 06/18/2017   Rib fractures 06/11/2017   Pressure injury of coccygeal region, stage 2 ([place]) 06/11/2017   Rhabdomyolysis 06/10/2017   Elevated troponin 06/10/2017   Hypernatremia 06/10/2017   Fall 06/10/2017   Arthritis 01/08/2017   Cataracts, bilateral 01/08/2017   Chickenpox 01/08/2017   Shingles 01/08/2017   Depression 01/08/2017   Diabetes mellitus type 2, uncomplicated ([place]) 41/32/4401   Hyperlipidemia, unspecified 01/08/2017   Essential hypertension 01/08/2017   Migraine headache 01/08/2017   Thyroid disease 01/08/2017   Parkinson disease ([place]) 01/08/2017   Anxiety, generalized 09/01/2016   Dementia due to Parkinson's disease without behavioral  disturbance ([place]) 09/01/2016   History of depression 09/01/2016   Morbid obesity with BMI of 40.0-44.9, adult ([place]) 09/01/2016   Restless leg syndrome 09/01/2016   Class 2 severe obesity with serious comorbidity in adult [facility]) 09/01/2016    Past Surgical History:  Procedure Laterality Date   ABDOMINAL HYSTERECTOMY     CATARACT EXTRACTION Bilateral    CHOLECYSTECTOMY     COLONOSCOPY WITH  PROPOFOL N/A 04/02/2015   Procedure: COLONOSCOPY WITH PROPOFOL;  Surgeon: Josefine Class, MD;  Location: [facility] ENDOSCOPY;  Service: Endoscopy;  Laterality: N/A;   ESOPHAGOGASTRODUODENOSCOPY (EGD) WITH PROPOFOL N/A 04/02/2015   Procedure: ESOPHAGOGASTRODUODENOSCOPY (EGD) WITH PROPOFOL;  Surgeon: Josefine Class, MD;  Location: [facility] ENDOSCOPY;  Service: Endoscopy;  Laterality: N/A;   GALLBLADDER SURGERY     LAPAROSCOPIC HYSTERECTOMY     THYROIDECTOMY Left    TONSILLECTOMY     TONSILLECTOMY Bilateral     Prior to Admission medications   Medication Sig Start Date End Date Taking? Authorizing Provider  ALPRAZolam Duanne Moron) 0.5 MG tablet  07/28/19   [provider]  Calcium Carbonate-Vitamin D 600-200 MG-UNIT TABS Take 1 tablet by mouth daily.     [provider]  carbidopa-levodopa (SINEMET CR) 50-200 MG tablet Take 1 tablet by mouth 2 (two) times daily. 08/12/20   Lavera Guise, MD  carbidopa-levodopa (SINEMET IR) 25-100 MG tablet Take 2 tablets by mouth 4 (four) times daily. 08/12/20   Lavera Guise, MD  celecoxib (CELEBREX) 200 MG capsule TAKE 1 CAPSULE BY MOUTH TWICE DAILY AS NEEDED 07/29/20   Lavera Guise, MD  citalopram (CELEXA) 20 MG tablet  11/10/19   [provider]  clotrimazole-betamethasone (LOTRISONE) cream Apply 1 application topically 2 (two) times daily. 07/08/20   Jonetta Osgood, NP  docusate sodium (COLACE) 100 MG capsule Take 1 capsule (100 mg total) by mouth 2 (two) times daily. 08/09/19   Fritzi Mandes, MD  glucose blood test strip TEST BLOOD SUGAR BID 01/27/16   [provider]  levothyroxine (SYNTHROID) 100 MCG tablet Take 100 mcg by mouth daily before breakfast. Was changed 09/09/20 by Dr Ronnald Collum.  Was taking 88 mcg    [provider]  Lidocaine 4 % PTCH Apply 1 patch topically daily. 10/05/20   Triplett, Johnette Abraham B, FNP  Melatonin 3 MG TABS Take 3 mg by mouth at bedtime as needed (sleep).     [provider]  metFORMIN  (GLUCOPHAGE) 500 MG tablet TAKE 1 TABLET BY MOUTH TWICE DAILY 08/18/20   Lavera Guise, MD  mirtazapine (REMERON) 15 MG tablet Take 7.5 mg by mouth daily. 03/30/20   [provider]  Multiple Vitamins-Minerals (MULTIVITAMIN WOMEN 50+ PO) Take 1 tablet by mouth daily.     [provider]  omeprazole (PRILOSEC) 40 MG capsule TAKE 1 CAPSULE BY MOUTH EVERY DAY 06/17/20   Lavera Guise, MD  [facility] DELICA LANCETS 82N MISC TEST BLOOD SUGAR BID 01/27/16   [provider]  rOPINIRole (REQUIP XL) 2 MG 24 hr tablet Take 2 mg by mouth at bedtime.     [provider]  rOPINIRole (REQUIP) 2 MG tablet TAKE ONE TABLET BY MOUTH AT BEDTIME FOR RESTLESS LEG 07/25/20   Lavera Guise, MD  sertraline (ZOLOFT) 100 MG tablet Take 1 tablet (100 mg total) by mouth daily. 09/09/20   Lavera Guise, MD  sertraline (ZOLOFT) 50 MG tablet TAKE ONE AND A HALF TABLETS BY MOUTH EVERY DAY Patient not taking: Reported on 09/09/2020 06/02/20  Lavera Guise, MD  simvastatin (ZOCOR) 40 MG tablet Take one tab po qhs for cholesterol 07/06/20   Lavera Guise, MD  SYNTHROID 88 MCG tablet Take 88 mcg by mouth every morning. 08/18/20   [provider]    Allergies Codeine, Ephadrene [cholestatin], Other, Valium [diazepam], and Ephedrine  Family History  Problem Relation Age of Onset   Asthma Father    Cancer Sister    Stroke Maternal Uncle    Heart disease Maternal Uncle    Diabetes Maternal Uncle     Social History Social History   Tobacco Use   Smoking status: Never   Smokeless tobacco: Never  Vaping Use   Vaping Use: Never used  Substance Use Topics   Alcohol use: No   Drug use: No    Review of Systems  Review of Systems  Constitutional:  Negative for chills and fever.  HENT:  Negative for sore throat.   Eyes:  Negative for pain.  Respiratory:  Negative for cough and stridor.   Cardiovascular:  Negative for chest pain.  Gastrointestinal:  Negative for vomiting.   Musculoskeletal:  Positive for back pain.  Skin:  Negative for rash.  Neurological:  Negative for seizures, loss of consciousness and headaches.  Psychiatric/Behavioral:  Negative for suicidal ideas.   All other systems reviewed and are negative.    ____________________________________________   PHYSICAL EXAM:  VITAL SIGNS: ED Triage Vitals  Enc Vitals Group     BP 10/08/20 0959 121/71     Pulse Rate 10/08/20 0959 78     Resp 10/08/20 0959 16     Temp 10/08/20 0959 98.5 F (36.9 C)     Temp Source 10/08/20 0959 Oral     SpO2 10/08/20 0959 96 %     Weight 10/08/20 0956 171 lb 15.3 oz (78 kg)     Height 10/08/20 0956 _0  (1.626 m)     Head Circumference --      Peak Flow --      Pain Score 10/08/20 0955 9     Pain Loc --      Pain Edu? --      Excl. in [place]? --    Vitals:   10/08/20 0959  BP: 121/71  Pulse: 78  Resp: 16  Temp: 98.5 F (36.9 C)  SpO2: 96%   Physical Exam Vitals and nursing note reviewed.  Constitutional:      General: She is not in acute distress.    Appearance: She is well-developed.  HENT:     Head: Normocephalic and atraumatic.     Right Ear: External ear normal.     Left Ear: External ear normal.     Nose: Nose normal.  Eyes:     Conjunctiva/sclera: Conjunctivae normal.  Cardiovascular:     Rate and Rhythm: Normal rate and regular rhythm.     Pulses: Normal pulses.     Heart sounds: No murmur heard. Pulmonary:     Effort: Pulmonary effort is normal. No respiratory distress.     Breath sounds: Normal breath sounds.  Abdominal:     Palpations: Abdomen is soft.     Tenderness: There is no abdominal tenderness.  Musculoskeletal:     Cervical back: Neck supple.  Skin:    General: Skin is warm and dry.     Capillary Refill: Capillary refill takes less than 2 seconds.  Neurological:     Mental Status: She is alert and oriented to person, place, and time.  Psychiatric:        Mood and Affect: Mood normal.    No tenderness over the  C/T spine.  There is some tenderness over the L-spine.  Patient is able to range both hips passively and actively symmetric strength and without significant pain.  Right lower extremity is held externally rotated which she states is chronic.  Sensation is intact light touch throughout both extremities. ____________________________________________   LABS (all labs ordered are listed, but only abnormal results are displayed)  Labs Reviewed  BASIC METABOLIC PANEL - Abnormal; Notable for the following components:      Result Value   BUN 24 (*)    All other components within normal limits  URINALYSIS, COMPLETE (UACMP) WITH MICROSCOPIC - Abnormal; Notable for the following components:   Color, Urine AMBER (*)    APPearance CLOUDY (*)    Ketones, ur 5 (*)    Protein, ur 30 (*)    Leukocytes,Ua TRACE (*)    Bacteria, UA RARE (*)    All other components within normal limits  URINE CULTURE  CBC WITH DIFFERENTIAL/PLATELET  SEDIMENTATION RATE   ____________________________________________  EKG  ____________________________________________  RADIOLOGY  ED MD interpretation: Plain film of the L-spine shows scoliosis and degenerative changes but no clear acute fracture or dislocation.  CT of the L-spine shows no acute fracture but does show moderate scoliosis.  There is also extensive degenerative changes noted.  There is also evidence of chronic osteomyelitis changes from previous infection without evidence of clear acute infection.  Official radiology report(s): DG Lumbar Spine Complete  Result Date: 10/08/2020 CLINICAL DATA:  Low back pain after twisting injury EXAM: LUMBAR SPINE - COMPLETE 4+ VIEW COMPARISON:  09/30/2019 MRI FINDINGS: Moderate convex left lumbar spine curvature. Sacroiliac joints are symmetric. Osteopenia. Secondary to spinal curvature, the lateral view is limited for evaluation of vertebral body height loss. L2 through L4 vertebral body height loss is grossly similar.  Previous intervertebral disc irregularity at L3-4 was consistent with discitis/osteomyelitis on prior MRI. There is also intervertebral disc irregularity at L2-3, new since 09/02/2019 CT. Marked facet arthropathy. L5-S1 malalignment is grossly similar. IMPRESSION: Limited radiographs, secondary to the extent of spinal curvature and underlying chronic vertebral body height loss and disc irregularity. Grossly similar appearance of vertebral body height loss at L2-4 with intervertebral disc irregularity. Persistent L5-S1 malalignment. Given above limitations, and the patient's complex clinical history (see report of lumbar spine of 09/30/2019), repeat CT or MRI should be considered. Electronically Signed   By: Abigail Miyamoto M.D.   On: 10/08/2020 12:30   CT Lumbar Spine Wo Contrast  Result Date: 10/08/2020 CLINICAL DATA:  Low back pain. Trauma. History of L3-4 discitis and osteomyelitis. EXAM: CT LUMBAR SPINE WITHOUT CONTRAST TECHNIQUE: Multidetector CT imaging of the lumbar spine was performed without intravenous contrast administration. Multiplanar CT image reconstructions were also generated. COMPARISON:  CT lumbar spine 06/10/2017. MRI lumbar spine 09/30/2019 FINDINGS: Segmentation: Normal Alignment: Moderate levoscoliosis. Mild retrolisthesis L1-2, and L2-3. 9 mm retrolisthesis L3-4. 13 mm anterolisthesis L5-S1. Vertebrae: Moderately severe compression fracture of L4 vertebral body, right greater than left. This was not present in 2019 but is similar to the MRI 09/30/2019. There is evidence of chronic osteomyelitis at L3-4 with chronic erosive changes in the endplates which are now sclerotic. No other fracture. Paraspinal and other soft tissues: Negative for paraspinous mass, edema, or fluid collection. Disc levels: L1-2: Disc and facet degeneration with spurring. No significant stenosis L2-3: Moderate disc degeneration and spurring right greater  than left. Mild subarticular and foraminal stenosis on the right  due to spurring. Spinal canal adequate in size. Bilateral facet degeneration. L3-4: Changes of chronic osteomyelitis as described above. Marked left foraminal encroachment due to spurring. Right foramen patent. Spinal canal patent. L4-5: Disc and facet degeneration without stenosis L5-S1: 13 mm anterolisthesis with bilateral pars defects of L5. Moderate left foraminal encroachment. Right foramen patent. IMPRESSION: 1. Negative for acute fracture 2. Moderate scoliosis. Extensive degenerative change throughout the lumbar spine as above 3. Chronic osteomyelitis at L3-4, stable from prior studies. Electronically Signed   By: Franchot Gallo M.D.   On: 10/08/2020 13:12    ____________________________________________   PROCEDURES  Procedure(s) performed (including Critical Care):  Procedures   ____________________________________________   INITIAL IMPRESSION / ASSESSMENT AND PLAN / ED COURSE        Patient presents with above-stated history exam for assessment of some persistent pain that she states began when she twisted in bed on 9/13.  Patient states she was told she had a muscle sprain but feels it is not getting much better over the last couple days.  She has no new injuries or falls or other clear associated sick symptoms.  On arrival she is afebrile and hemodynamically stable.  Right lower extremity is externally rotated which he states is chronic but otherwise she seems to be neurovascular intact in her lower extremities without acute neurological deficit.  She is tender in her L-spine without other significant tenderness or evidence of trauma with bilateral hips or mid back.  She has no history of recent incontinence or any urinary symptoms to suggest acute cystitis or spinal cord compression at this time.  In addition on 9/13 when she was initially evaluated she had a UA obtained that showed no evidence of acute cystitis.  UA today has some bacteria and trace leukocyte esterase but given she  is denying any urinary symptoms I have a low suspicion for clinically significant cystitis and will hold off on antibiotics at this time.  Plain film of the L-spine shows scoliosis and degenerative changes but no clear acute fracture or dislocation.  CT of the L-spine shows no acute fracture but does show moderate scoliosis.  There is also extensive degenerative changes noted.  There is also evidence of chronic osteomyelitis changes from previous infection without evidence of clear acute infection.  There are no acute overlying skin changes no cellulitis no evidence of fever, leukocytosis and ESR of 7 have a low suspicion for acute on chronic osteomyelitis.  Suspect likely musculoskeletal injury.  Patient states he is feeling much better on reassessment.  He was able to ambulate with a walker.  Given stable vitals with eyes reassuring exam work-up 30 stable for discharge with close outpatient PCP follow-up.  Discharged stable condition.  Strict return precautions advised and discussed.     ____________________________________________   FINAL CLINICAL IMPRESSION(S) / ED DIAGNOSES  Final diagnoses:  Acute midline low back pain, unspecified whether sciatica present    Medications  lidocaine (LIDODERM) 5 % 1 patch (1 patch Transdermal Patch Applied 10/08/20 1201)  naproxen (NAPROSYN) tablet 500 mg (500 mg Oral Given 10/08/20 1201)  oxyCODONE (Oxy IR/ROXICODONE) immediate release tablet 2.5 mg (2.5 mg Oral Given 10/08/20 1244)     ED Discharge Orders     None        Note:  This document was prepared using Dragon voice recognition software and may include unintentional dictation errors.    Lucrezia Starch, MD 10/08/20 [phone]

## 2020-10-08 NOTE — ED Notes (Signed)
Pt given ginger ale per request

## 2020-10-08 NOTE — ED Triage Notes (Signed)
Patient states she has had arthritis for a long time and thinks she twisted the wrong way at home, which set off pain.

## 2020-10-08 NOTE — ED Notes (Signed)
Transport at bedside  

## 2020-10-08 NOTE — ED Triage Notes (Signed)
Pt comes into the ED via EMS from home with c/o lower back and leg pain, was seen here for the same 9/13 states the meds she was prescribed is not helping.  143/95 97%RA 80'sHR

## 2020-10-08 NOTE — ED Notes (Signed)
Patient ambulatory to bathroom from room 40 with walker by this RN and Georga Hacking. Pt tolerating ambulating well. MD Tamala Julian aware.

## 2020-10-08 NOTE — ED Notes (Signed)
Patient in xray 

## 2020-10-08 NOTE — ED Notes (Signed)
Pt given pillow per request

## 2020-10-10 ENCOUNTER — Emergency Department: Payer: Medicare Other

## 2020-10-10 ENCOUNTER — Inpatient Hospital Stay
Admission: EM | Admit: 2020-10-10 | Discharge: 2020-10-13 | DRG: 544 | Disposition: A | Discharge status: Skilled Nursing Facility | Payer: Medicare Other | Attending: Student in an Organized Health Care Education/Training Program | Admitting: Student in an Organized Health Care Education/Training Program

## 2020-10-10 ENCOUNTER — Encounter: Payer: Self-pay | Admitting: Internal Medicine

## 2020-10-10 DIAGNOSIS — S32000A Wedge compression fracture of unspecified lumbar vertebra, initial encounter for closed fracture: Secondary | ICD-10-CM | POA: Diagnosis not present

## 2020-10-10 DIAGNOSIS — F0280 Dementia in other diseases classified elsewhere without behavioral disturbance: Secondary | ICD-10-CM | POA: Diagnosis present

## 2020-10-10 DIAGNOSIS — Z90710 Acquired absence of both cervix and uterus: Secondary | ICD-10-CM | POA: Diagnosis not present

## 2020-10-10 DIAGNOSIS — Z79890 Hormone replacement therapy (postmenopausal): Secondary | ICD-10-CM | POA: Diagnosis not present

## 2020-10-10 DIAGNOSIS — G8929 Other chronic pain: Secondary | ICD-10-CM | POA: Diagnosis present

## 2020-10-10 DIAGNOSIS — M549 Dorsalgia, unspecified: Secondary | ICD-10-CM | POA: Diagnosis present

## 2020-10-10 DIAGNOSIS — E1169 Type 2 diabetes mellitus with other specified complication: Secondary | ICD-10-CM | POA: Diagnosis present

## 2020-10-10 DIAGNOSIS — E860 Dehydration: Secondary | ICD-10-CM | POA: Diagnosis present

## 2020-10-10 DIAGNOSIS — Z7984 Long term (current) use of oral hypoglycemic drugs: Secondary | ICD-10-CM

## 2020-10-10 DIAGNOSIS — S32010A Wedge compression fracture of first lumbar vertebra, initial encounter for closed fracture: Secondary | ICD-10-CM | POA: Diagnosis not present

## 2020-10-10 DIAGNOSIS — M4856XA Collapsed vertebra, not elsewhere classified, lumbar region, initial encounter for fracture: Secondary | ICD-10-CM | POA: Diagnosis present

## 2020-10-10 DIAGNOSIS — G2581 Restless legs syndrome: Secondary | ICD-10-CM | POA: Diagnosis present

## 2020-10-10 DIAGNOSIS — M48061 Spinal stenosis, lumbar region without neurogenic claudication: Secondary | ICD-10-CM | POA: Diagnosis present

## 2020-10-10 DIAGNOSIS — E039 Hypothyroidism, unspecified: Secondary | ICD-10-CM

## 2020-10-10 DIAGNOSIS — S32019A Unspecified fracture of first lumbar vertebra, initial encounter for closed fracture: Secondary | ICD-10-CM

## 2020-10-10 DIAGNOSIS — Z9049 Acquired absence of other specified parts of digestive tract: Secondary | ICD-10-CM

## 2020-10-10 DIAGNOSIS — Z885 Allergy status to narcotic agent status: Secondary | ICD-10-CM

## 2020-10-10 DIAGNOSIS — X501XXA Overexertion from prolonged static or awkward postures, initial encounter: Secondary | ICD-10-CM

## 2020-10-10 DIAGNOSIS — E785 Hyperlipidemia, unspecified: Secondary | ICD-10-CM | POA: Diagnosis present

## 2020-10-10 DIAGNOSIS — F419 Anxiety disorder, unspecified: Secondary | ICD-10-CM | POA: Diagnosis present

## 2020-10-10 DIAGNOSIS — E890 Postprocedural hypothyroidism: Secondary | ICD-10-CM | POA: Diagnosis present

## 2020-10-10 DIAGNOSIS — F329 Major depressive disorder, single episode, unspecified: Secondary | ICD-10-CM | POA: Diagnosis present

## 2020-10-10 DIAGNOSIS — Z833 Family history of diabetes mellitus: Secondary | ICD-10-CM | POA: Diagnosis not present

## 2020-10-10 DIAGNOSIS — G4730 Sleep apnea, unspecified: Secondary | ICD-10-CM | POA: Diagnosis present

## 2020-10-10 DIAGNOSIS — T148XXA Other injury of unspecified body region, initial encounter: Secondary | ICD-10-CM

## 2020-10-10 DIAGNOSIS — R32 Unspecified urinary incontinence: Secondary | ICD-10-CM | POA: Diagnosis present

## 2020-10-10 DIAGNOSIS — F32A Depression, unspecified: Secondary | ICD-10-CM

## 2020-10-10 DIAGNOSIS — R296 Repeated falls: Secondary | ICD-10-CM | POA: Diagnosis present

## 2020-10-10 DIAGNOSIS — G20 Parkinson's disease: Secondary | ICD-10-CM

## 2020-10-10 DIAGNOSIS — K219 Gastro-esophageal reflux disease without esophagitis: Secondary | ICD-10-CM | POA: Diagnosis present

## 2020-10-10 DIAGNOSIS — Z79899 Other long term (current) drug therapy: Secondary | ICD-10-CM

## 2020-10-10 DIAGNOSIS — R531 Weakness: Secondary | ICD-10-CM

## 2020-10-10 DIAGNOSIS — M4626 Osteomyelitis of vertebra, lumbar region: Secondary | ICD-10-CM | POA: Diagnosis present

## 2020-10-10 DIAGNOSIS — Z20822 Contact with and (suspected) exposure to covid-19: Secondary | ICD-10-CM | POA: Diagnosis present

## 2020-10-10 DIAGNOSIS — Z8249 Family history of ischemic heart disease and other diseases of the circulatory system: Secondary | ICD-10-CM

## 2020-10-10 DIAGNOSIS — Z66 Do not resuscitate: Secondary | ICD-10-CM | POA: Diagnosis present

## 2020-10-10 DIAGNOSIS — I10 Essential (primary) hypertension: Secondary | ICD-10-CM | POA: Diagnosis present

## 2020-10-10 DIAGNOSIS — E119 Type 2 diabetes mellitus without complications: Secondary | ICD-10-CM

## 2020-10-10 DIAGNOSIS — Z888 Allergy status to other drugs, medicaments and biological substances status: Secondary | ICD-10-CM

## 2020-10-10 LAB — URINALYSIS, COMPLETE (UACMP) WITH MICROSCOPIC
Bilirubin Urine: NEGATIVE
Glucose, UA: NEGATIVE mg/dL
Ketones, ur: 15 mg/dL — AB
Nitrite: NEGATIVE
Protein, ur: NEGATIVE mg/dL
Specific Gravity, Urine: 1.015 (ref 1.005–1.030)
pH: 7 (ref 5.0–8.0)

## 2020-10-10 LAB — CBC WITH DIFFERENTIAL/PLATELET
Abs Immature Granulocytes: 0.03 10*3/uL (ref 0.00–0.07)
Basophils Absolute: 0 10*3/uL (ref 0.0–0.1)
Basophils Relative: 1 %
Eosinophils Absolute: 0.2 10*3/uL (ref 0.0–0.5)
Eosinophils Relative: 2 %
HCT: 43.5 % (ref 36.0–46.0)
Hemoglobin: 14.8 g/dL (ref 12.0–15.0)
Immature Granulocytes: 0 %
Lymphocytes Relative: 24 %
Lymphs Abs: 2.1 10*3/uL (ref 0.7–4.0)
MCH: 33.3 pg (ref 26.0–34.0)
MCHC: 34 g/dL (ref 30.0–36.0)
MCV: 97.8 fL (ref 80.0–100.0)
Monocytes Absolute: 0.5 10*3/uL (ref 0.1–1.0)
Monocytes Relative: 6 %
Neutro Abs: 5.6 10*3/uL (ref 1.7–7.7)
Neutrophils Relative %: 67 %
Platelets: 201 10*3/uL (ref 150–400)
RBC: 4.45 MIL/uL (ref 3.87–5.11)
RDW: 12.9 % (ref 11.5–15.5)
WBC: 8.5 10*3/uL (ref 4.0–10.5)
nRBC: 0 % (ref 0.0–0.2)

## 2020-10-10 LAB — COMPREHENSIVE METABOLIC PANEL
ALT: 10 U/L (ref 0–44)
AST: 37 U/L (ref 15–41)
Albumin: 4.2 g/dL (ref 3.5–5.0)
Alkaline Phosphatase: 81 U/L (ref 38–126)
Anion gap: 10 (ref 5–15)
BUN: 36 mg/dL — ABNORMAL HIGH (ref 8–23)
CO2: 26 mmol/L (ref 22–32)
Calcium: 9.3 mg/dL (ref 8.9–10.3)
Chloride: 105 mmol/L (ref 98–111)
Creatinine, Ser: 0.65 mg/dL (ref 0.44–1.00)
GFR, Estimated: >60 mL/min (ref >60)
Glucose, Bld: 103 mg/dL — ABNORMAL HIGH (ref 70–99)
Potassium: 3.5 mmol/L (ref 3.5–5.1)
Sodium: 141 mmol/L (ref 135–145)
Total Bilirubin: 1.2 mg/dL (ref 0.3–1.2)
Total Protein: 7.2 g/dL (ref 6.5–8.1)

## 2020-10-10 LAB — URINE CULTURE: Culture: >=100000 — AB

## 2020-10-10 LAB — RESP PANEL BY RT-PCR (FLU A&B, COVID) ARPGX2
Influenza A by PCR: NEGATIVE
Influenza B by PCR: NEGATIVE
SARS Coronavirus 2 by RT PCR: NEGATIVE

## 2020-10-10 LAB — HEMOGLOBIN A1C
Hgb A1c MFr Bld: 5.1 % (ref 4.8–5.6)
Mean Plasma Glucose: 99.67 mg/dL

## 2020-10-10 LAB — GLUCOSE, CAPILLARY: Glucose-Capillary: 83 mg/dL (ref 70–99)

## 2020-10-10 LAB — CBG MONITORING, ED: Glucose-Capillary: 90 mg/dL (ref 70–99)

## 2020-10-10 MED ORDER — IOHEXOL 350 MG/ML SOLN
75.0000 mL | Freq: Once | INTRAVENOUS | Status: AC | PRN
  Administered 2020-10-10: 75 mL via INTRAVENOUS

## 2020-10-10 MED ORDER — CITALOPRAM HYDROBROMIDE 20 MG PO TABS: 20.0000 mg | ORAL_TABLET | Freq: Every day | ORAL | Status: DC

## 2020-10-10 MED ORDER — INSULIN ASPART 100 UNIT/ML IJ SOLN: 0.0000 [IU] | Freq: Three times a day (TID) | INTRAMUSCULAR | Status: DC

## 2020-10-10 MED ORDER — PANTOPRAZOLE SODIUM 40 MG PO TBEC
40.0000 mg | DELAYED_RELEASE_TABLET | Freq: Every day | ORAL | Status: DC
  Administered 2020-10-10 – 2020-10-13 (×4): 40 mg via ORAL
  Filled 2020-10-10 (×4): qty 1

## 2020-10-10 MED ORDER — LEVOTHYROXINE SODIUM 50 MCG PO TABS
100.0000 ug | ORAL_TABLET | Freq: Every day | ORAL | Status: DC
  Administered 2020-10-11 – 2020-10-13 (×3): 100 ug via ORAL
  Filled 2020-10-10 (×3): qty 2

## 2020-10-10 MED ORDER — CARBIDOPA-LEVODOPA 25-100 MG PO TABS
2.0000 | ORAL_TABLET | Freq: Four times a day (QID) | ORAL | Status: DC
  Administered 2020-10-10 – 2020-10-13 (×12): 2 via ORAL
  Filled 2020-10-10 (×15): qty 2

## 2020-10-10 MED ORDER — SODIUM CHLORIDE 0.9% FLUSH
3.0000 mL | Freq: Two times a day (BID) | INTRAVENOUS | Status: DC
  Administered 2020-10-10 – 2020-10-13 (×6): 3 mL via INTRAVENOUS

## 2020-10-10 MED ORDER — ONDANSETRON HCL 4 MG/2ML IJ SOLN
4.0000 mg | Freq: Once | INTRAMUSCULAR | Status: AC
  Administered 2020-10-10: 4 mg via INTRAVENOUS
  Filled 2020-10-10: qty 2

## 2020-10-10 MED ORDER — SIMVASTATIN 20 MG PO TABS
20.0000 mg | ORAL_TABLET | Freq: Every day | ORAL | Status: DC
  Administered 2020-10-11 – 2020-10-12 (×3): 20 mg via ORAL
  Filled 2020-10-10 (×2): qty 1

## 2020-10-10 MED ORDER — DOCUSATE SODIUM 100 MG PO CAPS
100.0000 mg | ORAL_CAPSULE | Freq: Two times a day (BID) | ORAL | Status: DC
  Administered 2020-10-10 – 2020-10-13 (×7): 100 mg via ORAL
  Filled 2020-10-10 (×7): qty 1

## 2020-10-10 MED ORDER — ROPINIROLE HCL ER 2 MG PO TB24: 2.0000 mg | ORAL_TABLET | Freq: Every day | ORAL | Status: DC

## 2020-10-10 MED ORDER — ADULT MULTIVITAMIN W/MINERALS CH
1.0000 | ORAL_TABLET | Freq: Every day | ORAL | Status: DC
  Administered 2020-10-10 – 2020-10-13 (×4): 1 via ORAL
  Filled 2020-10-10 (×4): qty 1

## 2020-10-10 MED ORDER — SODIUM CHLORIDE 0.9 % IV SOLN: 250.0000 mL | INTRAVENOUS | Status: DC | PRN

## 2020-10-10 MED ORDER — CALCIUM CARBONATE-VITAMIN D 500-200 MG-UNIT PO TABS
1.0000 | ORAL_TABLET | Freq: Every day | ORAL | Status: DC
  Administered 2020-10-10 – 2020-10-13 (×4): 1 via ORAL
  Filled 2020-10-10 (×4): qty 1

## 2020-10-10 MED ORDER — ALPRAZOLAM 0.5 MG PO TABS
0.5000 mg | ORAL_TABLET | Freq: Every day | ORAL | Status: DC
  Administered 2020-10-10 – 2020-10-11 (×2): 0.5 mg via ORAL
  Filled 2020-10-10 (×2): qty 1

## 2020-10-10 MED ORDER — SERTRALINE HCL 50 MG PO TABS
100.0000 mg | ORAL_TABLET | Freq: Every day | ORAL | Status: DC
  Administered 2020-10-10 – 2020-10-13 (×4): 100 mg via ORAL
  Filled 2020-10-10 (×4): qty 2

## 2020-10-10 MED ORDER — MELATONIN 5 MG PO TABS: 2.5000 mg | ORAL_TABLET | Freq: Every evening | ORAL | Status: DC | PRN

## 2020-10-10 MED ORDER — HYDROCODONE-ACETAMINOPHEN 5-325 MG PO TABS
1.0000 | ORAL_TABLET | Freq: Four times a day (QID) | ORAL | Status: DC | PRN
Start: 2020-10-10 — End: 2020-10-13
  Administered 2020-10-10 – 2020-10-13 (×7): 1 via ORAL
  Filled 2020-10-10 (×7): qty 1

## 2020-10-10 MED ORDER — SODIUM CHLORIDE 0.9% FLUSH: 3.0000 mL | INTRAVENOUS | Status: DC | PRN

## 2020-10-10 MED ORDER — FENTANYL CITRATE PF 50 MCG/ML IJ SOSY
50.0000 ug | PREFILLED_SYRINGE | Freq: Once | INTRAMUSCULAR | Status: AC
  Administered 2020-10-10: 50 ug via INTRAVENOUS
  Filled 2020-10-10: qty 1

## 2020-10-10 MED ORDER — LACTATED RINGERS IV BOLUS
1000.0000 mL | Freq: Once | INTRAVENOUS | Status: AC
  Administered 2020-10-10: 1000 mL via INTRAVENOUS

## 2020-10-10 MED ORDER — LIDOCAINE 4 % EX PTCH: 1.0000 | MEDICATED_PATCH | Freq: Every day | CUTANEOUS | Status: DC

## 2020-10-10 MED ORDER — CELECOXIB 200 MG PO CAPS
200.0000 mg | ORAL_CAPSULE | Freq: Two times a day (BID) | ORAL | Status: DC
  Administered 2020-10-10 – 2020-10-13 (×6): 200 mg via ORAL
  Filled 2020-10-10 (×8): qty 1

## 2020-10-10 MED ORDER — CYCLOBENZAPRINE HCL 10 MG PO TABS
5.0000 mg | ORAL_TABLET | Freq: Three times a day (TID) | ORAL | Status: DC | PRN
  Administered 2020-10-12 – 2020-10-13 (×2): 5 mg via ORAL
  Filled 2020-10-10 (×2): qty 1

## 2020-10-10 MED ORDER — MIRTAZAPINE 15 MG PO TABS: 7.5000 mg | ORAL_TABLET | Freq: Every day | ORAL | Status: DC

## 2020-10-10 MED ORDER — ENOXAPARIN SODIUM 40 MG/0.4ML IJ SOSY
40.0000 mg | PREFILLED_SYRINGE | INTRAMUSCULAR | Status: DC
  Administered 2020-10-10 – 2020-10-12 (×3): 40 mg via SUBCUTANEOUS
  Filled 2020-10-10 (×3): qty 0.4

## 2020-10-10 NOTE — Consult Note (Signed)
Referring Physician:  No referring provider defined for this encounter.  Primary Physician:  Lavera Guise, MD  Chief Complaint:  back pain  History of Present Illness: 10/10/2020 Theresa Mills is a 70 y.o. female who presents with the chief complaint of back pain.  She has history of discitis in the remote past.   She was twisting in bed over the past 2 days and began having severe back pain.  She has no radicular pain or weakness.  She feels weak and dehydrated. She has no symptoms of bowel or bladder dysfunction.   The symptoms are causing a significant impact on the patient's life.   Review of Systems:  A 10 point review of systems is negative, except for the pertinent positives and negatives detailed in the HPI.  Past Medical History: Past Medical History:  Diagnosis Date   Anxiety    Diabetes ([place])    Difficult intravenous access    GERD (gastroesophageal reflux disease)    HBP (high blood pressure)    Hyperlipidemia    Osteoarthritis    Parkinson disease ([place])    Sleep apnea    Thyroid disease     Past Surgical History: Past Surgical History:  Procedure Laterality Date   ABDOMINAL HYSTERECTOMY     CATARACT EXTRACTION Bilateral    CHOLECYSTECTOMY     COLONOSCOPY WITH PROPOFOL N/A 04/02/2015   Procedure: COLONOSCOPY WITH PROPOFOL;  Surgeon: Josefine Class, MD;  Location: [facility] ENDOSCOPY;  Service: Endoscopy;  Laterality: N/A;   ESOPHAGOGASTRODUODENOSCOPY (EGD) WITH PROPOFOL N/A 04/02/2015   Procedure: ESOPHAGOGASTRODUODENOSCOPY (EGD) WITH PROPOFOL;  Surgeon: Josefine Class, MD;  Location: [facility] ENDOSCOPY;  Service: Endoscopy;  Laterality: N/A;   GALLBLADDER SURGERY     LAPAROSCOPIC HYSTERECTOMY     THYROIDECTOMY Left    TONSILLECTOMY     TONSILLECTOMY Bilateral     Allergies: Allergies as of 10/10/2020 - Review Complete 10/10/2020  Allergen Reaction Noted   Codeine Other (See Comments) 11/20/2012   Ephadrene [cholestatin] Other (See Comments)  11/20/2012   Other Other (See Comments) 11/20/2012   Valium [diazepam] Other (See Comments) 11/20/2012   Ephedrine  04/07/2020    Medications: No current facility-administered medications for this encounter.  Current Outpatient Medications:    ALPRAZolam (XANAX) 0.5 MG tablet, , Disp: , Rfl:    Calcium Carbonate-Vitamin D 600-200 MG-UNIT TABS, Take 1 tablet by mouth daily. , Disp: , Rfl:    carbidopa-levodopa (SINEMET CR) 50-200 MG tablet, Take 1 tablet by mouth 2 (two) times daily., Disp: 180 tablet, Rfl: 1   carbidopa-levodopa (SINEMET IR) 25-100 MG tablet, Take 2 tablets by mouth 4 (four) times daily., Disp: 720 tablet, Rfl: 1   celecoxib (CELEBREX) 200 MG capsule, TAKE 1 CAPSULE BY MOUTH TWICE DAILY AS NEEDED, Disp: 60 capsule, Rfl: 1   citalopram (CELEXA) 20 MG tablet, , Disp: , Rfl:    clotrimazole-betamethasone (LOTRISONE) cream, Apply 1 application topically 2 (two) times daily., Disp: 45 g, Rfl: 1   docusate sodium (COLACE) 100 MG capsule, Take 1 capsule (100 mg total) by mouth 2 (two) times daily., Disp: 10 capsule, Rfl: 0   glucose blood test strip, TEST BLOOD SUGAR BID, Disp: , Rfl:    levothyroxine (SYNTHROID) 100 MCG tablet, Take 100 mcg by mouth daily before breakfast. Was changed 09/09/20 by Dr Ronnald Collum.  Was taking 88 mcg, Disp: , Rfl:    Lidocaine 4 % PTCH, Apply 1 patch topically daily., Disp: 15 patch, Rfl: 0   Melatonin 3  MG TABS, Take 3 mg by mouth at bedtime as needed (sleep). , Disp: , Rfl:    metFORMIN (GLUCOPHAGE) 500 MG tablet, TAKE 1 TABLET BY MOUTH TWICE DAILY, Disp: 60 tablet, Rfl: 3   mirtazapine (REMERON) 15 MG tablet, Take 7.5 mg by mouth daily., Disp: , Rfl:    Multiple Vitamins-Minerals (MULTIVITAMIN WOMEN 50+ PO), Take 1 tablet by mouth daily. , Disp: , Rfl:    omeprazole (PRILOSEC) 40 MG capsule, TAKE 1 CAPSULE BY MOUTH EVERY DAY, Disp: 90 capsule, Rfl: 1   ONETOUCH DELICA LANCETS [id number] MISC, TEST BLOOD SUGAR BID, Disp: , Rfl:    rOPINIRole (REQUIP XL) 2  MG 24 hr tablet, Take 2 mg by mouth at bedtime. , Disp: , Rfl:    rOPINIRole (REQUIP) 2 MG tablet, TAKE ONE TABLET BY MOUTH AT BEDTIME FOR RESTLESS LEG, Disp: 90 tablet, Rfl: 1   sertraline (ZOLOFT) 100 MG tablet, Take 1 tablet (100 mg total) by mouth daily., Disp: 30 tablet, Rfl: 3   sertraline (ZOLOFT) 50 MG tablet, TAKE ONE AND A HALF TABLETS BY MOUTH EVERY DAY (Patient not taking: Reported on 09/09/2020), Disp: 45 tablet, Rfl: 3   simvastatin (ZOCOR) 40 MG tablet, Take one tab po qhs for cholesterol, Disp: 90 tablet, Rfl: 3   SYNTHROID 88 MCG tablet, Take 88 mcg by mouth every morning., Disp: , Rfl:    Social History: Social History   Tobacco Use   Smoking status: Never   Smokeless tobacco: Never  Vaping Use   Vaping Use: Never used  Substance Use Topics   Alcohol use: No   Drug use: No    Family Medical History: Family History  Problem Relation Age of Onset   Asthma Father    Cancer Sister    Stroke Maternal Uncle    Heart disease Maternal Uncle    Diabetes Maternal Uncle     Physical Examination: Vitals:   10/10/20 0645 10/10/20 0730  BP: (!) 156/88 (!) 157/84  Pulse: 70 76  Resp: 20 19  Temp:    SpO2: 100% 95%     General: Patient is well developed, well nourished, calm, collected, and in no apparent distress.  Psychiatric: Patient is non-anxious.  Head:  Pupils equal, round, and reactive to light.  ENT:  Oral mucosa appears well hydrated.  Neck:   Supple.  Full range of motion.  Respiratory: Patient is breathing without any difficulty.  Extremities: No edema.  Vascular: Palpable pulses in dorsal pedal vessels.  Skin:   On exposed skin, there are no abnormal skin lesions.  NEUROLOGICAL:  General: In no acute distress.   Awake, alert, oriented to person, place, and time.  Pupils equal round and reactive to light.  Facial tone is symmetric.  Tongue protrusion is midline.  There is no pronator drift.   Strength: Side Biceps Triceps Deltoid  Interossei Grip Wrist Ext. Wrist Flex.  R '5 5 5 5 5 5 5  '$ L '5 5 5 5 5 5 5   '$ Side Iliopsoas Quads Hamstring PF DF EHL  R '5 5 5 5 5 5  '$ L '5 5 5 5 5 5    '$ Bilateral upper and lower extremity sensation is intact to light touch. Reflexes are 1+ and symmetric at the biceps, triceps, brachioradialis, patella and achilles. Hoffman's is absent.  Clonus is not present.  Toes are down-going.    Gait is untested - spine fracture  Imaging: CT L spine 10/10/2020 IMPRESSION: 1. Nondisplaced acute L1 body fracture.  2. Sequela of L3-4 discitis osteomyelitis, advanced degenerative disease, L5 chronic pars defects, and scoliosis/listhesis. 3. Multilevel foraminal narrowing with marked left foraminal impingement at L3-4 and L5-S1. 4. Advanced spinal stenosis at L3-4.     Electronically Signed   By: Jorje Guild M.D.   On: 10/10/2020 07:12  I have personally reviewed the images and agree with the above interpretation.  Labs: CBC Latest Ref Rng & Units 10/10/2020 10/08/2020 05/21/2020  WBC 4.0 - 10.5 K/uL 8.5 7.9 6.4  Hemoglobin 12.0 - 15.0 g/dL 14.8 14.7 13.7  Hematocrit 36.0 - 46.0 % 43.5 44.7 42.4  Platelets 150 - 400 K/uL 201 194 196       Assessment and Plan: Ms. Rodrick is a pleasant 70 y.o. female with L1 vertebral body fracture.  She may have ALL involvement but only on 1 side of the vertebral body.  Given her other medical issues, I would favor trial of a brace before considering operative fixation.    - TLSO - Being admitted to medicine for back pain and weakness - We will follow for upright xrays.    Rockland Kotarski K. Izora Ribas MD, [place] [facility]

## 2020-10-10 NOTE — ED Notes (Signed)
RN went into pt room due to low O2 sat. Pt was sleeping, RN woke pt up and her sat improved to normal range. RN applied 2l/min via [place] on pt while she is sleeping.

## 2020-10-10 NOTE — H&P (Signed)
History and Physical    MAKENSI FAILING [id number] DOB: 08-Sep-1950 DOA: 10/10/2020  PCP: Lavera Guise, MD   Patient coming from: Home  I have personally briefly reviewed patient's old medical records in [place]  Chief Complaint: Low back pain/difficulty with ambulation  HPI: Theresa Mills is a 70 y.o. female with medical history significant for Parkinson's disease, diabetes mellitus, hypothyroidism, hypertension, GERD who presents to the ER for evaluation of back pain which she has had for several days per patient. She states that she had leaned over to make her bed and thinks that she twisted her back and has had severe pain in her lower back for several days.  She denies any falls.  She rates her pain an 8 x 10 in intensity at its worst and states that pain is exacerbated by any form of movement.  She was seen in the ER 2 days ago for same and was discharged home.  She has not been able to ambulate with her walker due to the severity of the pain in her back. She is able to move her lower extremities and denies having any fecal incontinence but states that she has urinary incontinence.  She is unable to tell me if the urinary incontinence is new or worse in the last couple of days. She denies having any chest pain, no shortness of breath, no abdominal pain, no nausea, no vomiting, no dizziness, no lightheadedness, no headache, no blurred vision, no focal deficits, no urinary frequency, no nocturia, no dysuria, no palpitations or diaphoresis. Labs show sodium 141, potassium 3.5, chloride 105, bicarb 26, glucose 113, BUN 36, creatinine 0.65, calcium 9.3, alkaline phosphatase 81, albumin 4.2, AST 37, ALT 10, total protein 7.2, white count 8.5, hemoglobin 14.8, hematocrit 43.5, MCV 97.8, RDW 12.9, platelet count 201 Respiratory viral panel is negative Chest x-ray reviewed by me shows no evidence of active disease CT scan of the head without contrast shows no acute intracranial  abnormality. Atrophy with chronic small vessel white matter ischemic disease. CT scan of abdomen and pelvis showed 3 x 3 x 3 mm distal left ureteral stone without appreciable secondary changes in the left kidney or ureter. Bilateral nonobstructing nephrolithiasis. Acute oblique fracture through the inferior L1 vertebral body without substantial loss of vertebral body height. Further compression deformity at the L4 compression fracture seen previously. Aortic Atherosclerosis  Twelve-lead EKG reviewed by me shows sinus rhythm    ED Course: Patient is a 70 year old female with multiple medical problems who presents to the ER twice in the last couple of days for evaluation of low back pain. She denies having any falls but states that she twisted her back. Imaging shows a nondisplaced acute L1 body fracture with advanced spinal stenosis at L3 and L4. Patient lives alone and has a caregiver during the day, she ambulates with a rolling walker but has been unable to ambulate due to severe back pain. She will be be admitted to the hospital for further evaluation.  Review of Systems: As per HPI otherwise all other systems reviewed and negative.    Past Medical History:  Diagnosis Date   Anxiety    Diabetes ([place])    Difficult intravenous access    GERD (gastroesophageal reflux disease)    HBP (high blood pressure)    Hyperlipidemia    Osteoarthritis    Parkinson disease ([place])    Sleep apnea    Thyroid disease     Past Surgical History:  Procedure Laterality  Date   ABDOMINAL HYSTERECTOMY     CATARACT EXTRACTION Bilateral    CHOLECYSTECTOMY     COLONOSCOPY WITH PROPOFOL N/A 04/02/2015   Procedure: COLONOSCOPY WITH PROPOFOL;  Surgeon: Josefine Class, MD;  Location: [facility] ENDOSCOPY;  Service: Endoscopy;  Laterality: N/A;   ESOPHAGOGASTRODUODENOSCOPY (EGD) WITH PROPOFOL N/A 04/02/2015   Procedure: ESOPHAGOGASTRODUODENOSCOPY (EGD) WITH PROPOFOL;  Surgeon: Josefine Class, MD;  Location:  [facility] ENDOSCOPY;  Service: Endoscopy;  Laterality: N/A;   GALLBLADDER SURGERY     LAPAROSCOPIC HYSTERECTOMY     THYROIDECTOMY Left    TONSILLECTOMY     TONSILLECTOMY Bilateral      reports that she has never smoked. She has never used smokeless tobacco. She reports that she does not drink alcohol and does not use drugs.  Allergies  Allergen Reactions   Codeine Other (See Comments)    HALLUCINATIONS   Ephadrene [Cholestatin] Other (See Comments)    HYPER AND NERVOUS   Other Other (See Comments)    HYPER  HYPER AND NERVOUS   Valium [Diazepam] Other (See Comments)    OVERLY SENSITIVE/TOO STRONG   Ephedrine     Family History  Problem Relation Age of Onset   Asthma Father    Cancer Sister    Stroke Maternal Uncle    Heart disease Maternal Uncle    Diabetes Maternal Uncle       Prior to Admission medications   Medication Sig Start Date End Date Taking? Authorizing Provider  ALPRAZolam Duanne Moron) 0.5 MG tablet  07/28/19   [provider]  Calcium Carbonate-Vitamin D 600-200 MG-UNIT TABS Take 1 tablet by mouth daily.     [provider]  carbidopa-levodopa (SINEMET CR) 50-200 MG tablet Take 1 tablet by mouth 2 (two) times daily. 08/12/20   Lavera Guise, MD  carbidopa-levodopa (SINEMET IR) 25-100 MG tablet Take 2 tablets by mouth 4 (four) times daily. 08/12/20   Lavera Guise, MD  celecoxib (CELEBREX) 200 MG capsule TAKE 1 CAPSULE BY MOUTH TWICE DAILY AS NEEDED 07/29/20   Lavera Guise, MD  citalopram (CELEXA) 20 MG tablet  11/10/19   [provider]  clotrimazole-betamethasone (LOTRISONE) cream Apply 1 application topically 2 (two) times daily. 07/08/20   Jonetta Osgood, NP  docusate sodium (COLACE) 100 MG capsule Take 1 capsule (100 mg total) by mouth 2 (two) times daily. 08/09/19   Fritzi Mandes, MD  glucose blood test strip TEST BLOOD SUGAR BID 01/27/16   [provider]  levothyroxine (SYNTHROID) 100 MCG tablet Take 100 mcg by mouth daily before  breakfast. Was changed 09/09/20 by Dr Ronnald Collum.  Was taking 88 mcg    [provider]  Lidocaine 4 % PTCH Apply 1 patch topically daily. 10/05/20   Triplett, Johnette Abraham B, FNP  Melatonin 3 MG TABS Take 3 mg by mouth at bedtime as needed (sleep).     [provider]  metFORMIN (GLUCOPHAGE) 500 MG tablet TAKE 1 TABLET BY MOUTH TWICE DAILY 08/18/20   Lavera Guise, MD  mirtazapine (REMERON) 15 MG tablet Take 7.5 mg by mouth daily. 03/30/20   [provider]  Multiple Vitamins-Minerals (MULTIVITAMIN WOMEN 50+ PO) Take 1 tablet by mouth daily.     [provider]  omeprazole (PRILOSEC) 40 MG capsule TAKE 1 CAPSULE BY MOUTH EVERY DAY 06/17/20   Lavera Guise, MD  [facility] DELICA LANCETS [id number] MISC TEST BLOOD SUGAR BID 01/27/16   [provider]  rOPINIRole (REQUIP XL) 2 MG 24 hr tablet  Take 2 mg by mouth at bedtime.     [provider]  rOPINIRole (REQUIP) 2 MG tablet TAKE ONE TABLET BY MOUTH AT BEDTIME FOR RESTLESS LEG 07/25/20   Lavera Guise, MD  sertraline (ZOLOFT) 100 MG tablet Take 1 tablet (100 mg total) by mouth daily. 09/09/20   Lavera Guise, MD  sertraline (ZOLOFT) 50 MG tablet TAKE ONE AND A HALF TABLETS BY MOUTH EVERY DAY Patient not taking: Reported on 09/09/2020 06/02/20   Lavera Guise, MD  simvastatin (ZOCOR) 40 MG tablet Take one tab po qhs for cholesterol 07/06/20   Lavera Guise, MD  SYNTHROID 88 MCG tablet Take 88 mcg by mouth every morning. 08/18/20   [provider]    Physical Exam: Vitals:   10/10/20 0530 10/10/20 0600 10/10/20 0645 10/10/20 0730  BP: 133/71 (!) 147/79 (!) 156/88 (!) 157/84  Pulse: 64 64 70 76  Resp: '18 18 20 19  '$ Temp:      TempSrc:      SpO2: 97% 99% 100% 95%     Vitals:   10/10/20 0530 10/10/20 0600 10/10/20 0645 10/10/20 0730  BP: 133/71 (!) 147/79 (!) 156/88 (!) 157/84  Pulse: 64 64 70 76  Resp: '18 18 20 19  '$ Temp:      TempSrc:      SpO2: 97% 99% 100% 95%      Constitutional: Alert and oriented x  3 .  Very restless and appears uncomfortable HEENT:      Head: Normocephalic and atraumatic.         Eyes: PERLA, EOMI, Conjunctivae are normal. Sclera is non-icteric.       Mouth/Throat: Mucous membranes are dry.  Poor oral hygiene      Neck: Supple with no signs of meningismus. Cardiovascular: Regular rate and rhythm. No murmurs, gallops, or rubs. 2+ symmetrical distal pulses are present . No JVD. No LE edema Respiratory: Respiratory effort normal .Lungs sounds clear bilaterally. No wheezes, crackles, or rhonchi.  Gastrointestinal: Soft, non tender, and non distended with positive bowel sounds.  Genitourinary: No CVA tenderness. Musculoskeletal: Paraspinal muscle tenderness in the lower back. No cyanosis, or erythema of extremities. Neurologic:  Face is symmetric. Moving all extremities. No gross focal neurologic deficits . Skin: Skin is warm, dry.  No rash or ulcers Psychiatric: Depressed mood.  Flat affect   Labs on Admission: I have personally reviewed following labs and imaging studies  CBC: Recent Labs  Lab 10/08/20 1354 10/10/20 0530  WBC 7.9 8.5  NEUTROABS 5.0 5.6  HGB 14.7 14.8  HCT 44.7 43.5  MCV 98.0 97.8  PLT 194 [id number]   Basic Metabolic Panel: Recent Labs  Lab 10/08/20 1354 10/10/20 0530  NA 140 141  K 3.6 3.5  CL 105 105  CO2 24 26  GLUCOSE 88 103*  BUN 24* 36*  CREATININE 0.51 0.65  CALCIUM 9.4 9.3   GFR: Estimated Creatinine Clearance: 65.2 mL/min (by C-G formula based on SCr of 0.65 mg/dL). Liver Function Tests: Recent Labs  Lab 10/10/20 0530  AST 37  ALT 10  ALKPHOS 81  BILITOT 1.2  PROT 7.2  ALBUMIN 4.2   No results for input(s): LIPASE, AMYLASE in the last 168 hours. No results for input(s): AMMONIA in the last 168 hours. Coagulation Profile: No results for input(s): INR, PROTIME in the last 168 hours. Cardiac Enzymes: No results for input(s): CKTOTAL, CKMB, CKMBINDEX, TROPONINI in the last 168 hours. BNP (last 3 results) No results  for  input(s): PROBNP in the last 8760 hours. HbA1C: No results for input(s): HGBA1C in the last 72 hours. CBG: No results for input(s): GLUCAP in the last 168 hours. Lipid Profile: No results for input(s): CHOL, HDL, LDLCALC, TRIG, CHOLHDL, LDLDIRECT in the last 72 hours. Thyroid Function Tests: No results for input(s): TSH, T4TOTAL, FREET4, T3FREE, THYROIDAB in the last 72 hours. Anemia Panel: No results for input(s): VITAMINB12, FOLATE, FERRITIN, TIBC, IRON, RETICCTPCT in the last 72 hours. Urine analysis:    Component Value Date/Time   COLORURINE YELLOW 10/10/2020 0531   APPEARANCEUR CLEAR 10/10/2020 0531   APPEARANCEUR Cloudy (A) 09/05/2017 1535   LABSPEC 1.015 10/10/2020 0531   LABSPEC 1.021 01/13/2014 0737   PHURINE 7.0 10/10/2020 0531   GLUCOSEU NEGATIVE 10/10/2020 0531   GLUCOSEU >=500 01/13/2014 0737   HGBUR TRACE (A) 10/10/2020 0531   BILIRUBINUR NEGATIVE 10/10/2020 0531   BILIRUBINUR negative 05/12/2020 0838   BILIRUBINUR Negative 09/05/2017 1535   BILIRUBINUR Negative 01/13/2014 0737   KETONESUR 15 (A) 10/10/2020 0531   PROTEINUR NEGATIVE 10/10/2020 0531   UROBILINOGEN 0.2 05/12/2020 0838   NITRITE NEGATIVE 10/10/2020 0531   LEUKOCYTESUR TRACE (A) 10/10/2020 0531   LEUKOCYTESUR 3+ 01/13/2014 0737    Radiological Exams on Admission: DG Lumbar Spine Complete  Result Date: 10/08/2020 CLINICAL DATA:  Low back pain after twisting injury EXAM: LUMBAR SPINE - COMPLETE 4+ VIEW COMPARISON:  09/30/2019 MRI FINDINGS: Moderate convex left lumbar spine curvature. Sacroiliac joints are symmetric. Osteopenia. Secondary to spinal curvature, the lateral view is limited for evaluation of vertebral body height loss. L2 through L4 vertebral body height loss is grossly similar. Previous intervertebral disc irregularity at L3-4 was consistent with discitis/osteomyelitis on prior MRI. There is also intervertebral disc irregularity at L2-3, new since 09/02/2019 CT. Marked facet arthropathy.  L5-S1 malalignment is grossly similar. IMPRESSION: Limited radiographs, secondary to the extent of spinal curvature and underlying chronic vertebral body height loss and disc irregularity. Grossly similar appearance of vertebral body height loss at L2-4 with intervertebral disc irregularity. Persistent L5-S1 malalignment. Given above limitations, and the patient's complex clinical history (see report of lumbar spine of 09/30/2019), repeat CT or MRI should be considered. Electronically Signed   By: Abigail Miyamoto M.D.   On: 10/08/2020 12:30   CT HEAD WO CONTRAST (5MM)  Result Date: 10/10/2020 CLINICAL DATA:  Mental status changes. EXAM: CT HEAD WITHOUT CONTRAST TECHNIQUE: Contiguous axial images were obtained from the base of the skull through the vertex without intravenous contrast. COMPARISON:  08/19/2019 FINDINGS: Brain: There is no evidence for acute hemorrhage, hydrocephalus, mass lesion, or abnormal extra-axial fluid collection. No definite CT evidence for acute infarction. Diffuse loss of parenchymal volume is consistent with atrophy. Patchy low attenuation in the deep hemispheric and periventricular white matter is nonspecific, but likely reflects chronic microvascular ischemic demyelination. Vascular: No hyperdense vessel or unexpected calcification. Skull: No evidence for fracture. No worrisome lytic or sclerotic lesion. Sinuses/Orbits: The visualized paranasal sinuses and mastoid air cells are clear. Visualized portions of the globes and intraorbital fat are unremarkable. Other: None. IMPRESSION: 1. No acute intracranial abnormality. 2. Atrophy with chronic small vessel white matter ischemic disease. Electronically Signed   By: Misty Stanley M.D.   On: 10/10/2020 07:11   CT Lumbar Spine Wo Contrast  Result Date: 10/08/2020 CLINICAL DATA:  Low back pain. Trauma. History of L3-4 discitis and osteomyelitis. EXAM: CT LUMBAR SPINE WITHOUT CONTRAST TECHNIQUE: Multidetector CT imaging of the lumbar spine  was performed without intravenous contrast administration. Multiplanar  CT image reconstructions were also generated. COMPARISON:  CT lumbar spine 06/10/2017. MRI lumbar spine 09/30/2019 FINDINGS: Segmentation: Normal Alignment: Moderate levoscoliosis. Mild retrolisthesis L1-2, and L2-3. 9 mm retrolisthesis L3-4. 13 mm anterolisthesis L5-S1. Vertebrae: Moderately severe compression fracture of L4 vertebral body, right greater than left. This was not present in 2019 but is similar to the MRI 09/30/2019. There is evidence of chronic osteomyelitis at L3-4 with chronic erosive changes in the endplates which are now sclerotic. No other fracture. Paraspinal and other soft tissues: Negative for paraspinous mass, edema, or fluid collection. Disc levels: L1-2: Disc and facet degeneration with spurring. No significant stenosis L2-3: Moderate disc degeneration and spurring right greater than left. Mild subarticular and foraminal stenosis on the right due to spurring. Spinal canal adequate in size. Bilateral facet degeneration. L3-4: Changes of chronic osteomyelitis as described above. Marked left foraminal encroachment due to spurring. Right foramen patent. Spinal canal patent. L4-5: Disc and facet degeneration without stenosis L5-S1: 13 mm anterolisthesis with bilateral pars defects of L5. Moderate left foraminal encroachment. Right foramen patent. IMPRESSION: 1. Negative for acute fracture 2. Moderate scoliosis. Extensive degenerative change throughout the lumbar spine as above 3. Chronic osteomyelitis at L3-4, stable from prior studies. Electronically Signed   By: Franchot Gallo M.D.   On: 10/08/2020 13:12   CT ABDOMEN PELVIS W CONTRAST  Result Date: 10/10/2020 CLINICAL DATA:  Abdominal pain. EXAM: CT ABDOMEN AND PELVIS WITH CONTRAST TECHNIQUE: Multidetector CT imaging of the abdomen and pelvis was performed using the standard protocol following bolus administration of intravenous contrast. CONTRAST:  30m OMNIPAQUE  IOHEXOL 350 MG/ML SOLN COMPARISON:  09/02/2019 FINDINGS: Lower chest: Unremarkable. Hepatobiliary: No suspicious focal abnormality within the liver parenchyma. Tiny hypodensities in the lateral right liver are stable in the interval, compatible with tiny cysts. Gallbladder surgically absent. No intrahepatic or extrahepatic biliary dilation. Pancreas: No focal mass lesion. No dilatation of the main duct. No intraparenchymal cyst. No peripancreatic edema. Spleen: No splenomegaly. No focal mass lesion. Adrenals/Urinary Tract: No adrenal nodule or mass. Several small nonobstructing stones in the right kidney measure up to 5 mm. Several nonobstructing stones also noted left kidney measuring up to about 6 mm. Stable small interpolar left renal cyst. Despite the lack of secondary changes in the left kidney and ureter, a 3 x 3 x 3 mm stone is identified in the distal left ureter just proximal to the UVJ (image 74/2). No bladder stones. Stomach/Bowel: Stomach is unremarkable. No gastric wall thickening. No evidence of outlet obstruction. Duodenum is normally positioned as is the ligament of Treitz. No small bowel wall thickening. No small bowel dilatation. Terminal ileum and appendix not clearly visualized due to motion artifact. No gross colonic mass. No colonic wall thickening. Vascular/Lymphatic: There is moderate atherosclerotic calcification of the abdominal aorta without aneurysm. There is no gastrohepatic or hepatoduodenal ligament lymphadenopathy. No retroperitoneal or mesenteric lymphadenopathy. No pelvic sidewall lymphadenopathy. Reproductive: Uterus surgically absent.  There is no adnexal mass. Other: No intraperitoneal free fluid. Musculoskeletal: Small umbilical hernia contains only fat. Degenerative changes are noted in the hips, right greater than left interval further loss of vertebral body height at the L4 compression fracture. Advanced degenerative disc disease noted in the lumbar spine. There is a new  oblique fracture through the inferior L1 vertebral body that appears acute (sagittal 97/6). Convex leftward thoracolumbar scoliosis again noted. IMPRESSION: 1. 3 x 3 x 3 mm distal left ureteral stone without appreciable secondary changes in the left kidney or ureter. 2. Bilateral nonobstructing  nephrolithiasis. 3. Acute oblique fracture through the inferior L1 vertebral body without substantial loss of vertebral body height. 4. Further compression deformity at the L4 compression fracture seen previously. 5. Aortic Atherosclerosis (ICD10-I70.0). Electronically Signed   By: Misty Stanley M.D.   On: 10/10/2020 07:20   CT L-SPINE NO CHARGE  Result Date: 10/10/2020 CLINICAL DATA:  Not acting normal.  Back pain EXAM: CT Lumbar spine with contrast TECHNIQUE: Multiplanar CT images of the lumbar spine were reconstructed from contemporary CT of the abdomen and Pelvis CONTRAST:  None additional COMPARISON:  CT from 2 days ago FINDINGS: CT LUMBAR SPINE FINDINGS Segmentation: 5 lumbar type vertebrae Alignment: Scoliosis. Grade 2 anterolisthesis at L5-S1 due to chronic L5 pars defects. Posttraumatic retrolisthesis at L3-4. Vertebrae: Oblique fracture through the body of L1, reaching the inferior endplate, nondisplaced. Chronic superior endplate fracture and depression with retropulsion at L4, non progressed. Remote L3-4 discitis/osteomyelitis with endplate destruction and sclerosis. Paraspinal and other soft tissues: No evidence of perispinal mass or inflammation Disc levels: T12- L1: Bridging osteophyte L1-L2: Bulky endplate osteophytosis eccentric to the right. Facet spurring and moderate foraminal narrowing L2-L3: Disc collapse and eccentric right bulky spurring. Bilateral facet spurring and foraminal impingement. L3-L4: Postinfectious disc and endplate irregularity. Facet spurring asymmetric to the left. Advanced left foraminal impingement. High-grade spinal stenosis L4-L5: Facet spurring asymmetric to the left.  L5-S1:Chronic L5 pars defects with anterolisthesis and disc collapse. Asymmetric left foraminal impingement. Patulous canal. IMPRESSION: 1. Nondisplaced acute L1 body fracture. 2. Sequela of L3-4 discitis osteomyelitis, advanced degenerative disease, L5 chronic pars defects, and scoliosis/listhesis. 3. Multilevel foraminal narrowing with marked left foraminal impingement at L3-4 and L5-S1. 4. Advanced spinal stenosis at L3-4. Electronically Signed   By: Jorje Guild M.D.   On: 10/10/2020 07:12   DG Chest Portable 1 View  Result Date: 10/10/2020 CLINICAL DATA:  Shortness of breath EXAM: PORTABLE CHEST 1 VIEW COMPARISON:  08/19/2019 FINDINGS: Normal heart size and mediastinal contours. There is no edema, consolidation, effusion, or pneumothorax. No acute osseous finding. IMPRESSION: No evidence of active disease. Electronically Signed   By: Jorje Guild M.D.   On: 10/10/2020 04:57     Assessment/Plan Principal Problem:   Compression fracture of lumbar vertebra (HCC) Active Problems:   Depression   Diabetes mellitus type 2, uncomplicated (HCC)   Essential hypertension   Parkinson disease ([place])   Acquired hypothyroidism   Back pain     Patient is a 70 year old female with a history of Parkinson's disease, history of frequent falls but none recently who presents to the ER for evaluation of low back pain and difficulty with ambulation and is found to have a nondisplaced acute L1 body fracture.      Acute L1 nondisplaced fracture Patient denies any recent falls and states that she twisted her back Supportive care with pain control and muscle relaxants We will request neurosurgery consult PT evaluation once cleared by neurosurgery   Anxiety disorder Continue alprazolam     Parkinson's disease Continue Sinemet and Ropinirole     Depression Stable Continue Celexa, Remeron and Zoloft     Diabetes mellitus Hold Metformin while patient is hospitalized Place patient on sliding  scale coverage with insulin Maintain consistent carbohydrate diet       Hypothyroidism Continue Synthroid  DVT prophylaxis: Lovenox  Code Status: DNR Family Communication: Greater than 50% of time was spent discussing patient's condition and plan of care with her at the bedside.  All questions and concerns have been addressed.  She verbalizes understanding and agrees with the plan.  CODE STATUS was discussed that she is a DNR Disposition Plan: Back to previous home environment Consults called: Neurosurgery Status:At the time of admission, it appears that the appropriate admission status for this patient is inpatient. This is judged to be reasonable and necessary to provide the required intensity of service to ensure the patient's safety given the presenting symptoms, physical exam findings, and initial radiographic and laboratory data in the context of their comorbid conditions. Patient requires inpatient status due to high intensity of service, high risk for further deterioration and high frequency of surveillance required.     Collier Bullock MD [facility]     10/10/2020, 9:26 AM

## 2020-10-10 NOTE — ED Triage Notes (Signed)
Patient sent from home via EMS by caregiver due to patient "not acting normal". Baseline not established from caregiver, caregiver stated patient usually ambulates but has not been able to do so "lately". Patient states she walked on Friday and a little on Saturday. Patient is alert and oriented, answers questions correctly for this RN. States her back hurts, denies any falls, patient has history of chronic back pain. CBG 123 per EMS.

## 2020-10-10 NOTE — ED Notes (Signed)
Pt requesting morning meds. Pharmacy stated they will verify and send them now.

## 2020-10-10 NOTE — ED Provider Notes (Signed)
-----------------------------------------   7:28 AM on 10/10/2020 -----------------------------------------  I took over care of this patient from Dr. Alfred Levins.  CT scan results are as follows:  CT abdomen/pelvis: IMPRESSION:  1. 3 x 3 x 3 mm distal left ureteral stone without appreciable  secondary changes in the left kidney or ureter.  2. Bilateral nonobstructing nephrolithiasis.  3. Acute oblique fracture through the inferior L1 vertebral body  without substantial loss of vertebral body height.  4. Further compression deformity at the L4 compression fracture seen  previously.  5. Aortic Atherosclerosis (ICD10-I70.0).   CT lumbar spine: IMPRESSION:  1. Nondisplaced acute L1 body fracture.  2. Sequela of L3-4 discitis osteomyelitis, advanced degenerative  disease, L5 chronic pars defects, and scoliosis/listhesis.  3. Multilevel foraminal narrowing with marked left foraminal  impingement at L3-4 and L5-S1.  4. Advanced spinal stenosis at L3-4.   CT head:   IMPRESSION:  1. No acute intracranial abnormality.  2. Atrophy with chronic small vessel white matter ischemic disease.       I consulted Dr. Izora Ribas from neurosurgery who has seen the patient.  He recommends a brace and will follow.  I consulted Dr. Francine Graven from the hospitalist service for admission.   Arta Silence, MD 10/10/20 [phone]

## 2020-10-10 NOTE — ED Provider Notes (Signed)
[facility] Emergency Department Provider Note  ____________________________________________  Time seen: Approximately 4:51 AM  I have reviewed the triage vital signs and the nursing notes.   HISTORY  Chief Complaint Altered Mental Status   HPI Theresa Mills is a 70 y.o. female with a history of Parkinson's disease, diabetes, hypothyroidism, hypertension, hyperlipidemia, GERD, epidural abscess/discitis who presents for several complaints.  Patient was seen here 2 days ago for back pain.  She has had back pain for while but this current exacerbation started a week ago after she twisted in bed.  Patient was here for 10 hours and did not receive any of her Parkinson's or other medications.  She reports that when she got home her Parkinson's tremor and speech was impaired which she usually gets when she does not take her Parkinson medication.  She also has had decreased appetite, has not been eating or drinking much, has had pretty significant nausea since being discharged home.  She denies any fever or chills, dysuria or hematuria, vomiting, constipation, diarrhea.  She is also complaining of shortness of breath which has been ongoing for 2 days.  She reports that because she does not drink anything she feels extremely dehydrated and extremely weak.  Today she has been unable to walk due to generalized weakness plus her back pain.  She has been taking Tylenol arthritis which she thinks could be the reason why her stomach upset.  She also reports that she was unable to speak earlier today and she felt that it was because her Parkinson's and because her mouth was very dry.  Her speech has significantly improved.  She lives at home but has caretaker on a daily basis.  Usually is able to ambulate with a walker.  Has not been able to ambulate all day today.   Past Medical History:  Diagnosis Date   Anxiety    Diabetes ([place])    Difficult intravenous access    GERD  (gastroesophageal reflux disease)    HBP (high blood pressure)    Hyperlipidemia    Osteoarthritis    Parkinson disease ([place])    Sleep apnea    Thyroid disease     Patient Active Problem List   Diagnosis Date Noted   Major depression in remission ([place]) 09/23/2020   Hav (hallux abducto valgus), unspecified laterality 09/23/2020   Back pain 09/30/2019   Epidural abscess 09/30/2019   Psoas abscess, right ([place]) 09/30/2019   High anion gap metabolic acidosis 99991111   Lumbar compression fracture ([place]) 08/25/2019   Osteomyelitis of lumbar vertebra ([place]) 08/25/2019   Discitis 08/25/2019   Malnutrition of moderate degree 08/20/2019   Severe sepsis ([place]) 08/19/2019   Bilateral hydronephrosis 08/19/2019   Acute pyelonephritis [id number]   Acute metabolic encephalopathy [id number]   Acute urinary retention 08/19/2019   AKI (acute kidney injury) ([place]) 08/19/2019   Osteoarthritis of multiple joints    Frequent falls 08/05/2019   Urinary tract infection with hematuria 06/11/2019   Nausea and vomiting 02/13/2019   Acquired hypothyroidism 02/13/2019   Acquired bilateral hammer toes 01/23/2019   Type 2 diabetes mellitus with hyperglycemia ([place]) 09/15/2018   Encounter for general adult medical examination with abnormal findings 09/05/2017   Dysuria 09/05/2017   Uncontrolled type 2 diabetes mellitus with hyperglycemia ([place]) 06/18/2017   Rib fractures 06/11/2017   Pressure injury of coccygeal region, stage 2 ([place]) 06/11/2017   Rhabdomyolysis 06/10/2017   Elevated troponin 06/10/2017   Hypernatremia 06/10/2017   Fall 06/10/2017  Arthritis 01/08/2017   Cataracts, bilateral 01/08/2017   Chickenpox 01/08/2017   Shingles 01/08/2017   Depression 01/08/2017   Diabetes mellitus type 2, uncomplicated ([place]) 0000000   Hyperlipidemia, unspecified 01/08/2017   Essential hypertension 01/08/2017   Migraine headache 01/08/2017   Thyroid disease 01/08/2017   Parkinson disease ([place]) 01/08/2017    Anxiety, generalized 09/01/2016   Dementia due to Parkinson's disease without behavioral disturbance ([place]) 09/01/2016   History of depression 09/01/2016   Morbid obesity with BMI of 40.0-44.9, adult ([place]) 09/01/2016   Restless leg syndrome 09/01/2016   Class 2 severe obesity with serious comorbidity in adult [facility]) 09/01/2016    Past Surgical History:  Procedure Laterality Date   ABDOMINAL HYSTERECTOMY     CATARACT EXTRACTION Bilateral    CHOLECYSTECTOMY     COLONOSCOPY WITH PROPOFOL N/A 04/02/2015   Procedure: COLONOSCOPY WITH PROPOFOL;  Surgeon: Josefine Class, MD;  Location: [facility] ENDOSCOPY;  Service: Endoscopy;  Laterality: N/A;   ESOPHAGOGASTRODUODENOSCOPY (EGD) WITH PROPOFOL N/A 04/02/2015   Procedure: ESOPHAGOGASTRODUODENOSCOPY (EGD) WITH PROPOFOL;  Surgeon: Josefine Class, MD;  Location: [facility] ENDOSCOPY;  Service: Endoscopy;  Laterality: N/A;   GALLBLADDER SURGERY     LAPAROSCOPIC HYSTERECTOMY     THYROIDECTOMY Left    TONSILLECTOMY     TONSILLECTOMY Bilateral     Prior to Admission medications   Medication Sig Start Date End Date Taking? Authorizing Provider  ALPRAZolam Duanne Moron) 0.5 MG tablet  07/28/19   [provider]  Calcium Carbonate-Vitamin D 600-200 MG-UNIT TABS Take 1 tablet by mouth daily.     [provider]  carbidopa-levodopa (SINEMET CR) 50-200 MG tablet Take 1 tablet by mouth 2 (two) times daily. 08/12/20   Lavera Guise, MD  carbidopa-levodopa (SINEMET IR) 25-100 MG tablet Take 2 tablets by mouth 4 (four) times daily. 08/12/20   Lavera Guise, MD  celecoxib (CELEBREX) 200 MG capsule TAKE 1 CAPSULE BY MOUTH TWICE DAILY AS NEEDED 07/29/20   Lavera Guise, MD  citalopram (CELEXA) 20 MG tablet  11/10/19   [provider]  clotrimazole-betamethasone (LOTRISONE) cream Apply 1 application topically 2 (two) times daily. 07/08/20   Jonetta Osgood, NP  docusate sodium (COLACE) 100 MG capsule Take 1 capsule (100 mg total) by mouth 2 (two)  times daily. 08/09/19   Fritzi Mandes, MD  glucose blood test strip TEST BLOOD SUGAR BID 01/27/16   [provider]  levothyroxine (SYNTHROID) 100 MCG tablet Take 100 mcg by mouth daily before breakfast. Was changed 09/09/20 by Dr Ronnald Collum.  Was taking 88 mcg    [provider]  Lidocaine 4 % PTCH Apply 1 patch topically daily. 10/05/20   Triplett, Johnette Abraham B, FNP  Melatonin 3 MG TABS Take 3 mg by mouth at bedtime as needed (sleep).     [provider]  metFORMIN (GLUCOPHAGE) 500 MG tablet TAKE 1 TABLET BY MOUTH TWICE DAILY 08/18/20   Lavera Guise, MD  mirtazapine (REMERON) 15 MG tablet Take 7.5 mg by mouth daily. 03/30/20   [provider]  Multiple Vitamins-Minerals (MULTIVITAMIN WOMEN 50+ PO) Take 1 tablet by mouth daily.     [provider]  omeprazole (PRILOSEC) 40 MG capsule TAKE 1 CAPSULE BY MOUTH EVERY DAY 06/17/20   Lavera Guise, MD  [facility] DELICA LANCETS [id number] MISC TEST BLOOD SUGAR BID 01/27/16   [provider]  rOPINIRole (REQUIP XL) 2 MG 24 hr tablet Take 2 mg by mouth at bedtime.     [provider]  rOPINIRole (REQUIP) 2 MG tablet TAKE ONE TABLET BY MOUTH AT BEDTIME FOR RESTLESS LEG 07/25/20   Lavera Guise, MD  sertraline (ZOLOFT) 100 MG tablet Take 1 tablet (100 mg total) by mouth daily. 09/09/20   Lavera Guise, MD  sertraline (ZOLOFT) 50 MG tablet TAKE ONE AND A HALF TABLETS BY MOUTH EVERY DAY Patient not taking: Reported on 09/09/2020 06/02/20   Lavera Guise, MD  simvastatin (ZOCOR) 40 MG tablet Take one tab po qhs for cholesterol 07/06/20   Lavera Guise, MD  SYNTHROID 88 MCG tablet Take 88 mcg by mouth every morning. 08/18/20   [provider]    Allergies Codeine, Ephadrene [cholestatin], Other, Valium [diazepam], and Ephedrine  Family History  Problem Relation Age of Onset   Asthma Father    Cancer Sister    Stroke Maternal Uncle    Heart disease Maternal Uncle    Diabetes Maternal Uncle     Social  History Social History   Tobacco Use   Smoking status: Never   Smokeless tobacco: Never  Vaping Use   Vaping Use: Never used  Substance Use Topics   Alcohol use: No   Drug use: No    Review of Systems  Constitutional: Negative for fever. + generalized weakness Eyes: Negative for visual changes. ENT: Negative for sore throat. Neck: No neck pain  Cardiovascular: Negative for chest pain. Respiratory: + shortness of breath. Gastrointestinal: Negative for abdominal pain, vomiting or diarrhea. + nausea Genitourinary: Negative for dysuria. Musculoskeletal: Negative for back pain. Skin: Negative for rash. Neurological: Negative for headaches, weakness or numbness. Psych: No SI or HI  ____________________________________________   PHYSICAL EXAM:  VITAL SIGNS: ED Triage Vitals [10/10/20 0347]  Enc Vitals Group     BP (!) 155/74     Pulse Rate 78     Resp 18     Temp 97.9 F (36.6 C)     Temp Source Oral     SpO2 100 %     Weight      Height      Head Circumference      Peak Flow      Pain Score      Pain Loc      Pain Edu?      Excl. in [place]?     Constitutional: Alert and oriented. Well appearing and in no apparent distress. HEENT:      Head: Normocephalic and atraumatic.         Eyes: Conjunctivae are normal. Sclera is non-icteric.       Mouth/Throat: Mucous membranes are dry.       Neck: Supple with no signs of meningismus. Cardiovascular: Regular rate and rhythm. No murmurs, gallops, or rubs. 2+ symmetrical distal pulses are present in all extremities. No JVD. Respiratory: Normal respiratory effort. Lungs are clear to auscultation bilaterally.  Gastrointestinal: Soft, mild diffuse tenderness to palpation, and non distended with positive bowel sounds. No rebound or guarding. Genitourinary: No CVA tenderness. Musculoskeletal:  No edema, cyanosis, or erythema of extremities. Neurologic: Normal speech and language. Face is symmetric. Unable to lift any of her legs  from bed. UE strength is normal. Skin: Skin is warm, dry and intact. No rash noted. Psychiatric: Mood and affect are normal. Speech and behavior are normal.  ____________________________________________   LABS (all labs ordered are listed, but only abnormal results are displayed)  Labs Reviewed  RESP PANEL BY RT-PCR (FLU A&B, COVID) [id number]  CBC WITH DIFFERENTIAL/PLATELET  COMPREHENSIVE METABOLIC  PANEL  URINALYSIS, COMPLETE (UACMP) WITH MICROSCOPIC   ____________________________________________  EKG  ED ECG REPORT I, Rudene Re, the attending physician, personally viewed and interpreted this ECG.  Sinus rhythm with a rate of 72, normal intervals, left axis deviation, inferior and anterior Q waves with no ST elevations or depressions. ____________________________________________  RADIOLOGY  Imaging pending   ____________________________________________   PROCEDURES  Procedure(s) performed:yes .1-3 Lead EKG Interpretation Performed by: Rudene Re, MD Authorized by: Rudene Re, MD     Interpretation: abnormal     ECG rate assessment: normal     Rhythm: sinus rhythm     Ectopy: none     Conduction: normal     Critical Care performed:  None ____________________________________________   INITIAL IMPRESSION / ASSESSMENT AND PLAN / ED COURSE  70 y.o. female with a history of Parkinson's disease, diabetes, hypothyroidism, hypertension, hyperlipidemia, GERD, epidural abscess/discitis who presents for several complaints.   #back pain: Acute on chronic.  Prior history of discitis and osteomyelitis.  No significant tenderness to palpation on midline spine.  She does have weakness of bilateral lower extremities. Ddx recurrent disciitis, osteomyelitis, worsening arthritis, disc disease, pyelonephritis.  Repeat CT with contrast this time.  Will give IV fentanyl for pain.  #speech difficulties: Patient attributes that to missing 2 doses of her Parkinson's  medications 2 days ago when she was in the hospital.  Her speech pretty fluent at this time, her mouth is extremely dry which could be contributing to her speech difficulty.  She has no other localized neurodeficits concerning for stroke but will get a head CT.  Will give IV fluids.  #Inability to walk: Could be from generalized weakness from being in the hospital 2 days ago, dehydration, etiologies causing her back pain.  #nausea and inability to tolerate p.o.: Started after patient was started on Tylenol arthritis.  Could be a side effect of the medication.  She is slightly diffusely tender to palpation in her abdomen with no distention.  We will get a CT abdomen pelvis to rule out any acute pathology.  Blood work is pending.  Will give IV fluids and IV Zofran.  Old medical records review including CT imaging from prior visit to the ED 2 days ago and MRI done a year ago.  Patient placed on telemetry for close monitoring of cardiorespiratory status.   _____________________________________  Imaging pending. Care transferred to Dr. Cherylann Banas at [facility]    _____________________________________________ Please note:  Patient was evaluated in Emergency Department today for the symptoms described in the history of present illness. Patient was evaluated in the context of the global COVID-19 pandemic, which necessitated consideration that the patient might be at risk for infection with the SARS-CoV-2 virus that causes COVID-19. Institutional protocols and algorithms that pertain to the evaluation of patients at risk for COVID-19 are in a state of rapid change based on information released by regulatory bodies including the CDC and federal and state organizations. These policies and algorithms were followed during the patient's care in the ED.  Some ED evaluations and interventions may be delayed as a result of limited staffing during the pandemic.   [place] Controlled Substance Database was reviewed by  me. ____________________________________________   FINAL CLINICAL IMPRESSION(S) / ED DIAGNOSES  Back pain Generalized weakness nausea    NEW MEDICATIONS STARTED DURING THIS VISIT:  ED Discharge Orders     None        Note:  This document was prepared using Dragon voice recognition software and may include  unintentional dictation errors.    Rudene Re, MD 10/10/20 2306

## 2020-10-10 NOTE — Progress Notes (Signed)
Orthopedic Tech Progress Note Patient Details:  Theresa Mills [birth year] [id number]  Patient ID: [name], female   DOB: Apr 19, 1950, 70 y.o.   MRN: [id number] Called order into [employer]. Karolee Stamps 10/10/2020, 8:23 AM

## 2020-10-11 DIAGNOSIS — S32010A Wedge compression fracture of first lumbar vertebra, initial encounter for closed fracture: Secondary | ICD-10-CM | POA: Diagnosis not present

## 2020-10-11 LAB — CBC
HCT: 40 % (ref 36.0–46.0)
Hemoglobin: 13.5 g/dL (ref 12.0–15.0)
MCH: 33.1 pg (ref 26.0–34.0)
MCHC: 33.8 g/dL (ref 30.0–36.0)
MCV: 98 fL (ref 80.0–100.0)
Platelets: 194 10*3/uL (ref 150–400)
RBC: 4.08 MIL/uL (ref 3.87–5.11)
RDW: 12.4 % (ref 11.5–15.5)
WBC: 7.4 10*3/uL (ref 4.0–10.5)
nRBC: 0 % (ref 0.0–0.2)

## 2020-10-11 LAB — BASIC METABOLIC PANEL
Anion gap: 5 (ref 5–15)
BUN: 26 mg/dL — ABNORMAL HIGH (ref 8–23)
CO2: 27 mmol/L (ref 22–32)
Calcium: 8.5 mg/dL — ABNORMAL LOW (ref 8.9–10.3)
Chloride: 107 mmol/L (ref 98–111)
Creatinine, Ser: 0.53 mg/dL (ref 0.44–1.00)
GFR, Estimated: >60 mL/min (ref >60)
Glucose, Bld: 113 mg/dL — ABNORMAL HIGH (ref 70–99)
Potassium: 3.4 mmol/L — ABNORMAL LOW (ref 3.5–5.1)
Sodium: 139 mmol/L (ref 135–145)

## 2020-10-11 LAB — MAGNESIUM: Magnesium: 1.9 mg/dL (ref 1.7–2.4)

## 2020-10-11 LAB — GLUCOSE, CAPILLARY
Glucose-Capillary: 99 mg/dL (ref 70–99)
Glucose-Capillary: 110 mg/dL — ABNORMAL HIGH (ref 70–99)
Glucose-Capillary: 99 mg/dL (ref 70–99)

## 2020-10-11 MED ORDER — ONDANSETRON HCL 4 MG/2ML IJ SOLN
4.0000 mg | Freq: Four times a day (QID) | INTRAMUSCULAR | Status: DC | PRN
  Administered 2020-10-11 – 2020-10-12 (×3): 4 mg via INTRAVENOUS
  Filled 2020-10-11 (×3): qty 2

## 2020-10-11 MED ORDER — SENNA 8.6 MG PO TABS
1.0000 | ORAL_TABLET | Freq: Every day | ORAL | Status: DC
  Administered 2020-10-11 – 2020-10-13 (×3): 8.6 mg via ORAL
  Filled 2020-10-11 (×3): qty 1

## 2020-10-11 MED ORDER — POLYETHYLENE GLYCOL 3350 17 G PO PACK
17.0000 g | PACK | Freq: Every day | ORAL | Status: DC
  Administered 2020-10-11 – 2020-10-13 (×3): 17 g via ORAL
  Filled 2020-10-11 (×3): qty 1

## 2020-10-11 MED ORDER — POTASSIUM CHLORIDE CRYS ER 20 MEQ PO TBCR
40.0000 meq | EXTENDED_RELEASE_TABLET | Freq: Once | ORAL | Status: AC
  Administered 2020-10-11: 40 meq via ORAL
  Filled 2020-10-11: qty 2

## 2020-10-11 MED ORDER — ONDANSETRON HCL 4 MG PO TABS: 4.0000 mg | ORAL_TABLET | Freq: Four times a day (QID) | ORAL | Status: DC | PRN

## 2020-10-11 MED ORDER — ROPINIROLE HCL 1 MG PO TABS
2.0000 mg | ORAL_TABLET | Freq: Every day | ORAL | Status: DC
  Administered 2020-10-11 – 2020-10-12 (×2): 2 mg via ORAL
  Filled 2020-10-11 (×2): qty 2

## 2020-10-11 NOTE — Evaluation (Signed)
Occupational Therapy Evaluation Patient Details Name: Theresa Mills MRN: [id number] DOB: [birth year] Today's Date: 10/11/2020   History of Present Illness Theresa Mills is a 70 y.o. female with medical history significant for Parkinson's disease, diabetes mellitus, hypothyroidism, hypertension, GERD who presents to the ER for evaluation of back pain which she has had for several days per patient.   Clinical Impression   Theresa Mills presents with 8-10/10 pain, reduced strength and endurance, and greatly reduced ability to engage in fxl mobility tasks. She reports living alone, with occasional help (usually 1 day/week) for IADL, but states that she completes all ADL tasks herself. Today, however, she is far from this baseline level of performance, requiring Max A for bed mobility and all ADL besides self-feeding. Recommend ongoing OT while hospitalized, followed by DC to SNF to allow pt to improve strength and endurance and manage pain, before she can safely return [facility] at [facility].     Recommendations for follow up therapy are one component of a multi-disciplinary discharge planning process, led by the attending physician.  Recommendations may be updated based on patient status, additional functional criteria and insurance authorization.   Follow Up Recommendations  SNF    Equipment Recommendations  None recommended by OT    Recommendations for Other Services       Precautions / Restrictions Precautions Precautions: Fall Required Braces or Orthoses: Spinal Brace Spinal Brace: Thoracolumbosacral orthotic Restrictions Weight Bearing Restrictions: No      Mobility Bed Mobility Overal bed mobility: Needs Assistance Bed Mobility: Supine to Sit;Sit to Supine     Supine to sit: Max assist Sit to supine: Mod assist        Transfers                 General transfer comment: unable    Balance Overall balance assessment: Needs assistance Sitting-balance support: Bilateral  upper extremity supported;Feet supported Sitting balance-Leahy Scale: Poor       Standing balance-Leahy Scale: Zero                             ADL either performed or assessed with clinical judgement   ADL Overall ADL's : Needs assistance/impaired Eating/Feeding: Modified independent Eating/Feeding Details (indicate cue type and reason): able to self-feed, slowly and with spillage                                   General ADL Comments: Requires Mod-MaxA for all ADLs besides self-feeding     Vision         Perception     Praxis      Pertinent Vitals/Pain Pain Assessment: 0-10 Pain Score: 8  Pain Location: back Pain Intervention(s): Limited activity within patient's tolerance;Repositioned;Premedicated before session;Monitored during session     Hand Dominance     Extremity/Trunk Assessment Upper Extremity Assessment Upper Extremity Assessment: Overall WFL for tasks assessed   Lower Extremity Assessment Lower Extremity Assessment: Overall WFL for tasks assessed       Communication Communication Communication: No difficulties   Cognition Arousal/Alertness: Awake/alert Behavior During Therapy: WFL for tasks assessed/performed Overall Cognitive Status: Within Functional Limits for tasks assessed                                     General Comments  Exercises Other Exercises Other Exercises: Bed mobility, bed-level therex, dressing, grooming, feeding; Educ re: role of OT, POC, DC recs   Shoulder Instructions      Home Living Family/patient expects to be discharged to:: Private residence Living Arrangements: Alone Available Help at Discharge: Available PRN/intermittently;Personal care attendant Type of Home: House Home Access: Level entry     Home Layout: One level     Bathroom Shower/Tub: Occupational psychologist: Handicapped height     Home Equipment: Environmental consultant - 4 wheels;Bedside commode;Shower  seat;Hand held shower head;Adaptive equipment Adaptive Equipment: Reacher        Prior Functioning/Environment Level of Independence: Needs assistance  Gait / Transfers Assistance Needed: Uses 2WW, 4WW, or WC ADL's / Homemaking Assistance Needed: Reports manages her own ADL. Has assistance for driving, shopping, cooking, medications            OT Problem List: Decreased strength;Impaired balance (sitting and/or standing);Pain;Decreased range of motion;Decreased activity tolerance;Decreased coordination      OT Treatment/Interventions: Self-care/ADL training;DME and/or AE instruction;Therapeutic activities;Balance training;Therapeutic exercise;Energy conservation;Patient/family education    OT Goals(Current goals can be found in the care plan section) Acute Rehab OT Goals Patient Stated Goal: reduced pain OT Goal Formulation: With patient Time For Goal Achievement: 10/25/20 Potential to Achieve Goals: Good ADL Goals Pt Will Perform Upper Body Bathing: sitting;with supervision Pt Will Perform Lower Body Dressing: sitting/lateral leans;with min guard assist (using AE as needed) Pt Will Transfer to Toilet: with supervision;stand pivot transfer (using LRAD) Pt Will Perform Tub/Shower Transfer: shower seat;Stand pivot transfer;with supervision (using LRAD)  OT Frequency: Min 1X/week   Barriers to D/C: Decreased caregiver support  pt lives alone, has infrequent home health attendant       Co-evaluation              AM-PAC OT "6 Clicks" Daily Activity     Outcome Measure Help from another person eating meals?: None Help from another person taking care of personal grooming?: A Little Help from another person toileting, which includes using toliet, bedpan, or urinal?: A Lot Help from another person bathing (including washing, rinsing, drying)?: A Lot Help from another person to put on and taking off regular upper body clothing?: A Lot Help from another person to put on and  taking off regular lower body clothing?: A Lot 6 Click Score: 15   End of Session Equipment Utilized During Treatment: Rolling walker  Activity Tolerance: Patient limited by pain Patient left: in bed;with bed alarm set;with call bell/phone within reach  OT Visit Diagnosis: Unsteadiness on feet (R26.81);Muscle weakness (generalized) (M62.81);Pain;Other symptoms and signs involving the nervous system (R29.898)                Time: 0349-1791 OT Time Calculation (min): 39 min Charges:  OT General Charges $OT Visit: 1 Visit OT Evaluation $OT Eval Low Complexity: 1 Low OT Treatments $Self Care/Home Management : 23-37 mins $Therapeutic Activity: 8-22 mins Josiah Lobo, PhD, MS, OTR/L 10/11/20, 1:46 PM

## 2020-10-11 NOTE — TOC Progression Note (Signed)
Transition of Care [facility]) - Progression Note    Patient Details  Name: Theresa Mills MRN: [id number] Date of Birth: 12-07-1950  [facility] CM/SW [place], RN Phone Number: 10/11/2020, 3:58 PM  Clinical Narrative:    Met with the patient at the bedside, the patient is agreeable to [facility] SNF she has had 4 covid vaccines, Will review the bed offers once obtained        Expected Discharge Plan and Services                                                 Social Determinants of Health (SDOH) Interventions    Readmission Risk Interventions Readmission Risk Prevention Plan 08/29/2019  Transportation Screening Complete  PCP or Specialist Appt within 3-5 Days Complete  HRI or [place] Complete  Social Work Consult for [place] Planning/Counseling Complete  Palliative Care Screening Not Applicable  Medication Review Press photographer) Complete  Some recent data might be hidden

## 2020-10-11 NOTE — NC FL2 (Signed)
[place] LEVEL OF CARE SCREENING TOOL     IDENTIFICATION  Patient Name: Theresa Mills Birthdate: 08-Aug-1950 Sex: female Admission Date (Current Location): 10/10/2020  [facility] and [place] Number:  Engineering geologist and Address:  [facility], [address] [place], [place] [address]      Provider Number: [id number]  Attending Physician Name and Address:  Gwynne Edinger, MD  Relative Name and Phone Number:  Daryll Drown [phone]    Current Level of Care: Hospital Recommended Level of Care: [place] Prior Approval Number:    Date Approved/Denied:   PASRR Number: [id number]  Discharge Plan: SNF    Current Diagnoses: Patient Active Problem List   Diagnosis Date Noted   Compression fracture of lumbar vertebra ([place]) 10/10/2020   Major depression in remission ([place]) 09/23/2020   Hav (hallux abducto valgus), unspecified laterality 09/23/2020   Back pain 09/30/2019   Epidural abscess 09/30/2019   Psoas abscess, right ([place]) 09/30/2019   High anion gap metabolic acidosis 53/66/4403   Lumbar compression fracture ([place]) 08/25/2019   Osteomyelitis of lumbar vertebra ([place]) 08/25/2019   Discitis 08/25/2019   Malnutrition of moderate degree 08/20/2019   Severe sepsis ([place]) 08/19/2019   Bilateral hydronephrosis 08/19/2019   Acute pyelonephritis 47/42/5956   Acute metabolic encephalopathy 38/75/6433   Acute urinary retention 08/19/2019   AKI (acute kidney injury) ([place]) 08/19/2019   Osteoarthritis of multiple joints    Frequent falls 08/05/2019   Urinary tract infection with hematuria 06/11/2019   Nausea and vomiting 02/13/2019   Acquired hypothyroidism 02/13/2019   Acquired bilateral hammer toes 01/23/2019   Type 2 diabetes mellitus with hyperglycemia ([place]) 09/15/2018   Encounter for general adult medical examination with abnormal findings 09/05/2017   Dysuria 09/05/2017   Uncontrolled type 2 diabetes mellitus  with hyperglycemia ([place]) 06/18/2017   Rib fractures 06/11/2017   Pressure injury of coccygeal region, stage 2 ([place]) 06/11/2017   Rhabdomyolysis 06/10/2017   Elevated troponin 06/10/2017   Hypernatremia 06/10/2017   Fall 06/10/2017   Arthritis 01/08/2017   Cataracts, bilateral 01/08/2017   Chickenpox 01/08/2017   Shingles 01/08/2017   Depression 01/08/2017   Diabetes mellitus type 2, uncomplicated ([place]) 29/51/8841   Hyperlipidemia, unspecified 01/08/2017   Essential hypertension 01/08/2017   Migraine headache 01/08/2017   Thyroid disease 01/08/2017   Parkinson disease ([place]) 01/08/2017   Anxiety, generalized 09/01/2016   Dementia due to Parkinson's disease without behavioral disturbance ([place]) 09/01/2016   History of depression 09/01/2016   Morbid obesity with BMI of 40.0-44.9, adult ([place]) 09/01/2016   Restless leg syndrome 09/01/2016   Class 2 severe obesity with serious comorbidity in adult ([place]) 09/01/2016    Orientation RESPIRATION BLADDER Height & Weight     Self, Time, Situation, Place  Normal External catheter, Incontinent Weight:   Height:     BEHAVIORAL SYMPTOMS/MOOD NEUROLOGICAL BOWEL NUTRITION STATUS      Continent Diet (Carb Modified)  AMBULATORY STATUS COMMUNICATION OF NEEDS Skin   Extensive Assist Verbally Normal                       Personal Care Assistance Level of Assistance  Bathing, Dressing Bathing Assistance: Limited assistance   Dressing Assistance: Limited assistance     Functional Limitations Info             SPECIAL CARE FACTORS FREQUENCY  PT (By licensed PT), OT (By licensed OT)     PT Frequency: 5 times per week  OT Frequency: 5 times per week            Contractures Contractures Info: Not present    Additional Factors Info  Code Status, Allergies Code Status Info: DNR Allergies Info: Codeine, Ephadrene (Cholestatin), Other, Valium (Diazepam), Ephedrine           Current Medications (10/11/2020):  This is the current  hospital active medication list Current Facility-Administered Medications  Medication Dose Route Frequency Provider Last Rate Last Admin   0.9 %  sodium chloride infusion  250 mL Intravenous PRN Agbata, Tochukwu, MD       calcium-vitamin D (OSCAL WITH D) 500-200 MG-UNIT per tablet 1 tablet  1 tablet Oral Daily Agbata, Tochukwu, MD   1 tablet at 10/11/20 0943   carbidopa-levodopa (SINEMET IR) 25-100 MG per tablet immediate release 2 tablet  2 tablet Oral QID Agbata, Tochukwu, MD   2 tablet at 10/11/20 1258   celecoxib (CELEBREX) capsule 200 mg  200 mg Oral BID Agbata, Tochukwu, MD   200 mg at 10/11/20 0944   cyclobenzaprine (FLEXERIL) tablet 5 mg  5 mg Oral TID PRN Agbata, Tochukwu, MD       docusate sodium (COLACE) capsule 100 mg  100 mg Oral BID Agbata, Tochukwu, MD   100 mg at 10/11/20 0943   enoxaparin (LOVENOX) injection 40 mg  40 mg Subcutaneous Q24H Agbata, Tochukwu, MD   40 mg at 10/10/20 2040   HYDROcodone-acetaminophen (NORCO/VICODIN) 5-325 MG per tablet 1 tablet  1 tablet Oral Q6H PRN Agbata, Tochukwu, MD   1 tablet at 10/11/20 0451   levothyroxine (SYNTHROID) tablet 100 mcg  100 mcg Oral QAC breakfast Agbata, Tochukwu, MD   100 mcg at 10/11/20 3267   melatonin tablet 2.5 mg  2.5 mg Oral QHS PRN Agbata, Tochukwu, MD       multivitamin with minerals tablet 1 tablet  1 tablet Oral Daily Agbata, Tochukwu, MD   1 tablet at 10/11/20 0944   ondansetron (ZOFRAN) tablet 4 mg  4 mg Oral Q6H PRN Athena Masse, MD       Or   ondansetron [facility]) injection 4 mg  4 mg Intravenous Q6H PRN Athena Masse, MD       pantoprazole (PROTONIX) EC tablet 40 mg  40 mg Oral Daily Agbata, Tochukwu, MD   40 mg at 10/11/20 0944   polyethylene glycol (MIRALAX / GLYCOLAX) packet 17 g  17 g Oral Daily Gwynne Edinger, MD   17 g at 10/11/20 0944   rOPINIRole (REQUIP) tablet 2 mg  2 mg Oral QHS Wouk, Ailene Rud, MD       senna [facility]) tablet 8.6 mg  1 tablet Oral Daily Gwynne Edinger, MD   8.6 mg at  10/11/20 0944   sertraline (ZOLOFT) tablet 100 mg  100 mg Oral Daily Agbata, Tochukwu, MD   100 mg at 10/11/20 0944   simvastatin (ZOCOR) tablet 20 mg  20 mg Oral q1800 Agbata, Tochukwu, MD       sodium chloride flush (NS) 0.9 % injection 3 mL  3 mL Intravenous Q12H Agbata, Tochukwu, MD   3 mL at 10/11/20 0945   sodium chloride flush (NS) 0.9 % injection 3 mL  3 mL Intravenous PRN Agbata, Tochukwu, MD         Discharge Medications: Please see discharge summary for a list of discharge medications.  Relevant Imaging Results:  Relevant Lab Results:   Additional Information SSN [id number]  Su Hilt, RN

## 2020-10-11 NOTE — Progress Notes (Addendum)
PROGRESS NOTE    Theresa Mills  VOH:[id number] DOB: 24-Nov-1950 DOA: 10/10/2020 PCP: Lavera Guise, MD  Outpatient Specialists: neurology    Brief Narrative:   Theresa Mills is a 70 y.o. female with medical history significant for Parkinson's disease, diabetes mellitus, hypothyroidism, hypertension, GERD who presents to the ER for evaluation of back pain which she has had for several days per patient. She states that she had leaned over to make her bed and thinks that she twisted her back and has had severe pain in her lower back for several days.  She denies any falls.  She rates her pain an 8 x 10 in intensity at its worst and states that pain is exacerbated by any form of movement.  She was seen in the ER 2 days ago for same and was discharged home.  She has not been able to ambulate with her walker due to the severity of the pain in her back. She is able to move her lower extremities and denies having any fecal incontinence but states that she has urinary incontinence.  She is unable to tell me if the urinary incontinence is new or worse in the last couple of days. She denies having any chest pain, no shortness of breath, no abdominal pain, no nausea, no vomiting, no dizziness, no lightheadedness, no headache, no blurred vision, no focal deficits, no urinary frequency, no nocturia, no dysuria, no palpitations or diaphoresis.   Assessment & Plan:   Principal Problem:   Compression fracture of lumbar vertebra (HCC) Active Problems:   Depression   Diabetes mellitus type 2, uncomplicated (HCC)   Essential hypertension   Parkinson disease (HCC)   Acquired hypothyroidism   Back pain   # L1 compression fracture No signs cord compression. Hx l3/4 osteomyelitis/discitis, no signs that on CT. Neurosurgery has evaluated, attempting non-interventional approach. - neurosurg following - tlso brace in place - pt/ot consults - hydrocodone for pain; miralax senna ordered - likely SNF -  given brace can't remain well upright for starting bisphosphonate now but will need attention to this when can reliably stay upright to swall  # Parkinson's # Restless leg - home sinemet, requip  # MDD - home zoloft   # T2DM Here glucose wnl - hold home met - fasting sugars daily  # Hypothyroid - home synthroid   DVT prophylaxis: lovenox Code Status: DNR Family Communication: sister gail updated telephonically 9/19  Level of care: Med-Surg Status is: Inpatient  Remains inpatient appropriate because:Inpatient level of care appropriate due to severity of illness  Dispo: The patient is from: Home              Anticipated d/c is to: tbd, likely snf              Patient currently is not medically stable to d/c.   Difficult to place patient No        Consultants:  neurosurgery  Procedures: none  Antimicrobials:  none    Subjective: This morning pain controlled, no lower extremity numbness or loss of bowel/bladder  Objective: Vitals:   10/10/20 1900 10/10/20 2104 10/11/20 0455 10/11/20 0718  BP: 120/76 (!) 149/83 (!) 117/58 129/71  Pulse: 67 68 60 62  Resp: _0 Temp: 98 F (36.7 C) 97.7 F (36.5 C) 98.8 F (37.1 C) 98 F (36.7 C)  TempSrc: Oral  Oral   SpO2: 100% 100% 100% 100%   No intake or output data in the 24  hours ending 10/11/20 0853 There were no vitals filed for this visit.  Examination:  General exam: Appears calm and comfortable  Respiratory system: Clear to auscultation. Respiratory effort normal. Cardiovascular system: S1 & S2 heard, RRR. No JVD, murmurs, rubs, gallops or clicks. No pedal edema. Gastrointestinal system: Abdomen is nondistended, soft and nontender. No organomegaly or masses felt. Normal bowel sounds heard. Central nervous system: Alert and oriented. Le strength 5/5, distal sensation intact. Brace in place Extremities: warm, no edema Skin: No rashes, lesions or ulcers Psychiatry: Judgement and insight appear  normal. Mood & affect appropriate.     Data Reviewed: I have personally reviewed following labs and imaging studies  CBC: Recent Labs  Lab 10/08/20 1354 10/10/20 0530 10/11/20 0608  WBC 7.9 8.5 7.4  NEUTROABS 5.0 5.6  --   HGB 14.7 14.8 13.5  HCT 44.7 43.5 40.0  MCV 98.0 97.8 98.0  PLT 194 201 170   Basic Metabolic Panel: Recent Labs  Lab 10/08/20 1354 10/10/20 0530 10/11/20 0608  NA 140 141 139  K 3.6 3.5 3.4*  CL 105 105 107  CO2 _0 GLUCOSE 88 103* 113*  BUN 24* 36* 26*  CREATININE 0.51 0.65 0.53  CALCIUM 9.4 9.3 8.5*   GFR: Estimated Creatinine Clearance: 65.2 mL/min (by C-G formula based on SCr of 0.53 mg/dL). Liver Function Tests: Recent Labs  Lab 10/10/20 0530  AST 37  ALT 10  ALKPHOS 81  BILITOT 1.2  PROT 7.2  ALBUMIN 4.2   No results for input(s): LIPASE, AMYLASE in the last 168 hours. No results for input(s): AMMONIA in the last 168 hours. Coagulation Profile: No results for input(s): INR, PROTIME in the last 168 hours. Cardiac Enzymes: No results for input(s): CKTOTAL, CKMB, CKMBINDEX, TROPONINI in the last 168 hours. BNP (last 3 results) No results for input(s): PROBNP in the last 8760 hours. HbA1C: Recent Labs    10/10/20 0530  HGBA1C 5.1   CBG: Recent Labs  Lab 10/10/20 1109 10/10/20 1903 10/11/20 0720  GLUCAP 90 83 99   Lipid Profile: No results for input(s): CHOL, HDL, LDLCALC, TRIG, CHOLHDL, LDLDIRECT in the last 72 hours. Thyroid Function Tests: No results for input(s): TSH, T4TOTAL, FREET4, T3FREE, THYROIDAB in the last 72 hours. Anemia Panel: No results for input(s): VITAMINB12, FOLATE, FERRITIN, TIBC, IRON, RETICCTPCT in the last 72 hours. Urine analysis:    Component Value Date/Time   COLORURINE YELLOW 10/10/2020 0531   APPEARANCEUR CLEAR 10/10/2020 0531   APPEARANCEUR Cloudy (A) 09/05/2017 1535   LABSPEC 1.015 10/10/2020 0531   LABSPEC 1.021 01/13/2014 0737   PHURINE 7.0 10/10/2020 0531   GLUCOSEU  NEGATIVE 10/10/2020 0531   GLUCOSEU >=500 01/13/2014 0737   HGBUR TRACE (A) 10/10/2020 0531   BILIRUBINUR NEGATIVE 10/10/2020 0531   BILIRUBINUR negative 05/12/2020 0838   BILIRUBINUR Negative 09/05/2017 1535   BILIRUBINUR Negative 01/13/2014 0737   KETONESUR 15 (A) 10/10/2020 0531   PROTEINUR NEGATIVE 10/10/2020 0531   UROBILINOGEN 0.2 05/12/2020 0838   NITRITE NEGATIVE 10/10/2020 0531   LEUKOCYTESUR TRACE (A) 10/10/2020 0531   LEUKOCYTESUR 3+ 01/13/2014 0737   Sepsis Labs: _1 (procalcitonin:4,lacticidven:4)  ) Recent Results (from the past 240 hour(s))  Urine Culture     Status: Abnormal   Collection Time: 10/08/20  1:41 PM   Specimen: Urine, Clean Catch  Result Value Ref Range Status   Specimen Description   Final    URINE, CLEAN CATCH Performed at [facility], [address], [place], [place] [address]  Special Requests   Final    NONE Performed at [facility], [address] [place], [place] [address]    Culture (A)  Final    >=100,000 COLONIES/mL AEROCOCCUS SPECIES Standardized susceptibility testing for this organism is not available. Performed at [facility], [place] [address], [place], [place] [address]    Report Status 10/10/2020 FINAL  Final  Resp Panel by RT-PCR (Flu A&B, Covid) Nasopharyngeal Swab     Status: None   Collection Time: 10/10/20  5:30 AM   Specimen: Nasopharyngeal Swab; Nasopharyngeal(NP) swabs in vial transport medium  Result Value Ref Range Status   SARS Coronavirus 2 by RT PCR NEGATIVE NEGATIVE Final    Comment: (NOTE) SARS-CoV-2 target nucleic acids are NOT DETECTED.  The SARS-CoV-2 RNA is generally detectable in upper respiratory specimens during the acute phase of infection. The lowest concentration of SARS-CoV-2 viral copies this assay can detect is 138 copies/mL. A negative result does not preclude SARS-Cov-2 infection and should not be used as the sole basis for treatment or other patient  management decisions. A negative result may occur with  improper specimen collection/handling, submission of specimen other than nasopharyngeal swab, presence of viral mutation(s) within the areas targeted by this assay, and inadequate number of viral copies(<138 copies/mL). A negative result must be combined with clinical observations, patient history, and epidemiological information. The expected result is Negative.  Fact Sheet for Patients:  EntrepreneurPulse.com.au  Fact Sheet for Healthcare Providers:  IncredibleEmployment.be  This test is no t yet approved or cleared by the [place] FDA and  has been authorized for detection and/or diagnosis of SARS-CoV-2 by FDA under an Emergency Use Authorization (EUA). This EUA will remain  in effect (meaning this test can be used) for the duration of the COVID-19 declaration under Section 564(b)(1) of the Act, 21 U.S.C.section 360bbb-3(b)(1), unless the authorization is terminated  or revoked sooner.       Influenza A by PCR NEGATIVE NEGATIVE Final   Influenza B by PCR NEGATIVE NEGATIVE Final    Comment: (NOTE) The Xpert Xpress SARS-CoV-2/FLU/RSV plus assay is intended as an aid in the diagnosis of influenza from Nasopharyngeal swab specimens and should not be used as a sole basis for treatment. Nasal washings and aspirates are unacceptable for Xpert Xpress SARS-CoV-2/FLU/RSV testing.  Fact Sheet for Patients: EntrepreneurPulse.com.au  Fact Sheet for Healthcare Providers: IncredibleEmployment.be  This test is not yet approved or cleared by the [place] FDA and has been authorized for detection and/or diagnosis of SARS-CoV-2 by FDA under an Emergency Use Authorization (EUA). This EUA will remain in effect (meaning this test can be used) for the duration of the COVID-19 declaration under Section 564(b)(1) of the Act, 21 U.S.C. section 360bbb-3(b)(1),  unless the authorization is terminated or revoked.  Performed at [facility], [address] [place], [place] [address]          Radiology Studies: CT HEAD WO CONTRAST (5MM)  Result Date: 10/10/2020 CLINICAL DATA:  Mental status changes. EXAM: CT HEAD WITHOUT CONTRAST TECHNIQUE: Contiguous axial images were obtained from the base of the skull through the vertex without intravenous contrast. COMPARISON:  08/19/2019 FINDINGS: Brain: There is no evidence for acute hemorrhage, hydrocephalus, mass lesion, or abnormal extra-axial fluid collection. No definite CT evidence for acute infarction. Diffuse loss of parenchymal volume is consistent with atrophy. Patchy low attenuation in the deep hemispheric and periventricular white matter is nonspecific, but likely reflects chronic microvascular ischemic demyelination. Vascular: No hyperdense vessel  or unexpected calcification. Skull: No evidence for fracture. No worrisome lytic or sclerotic lesion. Sinuses/Orbits: The visualized paranasal sinuses and mastoid air cells are clear. Visualized portions of the globes and intraorbital fat are unremarkable. Other: None. IMPRESSION: 1. No acute intracranial abnormality. 2. Atrophy with chronic small vessel white matter ischemic disease. Electronically Signed   By: Misty Stanley M.D.   On: 10/10/2020 07:11   CT ABDOMEN PELVIS W CONTRAST  Result Date: 10/10/2020 CLINICAL DATA:  Abdominal pain. EXAM: CT ABDOMEN AND PELVIS WITH CONTRAST TECHNIQUE: Multidetector CT imaging of the abdomen and pelvis was performed using the standard protocol following bolus administration of intravenous contrast. CONTRAST:  8m OMNIPAQUE IOHEXOL 350 MG/ML SOLN COMPARISON:  09/02/2019 FINDINGS: Lower chest: Unremarkable. Hepatobiliary: No suspicious focal abnormality within the liver parenchyma. Tiny hypodensities in the lateral right liver are stable in the interval, compatible with tiny cysts. Gallbladder surgically absent.  No intrahepatic or extrahepatic biliary dilation. Pancreas: No focal mass lesion. No dilatation of the main duct. No intraparenchymal cyst. No peripancreatic edema. Spleen: No splenomegaly. No focal mass lesion. Adrenals/Urinary Tract: No adrenal nodule or mass. Several small nonobstructing stones in the right kidney measure up to 5 mm. Several nonobstructing stones also noted left kidney measuring up to about 6 mm. Stable small interpolar left renal cyst. Despite the lack of secondary changes in the left kidney and ureter, a 3 x 3 x 3 mm stone is identified in the distal left ureter just proximal to the UVJ (image 74/2). No bladder stones. Stomach/Bowel: Stomach is unremarkable. No gastric wall thickening. No evidence of outlet obstruction. Duodenum is normally positioned as is the ligament of Treitz. No small bowel wall thickening. No small bowel dilatation. Terminal ileum and appendix not clearly visualized due to motion artifact. No gross colonic mass. No colonic wall thickening. Vascular/Lymphatic: There is moderate atherosclerotic calcification of the abdominal aorta without aneurysm. There is no gastrohepatic or hepatoduodenal ligament lymphadenopathy. No retroperitoneal or mesenteric lymphadenopathy. No pelvic sidewall lymphadenopathy. Reproductive: Uterus surgically absent.  There is no adnexal mass. Other: No intraperitoneal free fluid. Musculoskeletal: Small umbilical hernia contains only fat. Degenerative changes are noted in the hips, right greater than left interval further loss of vertebral body height at the L4 compression fracture. Advanced degenerative disc disease noted in the lumbar spine. There is a new oblique fracture through the inferior L1 vertebral body that appears acute (sagittal 97/6). Convex leftward thoracolumbar scoliosis again noted. IMPRESSION: 1. 3 x 3 x 3 mm distal left ureteral stone without appreciable secondary changes in the left kidney or ureter. 2. Bilateral nonobstructing  nephrolithiasis. 3. Acute oblique fracture through the inferior L1 vertebral body without substantial loss of vertebral body height. 4. Further compression deformity at the L4 compression fracture seen previously. 5. Aortic Atherosclerosis (ICD10-I70.0). Electronically Signed   By: EMisty StanleyM.D.   On: 10/10/2020 07:20   CT L-SPINE NO CHARGE  Result Date: 10/10/2020 CLINICAL DATA:  Not acting normal.  Back pain EXAM: CT Lumbar spine with contrast TECHNIQUE: Multiplanar CT images of the lumbar spine were reconstructed from contemporary CT of the abdomen and Pelvis CONTRAST:  None additional COMPARISON:  CT from 2 days ago FINDINGS: CT LUMBAR SPINE FINDINGS Segmentation: 5 lumbar type vertebrae Alignment: Scoliosis. Grade 2 anterolisthesis at L5-S1 due to chronic L5 pars defects. Posttraumatic retrolisthesis at L3-4. Vertebrae: Oblique fracture through the body of L1, reaching the inferior endplate, nondisplaced. Chronic superior endplate fracture and depression with retropulsion at L4, non progressed. Remote L3-4 discitis/osteomyelitis  with endplate destruction and sclerosis. Paraspinal and other soft tissues: No evidence of perispinal mass or inflammation Disc levels: T12- L1: Bridging osteophyte L1-L2: Bulky endplate osteophytosis eccentric to the right. Facet spurring and moderate foraminal narrowing L2-L3: Disc collapse and eccentric right bulky spurring. Bilateral facet spurring and foraminal impingement. L3-L4: Postinfectious disc and endplate irregularity. Facet spurring asymmetric to the left. Advanced left foraminal impingement. High-grade spinal stenosis L4-L5: Facet spurring asymmetric to the left. L5-S1:Chronic L5 pars defects with anterolisthesis and disc collapse. Asymmetric left foraminal impingement. Patulous canal. IMPRESSION: 1. Nondisplaced acute L1 body fracture. 2. Sequela of L3-4 discitis osteomyelitis, advanced degenerative disease, L5 chronic pars defects, and scoliosis/listhesis. 3.  Multilevel foraminal narrowing with marked left foraminal impingement at L3-4 and L5-S1. 4. Advanced spinal stenosis at L3-4. Electronically Signed   By: Jorje Guild M.D.   On: 10/10/2020 07:12   DG Chest Portable 1 View  Result Date: 10/10/2020 CLINICAL DATA:  Shortness of breath EXAM: PORTABLE CHEST 1 VIEW COMPARISON:  08/19/2019 FINDINGS: Normal heart size and mediastinal contours. There is no edema, consolidation, effusion, or pneumothorax. No acute osseous finding. IMPRESSION: No evidence of active disease. Electronically Signed   By: Jorje Guild M.D.   On: 10/10/2020 04:57        Scheduled Meds:  ALPRAZolam  0.5 mg Oral Daily   calcium-vitamin D  1 tablet Oral Daily   carbidopa-levodopa  2 tablet Oral QID   celecoxib  200 mg Oral BID   docusate sodium  100 mg Oral BID   enoxaparin (LOVENOX) injection  40 mg Subcutaneous Q24H   levothyroxine  100 mcg Oral QAC breakfast   multivitamin with minerals  1 tablet Oral Daily   pantoprazole  40 mg Oral Daily   rOPINIRole  2 mg Oral QHS   sertraline  100 mg Oral Daily   simvastatin  20 mg Oral q1800   sodium chloride flush  3 mL Intravenous Q12H   Continuous Infusions:  sodium chloride       LOS: 1 day    Time spent: 35 min    Desma Maxim, MD [facility]   If 7PM-7AM, please contact night-coverage [email or web address] Password [facility] 10/11/2020, 8:53 AM

## 2020-10-11 NOTE — Evaluation (Signed)
Physical Therapy Evaluation Patient Details Name: Theresa Mills MRN: [id number] DOB: [birth year] Today's Date: 10/11/2020  History of Present Illness  Pt is 70 y/o F admitted on 10/10/20 after presenting to the ED with c/o back pain x several days. Pt currently being treated conservatively with TLSO before considering operative fixation.  PMH: PD, DM, hypothyroidism, HTN, GERD  Clinical Impression  Pt seen for PT evaluation with pt agreeable, denying pain at rest but with behaviors demonstrating significant pain with movement. PT educates pt on back precautions with pt recalling them at end of session. Pt requires MAX assist to attempt bed mobility but is unable to sit at EOB despite max assist 2/2 pain & L lateral lean. PT doffs brace at end of session as it was not positioned properly. Pt performs BLE exercises with AAROM. Pt demonstrates significant BLE weakness & unable to lift BLE against gravity while supine. Pt would benefit from STR upon d/c to maximize independence with functional mobility & reduce fall risk prior to return home. Pt is unsafe to d/c home alone at this time.         Recommendations for follow up therapy are one component of a multi-disciplinary discharge planning process, led by the attending physician.  Recommendations may be updated based on patient status, additional functional criteria and insurance authorization.  Follow Up Recommendations SNF;Supervision/Assistance - 24 hour    Equipment Recommendations  None recommended by PT    Recommendations for Other Services       Precautions / Restrictions Precautions Precautions: Fall;Back Required Braces or Orthoses: Spinal Brace Spinal Brace: Thoracolumbosacral orthotic Restrictions Weight Bearing Restrictions: No      Mobility  Bed Mobility Overal bed mobility: Needs Assistance Bed Mobility: Sidelying to Sit;Rolling;Sit to Sidelying Rolling: Max assist Sidelying to sit: Max assist Supine to sit: Max  assist Sit to supine: Mod assist Sit to sidelying: Max assist General bed mobility comments: max assist to attempt bed mobility, cuing to look at bed rails & maintain hand hold on rail    Transfers                 General transfer comment: unable  Ambulation/Gait                Stairs            Wheelchair Mobility    Modified Rankin (Stroke Patients Only)       Balance Overall balance assessment: Needs assistance Sitting-balance support: Bilateral upper extremity supported;Feet supported Sitting balance-Leahy Scale: Zero Sitting balance - Comments: unable to achieve midline orientation Postural control: Left lateral lean   Standing balance-Leahy Scale: Zero                               Pertinent Vitals/Pain Pain Assessment: Faces Pain Score: 8  Faces Pain Scale: Hurts worst Pain Location: back during bed mobility & when PT touched BLE Pain Descriptors / Indicators: Grimacing;Discomfort Pain Intervention(s): Limited activity within patient's tolerance;Repositioned;Monitored during session    Home Living Family/patient expects to be discharged to:: Private residence Living Arrangements: Alone Available Help at Discharge: Available PRN/intermittently;Personal care attendant Type of Home: House Home Access: Level entry     Home Layout: One level Home Equipment: Shower seat;Hand held shower head;Adaptive equipment;Walker - 2 wheels;Bedside commode      Prior Function Level of Independence: Needs assistance   Gait / Transfers Assistance Needed: Pt reports she ambulates with RW.  ADL's / Homemaking Assistance Needed: Reports manages her own ADL. Has assistance for driving, shopping, cooking, medications  Comments: Pt reports she has a personal care aide that assists with grocery/clothing shopping & cleaning. Pt doesn't drive (hasn't been getting to appointments lately).     Hand Dominance        Extremity/Trunk Assessment    Upper Extremity Assessment Upper Extremity Assessment: Generalized weakness (rigid extremities)    Lower Extremity Assessment Lower Extremity Assessment: Generalized weakness (rigid extremities, unable to perform SLR while supine)       Communication   Communication: No difficulties  Cognition Arousal/Alertness: Awake/alert Behavior During Therapy: WFL for tasks assessed/performed Overall Cognitive Status: Within Functional Limits for tasks assessed                                        General Comments      Exercises General Exercises - Lower Extremity Heel Slides: AAROM;Strengthening;Both;5 reps;Supine    Assessment/Plan    PT Assessment Patient needs continued PT services  PT Problem List Decreased strength;Decreased mobility;Decreased knowledge of precautions;Decreased activity tolerance;Decreased balance;Decreased range of motion;Decreased knowledge of use of DME;Pain       PT Treatment Interventions DME instruction;Therapeutic exercise;Balance training;Gait training;Stair training;Neuromuscular re-education;Functional mobility training;Therapeutic activities;Patient/family education;Modalities    PT Goals (Current goals can be found in the Care Plan section)  Acute Rehab PT Goals Patient Stated Goal: reduced pain PT Goal Formulation: With patient Time For Goal Achievement: 10/25/20 Potential to Achieve Goals: Good    Frequency 7X/week   Barriers to discharge Decreased caregiver support;Inaccessible home environment      Co-evaluation               AM-PAC PT "6 Clicks" Mobility  Outcome Measure Help needed turning from your back to your side while in a flat bed without using bedrails?: Total Help needed moving from lying on your back to sitting on the side of a flat bed without using bedrails?: Total Help needed moving to and from a bed to a chair (including a wheelchair)?: Total Help needed standing up from a chair using your arms  (e.g., wheelchair or bedside chair)?: Total Help needed to walk in hospital room?: Total Help needed climbing 3-5 steps with a railing? : Total 6 Click Score: 6    End of Session Equipment Utilized During Treatment: Back brace Activity Tolerance: Patient limited by pain Patient left: in bed;with call bell/phone within reach;with bed alarm set Nurse Communication: Mobility status PT Visit Diagnosis: Other abnormalities of gait and mobility (R26.89);Muscle weakness (generalized) (M62.81);Pain;Difficulty in walking, not elsewhere classified (R26.2) Pain - part of body:  (back)    Time: [id number] PT Time Calculation (min) (ACUTE ONLY): 18 min   Charges:   PT Evaluation $PT Eval Moderate Complexity: 1 Mod PT Treatments $Therapeutic Activity: 8-22 mins        Lavone Nian, PT, DPT 10/11/20, 3:57 PM   Waunita Schooner 10/11/2020, 3:51 PM

## 2020-10-12 ENCOUNTER — Inpatient Hospital Stay: Payer: Medicare Other

## 2020-10-12 ENCOUNTER — Ambulatory Visit: Payer: Medicare Other | Admitting: Nurse Practitioner

## 2020-10-12 DIAGNOSIS — S32010A Wedge compression fracture of first lumbar vertebra, initial encounter for closed fracture: Secondary | ICD-10-CM | POA: Diagnosis not present

## 2020-10-12 LAB — BASIC METABOLIC PANEL
Anion gap: 7 (ref 5–15)
BUN: 20 mg/dL (ref 8–23)
CO2: 29 mmol/L (ref 22–32)
Calcium: 8.6 mg/dL — ABNORMAL LOW (ref 8.9–10.3)
Chloride: 101 mmol/L (ref 98–111)
Creatinine, Ser: 0.46 mg/dL (ref 0.44–1.00)
GFR, Estimated: >60 mL/min (ref >60)
Glucose, Bld: 105 mg/dL — ABNORMAL HIGH (ref 70–99)
Potassium: 4.2 mmol/L (ref 3.5–5.1)
Sodium: 137 mmol/L (ref 135–145)

## 2020-10-12 LAB — GLUCOSE, CAPILLARY: Glucose-Capillary: 139 mg/dL — ABNORMAL HIGH (ref 70–99)

## 2020-10-12 LAB — MAGNESIUM: Magnesium: 1.7 mg/dL (ref 1.7–2.4)

## 2020-10-12 NOTE — TOC Progression Note (Signed)
Transition of Care [facility]) - Progression Note    Patient Details  Name: Theresa Mills MRN: [id number] Date of Birth: March 07, 1950  Transition of Care [facility]) CM/SW [place], RN Phone Number: 10/12/2020, 10:14 AM  Clinical Narrative:    Met with the patient in the room and reviewed the bed offers in detail along with the Medicare star rating She stated that she was a resident at [facility] for several months She requested that I ask Tiffany at [facility] to call her, I reached out to Lastrup at [facility] and requested that she call the patient. Awaiting the patient's decision         Expected Discharge Plan and Services                                                 Social Determinants of Health (SDOH) Interventions    Readmission Risk Interventions Readmission Risk Prevention Plan 08/29/2019  Transportation Screening Complete  PCP or Specialist Appt within 3-5 Days Complete  HRI or [place] Complete  Social Work Consult for [place] Planning/Counseling Complete  Palliative Care Screening Not Applicable  Medication Review Press photographer) Complete  Some recent data might be hidden

## 2020-10-12 NOTE — Progress Notes (Signed)
PROGRESS NOTE    Theresa Mills  MBT:[id number] DOB: [birth year] DOA: 10/10/2020 PCP: Lavera Guise, MD  Outpatient Specialists: neurology    Brief Narrative:   Theresa Mills is a 70 y.o. female with medical history significant for Parkinson's disease, diabetes mellitus, hypothyroidism, hypertension, GERD who presents to the ER for evaluation of back pain which she has had for several days per patient. She states that she had leaned over to make her bed and thinks that she twisted her back and has had severe pain in her lower back for several days.  She denies any falls.  She rates her pain an 8 x 10 in intensity at its worst and states that pain is exacerbated by any form of movement.  She was seen in the ER 2 days ago for same and was discharged home.  She has not been able to ambulate with her walker due to the severity of the pain in her back. She is able to move her lower extremities and denies having any fecal incontinence but states that she has urinary incontinence.  She is unable to tell me if the urinary incontinence is new or worse in the last couple of days. She denies having any chest pain, no shortness of breath, no abdominal pain, no nausea, no vomiting, no dizziness, no lightheadedness, no headache, no blurred vision, no focal deficits, no urinary frequency, no nocturia, no dysuria, no palpitations or diaphoresis.   Assessment & Plan:   Principal Problem:   Compression fracture of lumbar vertebra (HCC) Active Problems:   Depression   Diabetes mellitus type 2, uncomplicated (HCC)   Essential hypertension   Parkinson disease (HCC)   Acquired hypothyroidism   Back pain   # L1 compression fracture No signs cord compression. Hx l3/4 osteomyelitis/discitis, no signs that on CT. Neurosurgery has evaluated, attempting non-interventional approach. Pain appears controlled - neurosurg following - tlso ordered, pt says doesn't fit well, have asked nursing to contact brace tech  for re-eval - pt/ot consulted, advising snf, bed search underway - hydrocodone for pain; miralax senna ordered - bisphosphonate at some point, currently can't reliably stay upright  # Parkinson's # Restless leg - home sinemet, requip  # MDD - home zoloft   # T2DM Here glucose wnl - hold home metformin - fasting sugars daily  # Hypothyroid - home synthroid   DVT prophylaxis: lovenox Code Status: DNR Family Communication: sister gail updated telephonically 9/20  Level of care: Med-Surg Status is: Inpatient  Remains inpatient appropriate because:Inpatient level of care appropriate due to severity of illness  Dispo: The patient is from: Home              Anticipated d/c is to: smf              Patient currently is not medically stable to d/c.   Difficult to place patient No     Consultants:  neurosurgery  Procedures: none  Antimicrobials:  none    Subjective: This morning pain controlled, no lower extremity numbness or loss of bowel/bladder. Tolerating diet  Objective: Vitals:   10/11/20 2120 10/12/20 0433 10/12/20 0702 10/12/20 0804  BP: (!) 148/71 (!) 180/70 136/75 134/74  Pulse: 64 (!) 50 62 67  Resp: 16 16 18 14   Temp: 98.2 F (36.8 C) 97.9 F (36.6 C) 98 F (36.7 C) (!) 97.5 F (36.4 C)  TempSrc:   Oral   SpO2: 91% 97% 97% 97%    Intake/Output Summary (Last 24 hours)  at 10/12/2020 1310 Last data filed at 10/12/2020 1015 Gross per 24 hour  Intake 480 ml  Output 900 ml  Net -420 ml   There were no vitals filed for this visit.  Examination:  General exam: Appears calm and comfortable  Respiratory system: Clear to auscultation. Respiratory effort normal. Cardiovascular system: S1 & S2 heard, RRR. No JVD, murmurs, rubs, gallops or clicks. No pedal edema. Gastrointestinal system: Abdomen is nondistended, soft and nontender. No organomegaly or masses felt. Normal bowel sounds heard. Central nervous system: Alert and oriented. Le strength 5/5,  distal sensation intact. Brace in place Extremities: warm, no edema Skin: No rashes, lesions or ulcers Psychiatry: Judgement and insight appear normal. Mood & affect appropriate.     Data Reviewed: I have personally reviewed following labs and imaging studies  CBC: Recent Labs  Lab 10/08/20 1354 10/10/20 0530 10/11/20 0608  WBC 7.9 8.5 7.4  NEUTROABS 5.0 5.6  --   HGB 14.7 14.8 13.5  HCT 44.7 43.5 40.0  MCV 98.0 97.8 98.0  PLT 194 201 921   Basic Metabolic Panel: Recent Labs  Lab 10/08/20 1354 10/10/20 0530 10/11/20 0608 10/12/20 0430  NA 140 141 139 137  K 3.6 3.5 3.4* 4.2  CL 105 105 107 101  CO2 24 26 27 29   GLUCOSE 88 103* 113* 105*  BUN 24* 36* 26* 20  CREATININE 0.51 0.65 0.53 0.46  CALCIUM 9.4 9.3 8.5* 8.6*  MG  --   --  1.9 1.7   GFR: Estimated Creatinine Clearance: 65.2 mL/min (by C-G formula based on SCr of 0.46 mg/dL). Liver Function Tests: Recent Labs  Lab 10/10/20 0530  AST 37  ALT 10  ALKPHOS 81  BILITOT 1.2  PROT 7.2  ALBUMIN 4.2   No results for input(s): LIPASE, AMYLASE in the last 168 hours. No results for input(s): AMMONIA in the last 168 hours. Coagulation Profile: No results for input(s): INR, PROTIME in the last 168 hours. Cardiac Enzymes: No results for input(s): CKTOTAL, CKMB, CKMBINDEX, TROPONINI in the last 168 hours. BNP (last 3 results) No results for input(s): PROBNP in the last 8760 hours. HbA1C: Recent Labs    10/10/20 0530  HGBA1C 5.1   CBG: Recent Labs  Lab 10/10/20 1109 10/10/20 1903 10/11/20 0720 10/11/20 1209 10/11/20 1613  GLUCAP 90 83 99 110* 99   Lipid Profile: No results for input(s): CHOL, HDL, LDLCALC, TRIG, CHOLHDL, LDLDIRECT in the last 72 hours. Thyroid Function Tests: No results for input(s): TSH, T4TOTAL, FREET4, T3FREE, THYROIDAB in the last 72 hours. Anemia Panel: No results for input(s): VITAMINB12, FOLATE, FERRITIN, TIBC, IRON, RETICCTPCT in the last 72 hours. Urine analysis:     Component Value Date/Time   COLORURINE YELLOW 10/10/2020 0531   APPEARANCEUR CLEAR 10/10/2020 0531   APPEARANCEUR Cloudy (A) 09/05/2017 1535   LABSPEC 1.015 10/10/2020 0531   LABSPEC 1.021 01/13/2014 0737   PHURINE 7.0 10/10/2020 0531   GLUCOSEU NEGATIVE 10/10/2020 0531   GLUCOSEU >=500 01/13/2014 0737   HGBUR TRACE (A) 10/10/2020 0531   BILIRUBINUR NEGATIVE 10/10/2020 0531   BILIRUBINUR negative 05/12/2020 0838   BILIRUBINUR Negative 09/05/2017 1535   BILIRUBINUR Negative 01/13/2014 0737   KETONESUR 15 (A) 10/10/2020 0531   PROTEINUR NEGATIVE 10/10/2020 0531   UROBILINOGEN 0.2 05/12/2020 0838   NITRITE NEGATIVE 10/10/2020 0531   LEUKOCYTESUR TRACE (A) 10/10/2020 0531   LEUKOCYTESUR 3+ 01/13/2014 0737   Sepsis Labs: @LABRCNTIP (procalcitonin:4,lacticidven:4)  ) Recent Results (from the past 240 hour(s))  Urine Culture  Status: Abnormal   Collection Time: 10/08/20  1:41 PM   Specimen: Urine, Clean Catch  Result Value Ref Range Status   Specimen Description   Final    URINE, CLEAN CATCH Performed at [facility], [address] [place], [place] [address]    Special Requests   Final    NONE Performed at [facility], [address] [place], [place] [address]    Culture (A)  Final    >=100,000 COLONIES/mL AEROCOCCUS SPECIES Standardized susceptibility testing for this organism is not available. Performed at [facility], [place] [address], [place], [place] [address]    Report Status 10/10/2020 FINAL  Final  Resp Panel by RT-PCR (Flu A&B, Covid) Nasopharyngeal Swab     Status: None   Collection Time: 10/10/20  5:30 AM   Specimen: Nasopharyngeal Swab; Nasopharyngeal(NP) swabs in vial transport medium  Result Value Ref Range Status   SARS Coronavirus 2 by RT PCR NEGATIVE NEGATIVE Final    Comment: (NOTE) SARS-CoV-2 target nucleic acids are NOT DETECTED.  The SARS-CoV-2 RNA is generally detectable in upper respiratory specimens during the  acute phase of infection. The lowest concentration of SARS-CoV-2 viral copies this assay can detect is 138 copies/mL. A negative result does not preclude SARS-Cov-2 infection and should not be used as the sole basis for treatment or other patient management decisions. A negative result may occur with  improper specimen collection/handling, submission of specimen other than nasopharyngeal swab, presence of viral mutation(s) within the areas targeted by this assay, and inadequate number of viral copies(<138 copies/mL). A negative result must be combined with clinical observations, patient history, and epidemiological information. The expected result is Negative.  Fact Sheet for Patients:  EntrepreneurPulse.com.au  Fact Sheet for Healthcare Providers:  IncredibleEmployment.be  This test is no t yet approved or cleared by the [place] FDA and  has been authorized for detection and/or diagnosis of SARS-CoV-2 by FDA under an Emergency Use Authorization (EUA). This EUA will remain  in effect (meaning this test can be used) for the duration of the COVID-19 declaration under Section 564(b)(1) of the Act, 21 U.S.C.section 360bbb-3(b)(1), unless the authorization is terminated  or revoked sooner.       Influenza A by PCR NEGATIVE NEGATIVE Final   Influenza B by PCR NEGATIVE NEGATIVE Final    Comment: (NOTE) The Xpert Xpress SARS-CoV-2/FLU/RSV plus assay is intended as an aid in the diagnosis of influenza from Nasopharyngeal swab specimens and should not be used as a sole basis for treatment. Nasal washings and aspirates are unacceptable for Xpert Xpress SARS-CoV-2/FLU/RSV testing.  Fact Sheet for Patients: EntrepreneurPulse.com.au  Fact Sheet for Healthcare Providers: IncredibleEmployment.be  This test is not yet approved or cleared by the [place] FDA and has been authorized for detection and/or  diagnosis of SARS-CoV-2 by FDA under an Emergency Use Authorization (EUA). This EUA will remain in effect (meaning this test can be used) for the duration of the COVID-19 declaration under Section 564(b)(1) of the Act, 21 U.S.C. section 360bbb-3(b)(1), unless the authorization is terminated or revoked.  Performed at [facility], [address], [place], [place] [address]          Radiology Studies: No results found.      Scheduled Meds:  calcium-vitamin D  1 tablet Oral Daily   carbidopa-levodopa  2 tablet Oral QID   celecoxib  200 mg Oral BID   docusate sodium  100 mg Oral BID   enoxaparin (LOVENOX) injection  40 mg Subcutaneous Q24H   levothyroxine  100 mcg Oral QAC breakfast   multivitamin with minerals  1 tablet Oral Daily   pantoprazole  40 mg Oral Daily   polyethylene glycol  17 g Oral Daily   rOPINIRole  2 mg Oral QHS   senna  1 tablet Oral Daily   sertraline  100 mg Oral Daily   simvastatin  20 mg Oral q1800   sodium chloride flush  3 mL Intravenous Q12H   Continuous Infusions:  sodium chloride       LOS: 2 days    Time spent: 20 min    Desma Maxim, MD [facility]   If 7PM-7AM, please contact night-coverage [email or web address] Password [email or web address] 10/12/2020, 1:10 PM

## 2020-10-12 NOTE — Progress Notes (Signed)
Orthopedic Tech Progress Note Patient Details:  Theresa Mills 04/04/1950 [id number]  I called in order to [facility] because Spoke with RN about patient TLSO/LSO BRACE needing adjustments.   Patient ID: Theresa Mills, female   DOB: 05-Dec-1950, 70 y.o.   MRN: [id number]  Janit Pagan 10/12/2020, 2:51 PM

## 2020-10-12 NOTE — TOC Progression Note (Signed)
Transition of Care [facility]) - Progression Note    Patient Details  Name: Theresa Mills MRN: [id number] Date of Birth: 02/06/1950  Transition of Care [facility]) CM/SW Contact  Su Hilt, RN Phone Number: 10/12/2020, 2:44 PM  Clinical Narrative:   Spoke with the patient to obtain the bed choice, She stated she wants to wait another day or so, I explained if she put off making a bed choice the bed options could change, I explained that other people have also been made the bed offers, She stated that she would like to accept the bed offer from [facility], I notified Magda Paganini at [facility] that the patient would accept the bed         Expected Discharge Plan and Services                                                 Social Determinants of Health (SDOH) Interventions    Readmission Risk Interventions Readmission Risk Prevention Plan 08/29/2019  Transportation Screening Complete  PCP or Specialist Appt within 3-5 Days Complete  HRI or [place] Complete  Social Work Consult for [place] Planning/Counseling Complete  Palliative Care Screening Not Applicable  Medication Review Press photographer) Complete  Some recent data might be hidden

## 2020-10-12 NOTE — Progress Notes (Signed)
Physical Therapy Treatment Patient Details Name: Theresa Mills MRN: [id number] DOB: 1950-11-19 Today's Date: 10/12/2020   History of Present Illness Pt is 70 y/o F admitted on 10/10/20 after presenting to the ED with c/o back pain x several days. Pt currently being treated conservatively with TLSO before considering operative fixation.  PMH: PD, DM, hypothyroidism, HTN, GERD    PT Comments    Pt resting in bed upon PT arrival; agreeable to PT session; pt received pain medication before therapy in preparation for sessions activities.  Max assist for bed mobility (via logrolling) and initially mod assist progressing to CGA for sitting balance (pt with L lean but utilized multiple pillows on L side to support pt).  Pt reporting unable to sit on R hip d/t R hip pain.  Able to sit pt on edge of bed for about 8 minutes with TLSO brace on.  Limited activities d/t back and R hip (and chronic neck) pain--9/10 with activity and 8/10 at rest end of session.  Will continue to focus on strengthening and progressive functional mobility during hospitalization.    Recommendations for follow up therapy are one component of a multi-disciplinary discharge planning process, led by the attending physician.  Recommendations may be updated based on patient status, additional functional criteria and insurance authorization.  Follow Up Recommendations  SNF     Equipment Recommendations  None recommended by PT    Recommendations for Other Services       Precautions / Restrictions Precautions Precautions: Fall;Back Precaution Comments: Per Dr. Izora Ribas 9/20: don/doff TLSO in sitting (brace off in bed) Required Braces or Orthoses: Spinal Brace Spinal Brace: Thoracolumbosacral orthotic Restrictions Weight Bearing Restrictions: No     Mobility  Bed Mobility Overal bed mobility: Needs Assistance Bed Mobility: Rolling;Sidelying to Sit;Sit to Sidelying Rolling: Mod assist;Max assist Sidelying to sit: Max  assist;+2 for safety/equipment     Sit to sidelying: Max assist;+2 for safety/equipment General bed mobility comments: vc's for technique; assist for trunk and B LE's    Transfers                 General transfer comment: pt reporting unable yet d/t pain  Ambulation/Gait                 Stairs             Wheelchair Mobility    Modified Rankin (Stroke Patients Only)       Balance Overall balance assessment: Needs assistance Sitting-balance support: Bilateral upper extremity supported Sitting balance-Leahy Scale: Poor Sitting balance - Comments: pt leaning to L in sitting (d/t R hip pain) so multiple pillows placed on pt's L side to assist with supporting pt and improved from mod assist to CGA Postural control: Left lateral lean                                  Cognition Arousal/Alertness: Awake/alert Behavior During Therapy: Anxious Overall Cognitive Status: Within Functional Limits for tasks assessed                                        Exercises      General Comments  Nursing cleared pt for participation in physical therapy.  Pt agreeable to PT session.      Pertinent Vitals/Pain Pain Assessment: 0-10 Pain Score: 8  Pain Location: low back; also has chronic neck pain Pain Descriptors / Indicators: Grimacing;Discomfort;Tender;Sore Pain Intervention(s): Limited activity within patient's tolerance;Monitored during session;Premedicated before session;Repositioned Vitals (HR and O2 on room air) stable and WFL throughout treatment session.    Home Living                      Prior Function            PT Goals (current goals can now be found in the care plan section) Acute Rehab PT Goals Patient Stated Goal: improve pain PT Goal Formulation: With patient Time For Goal Achievement: 10/25/20 Potential to Achieve Goals: Fair Progress towards PT goals: Progressing toward goals    Frequency     7X/week      PT Plan Current plan remains appropriate    Co-evaluation              AM-PAC PT "6 Clicks" Mobility   Outcome Measure  Help needed turning from your back to your side while in a flat bed without using bedrails?: A Lot Help needed moving from lying on your back to sitting on the side of a flat bed without using bedrails?: A Lot Help needed moving to and from a bed to a chair (including a wheelchair)?: Total Help needed standing up from a chair using your arms (e.g., wheelchair or bedside chair)?: Total Help needed to walk in hospital room?: Total Help needed climbing 3-5 steps with a railing? : Total 6 Click Score: 8    End of Session Equipment Utilized During Treatment: Back brace Activity Tolerance: Patient limited by pain Patient left: in bed;with call bell/phone within reach;with bed alarm set;Other (comment) (positioned/supported on L side (with multiple pillows) in bed) Nurse Communication: Mobility status PT Visit Diagnosis: Other abnormalities of gait and mobility (R26.89);Muscle weakness (generalized) (M62.81);Pain;Difficulty in walking, not elsewhere classified (R26.2) Pain - part of body:  (low back)     Time: 1694-5038 PT Time Calculation (min) (ACUTE ONLY): 33 min  Charges:  $Therapeutic Activity: 23-37 mins                    Leitha Bleak, PT 10/12/20, 1:35 PM

## 2020-10-13 DIAGNOSIS — S32000A Wedge compression fracture of unspecified lumbar vertebra, initial encounter for closed fracture: Secondary | ICD-10-CM | POA: Diagnosis not present

## 2020-10-13 DIAGNOSIS — G20 Parkinson's disease: Secondary | ICD-10-CM | POA: Diagnosis not present

## 2020-10-13 LAB — MAGNESIUM: Magnesium: 1.6 mg/dL — ABNORMAL LOW (ref 1.7–2.4)

## 2020-10-13 LAB — RESP PANEL BY RT-PCR (FLU A&B, COVID) ARPGX2
Influenza A by PCR: NEGATIVE
Influenza B by PCR: NEGATIVE
SARS Coronavirus 2 by RT PCR: NEGATIVE

## 2020-10-13 LAB — BASIC METABOLIC PANEL
Anion gap: 5 (ref 5–15)
BUN: 24 mg/dL — ABNORMAL HIGH (ref 8–23)
CO2: 30 mmol/L (ref 22–32)
Calcium: 8.6 mg/dL — ABNORMAL LOW (ref 8.9–10.3)
Chloride: 105 mmol/L (ref 98–111)
Creatinine, Ser: 0.49 mg/dL (ref 0.44–1.00)
GFR, Estimated: >60 mL/min (ref >60)
Glucose, Bld: 118 mg/dL — ABNORMAL HIGH (ref 70–99)
Potassium: 4.1 mmol/L (ref 3.5–5.1)
Sodium: 140 mmol/L (ref 135–145)

## 2020-10-13 LAB — GLUCOSE, CAPILLARY
Glucose-Capillary: 124 mg/dL — ABNORMAL HIGH (ref 70–99)
Glucose-Capillary: 162 mg/dL — ABNORMAL HIGH (ref 70–99)

## 2020-10-13 LAB — SARS CORONAVIRUS 2 (TAT 6-24 HRS): SARS Coronavirus 2: NEGATIVE

## 2020-10-13 MED ORDER — GLYCERIN (LAXATIVE) 2 G RE SUPP
1.0000 | Freq: Once | RECTAL | Status: AC
  Administered 2020-10-13: 1 via RECTAL
  Filled 2020-10-13 (×2): qty 1

## 2020-10-13 MED ORDER — CALCITONIN (SALMON) 200 UNIT/ACT NA SOLN: 1.0000 | Freq: Every day | NASAL | 0 refills | Status: AC

## 2020-10-13 MED ORDER — SIMVASTATIN 20 MG PO TABS: ORAL_TABLET | ORAL | 0 refills | Status: DC

## 2020-10-13 NOTE — Discharge Summary (Signed)
Physician Discharge Summary  Theresa Mills VVO:[id number] DOB: 10-21-1950 DOA: 10/10/2020  PCP: Lavera Guise, MD  Admit date: 10/10/2020 Discharge date: 10/13/2020  Admitted From: home Disposition:  SNF  Recommendations for Outpatient Follow-up:  Follow up with PCP in 1-2 weeks Follow-up with neurosurgery in 1 to 2 weeks  Home Health:NA Equipment/Devices:NA  Discharge Condition:stable CODE STATUS:DNR Diet recommendation: regular  Brief/Interim Summary: L1 compression fracture without significant trauma at home. Neurosurgery evaluated and recommended non-interventional management with tlso. Pain was well controlled during hospitalization.  Prescribing Salmon calcitonin nasal spray as patient cannot reliably set up Lyna for bisphosphonate.  Discharge Diagnoses:  Principal Problem:   Compression fracture of lumbar vertebra (HCC) Active Problems:   Depression   Diabetes mellitus type 2, uncomplicated (HCC)   Essential hypertension   Parkinson disease ([place])   Acquired hypothyroidism   Back pain  Allergies as of 10/13/2020       Reactions   Codeine Other (See Comments)   HALLUCINATIONS   Ephadrene [cholestatin] Other (See Comments)   HYPER AND NERVOUS   Other Other (See Comments)   HYPER HYPER AND NERVOUS   Valium [diazepam] Other (See Comments)   OVERLY SENSITIVE/TOO STRONG   Ephedrine         Medication List     STOP taking these medications    ALPRAZolam 0.5 MG tablet Commonly known as: XANAX   citalopram 20 MG tablet Commonly known as: CELEXA   docusate sodium 100 MG capsule Commonly known as: COLACE   mirtazapine 15 MG tablet Commonly known as: REMERON       TAKE these medications    calcitonin (salmon) 200 UNIT/ACT nasal spray Commonly known as: Miacalcin Place 1 spray into alternate nostrils daily.   Calcium Carbonate-Vitamin D 600-200 MG-UNIT Tabs Take 1 tablet by mouth daily.   carbidopa-levodopa 50-200 MG tablet Commonly known as:  SINEMET CR Take 1 tablet by mouth 2 (two) times daily.   carbidopa-levodopa 25-100 MG tablet Commonly known as: SINEMET IR Take 2 tablets by mouth 4 (four) times daily.   celecoxib 200 MG capsule Commonly known as: CELEBREX TAKE 1 CAPSULE BY MOUTH TWICE DAILY AS NEEDED What changed: See the new instructions.   clotrimazole-betamethasone cream Commonly known as: Lotrisone Apply 1 application topically 2 (two) times daily. What changed:  when to take this reasons to take this   glucose blood test strip TEST BLOOD SUGAR BID   levothyroxine 100 MCG tablet Commonly known as: SYNTHROID Take 100 mcg by mouth daily before breakfast. Was changed 09/09/20 by Dr Ronnald Collum.  Was taking 88 mcg   Lidocaine 4 % Ptch Apply 1 patch topically daily.   melatonin 3 MG Tabs tablet Take 3 mg by mouth at bedtime as needed (sleep).   metFORMIN 500 MG tablet Commonly known as: GLUCOPHAGE TAKE 1 TABLET BY MOUTH TWICE DAILY What changed: when to take this   MULTIVITAMIN WOMEN 50+ PO Take 1 tablet by mouth daily.   omeprazole 40 MG capsule Commonly known as: PRILOSEC TAKE 1 CAPSULE BY MOUTH EVERY DAY What changed: how much to take   OneTouch Delica Lancets 26R Misc TEST BLOOD SUGAR BID   rOPINIRole 2 MG tablet Commonly known as: REQUIP TAKE ONE TABLET BY MOUTH AT BEDTIME FOR RESTLESS LEG What changed:  See the new instructions. Another medication with the same name was removed. Continue taking this medication, and follow the directions you see here.   sertraline 100 MG tablet Commonly known as: ZOLOFT Take 1 tablet (  100 mg total) by mouth daily. What changed: when to take this   simvastatin 20 MG tablet Commonly known as: ZOCOR Take one tab po qhs for cholesterol What changed: medication strength        Contact information for after-discharge care     [facility] Preferred SNF .    Service: Skilled Nursing Contact information: [place] [phone]                    Allergies  Allergen Reactions   Codeine Other (See Comments)    HALLUCINATIONS   Ephadrene [Cholestatin] Other (See Comments)    HYPER AND NERVOUS   Other Other (See Comments)    HYPER  HYPER AND NERVOUS   Valium [Diazepam] Other (See Comments)    OVERLY SENSITIVE/TOO STRONG   Ephedrine     Consultations: neurosurgery   Procedures/Studies: DG Lumbar Spine 2-3 Views  Result Date: 10/12/2020 CLINICAL DATA:  L1 compression fracture EXAM: LUMBAR SPINE - 2 VIEW COMPARISON:  10/08/2020, 10/10/2020 FINDINGS: Five lumbar type vertebral bodies are again visualized. Significant scoliosis concave to the right is noted similar to that seen on prior CT examination. The known acute L1 fracture is not well evaluated on this exam. There is some mild suggestion on the lateral projection of height loss. Chronic L4 compression deformity is noted similar to that seen on recent CT examination. Retrolisthesis at L3-4 is noted with grade 2 anterolisthesis of L5-S1 seen stable from the prior CT examination. IMPRESSION: Known L1 compression fracture is not as well appreciated on this exam as on the recent CT. Chronic L4 compression deformity as well as chronic alignment abnormalities at L3-4 and L5-S1. Electronically Signed   By: Inez Catalina M.D.   On: 10/12/2020 15:56   DG Lumbar Spine Complete  Result Date: 10/08/2020 CLINICAL DATA:  Low back pain after twisting injury EXAM: LUMBAR SPINE - COMPLETE 4+ VIEW COMPARISON:  09/30/2019 MRI FINDINGS: Moderate convex left lumbar spine curvature. Sacroiliac joints are symmetric. Osteopenia. Secondary to spinal curvature, the lateral view is limited for evaluation of vertebral body height loss. L2 through L4 vertebral body height loss is grossly similar. Previous intervertebral disc irregularity at L3-4 was consistent  with discitis/osteomyelitis on prior MRI. There is also intervertebral disc irregularity at L2-3, new since 09/02/2019 CT. Marked facet arthropathy. L5-S1 malalignment is grossly similar. IMPRESSION: Limited radiographs, secondary to the extent of spinal curvature and underlying chronic vertebral body height loss and disc irregularity. Grossly similar appearance of vertebral body height loss at L2-4 with intervertebral disc irregularity. Persistent L5-S1 malalignment. Given above limitations, and the patient's complex clinical history (see report of lumbar spine of 09/30/2019), repeat CT or MRI should be considered. Electronically Signed   By: Abigail Miyamoto M.D.   On: 10/08/2020 12:30   CT HEAD WO CONTRAST (5MM)  Result Date: 10/10/2020 CLINICAL DATA:  Mental status changes. EXAM: CT HEAD WITHOUT CONTRAST TECHNIQUE: Contiguous axial images were obtained from the base of the skull through the vertex without intravenous contrast. COMPARISON:  08/19/2019 FINDINGS: Brain: There is no evidence for acute hemorrhage, hydrocephalus, mass lesion, or abnormal extra-axial fluid collection. No definite CT evidence for acute infarction. Diffuse loss of parenchymal volume is consistent with atrophy. Patchy low attenuation in the deep hemispheric and periventricular white matter is nonspecific, but likely reflects chronic microvascular ischemic demyelination. Vascular: No hyperdense vessel or unexpected  calcification. Skull: No evidence for fracture. No worrisome lytic or sclerotic lesion. Sinuses/Orbits: The visualized paranasal sinuses and mastoid air cells are clear. Visualized portions of the globes and intraorbital fat are unremarkable. Other: None. IMPRESSION: 1. No acute intracranial abnormality. 2. Atrophy with chronic small vessel white matter ischemic disease. Electronically Signed   By: Misty Stanley M.D.   On: 10/10/2020 07:11   CT Lumbar Spine Wo Contrast  Result Date: 10/08/2020 CLINICAL DATA:  Low back pain.  Trauma. History of L3-4 discitis and osteomyelitis. EXAM: CT LUMBAR SPINE WITHOUT CONTRAST TECHNIQUE: Multidetector CT imaging of the lumbar spine was performed without intravenous contrast administration. Multiplanar CT image reconstructions were also generated. COMPARISON:  CT lumbar spine 06/10/2017. MRI lumbar spine 09/30/2019 FINDINGS: Segmentation: Normal Alignment: Moderate levoscoliosis. Mild retrolisthesis L1-2, and L2-3. 9 mm retrolisthesis L3-4. 13 mm anterolisthesis L5-S1. Vertebrae: Moderately severe compression fracture of L4 vertebral body, right greater than left. This was not present in 2019 but is similar to the MRI 09/30/2019. There is evidence of chronic osteomyelitis at L3-4 with chronic erosive changes in the endplates which are now sclerotic. No other fracture. Paraspinal and other soft tissues: Negative for paraspinous mass, edema, or fluid collection. Disc levels: L1-2: Disc and facet degeneration with spurring. No significant stenosis L2-3: Moderate disc degeneration and spurring right greater than left. Mild subarticular and foraminal stenosis on the right due to spurring. Spinal canal adequate in size. Bilateral facet degeneration. L3-4: Changes of chronic osteomyelitis as described above. Marked left foraminal encroachment due to spurring. Right foramen patent. Spinal canal patent. L4-5: Disc and facet degeneration without stenosis L5-S1: 13 mm anterolisthesis with bilateral pars defects of L5. Moderate left foraminal encroachment. Right foramen patent. IMPRESSION: 1. Negative for acute fracture 2. Moderate scoliosis. Extensive degenerative change throughout the lumbar spine as above 3. Chronic osteomyelitis at L3-4, stable from prior studies. Electronically Signed   By: Franchot Gallo M.D.   On: 10/08/2020 13:12   CT ABDOMEN PELVIS W CONTRAST  Result Date: 10/10/2020 CLINICAL DATA:  Abdominal pain. EXAM: CT ABDOMEN AND PELVIS WITH CONTRAST TECHNIQUE: Multidetector CT imaging of the  abdomen and pelvis was performed using the standard protocol following bolus administration of intravenous contrast. CONTRAST:  26mL OMNIPAQUE IOHEXOL 350 MG/ML SOLN COMPARISON:  09/02/2019 FINDINGS: Lower chest: Unremarkable. Hepatobiliary: No suspicious focal abnormality within the liver parenchyma. Tiny hypodensities in the lateral right liver are stable in the interval, compatible with tiny cysts. Gallbladder surgically absent. No intrahepatic or extrahepatic biliary dilation. Pancreas: No focal mass lesion. No dilatation of the main duct. No intraparenchymal cyst. No peripancreatic edema. Spleen: No splenomegaly. No focal mass lesion. Adrenals/Urinary Tract: No adrenal nodule or mass. Several small nonobstructing stones in the right kidney measure up to 5 mm. Several nonobstructing stones also noted left kidney measuring up to about 6 mm. Stable small interpolar left renal cyst. Despite the lack of secondary changes in the left kidney and ureter, a 3 x 3 x 3 mm stone is identified in the distal left ureter just proximal to the UVJ (image 74/2). No bladder stones. Stomach/Bowel: Stomach is unremarkable. No gastric wall thickening. No evidence of outlet obstruction. Duodenum is normally positioned as is the ligament of Treitz. No small bowel wall thickening. No small bowel dilatation. Terminal ileum and appendix not clearly visualized due to motion artifact. No gross colonic mass. No colonic wall thickening. Vascular/Lymphatic: There is moderate atherosclerotic calcification of the abdominal aorta without aneurysm. There is no gastrohepatic or hepatoduodenal ligament lymphadenopathy. No  retroperitoneal or mesenteric lymphadenopathy. No pelvic sidewall lymphadenopathy. Reproductive: Uterus surgically absent.  There is no adnexal mass. Other: No intraperitoneal free fluid. Musculoskeletal: Small umbilical hernia contains only fat. Degenerative changes are noted in the hips, right greater than left interval further  loss of vertebral body height at the L4 compression fracture. Advanced degenerative disc disease noted in the lumbar spine. There is a new oblique fracture through the inferior L1 vertebral body that appears acute (sagittal 97/6). Convex leftward thoracolumbar scoliosis again noted. IMPRESSION: 1. 3 x 3 x 3 mm distal left ureteral stone without appreciable secondary changes in the left kidney or ureter. 2. Bilateral nonobstructing nephrolithiasis. 3. Acute oblique fracture through the inferior L1 vertebral body without substantial loss of vertebral body height. 4. Further compression deformity at the L4 compression fracture seen previously. 5. Aortic Atherosclerosis (ICD10-I70.0). Electronically Signed   By: Misty Stanley M.D.   On: 10/10/2020 07:20   CT L-SPINE NO CHARGE  Result Date: 10/10/2020 CLINICAL DATA:  Not acting normal.  Back pain EXAM: CT Lumbar spine with contrast TECHNIQUE: Multiplanar CT images of the lumbar spine were reconstructed from contemporary CT of the abdomen and Pelvis CONTRAST:  None additional COMPARISON:  CT from 2 days ago FINDINGS: CT LUMBAR SPINE FINDINGS Segmentation: 5 lumbar type vertebrae Alignment: Scoliosis. Grade 2 anterolisthesis at L5-S1 due to chronic L5 pars defects. Posttraumatic retrolisthesis at L3-4. Vertebrae: Oblique fracture through the body of L1, reaching the inferior endplate, nondisplaced. Chronic superior endplate fracture and depression with retropulsion at L4, non progressed. Remote L3-4 discitis/osteomyelitis with endplate destruction and sclerosis. Paraspinal and other soft tissues: No evidence of perispinal mass or inflammation Disc levels: T12- L1: Bridging osteophyte L1-L2: Bulky endplate osteophytosis eccentric to the right. Facet spurring and moderate foraminal narrowing L2-L3: Disc collapse and eccentric right bulky spurring. Bilateral facet spurring and foraminal impingement. L3-L4: Postinfectious disc and endplate irregularity. Facet spurring  asymmetric to the left. Advanced left foraminal impingement. High-grade spinal stenosis L4-L5: Facet spurring asymmetric to the left. L5-S1:Chronic L5 pars defects with anterolisthesis and disc collapse. Asymmetric left foraminal impingement. Patulous canal. IMPRESSION: 1. Nondisplaced acute L1 body fracture. 2. Sequela of L3-4 discitis osteomyelitis, advanced degenerative disease, L5 chronic pars defects, and scoliosis/listhesis. 3. Multilevel foraminal narrowing with marked left foraminal impingement at L3-4 and L5-S1. 4. Advanced spinal stenosis at L3-4. Electronically Signed   By: Jorje Guild M.D.   On: 10/10/2020 07:12   DG Chest Portable 1 View  Result Date: 10/10/2020 CLINICAL DATA:  Shortness of breath EXAM: PORTABLE CHEST 1 VIEW COMPARISON:  08/19/2019 FINDINGS: Normal heart size and mediastinal contours. There is no edema, consolidation, effusion, or pneumothorax. No acute osseous finding. IMPRESSION: No evidence of active disease. Electronically Signed   By: Jorje Guild M.D.   On: 10/10/2020 04:57   DG Hip Unilat W or Wo Pelvis 2-3 Views Right  Result Date: 10/05/2020 CLINICAL DATA:  Right hip pain. EXAM: DG HIP (WITH OR WITHOUT PELVIS) 2-3V RIGHT COMPARISON:  None. FINDINGS: There is no evidence of hip fracture or dislocation. Severe narrowing of the right hip joint is noted with osteophyte formation. IMPRESSION: Severe osteoarthritis of right hip.  No acute abnormality seen. Electronically Signed   By: Marijo Conception M.D.   On: 10/05/2020 13:36    Subjective: Patient is doing overall well today. Pain is well controlled.   Discharge Exam: Vitals:   10/13/20 0837 10/13/20 1256  BP: 113/63 (!) 117/53  Pulse: 63 72  Resp: 16 16  Temp: 98.6 F (37 C)   SpO2: 96% 94%   Vitals:   10/12/20 2234 10/13/20 0512 10/13/20 0837 10/13/20 1256  BP: 110/75 134/77 113/63 (!) 117/53  Pulse: 74 66 63 72  Resp: 18 18 16 16   Temp: 98 F (36.7 C) 98.3 F (36.8 C) 98.6 F (37 C)    TempSrc: Oral Oral    SpO2: 95% 96% 96% 94%    General: Pt is alert, awake, not in acute distress, obese Cardiovascular: RRR, S1/S2  Respiratory: CTA bilaterally, no wheezing, no rhonchi Abdominal: Soft, NT, ND Extremities: 1+ bilateral LE edema, no cyanosis  Microbiology: Recent Results (from the past 240 hour(s))  Urine Culture     Status: Abnormal   Collection Time: 10/08/20  1:41 PM   Specimen: Urine, Clean Catch  Result Value Ref Range Status   Specimen Description   Final    URINE, CLEAN CATCH Performed at [facility], [address], [place], [place] [address]    Special Requests   Final    NONE Performed at [facility], [address], [place], [place] [address]    Culture (A)  Final    >=100,000 COLONIES/mL AEROCOCCUS SPECIES Standardized susceptibility testing for this organism is not available. Performed at [facility], [place] [address], [place], [place] [address]    Report Status 10/10/2020 FINAL  Final  Resp Panel by RT-PCR (Flu A&B, Covid) Nasopharyngeal Swab     Status: None   Collection Time: 10/10/20  5:30 AM   Specimen: Nasopharyngeal Swab; Nasopharyngeal(NP) swabs in vial transport medium  Result Value Ref Range Status   SARS Coronavirus 2 by RT PCR NEGATIVE NEGATIVE Final    Comment: (NOTE) SARS-CoV-2 target nucleic acids are NOT DETECTED.  The SARS-CoV-2 RNA is generally detectable in upper respiratory specimens during the acute phase of infection. The lowest concentration of SARS-CoV-2 viral copies this assay can detect is 138 copies/mL. A negative result does not preclude SARS-Cov-2 infection and should not be used as the sole basis for treatment or other patient management decisions. A negative result may occur with  improper specimen collection/handling, submission of specimen other than nasopharyngeal swab, presence of viral mutation(s) within the areas targeted by this assay, and inadequate number of  viral copies(<138 copies/mL). A negative result must be combined with clinical observations, patient history, and epidemiological information. The expected result is Negative.  Fact Sheet for Patients:  EntrepreneurPulse.com.au  Fact Sheet for Healthcare Providers:  IncredibleEmployment.be  This test is no t yet approved or cleared by the [place] FDA and  has been authorized for detection and/or diagnosis of SARS-CoV-2 by FDA under an Emergency Use Authorization (EUA). This EUA will remain  in effect (meaning this test can be used) for the duration of the COVID-19 declaration under Section 564(b)(1) of the Act, 21 U.S.C.section 360bbb-3(b)(1), unless the authorization is terminated  or revoked sooner.       Influenza A by PCR NEGATIVE NEGATIVE Final   Influenza B by PCR NEGATIVE NEGATIVE Final    Comment: (NOTE) The Xpert Xpress SARS-CoV-2/FLU/RSV plus assay is intended as an aid in the diagnosis of influenza from Nasopharyngeal swab specimens and should not be used as a sole basis for treatment. Nasal washings and aspirates are unacceptable for Xpert Xpress SARS-CoV-2/FLU/RSV testing.  Fact Sheet for Patients: EntrepreneurPulse.com.au  Fact Sheet for Healthcare Providers: IncredibleEmployment.be  This test is not yet approved or cleared by the [place] FDA and has been authorized for detection  and/or diagnosis of SARS-CoV-2 by FDA under an Emergency Use Authorization (EUA). This EUA will remain in effect (meaning this test can be used) for the duration of the COVID-19 declaration under Section 564(b)(1) of the Act, 21 U.S.C. section 360bbb-3(b)(1), unless the authorization is terminated or revoked.  Performed at [facility], [address] [place], [place] [address]      Labs:  Basic Metabolic Panel: Recent Labs  Lab 10/08/20 1354 10/10/20 0530 10/11/20 0608  10/12/20 0430 10/13/20 0357  NA 140 141 139 137 140  K 3.6 3.5 3.4* 4.2 4.1  CL 105 105 107 101 105  CO2 24 26 27 29 30   GLUCOSE 88 103* 113* 105* 118*  BUN 24* 36* 26* 20 24*  CREATININE 0.51 0.65 0.53 0.46 0.49  CALCIUM 9.4 9.3 8.5* 8.6* 8.6*  MG  --   --  1.9 1.7 1.6*   Liver Function Tests: Recent Labs  Lab 10/10/20 0530  AST 37  ALT 10  ALKPHOS 81  BILITOT 1.2  PROT 7.2  ALBUMIN 4.2    CBC: Recent Labs  Lab 10/08/20 1354 10/10/20 0530 10/11/20 0608  WBC 7.9 8.5 7.4  NEUTROABS 5.0 5.6  --   HGB 14.7 14.8 13.5  HCT 44.7 43.5 40.0  MCV 98.0 97.8 98.0  PLT 194 201 194    Microbiology Recent Results (from the past 240 hour(s))  Urine Culture     Status: Abnormal   Collection Time: 10/08/20  1:41 PM   Specimen: Urine, Clean Catch  Result Value Ref Range Status   Specimen Description   Final    URINE, CLEAN CATCH Performed at [facility], [address] [place], [place] [address]    Special Requests   Final    NONE Performed at [facility], [address] [place], [place] [address]    Culture (A)  Final    >=100,000 COLONIES/mL AEROCOCCUS SPECIES Standardized susceptibility testing for this organism is not available. Performed at [facility], [place] [address], [place], [place] [address]    Report Status 10/10/2020 FINAL  Final  Resp Panel by RT-PCR (Flu A&B, Covid) Nasopharyngeal Swab     Status: None   Collection Time: 10/10/20  5:30 AM   Specimen: Nasopharyngeal Swab; Nasopharyngeal(NP) swabs in vial transport medium  Result Value Ref Range Status   SARS Coronavirus 2 by RT PCR NEGATIVE NEGATIVE Final    Comment: (NOTE) SARS-CoV-2 target nucleic acids are NOT DETECTED.  The SARS-CoV-2 RNA is generally detectable in upper respiratory specimens during the acute phase of infection. The lowest concentration of SARS-CoV-2 viral copies this assay can detect is 138 copies/mL. A negative result does not preclude  SARS-Cov-2 infection and should not be used as the sole basis for treatment or other patient management decisions. A negative result may occur with  improper specimen collection/handling, submission of specimen other than nasopharyngeal swab, presence of viral mutation(s) within the areas targeted by this assay, and inadequate number of viral copies(<138 copies/mL). A negative result must be combined with clinical observations, patient history, and epidemiological information. The expected result is Negative.  Fact Sheet for Patients:  EntrepreneurPulse.com.au  Fact Sheet for Healthcare Providers:  IncredibleEmployment.be  This test is no t yet approved or cleared by the [place] FDA and  has been authorized for detection and/or diagnosis of SARS-CoV-2 by FDA under an Emergency Use Authorization (EUA). This EUA will remain  in effect (meaning this test can be used) for the  duration of the COVID-19 declaration under Section 564(b)(1) of the Act, 21 U.S.C.section 360bbb-3(b)(1), unless the authorization is terminated  or revoked sooner.       Influenza A by PCR NEGATIVE NEGATIVE Final   Influenza B by PCR NEGATIVE NEGATIVE Final    Comment: (NOTE) The Xpert Xpress SARS-CoV-2/FLU/RSV plus assay is intended as an aid in the diagnosis of influenza from Nasopharyngeal swab specimens and should not be used as a sole basis for treatment. Nasal washings and aspirates are unacceptable for Xpert Xpress SARS-CoV-2/FLU/RSV testing.  Fact Sheet for Patients: EntrepreneurPulse.com.au  Fact Sheet for Healthcare Providers: IncredibleEmployment.be  This test is not yet approved or cleared by the [place] FDA and has been authorized for detection and/or diagnosis of SARS-CoV-2 by FDA under an Emergency Use Authorization (EUA). This EUA will remain in effect (meaning this test can be used) for the duration of  the COVID-19 declaration under Section 564(b)(1) of the Act, 21 U.S.C. section 360bbb-3(b)(1), unless the authorization is terminated or revoked.  Performed at [facility], [address], [place], [place] [address]      Time coordinating discharge: Over 30 minutes  Richarda Osmond, MD  [facility] 10/13/2020, 1:29 PM Pager   If 7PM-7AM, please contact night-coverage [email or web address] Password [email or web address]

## 2020-10-13 NOTE — Progress Notes (Signed)
Occupational Therapy Treatment Patient Details Name: Theresa Mills MRN: [id number] DOB: Jan 13, 1951 Today's Date: 10/13/2020   History of present illness Pt is 70 y/o F admitted on 10/10/20 after presenting to the ED with c/o back pain x several days. Pt currently being treated conservatively with TLSO before considering operative fixation.  PMH: PD, DM, hypothyroidism, HTN, GERD   OT comments  Ms Mian was seen for OT treatment on this date. Upon arrival to room pt requesting to use bed pan. Pt requires MAX A toileting at bed level, assist for rolling and perihygiene. Pt reports 9/10 back pain, defers further activity and left with call bell in reach. Pt instructed on IS frequency and use, return demonstrated bed level HEP with MIN cues. Pt making progress toward goals. Pt continues to benefit from skilled OT services to maximize return to PLOF and minimize risk of future falls, injury, caregiver burden, and readmission. Will continue to follow POC. Discharge recommendation remains appropriate.      Recommendations for follow up therapy are one component of a multi-disciplinary discharge planning process, led by the attending physician.  Recommendations may be updated based on patient status, additional functional criteria and insurance authorization.    Follow Up Recommendations  SNF    Equipment Recommendations  Other (comment) (defer to next venue of care)    Recommendations for Other Services      Precautions / Restrictions Precautions Precautions: Fall;Back Precaution Comments: Per Dr. Izora Ribas 9/20: don/doff TLSO in sitting (brace off in bed) Required Braces or Orthoses: Spinal Brace Spinal Brace: Thoracolumbosacral orthotic Restrictions Weight Bearing Restrictions: No       Mobility Bed Mobility Overal bed mobility: Needs Assistance Bed Mobility: Rolling Rolling: Max assist                         ADL either performed or assessed with clinical judgement    ADL Overall ADL's : Needs assistance/impaired                                       General ADL Comments: MAX A toileting at bed level, assist for rolling and perihygiene      Cognition Arousal/Alertness: Awake/alert Behavior During Therapy: Anxious Overall Cognitive Status: Within Functional Limits for tasks assessed                                          Exercises Exercises: Other exercises Other Exercises Other Exercises: Pt educated re: OT role, DME recs, d/c recs, falls prevention, pain mgmt, IS frequency/use Other Exercises: LBD, toileting, rolling           Pertinent Vitals/ Pain       Pain Assessment: 0-10 Pain Score: 9  Pain Location: low back; also has chronic neck pain Pain Descriptors / Indicators: Grimacing;Discomfort;Tender;Sore Pain Intervention(s): Limited activity within patient's tolerance;Repositioned;Patient requesting pain meds-RN notified   Frequency  Min 1X/week        Progress Toward Goals  OT Goals(current goals can now be found in the care plan section)  Progress towards OT goals: Progressing toward goals  Acute Rehab OT Goals Patient Stated Goal: improve pain OT Goal Formulation: With patient Time For Goal Achievement: 10/25/20 Potential to Achieve Goals: Good ADL Goals Pt Will Perform Upper Body Bathing: sitting;with supervision  Pt Will Perform Lower Body Dressing: sitting/lateral leans;with min guard assist Pt Will Transfer to Toilet: with supervision;stand pivot transfer Pt Will Perform Tub/Shower Transfer: shower seat;Stand pivot transfer;with supervision  Plan Discharge plan remains appropriate;Frequency remains appropriate    Co-evaluation                 AM-PAC OT "6 Clicks" Daily Activity     Outcome Measure   Help from another person eating meals?: None Help from another person taking care of personal grooming?: A Little Help from another person toileting, which includes  using toliet, bedpan, or urinal?: A Lot Help from another person bathing (including washing, rinsing, drying)?: A Lot Help from another person to put on and taking off regular upper body clothing?: A Lot Help from another person to put on and taking off regular lower body clothing?: A Lot 6 Click Score: 15    End of Session    OT Visit Diagnosis: Unsteadiness on feet (R26.81);Muscle weakness (generalized) (M62.81);Pain;Other symptoms and signs involving the nervous system (R29.898)   Activity Tolerance Patient limited by pain;Patient tolerated treatment well   Patient Left in bed;with bed alarm set;with call bell/phone within reach   Nurse Communication Patient requests pain meds        Time: 2952-8413 OT Time Calculation (min): 19 min  Charges: OT General Charges $OT Visit: 1 Visit OT Treatments $Self Care/Home Management : 8-22 mins  Dessie Coma, M.S. OTR/L  10/13/20, 12:20 PM  ascom [phone]

## 2020-10-13 NOTE — Plan of Care (Signed)

## 2020-10-13 NOTE — TOC Progression Note (Signed)
Transition of Care [facility]) - Progression Note    Patient Details  Name: Theresa Mills MRN: [id number] Date of Birth: 04-16-1950  Transition of Care [facility]) CM/SW [place], RN Phone Number: 10/13/2020, 1:50 PM  Clinical Narrative:     Met with the patient and spoke to her friend Vaughan Basta on the phone while in the room, The patient will be going to Room [facility] once the Covid test comes back, EMS to be called for transport       Expected Discharge Plan and Services           Expected Discharge Date: 10/13/20                                     Social Determinants of Health (SDOH) Interventions    Readmission Risk Interventions Readmission Risk Prevention Plan 08/29/2019  Transportation Screening Complete  PCP or Specialist Appt within 3-5 Days Complete  HRI or [place] Complete  Social Work Consult for [place] Planning/Counseling Complete  Palliative Care Screening Not Applicable  Medication Review [employer] photographer) Complete  Some recent data might be hidden

## 2020-10-13 NOTE — TOC Progression Note (Signed)
Transition of Care [facility]) - Progression Note    Patient Details  Name: Theresa Mills MRN: [id number] Date of Birth: 06/04/1950  Transition [facility]) CM/SW Contact  Su Hilt, RN Phone Number: 10/13/2020, 3:38 PM  Clinical Narrative:    Covid test is back and is negative The bedside nurse to call report to  [facility], she is going to room 403, [facility] has been called and stated there are several ahead of the patient and it could be a while        Expected Discharge Plan and Services           Expected Discharge Date: 10/13/20                                     Social Determinants of Health (SDOH) Interventions    Readmission Risk Interventions Readmission Risk Prevention Plan 08/29/2019  Transportation Screening Complete  PCP or Specialist Appt within 3-5 Days Complete  HRI or [place] Complete  Social Work Consult for [place] Planning/Counseling Complete  Palliative Care Screening Not Applicable  Medication Review Press photographer) Complete  Some recent data might be hidden

## 2020-10-13 NOTE — Care Management Important Message (Signed)
Important Message  Patient Details  Name: Theresa Mills MRN: [id number] Date of Birth: 01-07-1951   Medicare Important Message Given:  Yes     [name] A Nikaya Nasby 10/13/2020, 10:39 AM

## 2020-10-13 NOTE — Progress Notes (Addendum)
Report called to [facility], LPN with opportunity to ask questions. Awaiting EMS to pick patient up.  EMS picked patient up at 1620 to go to Room 403 at [facility].

## 2020-10-20 ENCOUNTER — Other Ambulatory Visit: Payer: Self-pay | Admitting: Internal Medicine

## 2020-10-21 ENCOUNTER — Inpatient Hospital Stay: Admission: RE | Admit: 2020-10-21 | Payer: Medicare Other | Source: Ambulatory Visit

## 2020-10-25 ENCOUNTER — Telehealth: Payer: Self-pay | Admitting: Internal Medicine

## 2020-10-25 NOTE — Chronic Care Management (AMB) (Signed)
  Chronic Care Management   Outreach Note  10/25/2020 Name: JNAI SNELLGROVE MRN: [id number] DOB: 02-01-1950  Referred by: Lavera Guise, MD Reason for referral : No chief complaint on file.   A second unsuccessful telephone outreach was attempted today. The patient was referred to pharmacist for assistance with care management and care coordination.  Follow Up Plan:  CALLED TO R/S MISSED APT  Tatjana Dellinger Upstream Scheduler

## 2020-11-01 ENCOUNTER — Telehealth: Payer: Self-pay | Admitting: Internal Medicine

## 2020-11-01 NOTE — Chronic Care Management (AMB) (Signed)
  Chronic Care Management   Outreach Note  11/01/2020 Name: Theresa Mills MRN: [id number] DOB: 08-Jan-1951  Referred by: Lavera Guise, MD Reason for referral : No chief complaint on file.   Third unsuccessful telephone outreach was attempted today. The patient was referred to the pharmacist for assistance with care management and care coordination.   Follow Up Plan: LEFT VM TO R/S APT  Tatjana Dellinger Upstream Scheduler

## 2020-12-01 ENCOUNTER — Telehealth: Payer: Self-pay

## 2020-12-01 NOTE — Telephone Encounter (Signed)
Unable to reach patient by phone. Sent mychart message to confirm 12/03/20 appointment-Toni

## 2020-12-03 ENCOUNTER — Ambulatory Visit: Payer: Medicare Other | Admitting: Physician Assistant

## 2020-12-03 DIAGNOSIS — Z0289 Encounter for other administrative examinations: Secondary | ICD-10-CM

## 2020-12-30 ENCOUNTER — Ambulatory Visit (INDEPENDENT_AMBULATORY_CARE_PROVIDER_SITE_OTHER): Payer: Medicare Other | Admitting: Podiatry

## 2020-12-30 DIAGNOSIS — B351 Tinea unguium: Secondary | ICD-10-CM

## 2020-12-30 DIAGNOSIS — E1142 Type 2 diabetes mellitus with diabetic polyneuropathy: Secondary | ICD-10-CM

## 2020-12-30 DIAGNOSIS — L84 Corns and callosities: Secondary | ICD-10-CM

## 2020-12-30 DIAGNOSIS — M79676 Pain in unspecified toe(s): Secondary | ICD-10-CM

## 2020-12-30 NOTE — Progress Notes (Signed)
No show  Gardiner Barefoot DPM

## 2021-01-03 ENCOUNTER — Other Ambulatory Visit: Payer: Self-pay | Admitting: Internal Medicine

## 2021-01-10 ENCOUNTER — Telehealth: Payer: Self-pay

## 2021-01-10 NOTE — Telephone Encounter (Signed)
[facility], [phone] called for verbal orders for physical therapy and nursing. Physical therapy orders are twice a week for two weeks and once a week for six weeks, nursing has not done eval yet and someone will call back for those orders.

## 2021-01-18 ENCOUNTER — Other Ambulatory Visit: Payer: Self-pay

## 2021-01-18 DIAGNOSIS — R300 Dysuria: Secondary | ICD-10-CM

## 2021-01-25 LAB — UA/M W/RFLX CULTURE, ROUTINE
Bilirubin, UA: NEGATIVE
Glucose, UA: NEGATIVE
Ketones, UA: NEGATIVE
Nitrite, UA: NEGATIVE
RBC, UA: NEGATIVE
Specific Gravity, UA: 1.024 (ref 1.005–1.030)
Urobilinogen, Ur: 1 mg/dL (ref 0.2–1.0)
pH, UA: 7 (ref 5.0–7.5)

## 2021-01-25 LAB — URINE CULTURE, REFLEX

## 2021-01-25 LAB — MICROSCOPIC EXAMINATION: Casts: NONE SEEN /lpf

## 2021-02-01 ENCOUNTER — Telehealth: Payer: Self-pay

## 2021-02-01 NOTE — Telephone Encounter (Signed)
Plan of care from [facility] signed by provider and faxed to [phone]. Given to Nimisha to hold at her desk.

## 2021-02-11 ENCOUNTER — Other Ambulatory Visit: Payer: Self-pay | Admitting: Internal Medicine

## 2021-02-16 ENCOUNTER — Telehealth: Payer: Self-pay

## 2021-02-16 NOTE — Telephone Encounter (Signed)
Completed paperwork for [facility] and [facility], faxed to [phone]

## 2021-02-18 ENCOUNTER — Telehealth: Payer: Self-pay

## 2021-02-18 NOTE — Telephone Encounter (Signed)
Theresa Mills [phone]) from [facility] called and advised that pt's Heart rate was 58 then rechecked again it was 69.  She was informed that if she has more symptoms she needs to call us back.

## 2021-02-22 ENCOUNTER — Other Ambulatory Visit: Payer: Self-pay

## 2021-02-22 ENCOUNTER — Ambulatory Visit (INDEPENDENT_AMBULATORY_CARE_PROVIDER_SITE_OTHER): Payer: Medicare Other | Admitting: Podiatry

## 2021-02-22 ENCOUNTER — Encounter: Payer: Self-pay | Admitting: Podiatry

## 2021-02-22 DIAGNOSIS — E1142 Type 2 diabetes mellitus with diabetic polyneuropathy: Secondary | ICD-10-CM

## 2021-02-22 DIAGNOSIS — B351 Tinea unguium: Secondary | ICD-10-CM | POA: Diagnosis not present

## 2021-02-22 DIAGNOSIS — M79676 Pain in unspecified toe(s): Secondary | ICD-10-CM

## 2021-02-22 NOTE — Progress Notes (Signed)
This patient returns to my office for at risk foot care.  This patient requires this care by a professional since this patient will be at risk due to having type 2 diabetes.  This patient is unable to cut nails herself since the patient cannot reach her nails.These nails are painful walking and wearing shoes. Patient has not been seen in over a year. This patient presents for at risk foot care today.  General Appearance  Alert, conversant and in no acute stress.  Vascular  Dorsalis pedis and posterior tibial  pulses are palpable  bilaterally.  Capillary return is within normal limits  bilaterally. Temperature is within normal limits  bilaterally.  Neurologic  Senn-Weinstein monofilament wire test within normal limits  bilaterally. Muscle power within normal limits bilaterally.  Nails Thick disfigured discolored nails with subungual debris  from hallux to fifth toes bilaterally. No evidence of bacterial infection or drainage bilaterally.  Orthopedic  No limitations of motion  feet .  No crepitus or effusions noted.  No bony pathology or digital deformities noted.  HAV  B/L.  Contracted digits 2-5  B/L.  Skin  normotropic skin with no porokeratosis noted bilaterally.  No signs of infections or ulcers noted.  Pinch callus right big toe.  Onychomycosis  Pain in right toes  Pain in left toes  Consent was obtained for treatment procedures.   Mechanical debridement of nails 1-5  bilaterally performed with a nail nipper.  Filed with dremel without incident.    Return office visit   12 weeks                  Told patient to return for periodic foot care and evaluation due to potential at risk complications.   Boneta Lucks D.P.M.

## 2021-02-25 ENCOUNTER — Other Ambulatory Visit: Payer: Self-pay | Admitting: Internal Medicine

## 2021-03-16 ENCOUNTER — Telehealth: Payer: Medicare Other

## 2021-03-29 ENCOUNTER — Telehealth: Payer: Self-pay

## 2021-03-29 NOTE — Telephone Encounter (Signed)
Home Health discharge summary signed by provider and faxed back to [facility] at [phone]. Placed in home health folder at the front desk. ?

## 2021-04-08 ENCOUNTER — Other Ambulatory Visit: Payer: Self-pay | Admitting: Physician Assistant

## 2021-04-13 ENCOUNTER — Ambulatory Visit: Payer: Medicare Other | Admitting: Nurse Practitioner

## 2021-04-20 ENCOUNTER — Ambulatory Visit: Payer: Medicare Other | Admitting: Nurse Practitioner

## 2021-04-22 ENCOUNTER — Other Ambulatory Visit: Payer: Self-pay | Admitting: Physician Assistant

## 2021-05-05 ENCOUNTER — Other Ambulatory Visit: Payer: Self-pay | Admitting: Physician Assistant

## 2021-05-05 ENCOUNTER — Other Ambulatory Visit: Payer: Self-pay | Admitting: Internal Medicine

## 2021-05-09 ENCOUNTER — Other Ambulatory Visit: Payer: Self-pay

## 2021-05-09 ENCOUNTER — Encounter: Payer: Self-pay | Admitting: Intensive Care

## 2021-05-09 ENCOUNTER — Emergency Department: Payer: Medicare Other

## 2021-05-09 ENCOUNTER — Emergency Department
Admission: EM | Admit: 2021-05-09 | Discharge: 2021-05-10 | Disposition: A | Discharge status: Home / Self Care | Payer: Medicare Other | Attending: Student in an Organized Health Care Education/Training Program | Admitting: Student in an Organized Health Care Education/Training Program

## 2021-05-09 DIAGNOSIS — M25561 Pain in right knee: Secondary | ICD-10-CM | POA: Insufficient documentation

## 2021-05-09 DIAGNOSIS — R82998 Other abnormal findings in urine: Secondary | ICD-10-CM | POA: Insufficient documentation

## 2021-05-09 DIAGNOSIS — G20 Parkinson's disease: Secondary | ICD-10-CM | POA: Insufficient documentation

## 2021-05-09 DIAGNOSIS — M25551 Pain in right hip: Secondary | ICD-10-CM | POA: Insufficient documentation

## 2021-05-09 DIAGNOSIS — R350 Frequency of micturition: Secondary | ICD-10-CM | POA: Insufficient documentation

## 2021-05-09 LAB — URINALYSIS, ROUTINE W REFLEX MICROSCOPIC
Bilirubin Urine: NEGATIVE
Glucose, UA: NEGATIVE mg/dL
Hgb urine dipstick: NEGATIVE
Ketones, ur: NEGATIVE mg/dL
Nitrite: NEGATIVE
Protein, ur: NEGATIVE mg/dL
Specific Gravity, Urine: 1.009 (ref 1.005–1.030)
pH: 6 (ref 5.0–8.0)

## 2021-05-09 LAB — BASIC METABOLIC PANEL
Anion gap: 10 (ref 5–15)
BUN: 22 mg/dL (ref 8–23)
CO2: 27 mmol/L (ref 22–32)
Calcium: 9 mg/dL (ref 8.9–10.3)
Chloride: 101 mmol/L (ref 98–111)
Creatinine, Ser: 0.52 mg/dL (ref 0.44–1.00)
GFR, Estimated: >60 mL/min (ref >60)
Glucose, Bld: 90 mg/dL (ref 70–99)
Potassium: 3.8 mmol/L (ref 3.5–5.1)
Sodium: 138 mmol/L (ref 135–145)

## 2021-05-09 LAB — CBC
HCT: 42 % (ref 36.0–46.0)
Hemoglobin: 13.1 g/dL (ref 12.0–15.0)
MCH: 30.3 pg (ref 26.0–34.0)
MCHC: 31.2 g/dL (ref 30.0–36.0)
MCV: 97 fL (ref 80.0–100.0)
Platelets: 206 10*3/uL (ref 150–400)
RBC: 4.33 MIL/uL (ref 3.87–5.11)
RDW: 12.8 % (ref 11.5–15.5)
WBC: 8.1 10*3/uL (ref 4.0–10.5)
nRBC: 0 % (ref 0.0–0.2)

## 2021-05-09 MED ORDER — CEPHALEXIN 500 MG PO CAPS: 500.0000 mg | ORAL_CAPSULE | Freq: Two times a day (BID) | ORAL | 0 refills | Status: AC

## 2021-05-09 MED ORDER — CEPHALEXIN 500 MG PO CAPS
500.0000 mg | ORAL_CAPSULE | Freq: Once | ORAL | Status: AC
  Administered 2021-05-09: 500 mg via ORAL
  Filled 2021-05-09: qty 1

## 2021-05-09 MED ORDER — TRAMADOL HCL 50 MG PO TABS
50.0000 mg | ORAL_TABLET | Freq: Four times a day (QID) | ORAL | 0 refills | Status: DC | PRN
Start: 2021-05-09 — End: 2021-05-13

## 2021-05-09 MED ORDER — TRAMADOL HCL 50 MG PO TABS
50.0000 mg | ORAL_TABLET | Freq: Once | ORAL | Status: AC
  Administered 2021-05-09: 50 mg via ORAL
  Filled 2021-05-09: qty 1

## 2021-05-09 NOTE — ED Notes (Signed)
Pt cleaned up of urinary incontinence. Placed in hospital gown. Linens changed. Clean brief placed on patient. Purewick placed. Pt denies further needs at this time.  ?

## 2021-05-09 NOTE — ED Notes (Signed)
Unable to confirm that a caregiver is going to be at pt's home if she is discharged. Presently, pt is to stay in the ED until a caregiver is contacted and confirms that she will be present in the home. MD and charge RN aware. ?

## 2021-05-09 NOTE — ED Notes (Signed)
Report received from Jordan, RN

## 2021-05-09 NOTE — ED Triage Notes (Signed)
First Nurse Note:  ?Pt via EMS from home. Pt has a hx of Parkinson's Disease. Per pt, her legs has been "locking up" for the past week. Pt has a appt with her PCP tomorrow but wanted to be seen today. Pt is A&Ox4 and NAD ? ?140/78 BP  ?78 HR  ?98% on RA ?

## 2021-05-09 NOTE — ED Notes (Signed)
Called [name] and spoke with her regarding a caregiver being present when pt comes home. States that the night shift caregiver has already left because she thought pt was going to be admitted. Advised that pt is being discharged and that I would call her back after I check with the other caregiver and speak with the MD.  ?

## 2021-05-09 NOTE — ED Notes (Signed)
Pt requesting her evening dose of sinemet. MD aware.  ?

## 2021-05-09 NOTE — ED Notes (Signed)
Theresa Mills called back stating that she didn't understand why pt wasn't being admitted. Attempted to explain  the admission process and she continued to talk over this RN while trying to provide an explanation. Will attempt to call night shift caregiver to ensure someone will be at the pt's home when she arrives. ?

## 2021-05-09 NOTE — ED Notes (Signed)
Spoke with pharmacy tech regarding which dose of sinemet patient needs. Pharm tech to go over med list with patient and will let me know.  ?

## 2021-05-09 NOTE — ED Triage Notes (Signed)
Patient arrived by EMS from home for weakness. Reports hx parkinsons and her knees keep locking up on her for the past few days. Appointment scheduled with PCP tomorrow. A&O x4 upon arrival to ER ?

## 2021-05-09 NOTE — ED Provider Notes (Signed)
? ?[facility] ?Provider Note ? ? ? Event Date/Time  ? First MD Initiated Contact with Patient 05/09/21 1732   ?  (approximate) ? ? ?History  ? ?Weakness ? ? ?HPI ? ?Theresa Mills is a 71 y.o. female history of Parkinson disease presents to the ER for evaluation of progressively worsening right knee pain and sometimes will lock up on her due to pain difficulty ambulating secondary to discomfort.  States she has a history of arthritis.  Denies any specific falls.  Sometimes will have some pain in her right hip but that is only when her right knee is really bothering her.  Says she is also had increased urinary frequency.  Denies any numbness or tingling.  No lateralizing weakness no headache.  No chest pain or shortness of breath ?  ? ? ?Physical Exam  ? ?Triage Vital Signs: ?ED Triage Vitals  ?Enc Vitals Group  ?   BP 05/09/21 1544 (!) 152/82  ?   Pulse Rate 05/09/21 1544 74  ?   Resp 05/09/21 1544 16  ?   Temp 05/09/21 1544 98.4 ?F (36.9 ?C)  ?   Temp Source 05/09/21 1544 Oral  ?   SpO2 05/09/21 1544 96 %  ?   Weight 05/09/21 1546 190 lb (86.2 kg)  ?   Height 05/09/21 1546 '5\' 3"'$  (1.6 m)  ?   Head Circumference --   ?   Peak Flow --   ?   Pain Score 05/09/21 1546 6  ?   Pain Loc --   ?   Pain Edu? --   ?   Excl. in [place]? --   ? ? ?Most recent vital signs: ?Vitals:  ? 05/09/21 2200 05/09/21 2230  ?BP: 121/62 130/85  ?Pulse: 62 67  ?Resp: 16 20  ?Temp:    ?SpO2: 94% 93%  ? ? ? ?Constitutional: Alert  ?Eyes: Conjunctivae are normal.  ?Head: Atraumatic. ?Nose: No congestion/rhinnorhea. ?Mouth/Throat: Mucous membranes are moist.   ?Neck: Painless ROM.  ?Cardiovascular:   Good peripheral circulation. ?Respiratory: Normal respiratory effort.  No retractions.  ?Gastrointestinal: Soft and nontender.  ?Musculoskel: Tenderness to palpation of the knee no large effusion no warmth no crepitus neurovascular intact distally ?Neurologic:  MAE spontaneously. No gross focal neurologic deficits are appreciated.   ?Skin:  Skin is warm, dry and intact. No rash noted. ?Psychiatric: Mood and affect are normal. Speech and behavior are normal. ? ? ? ?ED Results / Procedures / Treatments  ? ?Labs ?(all labs ordered are listed, but only abnormal results are displayed) ?Labs Reviewed  ?URINALYSIS, ROUTINE W REFLEX MICROSCOPIC - Abnormal; Notable for the following components:  ?    Result Value  ? Color, Urine YELLOW (*)   ? APPearance HAZY (*)   ? Leukocytes,Ua SMALL (*)   ? Bacteria, UA MANY (*)   ? All other components within normal limits  ?BASIC METABOLIC PANEL  ?CBC  ? ? ? ?EKG ? ?ED ECG REPORT ?I, Merlyn Lot, the attending physician, personally viewed and interpreted this ECG. ? ? Date: 05/09/2021 ? EKG Time: 15:47 ? Rate: 70 ? Rhythm: sinus ? Axis: left ? Intervals:  normal ? ST&T Change: no stemi, no depressions ? ? ? ?RADIOLOGY ?Please see ED Course for my review and interpretation. ? ?I personally reviewed all radiographic images ordered to evaluate for the above acute complaints and reviewed radiology reports and findings.  These findings were personally discussed with the patient.  Please see medical record  for radiology report. ? ? ? ?PROCEDURES: ? ?Critical Care performed:  ? ?Procedures ? ? ?MEDICATIONS ORDERED IN ED: ?Medications  ?cephALEXin (KEFLEX) capsule 500 mg (500 mg Oral Given 05/09/21 1959)  ?traMADol (ULTRAM) tablet 50 mg (50 mg Oral Given 05/09/21 1959)  ? ? ? ?IMPRESSION / MDM / ASSESSMENT AND PLAN / ED COURSE  ?I reviewed the triage vital signs and the nursing notes. ?             ?               ? ?Differential diagnosis includes, but is not limited to, fracture, contusion, dislocation, arthritis, DVT, cellulitis, musculoskeletal strain, UTI, dehydration, electrolyte abnormality ? ?Patient presenting to the ER for evaluation of right knee pain without injury.  X-ray ordered for the above differential does not show any evidence of fracture does have significant osteoarthritis.  Urinalysis does show  bacteria and as the patient is having increased urinary frequency will treat with antibiotics.  Patient otherwise does appear stable and appropriate for outpatient follow-up. ? ? ?  ? ? ?FINAL CLINICAL IMPRESSION(S) / ED DIAGNOSES  ? ?Final diagnoses:  ?Acute pain of right knee  ?Urinary frequency  ? ? ? ?Rx / DC Orders  ? ?ED Discharge Orders   ? ?      Ordered  ?  cephALEXin (KEFLEX) 500 MG capsule  2 times daily       ? 05/09/21 1914  ?  traMADol (ULTRAM) 50 MG tablet  Every 6 hours PRN       ? 05/09/21 1914  ? ?  ?  ? ?  ? ? ? ?Note:  This document was prepared using Dragon voice recognition software and may include unintentional dictation errors. ? ?  ?Merlyn Lot, MD ?05/09/21 2329 ? ?

## 2021-05-10 ENCOUNTER — Ambulatory Visit (INDEPENDENT_AMBULATORY_CARE_PROVIDER_SITE_OTHER): Payer: Medicare Other | Admitting: Nurse Practitioner

## 2021-05-10 ENCOUNTER — Encounter: Payer: Self-pay | Admitting: Nurse Practitioner

## 2021-05-10 VITALS — BP 101/44 | HR 77 | Temp 98.9°F | Resp 16 | Ht 63.0 in | Wt 187.0 lb

## 2021-05-10 DIAGNOSIS — Z022 Encounter for examination for admission to residential institution: Secondary | ICD-10-CM | POA: Diagnosis not present

## 2021-05-10 DIAGNOSIS — R531 Weakness: Secondary | ICD-10-CM

## 2021-05-10 DIAGNOSIS — E039 Hypothyroidism, unspecified: Secondary | ICD-10-CM | POA: Diagnosis not present

## 2021-05-10 DIAGNOSIS — G20 Parkinson's disease: Secondary | ICD-10-CM | POA: Diagnosis not present

## 2021-05-10 DIAGNOSIS — E1165 Type 2 diabetes mellitus with hyperglycemia: Secondary | ICD-10-CM | POA: Diagnosis not present

## 2021-05-10 DIAGNOSIS — Z111 Encounter for screening for respiratory tuberculosis: Secondary | ICD-10-CM

## 2021-05-10 NOTE — Progress Notes (Signed)
[facility] ?[address] ?[place], [place] [address] ? ?Internal MEDICINE  ?Office Visit Note ? ?Patient Name: Theresa Mills ? [id number]  ?[id number] ? ?Date of Service: 05/10/2021 ? ?Chief Complaint  ?Patient presents with  ? Follow-up  ?  Right leg swollen, cant get around well, referral to rehab  ? Diabetes  ? Gastroesophageal Reflux  ? Hyperlipidemia  ? Hypertension  ? Anxiety  ? ? ?HPI ?Theresa Mills presents for a follow up visit for diabetes HTN and assessment and referral to rehab and assisted living at [facility]. Paperwork was also provided to be completed prior to her admission/move-in date of May 23 2021.  ?Physical exam completed at today's office visit.  ?TB skin test done to be read in 49 hours by our staff. ?She is wheelchair-bound and requires assistance. She is in need of physical therapy.  ?She is well appearing, alert and at baseline from a neurological standpoint. She does have dementia and parkinsons disease but she is at her baseline status. She is in good spirits and is interested in participating in groups and activities during her stay at [facility].  ?Patient reports swelling off and on in her lower extremities.  ? ? ? ?Current Medication: ?Outpatient Encounter Medications as of 05/10/2021  ?Medication Sig  ? calcitonin, salmon, (MIACALCIN) 200 UNIT/ACT nasal spray Place 1 spray into alternate nostrils daily.  ? Calcium Carbonate-Vitamin D 600-200 MG-UNIT TABS Take 1 tablet by mouth daily.   ? carbidopa-levodopa (SINEMET CR) 50-200 MG tablet Take 1 tablet by mouth 2 (two) times daily.  ? carbidopa-levodopa (SINEMET IR) 25-100 MG tablet TAKE 2 TABLETS BY MOUTH 4 TIMES DAILY.  ? celecoxib (CELEBREX) 200 MG capsule TAKE 1 CAPSULE BY MOUTH TWO TIMES DAILY AS NEEDED  ? cephALEXin (KEFLEX) 500 MG capsule Take 1 capsule (500 mg total) by mouth 2 (two) times daily for 7 days.  ? clotrimazole-betamethasone (LOTRISONE) cream Apply 1 application topically 2 (two) times daily. (Patient taking  differently: Apply 1 application. topically daily as needed.)  ? glucose blood test strip TEST BLOOD SUGAR BID  ? levothyroxine (SYNTHROID) 100 MCG tablet Take 100 mcg by mouth daily before breakfast. Was changed 09/09/20 by Dr Ronnald Collum.  Was taking 88 mcg  ? Lidocaine 4 % PTCH Apply 1 patch topically daily.  ? Melatonin 3 MG TABS Take 3 mg by mouth at bedtime as needed (sleep).   ? metFORMIN (GLUCOPHAGE) 500 MG tablet TAKE 1 TABLET BY MOUTH TWICE DAILY  ? Multiple Vitamins-Minerals (MULTIVITAMIN WOMEN 50+ PO) Take 1 tablet by mouth daily.   ? omeprazole (PRILOSEC) 40 MG capsule TAKE 1 CAPSULE BY MOUTH EVERY DAY  ? ONETOUCH DELICA LANCETS 24Q MISC TEST BLOOD SUGAR BID  ? rOPINIRole (REQUIP) 2 MG tablet TAKE ONE TABLET BY MOUTH AT BEDTIME FOR RESTLESS LEG  ? sertraline (ZOLOFT) 100 MG tablet TAKE 1 TABLET BY MOUTH DAILY  ? simvastatin (ZOCOR) 20 MG tablet Take one tab po qhs for cholesterol  ? traMADol (ULTRAM) 50 MG tablet Take 1 tablet (50 mg total) by mouth every 6 (six) hours as needed.  ? simvastatin (ZOCOR) 40 MG tablet Take 40 mg by mouth at bedtime.  ? SYNTHROID 112 MCG tablet Take 112 mcg by mouth daily.  ? ?No facility-administered encounter medications on file as of 05/10/2021.  ? ? ?Surgical History: ?Past Surgical History:  ?Procedure Laterality Date  ? ABDOMINAL HYSTERECTOMY    ? CATARACT EXTRACTION Bilateral   ? CHOLECYSTECTOMY    ? COLONOSCOPY WITH PROPOFOL  N/A 04/02/2015  ? Procedure: COLONOSCOPY WITH PROPOFOL;  Surgeon: Josefine Class, MD;  Location: [facility] ENDOSCOPY;  Service: Endoscopy;  Laterality: N/A;  ? ESOPHAGOGASTRODUODENOSCOPY (EGD) WITH PROPOFOL N/A 04/02/2015  ? Procedure: ESOPHAGOGASTRODUODENOSCOPY (EGD) WITH PROPOFOL;  Surgeon: Josefine Class, MD;  Location: [facility] ENDOSCOPY;  Service: Endoscopy;  Laterality: N/A;  ? GALLBLADDER SURGERY    ? LAPAROSCOPIC HYSTERECTOMY    ? THYROIDECTOMY Left   ? TONSILLECTOMY    ? TONSILLECTOMY Bilateral   ? ? ?Medical History: ?Past Medical History:   ?Diagnosis Date  ? Anxiety   ? Diabetes ([place])   ? Difficult intravenous access   ? GERD (gastroesophageal reflux disease)   ? HBP (high blood pressure)   ? Hyperlipidemia   ? Osteoarthritis   ? Parkinson disease ([place])   ? Sleep apnea   ? Thyroid disease   ? ? ?Family History: ?Family History  ?Problem Relation Age of Onset  ? Asthma Father   ? Cancer Sister   ? Stroke Maternal Uncle   ? Heart disease Maternal Uncle   ? Diabetes Maternal Uncle   ? ? ?Social History  ? ?Socioeconomic History  ? Marital status: Widowed  ?  Spouse name: Not on file  ? Number of children: Not on file  ? Years of education: Not on file  ? Highest education level: Not on file  ?Occupational History  ? Not on file  ?Tobacco Use  ? Smoking status: Never  ? Smokeless tobacco: Never  ?Vaping Use  ? Vaping Use: Never used  ?Substance and Sexual Activity  ? Alcohol use: No  ? Drug use: No  ? Sexual activity: Not on file  ?Other Topics Concern  ? Not on file  ?Social History Narrative  ? Not on file  ? ?Social Determinants of Health  ? ?Financial Resource Strain: Not on file  ?Food Insecurity: Not on file  ?Transportation Needs: Not on file  ?Physical Activity: Not on file  ?Stress: Not on file  ?Social Connections: Not on file  ?Intimate Partner Violence: Not on file  ? ? ? ? ?Review of Systems  ?Constitutional:  Negative for chills, fatigue and unexpected weight change.  ?HENT:  Negative for congestion, rhinorrhea, sneezing and sore throat.   ?Eyes:  Negative for redness.  ?Respiratory: Negative.  Negative for cough, chest tightness, shortness of breath and wheezing.   ?Cardiovascular: Negative.  Negative for chest pain and palpitations.  ?Gastrointestinal:  Negative for abdominal pain, constipation, diarrhea, nausea and vomiting.  ?Genitourinary:  Negative for dysuria and frequency.  ?     Urinary incontinence  ?Musculoskeletal:  Positive for arthralgias, back pain and gait problem. Negative for joint swelling and neck pain.  ?Skin:  Negative  for rash.  ?Neurological:  Positive for tremors and weakness. Negative for numbness.  ?Hematological:  Negative for adenopathy. Does not bruise/bleed easily.  ?Psychiatric/Behavioral:  Negative for behavioral problems (Depression), self-injury, sleep disturbance and suicidal ideas. The patient is not nervous/anxious.   ?     Patient is at baseline regarding her dementia and parkinson's disease.   ? ?Vital Signs: ?BP (!) 101/44   Pulse 77   Temp 98.9 ?F (37.2 ?C)   Resp 16   Ht '5\' 3"'$  (1.6 m)   Wt 187 lb (84.8 kg)   SpO2 97%   BMI 33.13 kg/m?  ? ? ?Physical Exam ?Vitals reviewed.  ?Constitutional:   ?   General: She is not in acute distress. ?   Appearance: Normal appearance.  She is obese. She is not ill-appearing.  ?HENT:  ?   Head: Normocephalic and atraumatic.  ?   Right Ear: Tympanic membrane, ear canal and external ear normal.  ?   Left Ear: Tympanic membrane, ear canal and external ear normal.  ?   Nose: Nose normal.  ?   Mouth/Throat:  ?   Mouth: Mucous membranes are moist.  ?   Pharynx: Oropharynx is clear.  ?Cardiovascular:  ?   Rate and Rhythm: Normal rate and regular rhythm.  ?   Pulses: Normal pulses.  ?   Heart sounds: Normal heart sounds.  ?Pulmonary:  ?   Effort: Pulmonary effort is normal. No respiratory distress.  ?   Breath sounds: Normal breath sounds. No wheezing.  ?Skin: ?   General: Skin is warm and dry.  ?   Capillary Refill: Capillary refill takes less than 2 seconds.  ?Neurological:  ?   Mental Status: She is alert. Mental status is at baseline.  ?   Motor: Weakness present.  ?   Gait: Gait abnormal.  ?Psychiatric:     ?   Mood and Affect: Mood normal. Affect is flat.     ?   Speech: Speech is delayed.     ?   Behavior: Behavior is slowed and withdrawn. Behavior is cooperative.     ?   Cognition and Memory: Memory is impaired.  ? ? ? ? ? ?Assessment/Plan: ?1. Encounter for examination for admission to residential institution ?Paperwork provided to complete.  ? ?2. Parkinson disease  ([place]) ?Stable at baseline on current medications.  ?- rOPINIRole (REQUIP) 2 MG tablet; Take 1 tablet (2 mg total) by mouth at bedtime.  Dispense: 90 tablet; Refill: 1 ?- carbidopa-levodopa (SINEMET IR) 25-100 MG tablet

## 2021-05-10 NOTE — ED Notes (Signed)
Pt cleaned and repositioned in the bed at this time. Pt soiled with urine. Bed pad and brief changed.  ?

## 2021-05-10 NOTE — ED Notes (Signed)
Patient is requesting something for pain and ice water. ?

## 2021-05-10 NOTE — ED Notes (Signed)
Called [facility] and patient will be transport by [facility] Unit about 9 or 10 ?

## 2021-05-12 LAB — TB SKIN TEST: TB Skin Test: NEGATIVE

## 2021-05-13 MED ORDER — CARBIDOPA-LEVODOPA 25-100 MG PO TABS: 2.0000 | ORAL_TABLET | Freq: Four times a day (QID) | ORAL | 1 refills | Status: DC

## 2021-05-13 MED ORDER — ROPINIROLE HCL 2 MG PO TABS: 2.0000 mg | ORAL_TABLET | Freq: Every day | ORAL | 1 refills | Status: DC

## 2021-05-13 MED ORDER — METFORMIN HCL 500 MG PO TABS: 500.0000 mg | ORAL_TABLET | Freq: Two times a day (BID) | ORAL | 3 refills | Status: DC

## 2021-05-16 ENCOUNTER — Other Ambulatory Visit: Payer: Self-pay | Admitting: Internal Medicine

## 2021-05-19 ENCOUNTER — Telehealth: Payer: Self-pay

## 2021-05-19 NOTE — Telephone Encounter (Signed)
Adult Care Home FL2 form completed and faxed to [facility] (fax: [phone]).  Informed caregiver Dahlia Client that papers were faxed. ?

## 2021-05-25 ENCOUNTER — Ambulatory Visit: Payer: Medicare Other | Admitting: Nurse Practitioner

## 2021-05-26 ENCOUNTER — Ambulatory Visit: Payer: Medicare Other | Admitting: Podiatry

## 2021-06-07 ENCOUNTER — Telehealth: Payer: Self-pay

## 2021-06-07 NOTE — Telephone Encounter (Signed)
Brianne [phone]) from [facility]  called requesting verbal orders for PT ?2 x week for 2 weeks ?1 x week for 4 weeks ? ?I gave verbal approval for pt to receive the PT ?

## 2021-06-08 ENCOUNTER — Telehealth: Payer: Self-pay

## 2021-06-08 ENCOUNTER — Encounter: Payer: Self-pay | Admitting: Nurse Practitioner

## 2021-06-08 NOTE — Telephone Encounter (Signed)
Client coordination report for disease process education signed by [facility] and faxed back to [facility] at [phone]. ?

## 2021-06-08 NOTE — Telephone Encounter (Signed)
[facility] orders signed. Faxed back [phone] ?

## 2021-06-09 ENCOUNTER — Telehealth: Payer: Self-pay

## 2021-06-09 NOTE — Telephone Encounter (Signed)
Marylin Crosby [phone] from [facility] requesting verbal orders for plan of care nursing  and disease process management and skilled assessment   1 x week for 2 weeks 1 x every 2 weeks for 4 weeks  I gave verbal approval to do orders.

## 2021-06-16 ENCOUNTER — Encounter: Payer: Self-pay | Admitting: Nurse Practitioner

## 2021-06-18 ENCOUNTER — Other Ambulatory Visit: Payer: Self-pay | Admitting: Physician Assistant

## 2021-06-24 ENCOUNTER — Other Ambulatory Visit: Payer: Self-pay | Admitting: Nurse Practitioner

## 2021-06-24 DIAGNOSIS — B3731 Acute candidiasis of vulva and vagina: Secondary | ICD-10-CM

## 2021-07-07 ENCOUNTER — Telehealth: Payer: Self-pay

## 2021-07-07 NOTE — Telephone Encounter (Signed)
Plan of care signed by Dr.Khan and faxed back to [facility] at [phone]. Held in home health folder at the front desk.

## 2021-07-13 ENCOUNTER — Telehealth: Payer: Self-pay

## 2021-07-13 NOTE — Telephone Encounter (Signed)
Theresa Mills [phone]) from [facility] called and was requesting verbal orders to discharge patients PT due to the care plan goals were met.  I gave verbal order to discharge

## 2021-07-14 ENCOUNTER — Encounter: Payer: Self-pay | Admitting: Podiatry

## 2021-07-14 ENCOUNTER — Ambulatory Visit (INDEPENDENT_AMBULATORY_CARE_PROVIDER_SITE_OTHER): Payer: Medicare Other | Admitting: Podiatry

## 2021-07-14 DIAGNOSIS — B351 Tinea unguium: Secondary | ICD-10-CM | POA: Diagnosis not present

## 2021-07-14 DIAGNOSIS — E1142 Type 2 diabetes mellitus with diabetic polyneuropathy: Secondary | ICD-10-CM

## 2021-07-14 DIAGNOSIS — M2010 Hallux valgus (acquired), unspecified foot: Secondary | ICD-10-CM

## 2021-07-14 DIAGNOSIS — M2042 Other hammer toe(s) (acquired), left foot: Secondary | ICD-10-CM

## 2021-07-14 DIAGNOSIS — M79676 Pain in unspecified toe(s): Secondary | ICD-10-CM

## 2021-07-14 DIAGNOSIS — M2041 Other hammer toe(s) (acquired), right foot: Secondary | ICD-10-CM

## 2021-07-14 NOTE — Progress Notes (Signed)
This patient returns to my office for at risk foot care.  This patient requires this care by a professional since this patient will be at risk due to having type 2 diabetes.  This patient is unable to cut nails herself since the patient cannot reach her nails.These nails are painful walking and wearing shoes. Patient has not been seen in over  six months.  Patient is now in a wheelchair.. This patient presents for at risk foot care today.  General Appearance  Alert, conversant and in no acute stress.  Vascular  Dorsalis pedis and posterior tibial  pulses are palpable  bilaterally.  Capillary return is within normal limits  bilaterally. Temperature is within normal limits  bilaterally.  Neurologic  Senn-Weinstein monofilament wire test within normal limits  bilaterally. Muscle power within normal limits bilaterally.  Nails Thick disfigured discolored nails with subungual debris  from hallux to fifth toes bilaterally. No evidence of bacterial infection or drainage bilaterally.  Orthopedic  No limitations of motion  feet .  No crepitus or effusions noted.  No bony pathology or digital deformities noted.  HAV  B/L.  Contracted digits 2-5  B/L.  Skin  normotropic skin with no porokeratosis noted bilaterally.  No signs of infections or ulcers noted.  Pinch callus B/L.  Onychomycosis  Pain in right toes  Pain in left toes  Pinch callus  B/L  Consent was obtained for treatment procedures.   Mechanical debridement of nails 1-5  bilaterally performed with a nail nipper.  Filed with dremel without incident. Debride callus with # 15 blade and dremel tool.   Return office visit   12 weeks                  Told patient to return for periodic foot care and evaluation due to potential at risk complications.   Gardiner Barefoot DPM

## 2021-07-22 ENCOUNTER — Telehealth: Payer: Self-pay

## 2021-07-22 ENCOUNTER — Encounter: Payer: Self-pay | Admitting: Internal Medicine

## 2021-07-22 NOTE — Telephone Encounter (Signed)
[facility] order signed and faxed back [phone]

## 2021-07-22 NOTE — Telephone Encounter (Signed)
Home health order for wound care and plan of care signed and faxed back to [facility] at [phone].Held in home health folder at the front.

## 2021-07-22 NOTE — Telephone Encounter (Signed)
07/13/21 [facility] order signed and faxed back [phone]

## 2021-08-02 ENCOUNTER — Ambulatory Visit: Payer: Medicare Other | Admitting: Nurse Practitioner

## 2021-08-25 ENCOUNTER — Other Ambulatory Visit: Payer: Self-pay | Admitting: Internal Medicine

## 2021-08-25 DIAGNOSIS — E1165 Type 2 diabetes mellitus with hyperglycemia: Secondary | ICD-10-CM

## 2021-08-25 DIAGNOSIS — E039 Hypothyroidism, unspecified: Secondary | ICD-10-CM

## 2021-09-12 ENCOUNTER — Encounter: Payer: Self-pay | Admitting: Nurse Practitioner

## 2021-09-12 ENCOUNTER — Ambulatory Visit (INDEPENDENT_AMBULATORY_CARE_PROVIDER_SITE_OTHER): Payer: Medicare Other | Admitting: Nurse Practitioner

## 2021-09-12 VITALS — BP 125/70 | HR 72 | Temp 97.6°F | Resp 16 | Ht 63.0 in | Wt 197.8 lb

## 2021-09-12 DIAGNOSIS — Z760 Encounter for issue of repeat prescription: Secondary | ICD-10-CM | POA: Diagnosis not present

## 2021-09-12 DIAGNOSIS — E1165 Type 2 diabetes mellitus with hyperglycemia: Secondary | ICD-10-CM

## 2021-09-12 DIAGNOSIS — G20 Parkinson's disease: Secondary | ICD-10-CM

## 2021-09-12 DIAGNOSIS — R300 Dysuria: Secondary | ICD-10-CM | POA: Diagnosis not present

## 2021-09-12 DIAGNOSIS — Z0001 Encounter for general adult medical examination with abnormal findings: Secondary | ICD-10-CM

## 2021-09-12 DIAGNOSIS — E039 Hypothyroidism, unspecified: Secondary | ICD-10-CM

## 2021-09-12 DIAGNOSIS — G20B2 Parkinson's disease with dyskinesia, with fluctuations: Secondary | ICD-10-CM

## 2021-09-12 MED ORDER — METFORMIN HCL 500 MG PO TABS: 500.0000 mg | ORAL_TABLET | Freq: Two times a day (BID) | ORAL | 3 refills | Status: AC

## 2021-09-12 MED ORDER — SERTRALINE HCL 100 MG PO TABS: 100.0000 mg | ORAL_TABLET | Freq: Every day | ORAL | 3 refills | Status: AC

## 2021-09-12 MED ORDER — CARBIDOPA-LEVODOPA 25-100 MG PO TABS: 2.0000 | ORAL_TABLET | Freq: Four times a day (QID) | ORAL | 3 refills | Status: AC

## 2021-09-12 MED ORDER — ROPINIROLE HCL 2 MG PO TABS: 2.0000 mg | ORAL_TABLET | Freq: Every day | ORAL | 3 refills | Status: AC

## 2021-09-12 MED ORDER — OMEPRAZOLE 40 MG PO CPDR: DELAYED_RELEASE_CAPSULE | ORAL | 3 refills | Status: AC

## 2021-09-12 MED ORDER — MELATONIN 3 MG PO TABS: 3.0000 mg | ORAL_TABLET | Freq: Every evening | ORAL | 3 refills | Status: AC | PRN

## 2021-09-12 MED ORDER — SIMVASTATIN 40 MG PO TABS: ORAL_TABLET | ORAL | 3 refills | Status: AC

## 2021-09-12 MED ORDER — ONDANSETRON HCL 4 MG PO TABS: 4.0000 mg | ORAL_TABLET | Freq: Three times a day (TID) | ORAL | 5 refills | Status: AC | PRN

## 2021-09-12 MED ORDER — SYNTHROID 112 MCG PO TABS: 112.0000 ug | ORAL_TABLET | Freq: Every day | ORAL | 1 refills | Status: DC

## 2021-09-12 NOTE — Progress Notes (Signed)
[facility] [place], [place] [address]  Internal MEDICINE  Office Visit Note  Patient Name: Theresa Mills  [id number]  [id number]  Date of Service: 09/12/2021  Chief Complaint  Patient presents with   Medicare Wellness   Diabetes   Hyperlipidemia   Hypertension   Quality Metric Gaps    Tetanus, Pneumonia, Shingles, Mammogram    HPI Theresa Mills presents for an annual well visit and physical exam.  She is well-appearing 71 year old female with hypertension, diabetes, hypothyroidism, parkinson's disease with dementia, osteoarthritis, anxiety and depression.  Stayed at [facility] for a month, enjoyed some activities like bingo and reading books in [employer].  Sees endocrinology for hypothyroidism.  Her diabetes is well controlled, last A1C was 5.1 last year. Overdue for some labs.  Patient will need home health esp physical therapy continued    Current Medication: Outpatient Encounter Medications as of 09/12/2021  Medication Sig   acetaminophen (TYLENOL 8 HOUR) 650 MG CR tablet Take 650 mg by mouth every 8 (eight) hours as needed for pain.   calcitonin, salmon, (MIACALCIN) 200 UNIT/ACT nasal spray Place 1 spray into alternate nostrils daily.   Calcium Carbonate-Vitamin D 600-200 MG-UNIT TABS Take 1 tablet by mouth daily.    clotrimazole-betamethasone (LOTRISONE) cream APPLY 1 APPLICATION TOPICALLY 2 TIMES DAILY.   diclofenac Sodium (VOLTAREN) 1 % GEL Apply topically 4 (four) times daily. OTC   fexofenadine (ALLEGRA) 180 MG tablet Take 180 mg by mouth daily. OTC (over the counter)   glucose blood test strip TEST BLOOD SUGAR BID   Lidocaine 4 % PTCH Apply 1 patch topically daily.   Multiple Vitamins-Minerals (MULTIVITAMIN WOMEN 50+ PO) Take 1 tablet by mouth daily.    ONETOUCH DELICA LANCETS 95M MISC TEST BLOOD SUGAR BID   [DISCONTINUED] carbidopa-levodopa (SINEMET IR) 25-100 MG tablet Take 2 tablets by mouth 4 (four) times daily.    [DISCONTINUED] celecoxib (CELEBREX) 200 MG capsule TAKE 1 CAPSULE BY MOUTH TWO TIMES DAILY AS NEEDED   [DISCONTINUED] Melatonin 3 MG TABS Take 3 mg by mouth at bedtime as needed (sleep).    [DISCONTINUED] metFORMIN (GLUCOPHAGE) 500 MG tablet Take 1 tablet (500 mg total) by mouth 2 (two) times daily.   [DISCONTINUED] omeprazole (PRILOSEC) 40 MG capsule TAKE 1 CAPSULE BY MOUTH EVERY DAY   [DISCONTINUED] ondansetron (ZOFRAN) 4 MG tablet Take 4 mg by mouth every 8 (eight) hours as needed for nausea or vomiting.   [DISCONTINUED] rOPINIRole (REQUIP) 2 MG tablet Take 1 tablet (2 mg total) by mouth at bedtime.   [DISCONTINUED] sertraline (ZOLOFT) 100 MG tablet TAKE 1 TABLET BY MOUTH DAILY   [DISCONTINUED] simvastatin (ZOCOR) 40 MG tablet TAKE ONE TABLET BY MOUTH AT BEDTIME FOR CHOLESTEROL   [DISCONTINUED] SYNTHROID 112 MCG tablet Take 112 mcg by mouth daily.   carbidopa-levodopa (SINEMET IR) 25-100 MG tablet Take 2 tablets by mouth 4 (four) times daily.   melatonin 3 MG TABS tablet Take 1 tablet (3 mg total) by mouth at bedtime as needed (sleep).   metFORMIN (GLUCOPHAGE) 500 MG tablet Take 1 tablet (500 mg total) by mouth 2 (two) times daily.   omeprazole (PRILOSEC) 40 MG capsule TAKE 1 CAPSULE BY MOUTH EVERY DAY   ondansetron (ZOFRAN) 4 MG tablet Take 1 tablet (4 mg total) by mouth every 8 (eight) hours as needed for nausea or vomiting.   rOPINIRole (REQUIP) 2 MG tablet Take 1 tablet (2 mg total) by mouth at bedtime.   sertraline (ZOLOFT) 100  MG tablet Take 1 tablet (100 mg total) by mouth daily.   simvastatin (ZOCOR) 40 MG tablet TAKE ONE TABLET BY MOUTH AT BEDTIME FOR CHOLESTEROL   SYNTHROID 112 MCG tablet Take 1 tablet (112 mcg total) by mouth daily.   No facility-administered encounter medications on file as of 09/12/2021.    Surgical History: Past Surgical History:  Procedure Laterality Date   ABDOMINAL HYSTERECTOMY     CATARACT EXTRACTION Bilateral    CHOLECYSTECTOMY     COLONOSCOPY WITH  PROPOFOL N/A 04/02/2015   Procedure: COLONOSCOPY WITH PROPOFOL;  Surgeon: Josefine Class, MD;  Location: [facility] ENDOSCOPY;  Service: Endoscopy;  Laterality: N/A;   ESOPHAGOGASTRODUODENOSCOPY (EGD) WITH PROPOFOL N/A 04/02/2015   Procedure: ESOPHAGOGASTRODUODENOSCOPY (EGD) WITH PROPOFOL;  Surgeon: Josefine Class, MD;  Location: [facility] ENDOSCOPY;  Service: Endoscopy;  Laterality: N/A;   GALLBLADDER SURGERY     LAPAROSCOPIC HYSTERECTOMY     THYROIDECTOMY Left    TONSILLECTOMY     TONSILLECTOMY Bilateral     Medical History: Past Medical History:  Diagnosis Date   Anxiety    Diabetes ([place])    Difficult intravenous access    GERD (gastroesophageal reflux disease)    HBP (high blood pressure)    Hyperlipidemia    Osteoarthritis    Parkinson disease ([place])    Sleep apnea    Thyroid disease     Family History: Family History  Problem Relation Age of Onset   Asthma Father    Cancer Sister    Stroke Maternal Uncle    Heart disease Maternal Uncle    Diabetes Maternal Uncle     Social History   Socioeconomic History   Marital status: Widowed    Spouse name: Not on file   Number of children: Not on file   Years of education: Not on file   Highest education level: Not on file  Occupational History   Not on file  Tobacco Use   Smoking status: Never   Smokeless tobacco: Never  Vaping Use   Vaping Use: Never used  Substance and Sexual Activity   Alcohol use: No   Drug use: No   Sexual activity: Not on file  Other Topics Concern   Not on file  Social History Narrative   Not on file   Social Determinants of Health   Financial Resource Strain: Not on file  Food Insecurity: Not on file  Transportation Needs: Not on file  Physical Activity: Not on file  Stress: Not on file  Social Connections: Not on file  Intimate Partner Violence: Not on file      Review of Systems  Constitutional:  Negative for activity change, appetite change, chills, fatigue, fever and  unexpected weight change.  HENT: Negative.  Negative for congestion, ear pain, rhinorrhea, sore throat and trouble swallowing.   Eyes: Negative.   Respiratory: Negative.  Negative for cough, chest tightness, shortness of breath and wheezing.   Cardiovascular: Negative.  Negative for chest pain.  Gastrointestinal: Negative.  Negative for abdominal pain, blood in stool, constipation, diarrhea, nausea and vomiting.  Endocrine: Negative.   Genitourinary: Negative.  Negative for difficulty urinating, dysuria, frequency, hematuria and urgency.  Musculoskeletal:  Positive for arthralgias, back pain, gait problem, joint swelling, neck pain and neck stiffness. Negative for myalgias.  Skin: Negative.  Negative for rash and wound.  Allergic/Immunologic: Negative.  Negative for immunocompromised state.  Neurological:  Positive for tremors and weakness. Negative for dizziness, seizures, numbness and headaches.  Hematological: Negative.  Negative  for adenopathy. Does not bruise/bleed easily.  Psychiatric/Behavioral:  Positive for sleep disturbance. Negative for behavioral problems, self-injury and suicidal ideas. The patient is not nervous/anxious.     Vital Signs: BP 125/70   Pulse 72   Temp 97.6 F (36.4 C)   Resp 16   Ht '5\' 3"'$  (1.6 m)   Wt 197 lb 12.8 oz (89.7 kg)   SpO2 95%   BMI 35.04 kg/m    Physical Exam Vitals reviewed.  Constitutional:      General: She is awake. She is not in acute distress.    Appearance: Normal appearance. She is well-developed and well-groomed. She is obese. She is not ill-appearing.  HENT:     Head: Normocephalic and atraumatic.     Right Ear: Tympanic membrane, ear canal and external ear normal.     Left Ear: Tympanic membrane, ear canal and external ear normal.     Nose: Nose normal. No congestion or rhinorrhea.     Mouth/Throat:     Lips: Pink.     Mouth: Mucous membranes are moist.     Pharynx: Oropharynx is clear. Uvula midline. No oropharyngeal exudate  or posterior oropharyngeal erythema.  Eyes:     General: Lids are normal. Vision grossly intact. Gaze aligned appropriately.     Extraocular Movements: Extraocular movements intact.     Conjunctiva/sclera: Conjunctivae normal.     Pupils: Pupils are equal, round, and reactive to light.  Neck:     Vascular: No carotid bruit.     Trachea: Trachea and phonation normal.  Cardiovascular:     Rate and Rhythm: Normal rate and regular rhythm.     Pulses: Normal pulses.     Heart sounds: Normal heart sounds, S1 normal and S2 normal. No murmur heard. Pulmonary:     Effort: Pulmonary effort is normal. No accessory muscle usage or respiratory distress.     Breath sounds: Normal breath sounds and air entry. No wheezing.  Chest:     Comments: Declined clinical breast exam Abdominal:     General: Bowel sounds are normal. There is no distension.     Palpations: Abdomen is soft. There is no shifting dullness, fluid wave, mass or pulsatile mass.     Tenderness: There is no abdominal tenderness.  Musculoskeletal:     Cervical back: Normal range of motion and neck supple.     Right lower leg: 1+ Pitting Edema present.     Left lower leg: 1+ Pitting Edema present.  Lymphadenopathy:     Cervical: No cervical adenopathy.  Skin:    General: Skin is warm and dry.     Capillary Refill: Capillary refill takes less than 2 seconds.  Neurological:     Mental Status: She is alert. Mental status is at baseline.     Motor: Weakness and tremor present.     Gait: Gait abnormal.  Psychiatric:        Mood and Affect: Mood normal.        Behavior: Behavior normal. Behavior is cooperative.        Assessment/Plan: 1. Encounter for general adult medical examination with abnormal findings Age-appropriate preventive screenings and vaccinations discussed, annual physical exam completed. Routine labs not ordered, will most likely get labs at her next office visit with Endo. PHM updated.   2. Type 2 diabetes  mellitus with hyperglycemia, without long-term current use of insulin (HCC) Stable, no changes.   3. Parkinson disease ([place]) Currently on carbidopa-levodopa and ropinirole.   4.  Dysuria Routine urinalysis if possible - UA/M w/rflx Culture, Routine - Microscopic Examination - Urine Culture, Reflex  5. Medication refill - carbidopa-levodopa (SINEMET IR) 25-100 MG tablet; Take 2 tablets by mouth 4 (four) times daily.  Dispense: 720 tablet; Refill: 3 - rOPINIRole (REQUIP) 2 MG tablet; Take 1 tablet (2 mg total) by mouth at bedtime.  Dispense: 90 tablet; Refill: 3 - sertraline (ZOLOFT) 100 MG tablet; Take 1 tablet (100 mg total) by mouth daily.  Dispense: 90 tablet; Refill: 3 - simvastatin (ZOCOR) 40 MG tablet; TAKE ONE TABLET BY MOUTH AT BEDTIME FOR CHOLESTEROL  Dispense: 90 tablet; Refill: 3 - omeprazole (PRILOSEC) 40 MG capsule; TAKE 1 CAPSULE BY MOUTH EVERY DAY  Dispense: 90 capsule; Refill: 3 - ondansetron (ZOFRAN) 4 MG tablet; Take 1 tablet (4 mg total) by mouth every 8 (eight) hours as needed for nausea or vomiting.  Dispense: 20 tablet; Refill: 5 - metFORMIN (GLUCOPHAGE) 500 MG tablet; Take 1 tablet (500 mg total) by mouth 2 (two) times daily.  Dispense: 180 tablet; Refill: 3 - melatonin 3 MG TABS tablet; Take 1 tablet (3 mg total) by mouth at bedtime as needed (sleep).  Dispense: 90 tablet; Refill: 3      General Counseling: Sonnia verbalizes understanding of the findings of todays visit and agrees with plan of treatment. I have discussed any further diagnostic evaluation that may be needed or ordered today. We also reviewed her medications today. she has been encouraged to call the office with any questions or concerns that should arise related to todays visit.    Orders Placed This Encounter  Procedures   UA/M w/rflx Culture, Routine    Meds ordered this encounter  Medications   carbidopa-levodopa (SINEMET IR) 25-100 MG tablet    Sig: Take 2 tablets by mouth 4 (four) times  daily.    Dispense:  720 tablet    Refill:  3    FOR FUTURE REILLS **BUBBLE PACK PT**   rOPINIRole (REQUIP) 2 MG tablet    Sig: Take 1 tablet (2 mg total) by mouth at bedtime.    Dispense:  90 tablet    Refill:  3    FOR FUTURE REFILLS **BUBBLE PACK PT**   sertraline (ZOLOFT) 100 MG tablet    Sig: Take 1 tablet (100 mg total) by mouth daily.    Dispense:  90 tablet    Refill:  3    PT NEEDS REFILLS FOR BUBBLE PACKS!!! **BUBBLE PACK DUE MMONDAY 4/17   simvastatin (ZOCOR) 40 MG tablet    Sig: TAKE ONE TABLET BY MOUTH AT BEDTIME FOR CHOLESTEROL    Dispense:  90 tablet    Refill:  3    PT NEEDS REFILLS **BUBBLE PACK PT**   SYNTHROID 112 MCG tablet    Sig: Take 1 tablet (112 mcg total) by mouth daily.    Dispense:  90 tablet    Refill:  1   omeprazole (PRILOSEC) 40 MG capsule    Sig: TAKE 1 CAPSULE BY MOUTH EVERY DAY    Dispense:  90 capsule    Refill:  3    FOR FUTURE REFILLS **BUBBLE PACK PT**   ondansetron (ZOFRAN) 4 MG tablet    Sig: Take 1 tablet (4 mg total) by mouth every 8 (eight) hours as needed for nausea or vomiting.    Dispense:  20 tablet    Refill:  5   metFORMIN (GLUCOPHAGE) 500 MG tablet    Sig: Take 1 tablet (500 mg total) by mouth  2 (two) times daily.    Dispense:  180 tablet    Refill:  3    FOR FUTURE REFILLS **BUBBLE PACK PT**   melatonin 3 MG TABS tablet    Sig: Take 1 tablet (3 mg total) by mouth at bedtime as needed (sleep).    Dispense:  90 tablet    Refill:  3    Return in about 4 months (around 01/12/2022) for F/U , Lanasia Porras PCP and need nurse visit in october for flu shot..   Total time spent:30 Minutes Time spent includes review of chart, medications, test results, and follow up plan with the patient.   [place] Controlled Substance Database was reviewed by me.  This patient was seen by Jonetta Osgood, FNP-C in collaboration with Dr. Clayborn Bigness as a part of collaborative care agreement.  Yousuf Ager R. Valetta Fuller, MSN, FNP-C Internal medicine

## 2021-09-15 LAB — UA/M W/RFLX CULTURE, ROUTINE
Bilirubin, UA: NEGATIVE
Glucose, UA: NEGATIVE
Nitrite, UA: NEGATIVE
RBC, UA: NEGATIVE
Specific Gravity, UA: 1.026 (ref 1.005–1.030)
Urobilinogen, Ur: 0.2 mg/dL (ref 0.2–1.0)
pH, UA: 6 (ref 5.0–7.5)

## 2021-09-15 LAB — MICROSCOPIC EXAMINATION
Casts: NONE SEEN /lpf
Epithelial Cells (non renal): >10 /hpf — AB (ref 0–10)
WBC, UA: >30 /hpf — AB (ref 0–5)

## 2021-09-15 LAB — URINE CULTURE, REFLEX

## 2021-09-22 ENCOUNTER — Other Ambulatory Visit: Payer: Self-pay | Admitting: Physician Assistant

## 2021-10-06 IMAGING — MR MR LUMBAR SPINE WO/W CM
6 of 9 series · 28 of 48 positions shown · IV contrast (gadavist)
Comparison: Prior MRI from 08/28/2019.

CLINICAL DATA: Initial evaluation for worsening lower back pain,
history of osteomyelitis discitis.

EXAM:
MRI LUMBAR SPINE WITHOUT AND WITH CONTRAST
TECHNIQUE: Multiplanar and multiecho pulse sequences of the lumbar spine were
obtained without and with intravenous contrast.
CONTRAST:  5mL GADAVIST GADOBUTROL 1 MMOL/ML IV SOLN

[Series 6: T2 · sagittal · 4.0mm · 0.81mm/px · 4 of 19 slices shown (1 of 2)]
[im 1/19]
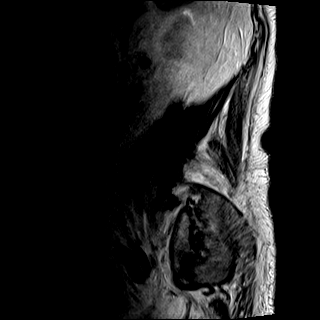
[im 7/19]
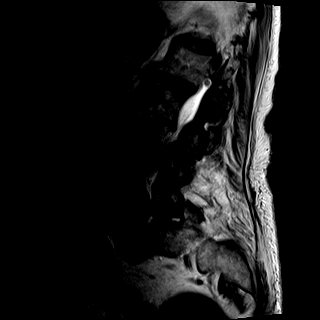
[im 13/19]
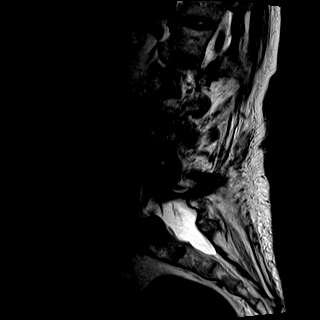
[im 19/19]
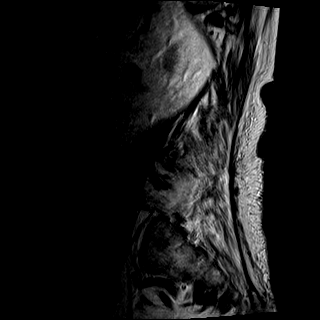

[Series 7: T1 · sagittal · 4.0mm · 0.81mm/px · 4 of 19 slices shown (1 of 4)]
[im 1/19]
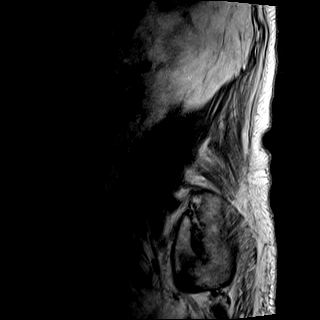
[im 7/19]
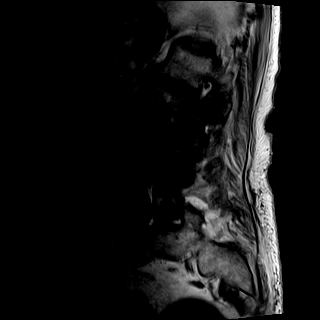
[im 13/19]
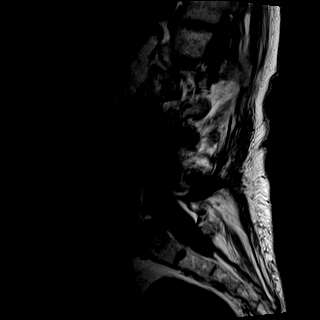
[im 19/19]
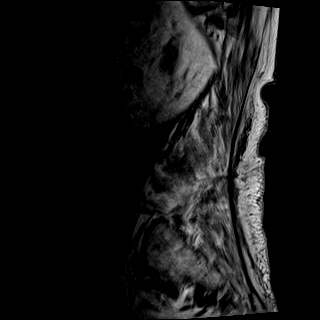

[Series 9: T1 · sagittal · 4.0mm · 1.02mm/px · 4 of 19 slices shown (2 of 4)]
[im 1/19]
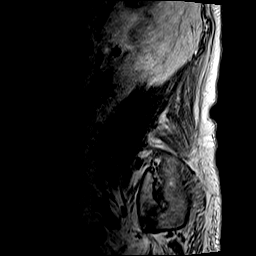
[im 7/19]
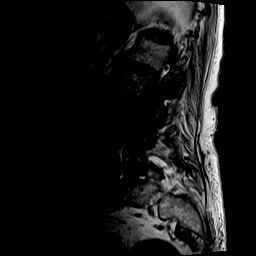
[im 13/19]
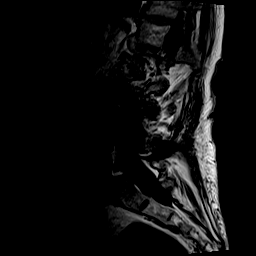
[im 19/19]
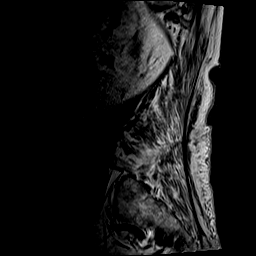

[Series 10: T2 · axial · 4.0mm · 0.78mm/px · z∈[+11,+174]mm · 7 of 32 slices shown (2 of 2)]
[im 1/32]
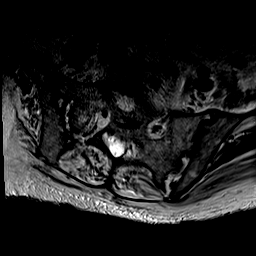
[im 6/32]
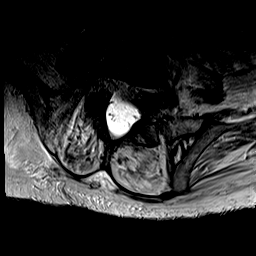
[im 11/32]
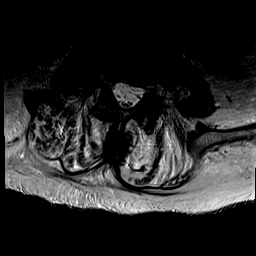
[im 16/32]
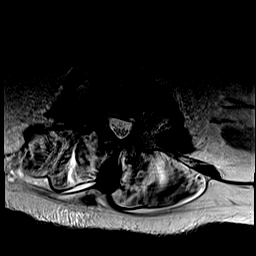
[im 21/32]
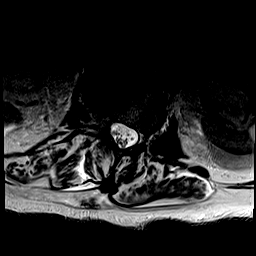
[im 26/32]
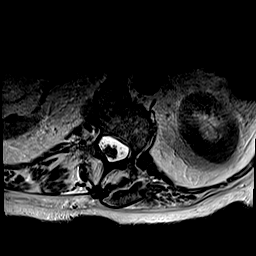
[im 32/32]
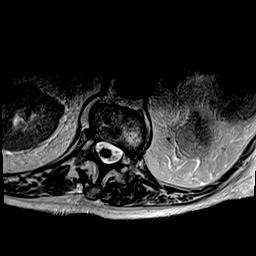

[Series 11: T1 · axial · 4.0mm · 0.39mm/px · z∈[+11,+174]mm · 7 of 32 slices shown (3 of 4)]
[im 1/32]
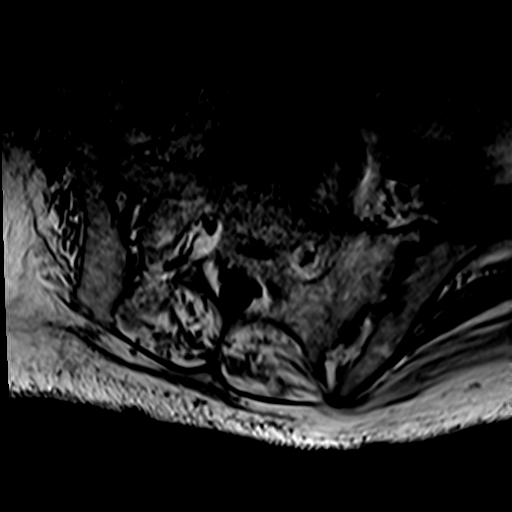
[im 6/32]
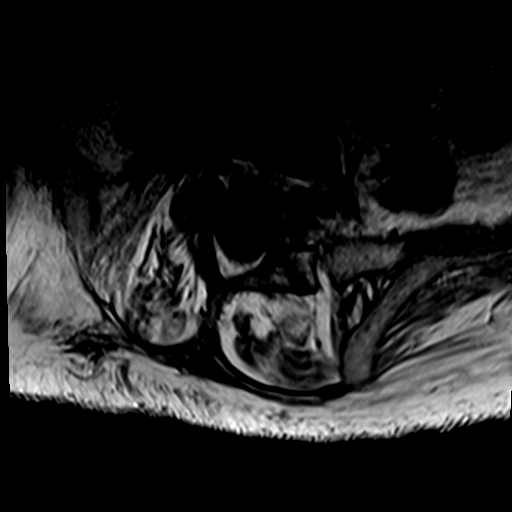
[im 11/32]
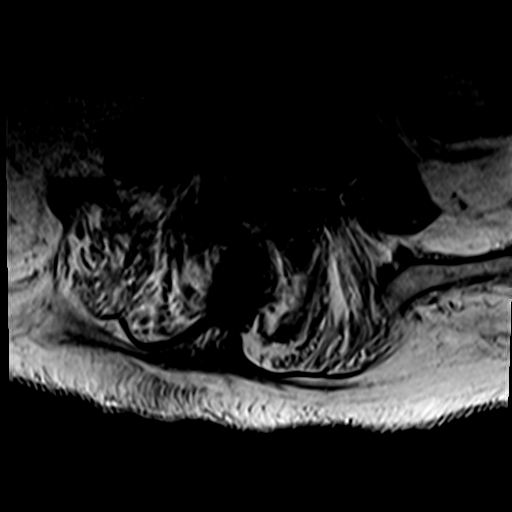
[im 16/32]
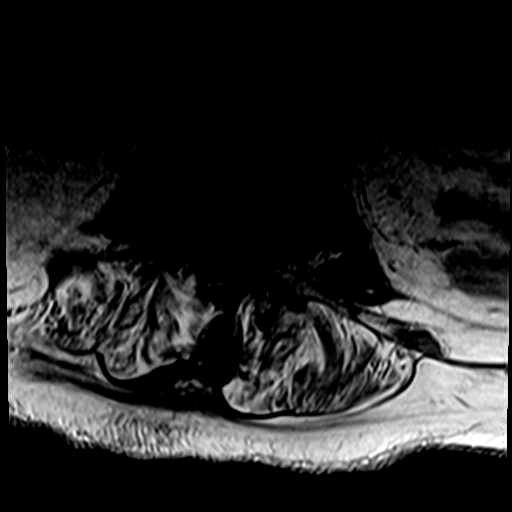
[im 21/32]
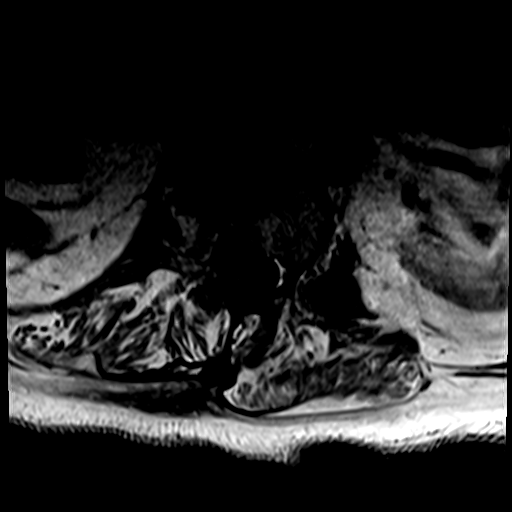
[im 26/32]
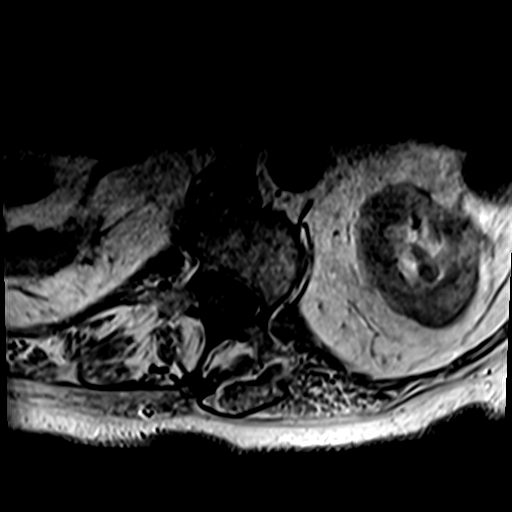
[im 32/32]
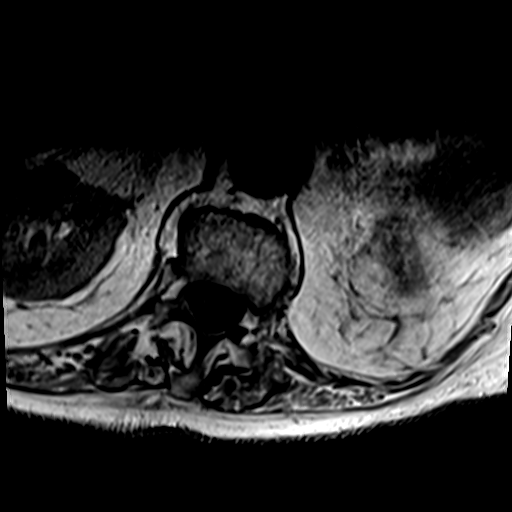

[Series 12: T1 · axial · 4.0mm · 0.39mm/px · z∈[+11,+40]mm · 2 of 32 slices shown (4 of 4)]
[im 1/32]
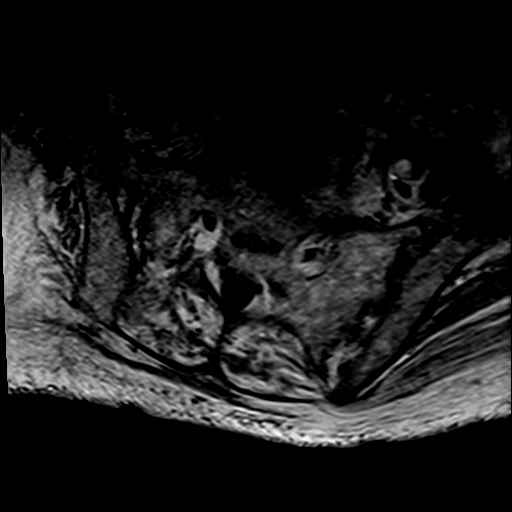
[im 6/32]
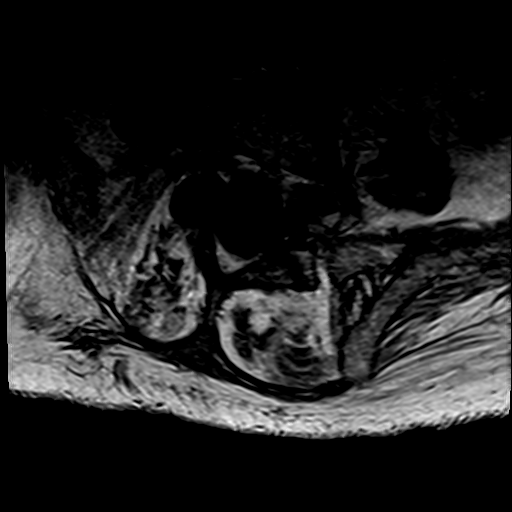

[28 of 48 positions shown; findings below may reference images not displayed]

FINDINGS: Segmentation:  Standard.

Alignment: Moderate levoscoliosis. Chronic bilateral pars defects at
L5 with associated 12 mm spondylolisthesis. Trace 3 mm
retrolisthesis of L1 on L2. Appearance is stable from prior.

Vertebrae: Persistent changes consistent with osteomyelitis discitis
seen centered about the L3-4 interspace. Persistent abnormal marrow
edema and enhancement seen within the L3 and L4 vertebral bodies,
with fluid signal intensity and enhancement within the intervening
L3-4 disc space. Superimposed compression deformities involving the
inferior endplate of L3 and superior endplate of L4 again noted with
similar height loss, although endplate irregularity has progressed
from prior. Abnormal edema and enhancement again seen extending into
the adjacent right greater than left psoas musculature. Eight
pigtail drainage catheter previously positioned within the right
psoas muscle has been removed. There is a residual small curvilinear
collection involving the right psoas muscle at the level of L3-4,
measuring up to 1.7 cm in greatest size (series 10, images 24-22).
Abnormal edema and enhancement seen about the bilateral L3-4 facets,
concerning for associated septic arthritis, right worse than left.
Associated edema and enhancement within the adjacent posterior
paraspinous soft tissues without definite discrete or drainable
fluid collection. Irregular epidural phlegmon/enhancement seen
involving the epidural space in a fairly circumferential fashion at
the level of L3-4. No definite loculated or discrete epidural
abscess now seen on today's exam.

Mild chronic compression deformity involving the superior endplate
of T12 again seen, with no associated marrow edema seen on today's
exam, consistent with a chronic fracture. Otherwise, vertebral body
height maintained. Underlying bone marrow signal intensity within
normal limits. Multiple scattered benign hemangiomata again noted.
No worrisome osseous lesions.

Conus medullaris and cauda equina: Conus extends to the L2-3 level.
Conus and cauda equina appear normal.

Paraspinal and other soft tissues: Bilateral psoas edema and
enhancement adjacent to the L3-4 interspace, consistent with
associated myositis. Small residual right psoas collection as above.
No definite loculated left-sided collections now seen

Disc levels:

L1-2: Diffuse disc bulge, eccentric to the right, with prominent
right-sided reactive endplate spurring. Resultant mild narrowing of
the right lateral recess. Central canal remains patent. No
significant foraminal stenosis. Appearance is stable.

L2-3: Advanced degenerative intervertebral disc space narrowing with
diffuse disc bulge, asymmetric to the right. Prominent right-sided
reactive endplate changes. Moderate bilateral facet hypertrophy.
Resultant moderate right lateral recess narrowing with moderate
right foraminal stenosis. Mild to moderate left lateral recess
stenosis with mild left foraminal narrowing noted as well. Overall,
appearance is relatively stable.

L3-4: Findings consistent with osteomyelitis discitis with abnormal
edema, enhancement, and endplate irregularity about the L3-4
interspace. Superimposed changes suspicious for right greater than
left septic arthritis present as well. Irregular circumferential
epidural phlegmon and enhancement without definite discrete epidural
abscess. Resultant moderate spinal stenosis with moderate to severe
bilateral lateral recess narrowing, worsened from previous. Epidural
phlegmon and enhancement extends to involve the bilateral neural
foramina with associated moderate to severe bilateral foraminal
stenosis, also worsened.

L4-5: Disc desiccation with mild left far lateral disc bulge. Mild
facet hypertrophy. No significant stenosis.

L5-S1: Chronic bilateral pars defects with associated 12 mm
spondylolisthesis. Associated broad posterior pseudo disc
bulge/uncovering. Moderate left greater than right facet
degeneration. No spinal stenosis. Moderate left L5 foraminal
narrowing again noted. Right neural foramen remains patent.
IMPRESSION: 1. Persistent findings consistent with osteomyelitis discitis
centered about the L3-4 interspace. Associated abnormal enhancement
within the adjacent paraspinous soft tissues, with residual 1.7 cm
right psoas collection/abscess. Associated epidural
phlegmon/enhancement with no definite discrete epidural abscess now
visualized. Associated moderate to severe spinal stenosis with
bilateral lateral recess and foraminal narrowing at this level has
progressed and worsened from previous.
2. Abnormal edema and enhancement about the bilateral L3-4 facets,
concerning for associated septic arthritis, right worse than left.
3. Underlying fracture deformities involving the inferior endplate
of L3 and superior endplate of L4, similar to previous, although
associated endplate irregularity related to underlying infection has
progressed since prior.
4. Chronic bilateral pars defects at L5-S1 with associated 12 mm
spondylolisthesis, with moderate left L5 foraminal stenosis.

## 2021-10-17 ENCOUNTER — Ambulatory Visit: Payer: Medicare Other | Admitting: Nurse Practitioner

## 2021-10-20 ENCOUNTER — Emergency Department: Payer: Medicare Other

## 2021-10-20 ENCOUNTER — Encounter: Payer: Self-pay | Admitting: Nurse Practitioner

## 2021-10-20 ENCOUNTER — Other Ambulatory Visit: Payer: Self-pay

## 2021-10-20 ENCOUNTER — Ambulatory Visit: Payer: Medicare Other | Admitting: Podiatry

## 2021-10-20 ENCOUNTER — Ambulatory Visit (INDEPENDENT_AMBULATORY_CARE_PROVIDER_SITE_OTHER): Payer: Medicare Other | Admitting: Nurse Practitioner

## 2021-10-20 ENCOUNTER — Inpatient Hospital Stay
Admission: EM | Admit: 2021-10-20 | Discharge: 2021-10-27 | DRG: 690 | Disposition: A | Discharge status: Skilled Nursing Facility | Payer: Medicare Other | Attending: Internal Medicine | Admitting: Internal Medicine

## 2021-10-20 VITALS — BP 127/88 | HR 88 | Temp 98.1°F | Resp 16 | Ht 63.0 in | Wt 190.8 lb

## 2021-10-20 DIAGNOSIS — R5381 Other malaise: Secondary | ICD-10-CM

## 2021-10-20 DIAGNOSIS — F419 Anxiety disorder, unspecified: Secondary | ICD-10-CM | POA: Diagnosis present

## 2021-10-20 DIAGNOSIS — Z6833 Body mass index (BMI) 33.0-33.9, adult: Secondary | ICD-10-CM

## 2021-10-20 DIAGNOSIS — G20 Parkinson's disease: Secondary | ICD-10-CM

## 2021-10-20 DIAGNOSIS — E039 Hypothyroidism, unspecified: Secondary | ICD-10-CM | POA: Diagnosis present

## 2021-10-20 DIAGNOSIS — K219 Gastro-esophageal reflux disease without esophagitis: Secondary | ICD-10-CM | POA: Diagnosis present

## 2021-10-20 DIAGNOSIS — M159 Polyosteoarthritis, unspecified: Secondary | ICD-10-CM | POA: Diagnosis not present

## 2021-10-20 DIAGNOSIS — R296 Repeated falls: Secondary | ICD-10-CM | POA: Diagnosis not present

## 2021-10-20 DIAGNOSIS — R262 Difficulty in walking, not elsewhere classified: Secondary | ICD-10-CM | POA: Diagnosis present

## 2021-10-20 DIAGNOSIS — Y92009 Unspecified place in unspecified non-institutional (private) residence as the place of occurrence of the external cause: Secondary | ICD-10-CM | POA: Diagnosis not present

## 2021-10-20 DIAGNOSIS — Z7984 Long term (current) use of oral hypoglycemic drugs: Secondary | ICD-10-CM

## 2021-10-20 DIAGNOSIS — G20B2 Parkinson's disease with dyskinesia, with fluctuations: Secondary | ICD-10-CM

## 2021-10-20 DIAGNOSIS — S63519A Sprain of carpal joint of unspecified wrist, initial encounter: Secondary | ICD-10-CM

## 2021-10-20 DIAGNOSIS — G8929 Other chronic pain: Secondary | ICD-10-CM | POA: Diagnosis present

## 2021-10-20 DIAGNOSIS — Z885 Allergy status to narcotic agent status: Secondary | ICD-10-CM

## 2021-10-20 DIAGNOSIS — I441 Atrioventricular block, second degree: Secondary | ICD-10-CM | POA: Diagnosis present

## 2021-10-20 DIAGNOSIS — I10 Essential (primary) hypertension: Secondary | ICD-10-CM | POA: Diagnosis present

## 2021-10-20 DIAGNOSIS — M549 Dorsalgia, unspecified: Secondary | ICD-10-CM | POA: Diagnosis present

## 2021-10-20 DIAGNOSIS — Z79899 Other long term (current) drug therapy: Secondary | ICD-10-CM

## 2021-10-20 DIAGNOSIS — E119 Type 2 diabetes mellitus without complications: Secondary | ICD-10-CM | POA: Diagnosis present

## 2021-10-20 DIAGNOSIS — R001 Bradycardia, unspecified: Secondary | ICD-10-CM | POA: Diagnosis present

## 2021-10-20 DIAGNOSIS — R531 Weakness: Secondary | ICD-10-CM | POA: Diagnosis not present

## 2021-10-20 DIAGNOSIS — Z79890 Hormone replacement therapy (postmenopausal): Secondary | ICD-10-CM

## 2021-10-20 DIAGNOSIS — Z8249 Family history of ischemic heart disease and other diseases of the circulatory system: Secondary | ICD-10-CM

## 2021-10-20 DIAGNOSIS — N390 Urinary tract infection, site not specified: Principal | ICD-10-CM | POA: Diagnosis present

## 2021-10-20 DIAGNOSIS — Z888 Allergy status to other drugs, medicaments and biological substances status: Secondary | ICD-10-CM

## 2021-10-20 DIAGNOSIS — B9689 Other specified bacterial agents as the cause of diseases classified elsewhere: Secondary | ICD-10-CM

## 2021-10-20 DIAGNOSIS — R8271 Bacteriuria: Secondary | ICD-10-CM

## 2021-10-20 DIAGNOSIS — L89152 Pressure ulcer of sacral region, stage 2: Secondary | ICD-10-CM | POA: Diagnosis present

## 2021-10-20 DIAGNOSIS — G20A1 Parkinson's disease without dyskinesia, without mention of fluctuations: Secondary | ICD-10-CM | POA: Diagnosis present

## 2021-10-20 DIAGNOSIS — Z66 Do not resuscitate: Secondary | ICD-10-CM | POA: Diagnosis present

## 2021-10-20 DIAGNOSIS — W19XXXA Unspecified fall, initial encounter: Secondary | ICD-10-CM | POA: Diagnosis present

## 2021-10-20 DIAGNOSIS — E785 Hyperlipidemia, unspecified: Secondary | ICD-10-CM | POA: Diagnosis present

## 2021-10-20 DIAGNOSIS — S63502A Unspecified sprain of left wrist, initial encounter: Secondary | ICD-10-CM | POA: Diagnosis present

## 2021-10-20 DIAGNOSIS — F32A Depression, unspecified: Secondary | ICD-10-CM | POA: Diagnosis present

## 2021-10-20 DIAGNOSIS — B961 Klebsiella pneumoniae [K. pneumoniae] as the cause of diseases classified elsewhere: Secondary | ICD-10-CM | POA: Diagnosis present

## 2021-10-20 DIAGNOSIS — M810 Age-related osteoporosis without current pathological fracture: Secondary | ICD-10-CM | POA: Diagnosis present

## 2021-10-20 DIAGNOSIS — G4730 Sleep apnea, unspecified: Secondary | ICD-10-CM | POA: Diagnosis present

## 2021-10-20 DIAGNOSIS — Z87440 Personal history of urinary (tract) infections: Secondary | ICD-10-CM

## 2021-10-20 DIAGNOSIS — Z833 Family history of diabetes mellitus: Secondary | ICD-10-CM

## 2021-10-20 DIAGNOSIS — E669 Obesity, unspecified: Secondary | ICD-10-CM | POA: Diagnosis present

## 2021-10-20 DIAGNOSIS — S63519S Sprain of carpal joint of unspecified wrist, sequela: Secondary | ICD-10-CM

## 2021-10-20 LAB — URINALYSIS, ROUTINE W REFLEX MICROSCOPIC
Bilirubin Urine: NEGATIVE
Glucose, UA: NEGATIVE mg/dL
Hgb urine dipstick: NEGATIVE
Ketones, ur: 5 mg/dL — AB
Nitrite: NEGATIVE
Protein, ur: NEGATIVE mg/dL
Specific Gravity, Urine: 1.025 (ref 1.005–1.030)
pH: 6 (ref 5.0–8.0)

## 2021-10-20 LAB — CBG MONITORING, ED
Glucose-Capillary: 91 mg/dL (ref 70–99)
Glucose-Capillary: 115 mg/dL — ABNORMAL HIGH (ref 70–99)

## 2021-10-20 LAB — CBC
HCT: 41.6 % (ref 36.0–46.0)
Hemoglobin: 13 g/dL (ref 12.0–15.0)
MCH: 30.9 pg (ref 26.0–34.0)
MCHC: 31.3 g/dL (ref 30.0–36.0)
MCV: 98.8 fL (ref 80.0–100.0)
Platelets: 216 10*3/uL (ref 150–400)
RBC: 4.21 MIL/uL (ref 3.87–5.11)
RDW: 12.5 % (ref 11.5–15.5)
WBC: 9 10*3/uL (ref 4.0–10.5)
nRBC: 0 % (ref 0.0–0.2)

## 2021-10-20 LAB — BASIC METABOLIC PANEL
Anion gap: 11 (ref 5–15)
BUN: 27 mg/dL — ABNORMAL HIGH (ref 8–23)
CO2: 27 mmol/L (ref 22–32)
Calcium: 9.5 mg/dL (ref 8.9–10.3)
Chloride: 106 mmol/L (ref 98–111)
Creatinine, Ser: 0.61 mg/dL (ref 0.44–1.00)
GFR, Estimated: >60 mL/min (ref >60)
Glucose, Bld: 84 mg/dL (ref 70–99)
Potassium: 3.9 mmol/L (ref 3.5–5.1)
Sodium: 144 mmol/L (ref 135–145)

## 2021-10-20 LAB — T4, FREE: Free T4: 0.99 ng/dL (ref 0.61–1.12)

## 2021-10-20 LAB — TSH: TSH: 7.503 u[IU]/mL — ABNORMAL HIGH (ref 0.350–4.500)

## 2021-10-20 MED ORDER — KETOROLAC TROMETHAMINE 15 MG/ML IJ SOLN: 15.0000 mg | Freq: Once | INTRAMUSCULAR | Status: AC

## 2021-10-20 MED ORDER — SERTRALINE HCL 50 MG PO TABS
100.0000 mg | ORAL_TABLET | Freq: Every day | ORAL | Status: DC
  Administered 2021-10-21 – 2021-10-27 (×7): 100 mg via ORAL
  Filled 2021-10-20 (×7): qty 2

## 2021-10-20 MED ORDER — ACETAMINOPHEN 650 MG RE SUPP: 650.0000 mg | Freq: Four times a day (QID) | RECTAL | Status: DC | PRN

## 2021-10-20 MED ORDER — ONDANSETRON HCL 4 MG/2ML IJ SOLN
4.0000 mg | Freq: Four times a day (QID) | INTRAMUSCULAR | Status: DC | PRN
  Administered 2021-10-20 – 2021-10-23 (×3): 4 mg via INTRAVENOUS
  Filled 2021-10-20 (×3): qty 2

## 2021-10-20 MED ORDER — ENOXAPARIN SODIUM 40 MG/0.4ML IJ SOSY
40.0000 mg | PREFILLED_SYRINGE | INTRAMUSCULAR | Status: DC
  Administered 2021-10-20 – 2021-10-26 (×7): 40 mg via SUBCUTANEOUS
  Filled 2021-10-20 (×7): qty 0.4

## 2021-10-20 MED ORDER — OXYCODONE-ACETAMINOPHEN 5-325 MG PO TABS
1.0000 | ORAL_TABLET | Freq: Once | ORAL | Status: AC
  Administered 2021-10-20: 1 via ORAL
  Filled 2021-10-20: qty 1

## 2021-10-20 MED ORDER — LEVOTHYROXINE SODIUM 125 MCG PO TABS
125.0000 ug | ORAL_TABLET | Freq: Every day | ORAL | Status: DC
  Administered 2021-10-21 – 2021-10-22 (×2): 125 ug via ORAL
  Filled 2021-10-20 (×2): qty 1

## 2021-10-20 MED ORDER — MELATONIN 5 MG PO TABS
5.0000 mg | ORAL_TABLET | Freq: Every evening | ORAL | Status: DC | PRN
  Administered 2021-10-21 – 2021-10-26 (×5): 5 mg via ORAL
  Filled 2021-10-20 (×5): qty 1

## 2021-10-20 MED ORDER — ONDANSETRON 4 MG PO TBDP
4.0000 mg | ORAL_TABLET | Freq: Once | ORAL | Status: AC
  Administered 2021-10-20: 4 mg via ORAL
  Filled 2021-10-20: qty 1

## 2021-10-20 MED ORDER — PANTOPRAZOLE SODIUM 40 MG PO TBEC
40.0000 mg | DELAYED_RELEASE_TABLET | Freq: Every day | ORAL | Status: DC
  Administered 2021-10-21 – 2021-10-27 (×7): 40 mg via ORAL
  Filled 2021-10-20 (×7): qty 1

## 2021-10-20 MED ORDER — OYSTER SHELL CALCIUM/D3 500-5 MG-MCG PO TABS
1.0000 | ORAL_TABLET | Freq: Every day | ORAL | Status: DC
  Administered 2021-10-21 – 2021-10-27 (×8): 1 via ORAL
  Filled 2021-10-20 (×8): qty 1

## 2021-10-20 MED ORDER — SENNOSIDES-DOCUSATE SODIUM 8.6-50 MG PO TABS
1.0000 | ORAL_TABLET | Freq: Every evening | ORAL | Status: DC | PRN
  Administered 2021-10-26: 1 via ORAL
  Filled 2021-10-20: qty 1

## 2021-10-20 MED ORDER — MORPHINE SULFATE (PF) 2 MG/ML IV SOLN
2.0000 mg | INTRAVENOUS | Status: DC | PRN
  Administered 2021-10-20: 2 mg via INTRAVENOUS
  Filled 2021-10-20 (×2): qty 1

## 2021-10-20 MED ORDER — ONDANSETRON HCL 4 MG PO TABS
4.0000 mg | ORAL_TABLET | Freq: Four times a day (QID) | ORAL | Status: DC | PRN
  Administered 2021-10-21 – 2021-10-25 (×2): 4 mg via ORAL
  Filled 2021-10-20 (×2): qty 1

## 2021-10-20 MED ORDER — ACETAMINOPHEN 325 MG PO TABS
650.0000 mg | ORAL_TABLET | Freq: Four times a day (QID) | ORAL | Status: DC | PRN
  Administered 2021-10-21: 650 mg via ORAL
  Filled 2021-10-20: qty 2

## 2021-10-20 MED ORDER — FOSFOMYCIN TROMETHAMINE 3 G PO PACK
3.0000 g | PACK | Freq: Once | ORAL | Status: AC
  Administered 2021-10-20: 3 g via ORAL
  Filled 2021-10-20: qty 3

## 2021-10-20 MED ORDER — ROPINIROLE HCL 1 MG PO TABS
2.0000 mg | ORAL_TABLET | Freq: Every day | ORAL | Status: DC
  Administered 2021-10-21 – 2021-10-26 (×7): 2 mg via ORAL
  Filled 2021-10-20 (×7): qty 2

## 2021-10-20 MED ORDER — CARBIDOPA-LEVODOPA 25-100 MG PO TABS
2.0000 | ORAL_TABLET | Freq: Four times a day (QID) | ORAL | Status: DC
  Administered 2021-10-21 – 2021-10-27 (×27): 2 via ORAL
  Filled 2021-10-20 (×27): qty 2

## 2021-10-20 MED ORDER — SIMVASTATIN 20 MG PO TABS
40.0000 mg | ORAL_TABLET | Freq: Every day | ORAL | Status: DC
  Administered 2021-10-21 – 2021-10-26 (×6): 40 mg via ORAL
  Filled 2021-10-20 (×5): qty 4
  Filled 2021-10-20: qty 2
  Filled 2021-10-20: qty 4

## 2021-10-20 MED ORDER — INSULIN ASPART 100 UNIT/ML IJ SOLN
0.0000 [IU] | Freq: Three times a day (TID) | INTRAMUSCULAR | Status: DC
  Administered 2021-10-21: 2 [IU] via SUBCUTANEOUS
  Filled 2021-10-20: qty 1

## 2021-10-20 MED ORDER — METFORMIN HCL 500 MG PO TABS
500.0000 mg | ORAL_TABLET | Freq: Two times a day (BID) | ORAL | Status: DC
  Administered 2021-10-21 – 2021-10-27 (×13): 500 mg via ORAL
  Filled 2021-10-20 (×13): qty 1

## 2021-10-20 MED ORDER — CELECOXIB 200 MG PO CAPS
200.0000 mg | ORAL_CAPSULE | Freq: Two times a day (BID) | ORAL | Status: DC | PRN
  Administered 2021-10-26: 200 mg via ORAL
  Filled 2021-10-20 (×2): qty 1

## 2021-10-20 MED ORDER — INSULIN ASPART 100 UNIT/ML IJ SOLN: 0.0000 [IU] | Freq: Every day | INTRAMUSCULAR | Status: DC

## 2021-10-20 MED ORDER — ADULT MULTIVITAMIN W/MINERALS CH
1.0000 | ORAL_TABLET | Freq: Every day | ORAL | Status: DC
  Administered 2021-10-21 – 2021-10-27 (×7): 1 via ORAL
  Filled 2021-10-20 (×7): qty 1

## 2021-10-20 MED ORDER — KETOROLAC TROMETHAMINE 15 MG/ML IJ SOLN
INTRAMUSCULAR | Status: AC
  Administered 2021-10-20: 15 mg via INTRAVENOUS
  Filled 2021-10-20: qty 1

## 2021-10-20 NOTE — ED Notes (Signed)
First Nurse Note: Patient to ED via [facility] from PCP [facility]) for increased weakness. Patient has been having multiple falls with the last one being on Saturday. Hx of parkinson's and arthritis.   126/69 HR 83 96%

## 2021-10-20 NOTE — Assessment & Plan Note (Deleted)
Continue levothyroxine 

## 2021-10-20 NOTE — ED Triage Notes (Signed)
Pt with hx of Parkinsons, pt has been falling more recently, last fall this past Saturday. MD sent pt over due to pt having increased falls and decreased strength. Pt states she hit left elbow and forearm on Saturday and her left calf was also injured.

## 2021-10-20 NOTE — Assessment & Plan Note (Signed)
Continue Zoloft 

## 2021-10-20 NOTE — ED Provider Triage Note (Signed)
Emergency Medicine Provider Triage Evaluation Note  Theresa Mills , a 71 y.o. female  was evaluated in triage.  Pt complains of multiple falls.  History of Parkinson's.  Leg and forearm injury.  Sent by her doctor due to increased weakness..  Review of Systems  Positive: Multiple falls Negative:   Physical Exam  BP 129/72 (BP Location: Right Arm)   Pulse 71   Temp 98.3 F (36.8 C) (Oral)   Resp 18   SpO2 93%  Gen:   Awake, no distress   Resp:  Normal effort  MSK:   Moves extremities without difficulty  Other:    Medical Decision Making  Medically screening exam initiated at 1:09 PM.  Appropriate orders placed.  Theresa Mills was informed that the remainder of the evaluation will be completed by another provider, this initial triage assessment does not replace that evaluation, and the importance of remaining in the ED until their evaluation is complete.  CT of the head and C-spine due to multiple falls, x-rays, labs for weakness   Versie Starks, PA-C 10/20/21 1310

## 2021-10-20 NOTE — Assessment & Plan Note (Addendum)
Patient was placed in a splint in the ED. Case discussed with Dr. Posey Pronto orthopedics and okay to follow-up as outpatient.

## 2021-10-20 NOTE — ED Provider Notes (Signed)
[facility] Provider Note    Event Date/Time   First MD Initiated Contact with Patient 10/20/21 1640     (approximate)   History   Fall and Weakness   HPI  Theresa Mills is a 71 y.o. female with past medical history of diabetes, Parkinson's, thyroid disease,'s sleep apnea who presents with generalized weakness and unsteadiness.  Patient tells me that for several months she has been feeling progressively weak.  On Saturday she had a fall where she fell onto her left hip.  She denies hitting her head.  She has chronic pain which she tells me is from her neck to her toes but since the fall the pain has been worse.  She still is able to ambulate but with difficulty really due more to her weakness rather than specific pain.  Complains of bilateral hip pain.  She endorses muscular pain in the chest denies shortness of breath.  Tells me that she saw her primary doctor today who adjusted her thyroid medications and her Parkinson's medication but it has not taken effect yet.    Past Medical History:  Diagnosis Date   Anxiety    Diabetes ([place])    Difficult intravenous access    GERD (gastroesophageal reflux disease)    HBP (high blood pressure)    Hyperlipidemia    Osteoarthritis    Parkinson disease ([place])    Sleep apnea    Thyroid disease     Patient Active Problem List   Diagnosis Date Noted   Compression fracture of lumbar vertebra ([place]) 10/10/2020   Major depression in remission ([place]) 09/23/2020   Hav (hallux abducto valgus), unspecified laterality 09/23/2020   Back pain 09/30/2019   Epidural abscess 09/30/2019   Psoas abscess, right ([place]) 09/30/2019   High anion gap metabolic acidosis 26/94/8546   Lumbar compression fracture ([place]) 08/25/2019   Osteomyelitis of lumbar vertebra ([place]) 08/25/2019   Discitis 08/25/2019   Malnutrition of moderate degree 08/20/2019   Severe sepsis ([place]) 08/19/2019   Bilateral hydronephrosis 08/19/2019   Acute  pyelonephritis 27/03/5007   Acute metabolic encephalopathy 38/18/2993   Acute urinary retention 08/19/2019   AKI (acute kidney injury) ([place]) 08/19/2019   Osteoarthritis of multiple joints    Frequent falls 08/05/2019   Urinary tract infection with hematuria 06/11/2019   Nausea and vomiting 02/13/2019   Acquired hypothyroidism 02/13/2019   Acquired bilateral hammer toes 01/23/2019   Type 2 diabetes mellitus with hyperglycemia ([place]) 09/15/2018   Encounter for general adult medical examination with abnormal findings 09/05/2017   Dysuria 09/05/2017   Uncontrolled type 2 diabetes mellitus with hyperglycemia ([place]) 06/18/2017   Rib fractures 06/11/2017   Pressure injury of coccygeal region, stage 2 ([place]) 06/11/2017   Rhabdomyolysis 06/10/2017   Elevated troponin 06/10/2017   Hypernatremia 06/10/2017   Fall 06/10/2017   Arthritis 01/08/2017   Cataracts, bilateral 01/08/2017   Chickenpox 01/08/2017   Shingles 01/08/2017   Depression 01/08/2017   Diabetes mellitus type 2, uncomplicated ([place]) 71/69/6789   Hyperlipidemia, unspecified 01/08/2017   Essential hypertension 01/08/2017   Migraine headache 01/08/2017   Thyroid disease 01/08/2017   Parkinson disease ([place]) 01/08/2017   Anxiety, generalized 09/01/2016   Dementia due to Parkinson's disease without behavioral disturbance ([place]) 09/01/2016   History of depression 09/01/2016   Morbid obesity with BMI of 40.0-44.9, adult ([place]) 09/01/2016   Restless leg syndrome 09/01/2016   Class 2 severe obesity with serious comorbidity in adult [facility]) 09/01/2016     Physical Exam  Triage Vital Signs: ED Triage Vitals  Enc Vitals Group     BP 10/20/21 1256 129/72     Pulse Rate 10/20/21 1256 71     Resp 10/20/21 1256 18     Temp 10/20/21 1256 98.3 F (36.8 C)     Temp Source 10/20/21 1256 Oral     SpO2 10/20/21 1256 93 %     Weight --      Height --      Head Circumference --      Peak Flow --      Pain Score 10/20/21 1307 9     Pain Loc  --      Pain Edu? --      Excl. in [place]? --     Most recent vital signs: Vitals:   10/20/21 1256 10/20/21 1507  BP: 129/72 132/71  Pulse: 71 68  Resp: 18 18  Temp: 98.3 F (36.8 C) 98.2 F (36.8 C)  SpO2: 93% 96%     General: Awake, no distress.  Chronically ill-appearing CV:  Good peripheral perfusion.  No significant pitting edema Resp:  Normal effort.  No increased work of breathing Abd:  No distention.  Abdomen soft nontender Neuro:             Awake, Alert, Oriented x 3  Other:  No focal CT or L-spine tenderness Hip is mildly tender but patient able to range Left wrist is mildly tender over the snuffbox, no obvious deformity She has pill-rolling tremor of the bilateral hands right greater than left   ED Results / Procedures / Treatments  Labs (all labs ordered are listed, but only abnormal results are displayed) Labs Reviewed  BASIC METABOLIC PANEL - Abnormal; Notable for the following components:      Result Value   BUN 27 (*)    All other components within normal limits  URINALYSIS, ROUTINE W REFLEX MICROSCOPIC - Abnormal; Notable for the following components:   Color, Urine YELLOW (*)    APPearance HAZY (*)    Ketones, ur 5 (*)    Leukocytes,Ua TRACE (*)    Bacteria, UA FEW (*)    All other components within normal limits  URINE CULTURE  CBC  TSH  T4, FREE  CBG MONITORING, ED     EKG  EKG interpretation performed by myself: NSR, nml axis, nml intervals, no acute ischemic changes    RADIOLOGY I reviewed and interpreted the CT scan of the brain which does not show any acute intracranial process    PROCEDURES:  Critical Care performed: No  Procedures  The patient is on the cardiac monitor to evaluate for evidence of arrhythmia and/or significant heart rate changes.   MEDICATIONS ORDERED IN ED: Medications  oxyCODONE-acetaminophen (PERCOCET/ROXICET) 5-325 MG per tablet 1 tablet (1 tablet Oral Given 10/20/21 1658)  ondansetron (ZOFRAN-ODT)  disintegrating tablet 4 mg (4 mg Oral Given 10/20/21 1659)  fosfomycin (MONUROL) packet 3 g (3 g Oral Given 10/20/21 1734)     IMPRESSION / MDM / ASSESSMENT AND PLAN / ED COURSE  I reviewed the triage vital signs and the nursing notes.                              Patient's presentation is most consistent with acute presentation with potential threat to life or bodily function.  Differential diagnosis includes, but is not limited to, hip fracture, exacerbation of chronic pain, wrist fracture, UTI, electrolyte abnormality,  renal failure, medication side effect  The patient is a 71 year old female with history of Parkinson's disease who presents because of increased weakness increased falls unsteadiness.  She has been feeling weak for several months.  Had a fall on Saturday and seems to have had more of a decline since then.  She has chronic pain that involves her whole body but it is now worse since the fall particularly the left hip.  Is able to ambulate but with difficulty.  She is by herself during the day and has an aide who comes at night.  She denies any cardiopulmonary complaints GI complaints or urinary symptoms.  Labs overall are benign.  UA with some white cells not super convincing for UTI, give her a dose of fosfomycin just to take this off the table.  Urine culture sent.  CT head and C-spine obtained are negative for fracture.  Left hip does not show fracture and she is able to range the my suspicion for occult fracture is low.  X-ray of the left wrist shows widening of the scapholunate junction she has tender over the snuffbox so will place in thumb spica splint and she will need to follow-up with orthopedics.  Labs otherwise are reassuring as is her EKG.  Patient was complaining of nausea but did not tolerate p.o.  She was given Percocet for pain.  Ultimately I do not think that she will be safe to go home just given how unsteady she is.  I am concerned that she may not be optimized from  medication standpoint given her Parkinson's.  Ultimately she just may need to be placed but I would like her to at least be evaluated by medicine team to ensure that there is nothing we can do medication wise      FINAL CLINICAL IMPRESSION(S) / ED DIAGNOSES   Final diagnoses:  Weakness     Rx / DC Orders   ED Discharge Orders     None        Note:  This document was prepared using Dragon voice recognition software and may include unintentional dictation errors.   Rada Hay, MD 10/20/21 2018

## 2021-10-20 NOTE — Assessment & Plan Note (Deleted)
History of nephrolithiasis and UTIs Received fosfomycin in the ED Klebsiella growing out of urine culture.

## 2021-10-20 NOTE — Assessment & Plan Note (Addendum)
Continue Sinemet and ropinirole.  Check orthostatics if able to do so.

## 2021-10-20 NOTE — ED Notes (Signed)
Bg 91

## 2021-10-20 NOTE — IPAL (Signed)
  Interdisciplinary Goals of Care Family Meeting   Date carried out: 10/20/2021  Location of the meeting: Bedside  Member's involved: Physician  Durable Power of Attorney or acting medical decision maker: patient    Discussion: We discussed goals of care for Theresa Mills .    Code status: Full DNR  Disposition: Continue current acute care  Time spent for the meeting: [place], MD  10/20/2021, 10:48 PM

## 2021-10-20 NOTE — Assessment & Plan Note (Addendum)
Continue metformin Hemoglobin A1c low at 5.3.

## 2021-10-20 NOTE — Assessment & Plan Note (Deleted)
Frequent falls and ambulatory dysfunction Suspect related to physical deconditioning, and poor gait from chronic back pain and Parkinson's disease Physical therapy evaluation and TOC consult Pain control, avoiding sedating meds

## 2021-10-20 NOTE — Progress Notes (Signed)
[facility] [place] [address], [place] [address]  Internal MEDICINE  Office Visit Note  Patient Name: Theresa Mills  [id number]  [id number]  Date of Service: 10/20/2021  Chief Complaint  Patient presents with   Acute Visit    Recent fall     HPI Theresa Mills presents for an acute sick visit for recent fall, generalized weakness, declining functional status. Had labs done recently at her endocrinologist office, lipid panel was normal and her metabolic panel was good -- Reports her ankles and knees feel twisted, after the fall she refused to go to the ER when recommended by her caregivers so no x-rays have been taken of her knees or ankles. Her caregiver that is present with her today states that she has been in rehab at [facility] in [place] before and that this place did very well for physical therapy and that that would be a good place for her to go The patient came into the clinic ambulating with her walker, at the end of the visit when it was time to leave she was unable to stand up and walk out and her caregivers were unable to pick her up even with the help of our staff. --See assessment and plan for further details   Current Medication:  No facility-administered encounter medications on file as of 10/20/2021.   Outpatient Encounter Medications as of 10/20/2021  Medication Sig Note   acetaminophen (TYLENOL 8 HOUR) 650 MG CR tablet Take 650 mg by mouth every 8 (eight) hours as needed for pain.    Calcium Carbonate-Vitamin D 600-200 MG-UNIT TABS Take 1 tablet by mouth daily.     carbidopa-levodopa (SINEMET IR) 25-100 MG tablet Take 2 tablets by mouth 4 (four) times daily.    celecoxib (CELEBREX) 200 MG capsule TAKE 1 CAPSULE BY MOUTH TWO TIMES DAILY AS NEEDED (Patient taking differently: Take 200 mg by mouth 2 (two) times daily.) 10/22/2021: LF 09-22-2021 60days   clotrimazole-betamethasone (LOTRISONE) cream APPLY 1 APPLICATION TOPICALLY 2 TIMES DAILY.     diclofenac Sodium (VOLTAREN) 1 % GEL Apply topically 4 (four) times daily. OTC    fexofenadine (ALLEGRA) 180 MG tablet Take 180 mg by mouth daily. OTC (over the counter)    glucose blood test strip TEST BLOOD SUGAR BID    Lidocaine 4 % PTCH Apply 1 patch topically daily.    melatonin 3 MG TABS tablet Take 1 tablet (3 mg total) by mouth at bedtime as needed (sleep). 10/22/2021: LF 4-24-203 180days   metFORMIN (GLUCOPHAGE) 500 MG tablet Take 1 tablet (500 mg total) by mouth 2 (two) times daily. 10/22/2021: LF 09-22-2021 90days   Multiple Vitamins-Minerals (MULTIVITAMIN WOMEN 50+ PO) Take 1 tablet by mouth daily.     omeprazole (PRILOSEC) 40 MG capsule TAKE 1 CAPSULE BY MOUTH EVERY DAY 10/22/2021: LF 08-10-2021 90days   ondansetron (ZOFRAN) 4 MG tablet Take 1 tablet (4 mg total) by mouth every 8 (eight) hours as needed for nausea or vomiting.    ONETOUCH DELICA LANCETS 35T MISC TEST BLOOD SUGAR BID    rOPINIRole (REQUIP) 2 MG tablet Take 1 tablet (2 mg total) by mouth at bedtime. 10/22/2021: LF 10-05-2021 90days   sertraline (ZOLOFT) 100 MG tablet Take 1 tablet (100 mg total) by mouth daily.    simvastatin (ZOCOR) 40 MG tablet TAKE ONE TABLET BY MOUTH AT BEDTIME FOR CHOLESTEROL 10/22/2021: LF 08-25-2021 90days   SYNTHROID 112 MCG tablet Take 1 tablet (112 mcg total) by mouth daily.  Medical History: Past Medical History:  Diagnosis Date   Anxiety    Diabetes ([place])    Difficult intravenous access    GERD (gastroesophageal reflux disease)    HBP (high blood pressure)    Hyperlipidemia    Osteoarthritis    Parkinson disease (H[facility])    Sleep apnea    Thyroid disease      Vital Signs: BP 127/88   Pulse 88   Temp 98.1 F (36.7 C)   Resp 16   Ht '5\' 3"'$  (1.6 m)   Wt 190 lb 12.8 oz (86.5 kg)   SpO2 95%   BMI 33.80 kg/m    Review of Systems  Constitutional:  Negative for activity change, appetite change, chills, fatigue, fever and unexpected weight change.  HENT: Negative.  Negative for  congestion, ear pain, rhinorrhea, sore throat and trouble swallowing.   Eyes: Negative.   Respiratory: Negative.  Negative for cough, chest tightness, shortness of breath and wheezing.   Cardiovascular: Negative.  Negative for chest pain.  Gastrointestinal: Negative.  Negative for abdominal pain, blood in stool, constipation, diarrhea, nausea and vomiting.  Endocrine: Negative.   Genitourinary: Negative.  Negative for difficulty urinating, dysuria, frequency, hematuria and urgency.  Musculoskeletal:  Positive for arthralgias, back pain, gait problem, joint swelling, neck pain and neck stiffness. Negative for myalgias.  Skin: Negative.  Negative for rash and wound.  Allergic/Immunologic: Negative.  Negative for immunocompromised state.  Neurological:  Positive for tremors and weakness. Negative for dizziness, seizures, numbness and headaches.  Hematological: Negative.  Negative for adenopathy. Does not bruise/bleed easily.  Psychiatric/Behavioral:  Positive for sleep disturbance. Negative for behavioral problems, self-injury and suicidal ideas. The patient is not nervous/anxious.     Physical Exam Vitals reviewed.  Constitutional:      General: She is not in acute distress.    Appearance: Normal appearance. She is obese. She is ill-appearing.  HENT:     Head: Normocephalic and atraumatic.  Eyes:     Pupils: Pupils are equal, round, and reactive to light.  Cardiovascular:     Rate and Rhythm: Normal rate and regular rhythm.     Heart sounds: Normal heart sounds. No murmur heard. Pulmonary:     Effort: Pulmonary effort is normal. No respiratory distress.     Breath sounds: Normal breath sounds. No wheezing.  Musculoskeletal:     Right lower leg: Edema present.     Left lower leg: Edema present.  Neurological:     Mental Status: She is alert. Mental status is at baseline.     Motor: Weakness present.     Coordination: Coordination abnormal.     Gait: Gait abnormal.  Psychiatric:         Mood and Affect: Mood normal.        Behavior: Behavior normal.       Assessment/Plan: 1. Generalized weakness Patient has been getting progressively weaker over time, does not move much at home, does not participate in physical therapy comes to her house and so she continues to get weaker and decline in functional status, see problem #2.  As she gets weaker she has more frequent falls, see problem #3.  As she continues to move less the symptoms of her Parkinson's disease continue to progress, see problem #4.  The less she moves, the stiffer her joints get which makes it even more difficult to start moving again, see problem #5 --Patient was unable to stand up or walk at the end of her visit,  although she did ambulate with a walker into the building at the beginning of her visit.  [facility] was called due to patient unable to walk after recent fall and has not been initially evaluated and no x-rays have been done after that fall.  EMS was also informed of her declining functional status, generalized weakness and Parkinson's disease. Home health was ordered as requested but will put this on hold for now - Ambulatory referral to [facility]  2. Declining functional status - Ambulatory referral to Home Health  3. Frequent falls - Ambulatory referral to [place]  4. Parkinson's disease with dyskinesia and fluctuating manifestations - Ambulatory referral to [place]  5. Primary osteoarthritis involving multiple joints - Ambulatory referral to Home Health   General Counseling: emmanuelle hibbitts understanding of the findings of todays visit and agrees with plan of treatment. I have discussed any further diagnostic evaluation that may be needed or ordered today. We also reviewed her medications today. she has been encouraged to call the office with any questions or concerns that should arise related to todays visit.    Counseling:    Orders Placed This Encounter   Procedures   Ambulatory referral to [place]    No orders of the defined types were placed in this encounter.   Return for EMS called and patient transported via ambulance to [facility].  [place] Controlled Substance Database was reviewed by me for overdose risk score (ORS)  Time spent:30 Minutes Time spent with patient included reviewing progress notes, labs, imaging studies, and discussing plan for follow up.   This patient was seen by Jonetta Osgood, FNP-C in collaboration with Dr. Clayborn Bigness as a part of collaborative care agreement.  Rikki Trosper R. Valetta Fuller, MSN, FNP-C Internal Medicine

## 2021-10-20 NOTE — Assessment & Plan Note (Addendum)
History of lumbar osteomyelitis July 2021 History of osteoporotic L1 fracture September 2022 Continue Tylenol and Celebrex Patient also takes Percocet at home If no improvement with ambulation tomorrow with physical therapy may consider MRI of the lumbar spine.

## 2021-10-20 NOTE — H&P (Signed)
History and Physical    Patient: Theresa Mills YHC:[id number] DOB: [birth year] DOA: 10/20/2021 DOS: the patient was seen and examined on 10/20/2021 PCP: Jonetta Osgood, NP  Patient coming from: Home  Chief Complaint:  Chief Complaint  Patient presents with   Fall   Weakness    HPI: [name] is a 71 y.o. female with medical history significant for Parkinson's disease, anxiety and depression, hypothyroidism, diabetes, HTN, chronic back pain, frequent falls, nephrolithiasis with prior UTIs, who presents to the ED with a complaint of generalized weakness and worsening of her chronic pain following a fall onto her left side a week prior.  This states that since the fall her whole body seems to be aching and while she can ambulate, she has difficulty doing so and has worsening pain of the left hip and the left wrist.  Patient lives alone but has an aide at nights and she is having increasing difficulty managing during the daytime on her own since the fall.  She otherwise denies complaints except for occasional nausea.  She denies  vomiting, decreased appetite, abdominal pain or diarrhea or dysuria.  She has no cough, shortness of breath, fever or chills.  Denies chest pain.  Denies headache, visual disturbance or one-sided weakness or numbness. ED course and data review: Vitals within normal limits.  Labs unremarkable.  Urinalysis showed few bacteria and trace leukocyte esterase.  EKG, personally viewed and interpreted showed sinus at 72 with nonspecific ST-T wave changes. Imaging: Patient had extensive trauma imaging to include CT C-spine head, left tib-fib left ankle left forearm chest left hip and left wrist.  Pertinent findings was a left wrist sprain as follows: IMPRESSION: 1. Widening of the scapholunate distance suggesting scapholunate ligament tear. 2. No acute fracture. 3. Soft tissue swelling.    Patient was treated with Percocet and given a dose of fosfomycin.  Hospitalist  consulted for admission due to concern for unsafe discharge options   Review of Systems: As mentioned in the history of present illness. All other systems reviewed and are negative.  Past Medical History:  Diagnosis Date   Anxiety    Diabetes ([place])    Difficult intravenous access    GERD (gastroesophageal reflux disease)    HBP (high blood pressure)    Hyperlipidemia    Osteoarthritis    Parkinson disease ([place])    Sleep apnea    Thyroid disease    Past Surgical History:  Procedure Laterality Date   ABDOMINAL HYSTERECTOMY     CATARACT EXTRACTION Bilateral    CHOLECYSTECTOMY     COLONOSCOPY WITH PROPOFOL N/A 04/02/2015   Procedure: COLONOSCOPY WITH PROPOFOL;  Surgeon: Josefine Class, MD;  Location: [facility] ENDOSCOPY;  Service: Endoscopy;  Laterality: N/A;   ESOPHAGOGASTRODUODENOSCOPY (EGD) WITH PROPOFOL N/A 04/02/2015   Procedure: ESOPHAGOGASTRODUODENOSCOPY (EGD) WITH PROPOFOL;  Surgeon: Josefine Class, MD;  Location: [facility] ENDOSCOPY;  Service: Endoscopy;  Laterality: N/A;   GALLBLADDER SURGERY     LAPAROSCOPIC HYSTERECTOMY     THYROIDECTOMY Left    TONSILLECTOMY     TONSILLECTOMY Bilateral    Social History:  reports that she has never smoked. She has never used smokeless tobacco. She reports that she does not drink alcohol and does not use drugs.  Allergies  Allergen Reactions   Codeine Other (See Comments)    HALLUCINATIONS   Ephadrene [Cholestatin] Other (See Comments)    HYPER AND NERVOUS   Other Other (See Comments)    HYPER  HYPER AND NERVOUS  Valium [Diazepam] Other (See Comments)    OVERLY SENSITIVE/TOO STRONG   Ephedrine     Family History  Problem Relation Age of Onset   Asthma Father    Cancer Sister    Stroke Maternal Uncle    Heart disease Maternal Uncle    Diabetes Maternal Uncle     Prior to Admission medications   Medication Sig Start Date End Date Taking? Authorizing Provider  acetaminophen (TYLENOL 8 HOUR) 650 MG CR tablet Take 650 mg  by mouth every 8 (eight) hours as needed for pain.    [provider]  Calcium Carbonate-Vitamin D 600-200 MG-UNIT TABS Take 1 tablet by mouth daily.     [provider]  carbidopa-levodopa (SINEMET IR) 25-100 MG tablet Take 2 tablets by mouth 4 (four) times daily. 09/12/21   Jonetta Osgood, NP  celecoxib (CELEBREX) 200 MG capsule TAKE 1 CAPSULE BY MOUTH TWO TIMES DAILY AS NEEDED 09/22/21   Jonetta Osgood, NP  clotrimazole-betamethasone (LOTRISONE) cream APPLY 1 APPLICATION TOPICALLY 2 TIMES DAILY. 06/24/21   Jonetta Osgood, NP  diclofenac Sodium (VOLTAREN) 1 % GEL Apply topically 4 (four) times daily. OTC    [provider]  fexofenadine (ALLEGRA) 180 MG tablet Take 180 mg by mouth daily. OTC (over the counter)    [provider]  glucose blood test strip TEST BLOOD SUGAR BID 01/27/16   [provider]  Lidocaine 4 % PTCH Apply 1 patch topically daily. 10/05/20   Triplett, Cari B, FNP  melatonin 3 MG TABS tablet Take 1 tablet (3 mg total) by mouth at bedtime as needed (sleep). 09/12/21   Jonetta Osgood, NP  metFORMIN (GLUCOPHAGE) 500 MG tablet Take 1 tablet (500 mg total) by mouth 2 (two) times daily. 09/12/21   Jonetta Osgood, NP  Multiple Vitamins-Minerals (MULTIVITAMIN WOMEN 50+ PO) Take 1 tablet by mouth daily.     [provider]  omeprazole (PRILOSEC) 40 MG capsule TAKE 1 CAPSULE BY MOUTH EVERY DAY 09/12/21   Jonetta Osgood, NP  ondansetron (ZOFRAN) 4 MG tablet Take 1 tablet (4 mg total) by mouth every 8 (eight) hours as needed for nausea or vomiting. 09/12/21   Jonetta Osgood, NP  ONETOUCH DELICA LANCETS 60F MISC TEST BLOOD SUGAR BID 01/27/16   [provider]  rOPINIRole (REQUIP) 2 MG tablet Take 1 tablet (2 mg total) by mouth at bedtime. 09/12/21   Jonetta Osgood, NP  sertraline (ZOLOFT) 100 MG tablet Take 1 tablet (100 mg total) by mouth daily. 09/12/21   Jonetta Osgood, NP  simvastatin (ZOCOR) 40 MG tablet TAKE  ONE TABLET BY MOUTH AT BEDTIME FOR CHOLESTEROL 09/12/21   Abernathy, Yetta Flock, NP  SYNTHROID 112 MCG tablet Take 1 tablet (112 mcg total) by mouth daily. 09/12/21   Jonetta Osgood, NP    Physical Exam: Vitals:   10/20/21 1256 10/20/21 1507  BP: 129/72 132/71  Pulse: 71 68  Resp: 18 18  Temp: 98.3 F (36.8 C) 98.2 F (36.8 C)  TempSrc: Oral Oral  SpO2: 93% 96%   Physical Exam Vitals and nursing note reviewed.  Constitutional:      General: She is not in acute distress. HENT:     Head: Normocephalic and atraumatic.  Cardiovascular:     Rate and Rhythm: Normal rate and regular rhythm.     Heart sounds: Normal heart sounds.  Pulmonary:     Effort: Pulmonary effort is normal.     Breath sounds: Normal breath sounds.  Abdominal:     Palpations:  Abdomen is soft.     Tenderness: There is no abdominal tenderness.  Neurological:     Mental Status: Mental status is at baseline.     Labs on Admission: I have personally reviewed following labs and imaging studies  CBC: Recent Labs  Lab 10/20/21 1315  WBC 9.0  HGB 13.0  HCT 41.6  MCV 98.8  PLT 235   Basic Metabolic Panel: Recent Labs  Lab 10/20/21 1315  NA 144  K 3.9  CL 106  CO2 27  GLUCOSE 84  BUN 27*  CREATININE 0.61  CALCIUM 9.5   GFR: Estimated Creatinine Clearance: 66.2 mL/min (by C-G formula based on SCr of 0.61 mg/dL). Liver Function Tests: No results for input(s): "AST", "ALT", "ALKPHOS", "BILITOT", "PROT", "ALBUMIN" in the last 168 hours. No results for input(s): "LIPASE", "AMYLASE" in the last 168 hours. No results for input(s): "AMMONIA" in the last 168 hours. Coagulation Profile: No results for input(s): "INR", "PROTIME" in the last 168 hours. Cardiac Enzymes: No results for input(s): "CKTOTAL", "CKMB", "CKMBINDEX", "TROPONINI" in the last 168 hours. BNP (last 3 results) No results for input(s): "PROBNP" in the last 8760 hours. HbA1C: No results for input(s): "HGBA1C" in the last 72  hours. CBG: Recent Labs  Lab 10/20/21 1313  GLUCAP 91   Lipid Profile: No results for input(s): "CHOL", "HDL", "LDLCALC", "TRIG", "CHOLHDL", "LDLDIRECT" in the last 72 hours. Thyroid Function Tests: No results for input(s): "TSH", "T4TOTAL", "FREET4", "T3FREE", "THYROIDAB" in the last 72 hours. Anemia Panel: No results for input(s): "VITAMINB12", "FOLATE", "FERRITIN", "TIBC", "IRON", "RETICCTPCT" in the last 72 hours. Urine analysis:    Component Value Date/Time   COLORURINE YELLOW (A) 10/20/2021 1315   APPEARANCEUR HAZY (A) 10/20/2021 1315   APPEARANCEUR Cloudy (A) 09/12/2021 1613   LABSPEC 1.025 10/20/2021 1315   LABSPEC 1.021 01/13/2014 0737   PHURINE 6.0 10/20/2021 1315   GLUCOSEU NEGATIVE 10/20/2021 1315   GLUCOSEU >=500 01/13/2014 0737   HGBUR NEGATIVE 10/20/2021 1315   BILIRUBINUR NEGATIVE 10/20/2021 1315   BILIRUBINUR Negative 09/12/2021 1613   BILIRUBINUR Negative 01/13/2014 0737   KETONESUR 5 (A) 10/20/2021 1315   PROTEINUR NEGATIVE 10/20/2021 1315   UROBILINOGEN 0.2 05/12/2020 0838   NITRITE NEGATIVE 10/20/2021 1315   LEUKOCYTESUR TRACE (A) 10/20/2021 1315   LEUKOCYTESUR 3+ 01/13/2014 0737    Radiological Exams on Admission: DG Wrist Complete Left  Result Date: 10/20/2021 CLINICAL DATA:  Left wrist pain EXAM: LEFT WRIST - COMPLETE 3+ VIEW COMPARISON:  None Available. FINDINGS: There is widening of the scapholunate distance measuring 5 mm. There is no evidence for acute fracture. There is soft tissue swelling surrounding the wrist. Alignment is otherwise anatomic. IMPRESSION: 1. Widening of the scapholunate distance suggesting scapholunate ligament tear. 2. No acute fracture. 3. Soft tissue swelling. Electronically Signed   By: Ronney Asters M.D.   On: 10/20/2021 19:37   DG Hip Unilat W or Wo Pelvis 2-3 Views Left  Result Date: 10/20/2021 CLINICAL DATA:  Left hip pain after fall. EXAM: DG HIP (WITH OR WITHOUT PELVIS) 2-3V LEFT COMPARISON:  None Available.  FINDINGS: There is no evidence of hip fracture or dislocation. Severe degenerative changes seen involving the right hip. Moderate degenerative changes seen involving the left hip IMPRESSION: Degenerative joint disease is seen involving both hips. No acute abnormality seen. Electronically Signed   By: Marijo Conception M.D.   On: 10/20/2021 18:14   DG Chest Portable 1 View  Result Date: 10/20/2021 CLINICAL DATA:  Pain after fall. EXAM: PORTABLE  CHEST 1 VIEW COMPARISON:  October 10, 2020. FINDINGS: The heart size and mediastinal contours are within normal limits. Both lungs are clear. Levoscoliosis of lower thoracic spine is noted. IMPRESSION: No active disease. Electronically Signed   By: Marijo Conception M.D.   On: 10/20/2021 18:12   DG Forearm Left  Result Date: 10/20/2021 CLINICAL DATA:  Fall EXAM: LEFT FOREARM - 2 VIEW COMPARISON:  None Available. FINDINGS: There is no evidence of acute fracture in the left forearm. Alignment is normal. Mild degenerative changes at the wrist and elbow. There is widening of the scapholunate interval, slight proximal migration of the capitate and rotation of the scaphoid. IMPRESSION: No evidence of acute fracture in the left forearm. Widening of the scapholunate interval at the wrist suggesting scapholunate ligament insufficiency/tear. Electronically Signed   By: Maurine Simmering M.D.   On: 10/20/2021 14:12   DG Ankle Complete Left  Result Date: 10/20/2021 CLINICAL DATA:  Fall EXAM: LEFT ANKLE COMPLETE - 3+ VIEW COMPARISON:  None Available. FINDINGS: There is left ankle soft tissue swelling. There is no definite acute fracture. There is mild tibiotalar osteoarthritis. There is a bulky os navicularis. There is mild talonavicular osteoarthritis. Plantar and dorsal calcaneal spurring. IMPRESSION: Ankle soft tissue swelling.  No definite acute fracture. Electronically Signed   By: Maurine Simmering M.D.   On: 10/20/2021 14:09   DG Tibia/Fibula Left  Result Date: 10/20/2021 CLINICAL  DATA:  Fall EXAM: LEFT TIBIA AND FIBULA - 2 VIEW COMPARISON:  None Available. FINDINGS: There is no evidence of acute fracture. Alignment is normal. Osteopenia. There is tricompartment osteoarthritis of the knee. Trace left knee joint effusion. Bulky left knee osteophytes. IMPRESSION: No evidence of acute fracture involving the left tibia or fibula. Electronically Signed   By: Maurine Simmering M.D.   On: 10/20/2021 14:08   CT HEAD WO CONTRAST (5MM)  Result Date: 10/20/2021 CLINICAL DATA:  Head trauma, minor (Age >= 65y); Neck trauma (Age >= 65y) EXAM: CT HEAD WITHOUT CONTRAST CT CERVICAL SPINE WITHOUT CONTRAST TECHNIQUE: Multidetector CT imaging of the head and cervical spine was performed following the standard protocol without intravenous contrast. Multiplanar CT image reconstructions of the cervical spine were also generated. RADIATION DOSE REDUCTION: This exam was performed according to the departmental dose-optimization program which includes automated exposure control, adjustment of the mA and/or kV according to patient size and/or use of iterative reconstruction technique. COMPARISON:  Head CT 10/10/2020, CT cervical spine 08/05/2019. FINDINGS: CT HEAD FINDINGS Brain: No evidence of acute intracranial hemorrhage or extra-axial collection.No evidence of mass lesion/concerning mass effect.The ventricles are normal in size. Vascular: Vascular calcifications. Skull: Hyperostosis frontalis internus. Negative for skull fracture. Sinuses/Orbits: No acute finding. Other: None. CT CERVICAL SPINE FINDINGS Alignment: Normal. Skull base and vertebrae: No acute cervical spine fracture. No aggressive osseous lesion. Soft tissues and spinal canal: No prevertebral fluid or swelling. No visible canal hematoma. Disc levels: There is mild multilevel degenerative disc disease with moderate severe multilevel facet arthropathy. Upper chest: Negative. Other: None. IMPRESSION: No acute intracranial abnormality. No acute cervical  spine fracture. Electronically Signed   By: Maurine Simmering M.D.   On: 10/20/2021 13:53   CT Cervical Spine Wo Contrast  Result Date: 10/20/2021 CLINICAL DATA:  Head trauma, minor (Age >= 65y); Neck trauma (Age >= 65y) EXAM: CT HEAD WITHOUT CONTRAST CT CERVICAL SPINE WITHOUT CONTRAST TECHNIQUE: Multidetector CT imaging of the head and cervical spine was performed following the standard protocol without intravenous contrast. Multiplanar CT image reconstructions of  the cervical spine were also generated. RADIATION DOSE REDUCTION: This exam was performed according to the departmental dose-optimization program which includes automated exposure control, adjustment of the mA and/or kV according to patient size and/or use of iterative reconstruction technique. COMPARISON:  Head CT 10/10/2020, CT cervical spine 08/05/2019. FINDINGS: CT HEAD FINDINGS Brain: No evidence of acute intracranial hemorrhage or extra-axial collection.No evidence of mass lesion/concerning mass effect.The ventricles are normal in size. Vascular: Vascular calcifications. Skull: Hyperostosis frontalis internus. Negative for skull fracture. Sinuses/Orbits: No acute finding. Other: None. CT CERVICAL SPINE FINDINGS Alignment: Normal. Skull base and vertebrae: No acute cervical spine fracture. No aggressive osseous lesion. Soft tissues and spinal canal: No prevertebral fluid or swelling. No visible canal hematoma. Disc levels: There is mild multilevel degenerative disc disease with moderate severe multilevel facet arthropathy. Upper chest: Negative. Other: None. IMPRESSION: No acute intracranial abnormality. No acute cervical spine fracture. Electronically Signed   By: Maurine Simmering M.D.   On: 10/20/2021 13:53     Data Reviewed: Relevant notes from primary care and specialist visits, past discharge summaries as available in EHR, including Care Everywhere. Prior diagnostic testing as pertinent to current admission diagnoses Updated medications and  problem lists for reconciliation ED course, including vitals, labs, imaging, treatment and response to treatment Triage notes, nursing and pharmacy notes and ED provider's notes Notable results as noted in HPI   Assessment and Plan: * Fall at home, initial encounter Frequent falls and ambulatory dysfunction Suspect related to physical deconditioning, and poor gait from chronic back pain and Parkinson's disease Physical therapy evaluation and TOC consult Pain control, avoiding sedating meds  Bacteriuria History of nephrolithiasis and UTIs Received fosfomycin in the ED Follow urine culture  Sprain of scapholunate ligament, initial encounter Patient was placed in a splint in the ED Follow-up outpatient with orthopedics Pain control  Chronic back pain History of lumbar osteomyelitis July 2021 History of osteoporotic L1 fracture September 2022 Continue Tylenol and Celebrex   Acquired hypothyroidism Continue levothyroxine  Parkinson disease ([place]) Continue Sinemet and ropinirole  Diabetes mellitus type 2, uncomplicated (HCC) Continue metformin Sliding scale insulin  Anxiety and depression Continue Zoloft   DVT prophylaxis: Lovenox  Consults: none  Advance Care Planning:   Code Status: Prior   Family Communication: none  Disposition Plan: Back to previous home environment  Severity of Illness: The appropriate patient status for this patient is OBSERVATION. Observation status is judged to be reasonable and necessary in order to provide the required intensity of service to ensure the patient's safety. The patient's presenting symptoms, physical exam findings, and initial radiographic and laboratory data in the context of their medical condition is felt to place them at decreased risk for further clinical deterioration. Furthermore, it is anticipated that the patient will be medically stable for discharge from the hospital within 2 midnights of admission.   Author: Athena Masse, MD 10/20/2021 8:49 PM  For on call review [email or web address].

## 2021-10-21 DIAGNOSIS — G20 Parkinson's disease: Secondary | ICD-10-CM

## 2021-10-21 DIAGNOSIS — E119 Type 2 diabetes mellitus without complications: Secondary | ICD-10-CM | POA: Diagnosis not present

## 2021-10-21 DIAGNOSIS — M549 Dorsalgia, unspecified: Secondary | ICD-10-CM

## 2021-10-21 DIAGNOSIS — R531 Weakness: Secondary | ICD-10-CM | POA: Diagnosis not present

## 2021-10-21 DIAGNOSIS — G8929 Other chronic pain: Secondary | ICD-10-CM

## 2021-10-21 DIAGNOSIS — S63519S Sprain of carpal joint of unspecified wrist, sequela: Secondary | ICD-10-CM

## 2021-10-21 DIAGNOSIS — R8271 Bacteriuria: Secondary | ICD-10-CM

## 2021-10-21 LAB — GLUCOSE, CAPILLARY
Glucose-Capillary: 84 mg/dL (ref 70–99)
Glucose-Capillary: 145 mg/dL — ABNORMAL HIGH (ref 70–99)
Glucose-Capillary: 99 mg/dL (ref 70–99)
Glucose-Capillary: 164 mg/dL — ABNORMAL HIGH (ref 70–99)

## 2021-10-21 LAB — HEMOGLOBIN A1C
Hgb A1c MFr Bld: 5.3 % (ref 4.8–5.6)
Mean Plasma Glucose: 105.41 mg/dL

## 2021-10-21 MED ORDER — OXYCODONE-ACETAMINOPHEN 5-325 MG PO TABS
1.0000 | ORAL_TABLET | Freq: Four times a day (QID) | ORAL | Status: DC | PRN
  Administered 2021-10-21 – 2021-10-27 (×16): 1 via ORAL
  Filled 2021-10-21 (×17): qty 1

## 2021-10-21 MED ORDER — ACETAMINOPHEN 650 MG RE SUPP: 650.0000 mg | Freq: Four times a day (QID) | RECTAL | Status: DC | PRN

## 2021-10-21 MED ORDER — ACETAMINOPHEN 500 MG PO TABS
1000.0000 mg | ORAL_TABLET | Freq: Four times a day (QID) | ORAL | Status: DC | PRN
  Administered 2021-10-21: 1000 mg via ORAL
  Filled 2021-10-21: qty 2

## 2021-10-21 MED ORDER — SIMETHICONE 80 MG PO CHEW
80.0000 mg | CHEWABLE_TABLET | Freq: Four times a day (QID) | ORAL | Status: DC | PRN
  Filled 2021-10-21: qty 1

## 2021-10-21 NOTE — Progress Notes (Signed)
  Progress Note   Patient: Theresa Mills IHW:[id number] DOB: [birth year] DOA: 10/20/2021     0 DOS: the patient was seen and examined on 10/21/2021     Assessment and Plan: * Generalized weakness Frequent falls.  Patient states her ankles and knees gave out.  Physical therapy recommends rehab  Sprain of scapholunate ligament, sequela Patient was placed in a splint in the ED. Case discussed with Dr. Posey Pronto orthopedics and okay to follow-up as outpatient.  Parkinson disease ([place]) Continue Sinemet and ropinirole  Hypothyroidism, unspecified Continue levothyroxine  Diabetes mellitus type 2, uncomplicated (HCC) Continue metformin Sliding scale insulin  Bacteriuria History of nephrolithiasis and UTIs Received fosfomycin in the ED Follow urine culture  Chronic back pain History of lumbar osteomyelitis July 2021 History of osteoporotic L1 fracture September 2022 Continue Tylenol and Celebrex Patient also takes Percocet at home  Anxiety and depression Continue Zoloft       Subjective: Patient stated she had a fall going from the toilet to the sink.  She states her legs gave out and twisted everything.  She was a little nauseous this morning.  She feels weak.  She lives alone and walks with a walker.  In the ER, her left arm was put in a splint.  Physical Exam: Vitals:   10/21/21 0548 10/21/21 0726 10/21/21 1040 10/21/21 1137  BP: 128/66 134/78  112/73  Pulse: (!) 57 (!) 57  67  Resp: '18 18  18  '$ Temp: 97.9 F (36.6 C) 98 F (36.7 C)  98.7 F (37.1 C)  TempSrc:      SpO2: 94% 95% 96% 92%  Weight:      Height:       Physical Exam HENT:     Head: Normocephalic.     Mouth/Throat:     Pharynx: No oropharyngeal exudate.  Eyes:     General: Lids are normal.     Conjunctiva/sclera: Conjunctivae normal.  Cardiovascular:     Rate and Rhythm: Normal rate and regular rhythm.     Heart sounds: Normal heart sounds, S1 normal and S2 normal.  Pulmonary:     Breath sounds:  Normal breath sounds. No decreased breath sounds, wheezing, rhonchi or rales.  Abdominal:     Palpations: Abdomen is soft.     Tenderness: There is no abdominal tenderness.  Musculoskeletal:     Right lower leg: Swelling present.     Left lower leg: Swelling present.     Comments: Right ankle turned out  Skin:    General: Skin is warm.     Findings: No rash.  Neurological:     Mental Status: She is alert and oriented to person, place, and time.     Comments: Patient able to wiggle her fingers left hand.  Patient able to straight leg raise.  No resting tremor seen when I saw her.     Data Reviewed: Creatinine 0.61, CBC normal limits  Family Communication:   Disposition: Status is: Observation Physical therapy recommending rehab  Planned Discharge Destination: Rehab    Time spent: 27 minutes  Author: Loletha Grayer, MD 10/21/2021 3:30 PM  For on call review [email or web address].

## 2021-10-21 NOTE — Evaluation (Signed)
Physical Therapy Evaluation Patient Details Name: Theresa Mills MRN: [id number] DOB: 1950-04-03 Today's Date: 10/21/2021  History of Present Illness  Pt is a 71 y.o. female presenting to hospital 9/28 with generalized weakness and unsteadiness; weakness for several months; recent fall onto L hip; c/o B hip pain and worsening chronic pain (neck to her toes).  Imaging showing widening of the scapholunate interval at the L wrist suggesting scapholunate ligament insufficiency/tear.  Pt admitted with fall at home, bacteriuria, sprain of scapholunate ligament, chronic back pain, and hypothyroidism.  PMH includes Parkinson's, thyroid disease, sleep apnea, chronic pain, anxiety, DM, high BP, lumbar compression fx, and RLS.  Clinical Impression  PT/OT co-evaluation performed.  Prior to hospital admission, pt was ambulatory with RW within home (uses manual w/c in community); lives in 1 level home (level entry); has friends that come to assist her daily.  Pt reporting 7-8/10 chronic low back and neck pain during session (pt received pain medication prior to session).  Currently pt is SBA semi-supine to sitting edge of bed but mod assist (using bed pad) to scoot forward towards edge of bed; unable to stand with 2 assist 1st trial; able to stand up to L platform RW with mod assist x2 2nd trial (pt stood for 3 minutes to finish orthostatic vitals but requiring CGA to min assist x2 for balance d/t posterior lean); pt fatigued with standing requiring sitting rest break; unable to stand 3rd and 4th trial with max assist x2; sit to supine in bed with mod assist x2.  See flow-sheets for orthostatic vitals (no significant systolic BP change noted).  Pt would benefit from skilled PT to address noted impairments and functional limitations (see below for any additional details).  Upon hospital discharge, pt would benefit from SNF.    Recommendations for follow up therapy are one component of a multi-disciplinary discharge  planning process, led by the attending physician.  Recommendations may be updated based on patient status, additional functional criteria and insurance authorization.  Follow Up Recommendations Skilled nursing-short term rehab (<3 hours/day) Can patient physically be transported by private vehicle: No    Assistance Recommended at Discharge Frequent or constant Supervision/Assistance  Patient can return home with the following  Two people to help with walking and/or transfers;Two people to help with bathing/dressing/bathroom;Assistance with cooking/housework;Assist for transportation    Equipment Recommendations BSC/3in1;Wheelchair (measurements PT);Wheelchair cushion (measurements PT);Hospital bed;Other (comment) (L platform RW)  Recommendations for Other Services       Functional Status Assessment Patient has had a recent decline in their functional status and demonstrates the ability to make significant improvements in function in a reasonable and predictable amount of time.     Precautions / Restrictions Precautions Precautions: Fall Required Braces or Orthoses: Other Brace Other Brace: L thumb spica splint      Mobility  Bed Mobility Overal bed mobility: Needs Assistance Bed Mobility: Supine to Sit, Sit to Supine     Supine to sit: Mod assist Sit to supine: Mod assist, +2 for physical assistance   General bed mobility comments: pt able to perform semi-supine to sitting with increased time/effort and use of bed rail but required mod assist (using bed pad) to scoot forward towards edge of bed; assist for trunk and B LE's sit to semi-supine in bed; vc's for technique    Transfers Overall transfer level: Needs assistance Equipment used: Left platform walker Transfers: Sit to/from Stand Sit to Stand: Mod assist, +2 physical assistance  General transfer comment: unable to stand 1st trial with 2 assist; able to stand up to L platform RW with mod assist x2 2nd  trial (pt stood for 3 minutes for orthostatics); and pt unable to stand up on 3rd and 4th trial with max assist x2; vc's for UE/LE placement, scooting to edge of bed, and overall technique; assist to initiate stand and control descent sitting    Ambulation/Gait               General Gait Details: unable at this time  Stairs            Wheelchair Mobility    Modified Rankin (Stroke Patients Only)       Balance Overall balance assessment: Needs assistance Sitting-balance support: Single extremity supported, Feet supported Sitting balance-Leahy Scale: Fair Sitting balance - Comments: steady static sitting   Standing balance support: Bilateral upper extremity supported, Reliant on assistive device for balance Standing balance-Leahy Scale: Poor Standing balance comment: CGA to min assist x2 for standing balance with L platform RW use; posterior lean noted                             Pertinent Vitals/Pain Pain Assessment Pain Assessment: 0-10 Pain Score: 8  (pt reports baseline pain around 7-8/10 in neck and low back) Pain Location: posterior neck and low back Pain Descriptors / Indicators: Aching, Constant, Guarding Pain Intervention(s): Limited activity within patient's tolerance, Monitored during session, Premedicated before session, Repositioned Vitals (HR and O2 on room air) stable and WFL throughout treatment session.    Home Living Family/patient expects to be discharged to:: Skilled nursing facility Living Arrangements: Alone Available Help at Discharge: Friend(s);Available PRN/intermittently Type of Home: House Home Access: Level entry       Home Layout: One level Home Equipment: Conservation officer, nature (2 wheels);Wheelchair - manual;Grab bars - tub/shower Additional Comments: Uses [employer] for medical transportation    Prior Function Prior Level of Function : Needs assist             Mobility Comments: Ambulates with RW in home and uses manual w/c  for community mobility.  H/o falls (but not very often). ADLs Comments: Friends come to assist (around 10am-5pm)     Hand Dominance        Extremity/Trunk Assessment   Upper Extremity Assessment Upper Extremity Assessment: Defer to OT evaluation    Lower Extremity Assessment Lower Extremity Assessment: RLE deficits/detail;LLE deficits/detail;Generalized weakness RLE Deficits / Details: R LE externally rotated (chronic positioning); at least 3/5 AROM hip flexion, knee flexion/extension, and DF/PF LLE Deficits / Details: at least 3/5 AROM hip flexion, knee flexion/extension and DF/PF    Cervical / Trunk Assessment Cervical / Trunk Assessment: Other exceptions Cervical / Trunk Exceptions: forward head/shoulders  Communication   Communication: No difficulties  Cognition Arousal/Alertness: Awake/alert Behavior During Therapy: Anxious Overall Cognitive Status: Within Functional Limits for tasks assessed                                          General Comments General comments (skin integrity, edema, etc.): L thumb spica splint in place.  Nursing cleared pt for participation in physical therapy.  Pt agreeable to PT session.    Exercises  Transfer training   Assessment/Plan    PT Assessment Patient needs continued PT services  PT Problem List Decreased  strength;Decreased activity tolerance;Decreased balance;Decreased mobility;Decreased knowledge of use of DME;Decreased knowledge of precautions;Pain       PT Treatment Interventions DME instruction;Gait training;Functional mobility training;Therapeutic activities;Therapeutic exercise;Balance training;Patient/family education    PT Goals (Current goals can be found in the Care Plan section)  Acute Rehab PT Goals Patient Stated Goal: to improve mobility and strength PT Goal Formulation: With patient Time For Goal Achievement: 11/04/21 Potential to Achieve Goals: Fair    Frequency Min 2X/week      Co-evaluation PT/OT/SLP Co-Evaluation/Treatment: Yes Reason for Co-Treatment: For patient/therapist safety;To address functional/ADL transfers PT goals addressed during session: Mobility/safety with mobility;Balance;Proper use of DME OT goals addressed during session: ADL's and self-care       AM-PAC PT "6 Clicks" Mobility  Outcome Measure Help needed turning from your back to your side while in a flat bed without using bedrails?: A Little Help needed moving from lying on your back to sitting on the side of a flat bed without using bedrails?: A Lot Help needed moving to and from a bed to a chair (including a wheelchair)?: Total Help needed standing up from a chair using your arms (e.g., wheelchair or bedside chair)?: Total Help needed to walk in hospital room?: Total Help needed climbing 3-5 steps with a railing? : Total 6 Click Score: 9    End of Session Equipment Utilized During Treatment: Gait belt Activity Tolerance: Patient limited by fatigue Patient left: in bed;with bed alarm set (with OT present finishing session) Nurse Communication: Mobility status;Precautions PT Visit Diagnosis: Unsteadiness on feet (R26.81);Other abnormalities of gait and mobility (R26.89);Muscle weakness (generalized) (M62.81);History of falling (Z91.81);Pain Pain - Right/Left: Left Pain - part of body: Hand    Time: 8299-3716 PT Time Calculation (min) (ACUTE ONLY): 33 min   Charges:   PT Evaluation $PT Eval Low Complexity: 1 Low PT Treatments $Therapeutic Activity: 8-22 mins       Leitha Bleak, PT 10/21/21, 1:24 PM

## 2021-10-21 NOTE — Evaluation (Signed)
Occupational Therapy Evaluation Patient Details Name: Theresa Mills MRN: [id number] DOB: 11-Jun-1950 Today's Date: 10/21/2021   History of Present Illness Pt is a 71 y.o. female presenting to hospital 9/28 with generalized weakness and unsteadiness; weakness for several months; recent fall onto L hip; c/o B hip pain and worsening chronic pain (neck to her toes).  Imaging showing widening of the scapholunate interval at the L wrist suggesting scapholunate ligament insufficiency/tear.  Pt admitted with fall at home, bacteriuria, sprain of scapholunate ligament, chronic back pain, and hypothyroidism.  PMH includes Parkinson's, thyroid disease, sleep apnea, chronic pain, anxiety, DM, high BP, lumbar compression fx, and RLS.   Clinical Impression   Pt was seen for OT evaluation and co-tx with PT this date. Prior to hospital admission, pt was living alone and had friends who came by daily and overnight for assist with ADL, IADL. She ambulates with a RW at home, w/c in community. Pt presents to acute OT demonstrating impaired ADL performance and functional mobility 2/2 decreased strength, activity tolerance, L wrist pain/immobilized, and balance (See OT problem list). Pt currently requires MOD A +2 to MAX A +2 for ADL transfer attempts with L platform walker. She requires MAX A for LB ADL, MIN-MOD A for seated UB ADL. Orthostatics taken (negative) (see vitals section for detail). Pt would benefit from skilled OT services to address noted impairments and functional limitations (see below for any additional details) in order to maximize safety and independence while minimizing falls risk and caregiver burden. Upon hospital discharge, recommend STR to maximize pt safety and return to PLOF.     Recommendations for follow up therapy are one component of a multi-disciplinary discharge planning process, led by the attending physician.  Recommendations may be updated based on patient status, additional functional  criteria and insurance authorization.   Follow Up Recommendations  Skilled nursing-short term rehab (<3 hours/day)    Assistance Recommended at Discharge Frequent or constant Supervision/Assistance  Patient can return home with the following Two people to help with walking and/or transfers;A lot of help with bathing/dressing/bathroom;Assistance with cooking/housework;Assist for transportation    Functional Status Assessment  Patient has had a recent decline in their functional status and demonstrates the ability to make significant improvements in function in a reasonable and predictable amount of time.  Equipment Recommendations  Other (comment) (defer to next venue)    Recommendations for Other Services       Precautions / Restrictions Precautions Precautions: Fall Required Braces or Orthoses: Other Brace Splint/Cast - Date Prophylactic Dressing Applied (if applicable): 62/95/28 Other Brace: L thumb spice splint      Mobility Bed Mobility Overal bed mobility: Needs Assistance Bed Mobility: Supine to Sit, Sit to Supine     Supine to sit: Mod assist Sit to supine: Mod assist, +2 for physical assistance   General bed mobility comments: pt able to perform semi-supine to sitting with increased time/effort and use of bed rail but required mod assist (using bed pad) to scoot forward towards edge of bed; assist for trunk and B LE's sit to semi-supine in bed; vc's for technique    Transfers Overall transfer level: Needs assistance Equipment used: Left platform walker Transfers: Sit to/from Stand Sit to Stand: Mod assist, +2 physical assistance           General transfer comment: unable to stand 1st trial with 2 assist; able to stand up to L platform RW with mod assist x2 2nd trial (pt stood for 3 minutes for orthostatics);  and pt unable to stand up on 3rd and 4th trial with max assist x2; vc's for UE/LE placement, scooting to edge of bed, and overall technique; assist to  initiate stand and control descent sitting      Balance Overall balance assessment: Needs assistance Sitting-balance support: Single extremity supported, Feet supported Sitting balance-Leahy Scale: Fair Sitting balance - Comments: steady static sitting   Standing balance support: Bilateral upper extremity supported, Reliant on assistive device for balance Standing balance-Leahy Scale: Poor Standing balance comment: CGA to min assist x2 for standing balance with L platform RW use; posterior lean noted                           ADL either performed or assessed with clinical judgement   ADL                                         General ADL Comments: Pt requires MAX A for LB ADL, MOD A for UB ADL tasks, and MOD A +2 for ADL transfers, limited standing tolerance.     Vision         Perception     Praxis      Pertinent Vitals/Pain Pain Assessment Pain Assessment: 0-10 Pain Score: 8  Pain Location: posterior neck and low back Pain Descriptors / Indicators: Aching, Constant, Guarding Pain Intervention(s): Limited activity within patient's tolerance, Monitored during session, Premedicated before session, Repositioned     Hand Dominance     Extremity/Trunk Assessment Upper Extremity Assessment Upper Extremity Assessment: Generalized weakness;LUE deficits/detail LUE Deficits / Details: L wrist wrapped/immobilized, hx L shoulder dislocation with resulting limited shoulder ROM   Lower Extremity Assessment Lower Extremity Assessment: Defer to PT evaluation RLE Deficits / Details: R LE externally rotated (chronic positioning); at least 3/5 AROM hip flexion, knee flexion/extension, and DF/PF LLE Deficits / Details: at least 3/5 AROM hip flexion, knee flexion/extension and DF/PF   Cervical / Trunk Assessment Cervical / Trunk Assessment: Other exceptions Cervical / Trunk Exceptions: forward head/shoulders   Communication Communication Communication:  No difficulties   Cognition Arousal/Alertness: Awake/alert Behavior During Therapy: Anxious Overall Cognitive Status: Within Functional Limits for tasks assessed                                       General Comments  L thumb spica splint in place    Exercises Other Exercises Other Exercises: Pt instucted in RW mgt and ADL transfers techniques   Shoulder Instructions      Home Living Family/patient expects to be discharged to:: Skilled nursing facility Living Arrangements: Alone Available Help at Discharge: Friend(s);Available PRN/intermittently Type of Home: House Home Access: Level entry     Home Layout: One level     Bathroom Shower/Tub: Occupational psychologist: Handicapped height (toilet riser)     Home Equipment: Conservation officer, nature (2 wheels);Wheelchair - manual;Grab bars - tub/shower   Additional Comments: Uses [employer] for medical transportation      Prior Functioning/Environment Prior Level of Function : Needs assist             Mobility Comments: Ambulates with RW in home and uses manual w/c for community mobility.  H/o falls (but not very often). ADLs Comments: Friends come to assist (around 10am-5pm)  OT Problem List: Pain;Decreased strength;Decreased range of motion;Decreased activity tolerance;Impaired balance (sitting and/or standing);Decreased knowledge of use of DME or AE;Obesity;Impaired UE functional use      OT Treatment/Interventions: Self-care/ADL training;Therapeutic exercise;Therapeutic activities;Patient/family education;DME and/or AE instruction;Balance training    OT Goals(Current goals can be found in the care plan section) Acute Rehab OT Goals Patient Stated Goal: get better OT Goal Formulation: With patient Time For Goal Achievement: 11/04/21 Potential to Achieve Goals: Good ADL Goals Pt Will Perform Upper Body Dressing: with min assist;sitting Pt Will Perform Lower Body Dressing: with mod assist;sit  to/from stand Pt Will Transfer to Toilet: with mod assist;stand pivot transfer;bedside commode Pt Will Perform Toileting - Clothing Manipulation and hygiene: with supervision;with set-up;sitting/lateral leans Additional ADL Goal #1: Pt will complete bed mobility with supervision, 3/3 opportunities.  OT Frequency: Min 2X/week    Co-evaluation PT/OT/SLP Co-Evaluation/Treatment: Yes Reason for Co-Treatment: For patient/therapist safety;To address functional/ADL transfers PT goals addressed during session: Mobility/safety with mobility;Balance;Proper use of DME OT goals addressed during session: ADL's and self-care      AM-PAC OT "6 Clicks" Daily Activity     Outcome Measure Help from another person eating meals?: None Help from another person taking care of personal grooming?: A Little Help from another person toileting, which includes using toliet, bedpan, or urinal?: Total Help from another person bathing (including washing, rinsing, drying)?: A Lot Help from another person to put on and taking off regular upper body clothing?: A Lot Help from another person to put on and taking off regular lower body clothing?: A Lot 6 Click Score: 14   End of Session Equipment Utilized During Treatment: Gait belt;Rolling walker (2 wheels)  Activity Tolerance: Patient tolerated treatment well Patient left: in bed;with call bell/phone within reach;with bed alarm set  OT Visit Diagnosis: Other abnormalities of gait and mobility (R26.89);Muscle weakness (generalized) (M62.81);Pain Pain - Right/Left: Left Pain - part of body: Hand                Time: 7619-5093 OT Time Calculation (min): 40 min Charges:  OT General Charges $OT Visit: 1 Visit OT Evaluation $OT Eval Moderate Complexity: 1 Mod OT Treatments $Self Care/Home Management : 8-22 mins  Ardeth Perfect., MPH, MS, OTR/L ascom [phone] 10/21/21, 1:47 PM

## 2021-10-21 NOTE — Progress Notes (Signed)
       CROSS COVER NOTE  NAME: Theresa Mills MRN: [id number] DOB : Aug 11, 1950    HPI/Events of Note   Asymptomatic bradycardia to 37-40, brief drops per telemetry  Assessment and  Interventions   Assessment: Asymptomatic bradycardia.  Patient not on any rate control agents or AV nodal blockers  Plan: Continue cardiac monitoring.  No additional intervention for now X X

## 2021-10-21 NOTE — Assessment & Plan Note (Signed)
Frequent falls.  Patient states her ankles and knees gave out.  Right ankle chronically deviated out.  Physical therapy recommends rehab.  Unsafe discharge home at this point.  Needs a 3 night stay for rehab.

## 2021-10-21 NOTE — Assessment & Plan Note (Addendum)
Continue levothyroxine at increased dose 137 mcg secondary to elevated TSH of 7.503.

## 2021-10-22 DIAGNOSIS — F419 Anxiety disorder, unspecified: Secondary | ICD-10-CM

## 2021-10-22 DIAGNOSIS — R531 Weakness: Secondary | ICD-10-CM | POA: Diagnosis not present

## 2021-10-22 DIAGNOSIS — R001 Bradycardia, unspecified: Secondary | ICD-10-CM | POA: Diagnosis not present

## 2021-10-22 DIAGNOSIS — N390 Urinary tract infection, site not specified: Secondary | ICD-10-CM | POA: Diagnosis not present

## 2021-10-22 DIAGNOSIS — S63519S Sprain of carpal joint of unspecified wrist, sequela: Secondary | ICD-10-CM | POA: Diagnosis not present

## 2021-10-22 DIAGNOSIS — B9689 Other specified bacterial agents as the cause of diseases classified elsewhere: Secondary | ICD-10-CM

## 2021-10-22 DIAGNOSIS — F32A Depression, unspecified: Secondary | ICD-10-CM

## 2021-10-22 LAB — GLUCOSE, CAPILLARY
Glucose-Capillary: 90 mg/dL (ref 70–99)
Glucose-Capillary: 107 mg/dL — ABNORMAL HIGH (ref 70–99)
Glucose-Capillary: 114 mg/dL — ABNORMAL HIGH (ref 70–99)

## 2021-10-22 MED ORDER — LEVOTHYROXINE SODIUM 137 MCG PO TABS
137.0000 ug | ORAL_TABLET | Freq: Every day | ORAL | Status: DC
  Administered 2021-10-23 – 2021-10-27 (×5): 137 ug via ORAL
  Filled 2021-10-22 (×5): qty 1

## 2021-10-22 NOTE — Assessment & Plan Note (Addendum)
Klebsiella growing out of urine culture.  Patient received a dose of fosfomycin in the ER.  We will start IV Rocephin, since patient is still very weak.  I think this is contributing to her legs giving out on her and her generalized weakness and unable to walk.

## 2021-10-22 NOTE — Assessment & Plan Note (Addendum)
Acute episodes of bradycardia in the afternoon of 9/29 and evening of 9/29.  Patient not on any rate controlling medications.

## 2021-10-22 NOTE — Plan of Care (Signed)

## 2021-10-22 NOTE — Progress Notes (Signed)
Progress Note   Patient: Theresa Mills FBP:[id number] DOB: 1950-06-18 DOA: 10/20/2021     0 DOS: the patient was seen and examined on 10/22/2021    Assessment and Plan: * Generalized weakness Frequent falls.  Patient states her ankles and knees gave out.  Physical therapy recommends rehab.  Needed 2 people just to try to stand up.  Unsafe discharge home at this point  Sinus bradycardia Acute episodes of bradycardia yesterday afternoon and overnight.  Continue to monitor on telemetry.  Sprain of scapholunate ligament, sequela Patient was placed in a splint in the ED. Case discussed with Dr. Posey Pronto orthopedics and okay to follow-up as outpatient.  UTI due to Klebsiella species Klebsiella growing out of urine culture.  Patient received a dose of fosfomycin in the ER.  Await sensitivities.  Hypothyroidism, unspecified Continue levothyroxine at increased dose 137 mcg secondary to elevated TSH of 7.503.  Parkinson disease ([place]) Continue Sinemet and ropinirole  Diabetes mellitus type 2, uncomplicated (HCC) Continue metformin Hemoglobin A1c low at 5.3.  Discontinue sliding scale insulin and change fingersticks to daily.  Chronic back pain History of lumbar osteomyelitis July 2021 History of osteoporotic L1 fracture September 2022 Continue Tylenol and Celebrex Patient also takes Percocet at home  Anxiety and depression Continue Zoloft        Subjective: Yesterday afternoon I was called with bradycardia episode, overnight had another bradycardic episode.  Patient feels sore all over secondary to arthritis.  Unable to do too much yesterday with physical therapy.  Physical Exam: Vitals:   10/22/21 0524 10/22/21 0803 10/22/21 1119 10/22/21 1638  BP: 110/72 119/83 (!) 104/40 (!) 122/55  Pulse: 68 63 64 66  Resp: '17 18 16 17  '$ Temp: 98.1 F (36.7 C) 97.7 F (36.5 C) 98 F (36.7 C) 98.2 F (36.8 C)  TempSrc:   Oral Oral  SpO2: 94% 94% 92% 94%  Weight:      Height:        Physical Exam HENT:     Head: Normocephalic.     Mouth/Throat:     Pharynx: No oropharyngeal exudate.  Eyes:     General: Lids are normal.     Conjunctiva/sclera: Conjunctivae normal.  Cardiovascular:     Rate and Rhythm: Normal rate and regular rhythm.     Heart sounds: Normal heart sounds, S1 normal and S2 normal.  Pulmonary:     Breath sounds: Normal breath sounds. No decreased breath sounds, wheezing, rhonchi or rales.  Abdominal:     Palpations: Abdomen is soft.     Tenderness: There is no abdominal tenderness.  Musculoskeletal:     Right lower leg: Swelling present.     Left lower leg: Swelling present.     Comments: Right ankle turned out  Skin:    General: Skin is warm.     Findings: No rash.  Neurological:     Mental Status: She is alert and oriented to person, place, and time.     Comments: Patient able to wiggle her fingers left hand.  Patient able to straight leg raise.  No resting tremor seen when I saw her.     Data Reviewed: Hemoglobin A1c 5.3, last creatinine 0.61, last hemoglobin 13.0, TSH 7.53  Family Communication: Updated patient's sister on the phone  Disposition: Status is: Observation Too weak to go home at this point physical therapy recommending rehab.  As per TOC, unable to go out to rehab unless changed to inpatient. Planned Discharge Destination: Rehab  Time spent: 28 minutes  Author: Loletha Grayer, MD 10/22/2021 4:55 PM  For on call review [email or web address].

## 2021-10-23 DIAGNOSIS — W19XXXA Unspecified fall, initial encounter: Secondary | ICD-10-CM | POA: Diagnosis present

## 2021-10-23 DIAGNOSIS — I10 Essential (primary) hypertension: Secondary | ICD-10-CM

## 2021-10-23 DIAGNOSIS — E669 Obesity, unspecified: Secondary | ICD-10-CM | POA: Diagnosis present

## 2021-10-23 DIAGNOSIS — I441 Atrioventricular block, second degree: Secondary | ICD-10-CM | POA: Diagnosis present

## 2021-10-23 DIAGNOSIS — Y92009 Unspecified place in unspecified non-institutional (private) residence as the place of occurrence of the external cause: Secondary | ICD-10-CM | POA: Diagnosis not present

## 2021-10-23 DIAGNOSIS — E039 Hypothyroidism, unspecified: Secondary | ICD-10-CM | POA: Diagnosis present

## 2021-10-23 DIAGNOSIS — E119 Type 2 diabetes mellitus without complications: Secondary | ICD-10-CM | POA: Diagnosis present

## 2021-10-23 DIAGNOSIS — N390 Urinary tract infection, site not specified: Secondary | ICD-10-CM | POA: Diagnosis present

## 2021-10-23 DIAGNOSIS — G8929 Other chronic pain: Secondary | ICD-10-CM | POA: Diagnosis present

## 2021-10-23 DIAGNOSIS — M549 Dorsalgia, unspecified: Secondary | ICD-10-CM | POA: Diagnosis present

## 2021-10-23 DIAGNOSIS — R001 Bradycardia, unspecified: Secondary | ICD-10-CM | POA: Diagnosis present

## 2021-10-23 DIAGNOSIS — R296 Repeated falls: Secondary | ICD-10-CM | POA: Diagnosis present

## 2021-10-23 DIAGNOSIS — M810 Age-related osteoporosis without current pathological fracture: Secondary | ICD-10-CM | POA: Diagnosis present

## 2021-10-23 DIAGNOSIS — Z66 Do not resuscitate: Secondary | ICD-10-CM | POA: Diagnosis present

## 2021-10-23 DIAGNOSIS — F32A Depression, unspecified: Secondary | ICD-10-CM | POA: Diagnosis present

## 2021-10-23 DIAGNOSIS — Z87440 Personal history of urinary (tract) infections: Secondary | ICD-10-CM | POA: Diagnosis not present

## 2021-10-23 DIAGNOSIS — L89152 Pressure ulcer of sacral region, stage 2: Secondary | ICD-10-CM | POA: Diagnosis present

## 2021-10-23 DIAGNOSIS — Z888 Allergy status to other drugs, medicaments and biological substances status: Secondary | ICD-10-CM | POA: Diagnosis not present

## 2021-10-23 DIAGNOSIS — E785 Hyperlipidemia, unspecified: Secondary | ICD-10-CM | POA: Diagnosis present

## 2021-10-23 DIAGNOSIS — F419 Anxiety disorder, unspecified: Secondary | ICD-10-CM | POA: Diagnosis present

## 2021-10-23 DIAGNOSIS — B9689 Other specified bacterial agents as the cause of diseases classified elsewhere: Secondary | ICD-10-CM | POA: Diagnosis not present

## 2021-10-23 DIAGNOSIS — B961 Klebsiella pneumoniae [K. pneumoniae] as the cause of diseases classified elsewhere: Secondary | ICD-10-CM | POA: Diagnosis present

## 2021-10-23 DIAGNOSIS — G20B2 Parkinson's disease with dyskinesia, with fluctuations: Secondary | ICD-10-CM

## 2021-10-23 DIAGNOSIS — G20A1 Parkinson's disease without dyskinesia, without mention of fluctuations: Secondary | ICD-10-CM | POA: Diagnosis present

## 2021-10-23 DIAGNOSIS — K219 Gastro-esophageal reflux disease without esophagitis: Secondary | ICD-10-CM | POA: Diagnosis present

## 2021-10-23 DIAGNOSIS — S63519S Sprain of carpal joint of unspecified wrist, sequela: Secondary | ICD-10-CM | POA: Diagnosis not present

## 2021-10-23 DIAGNOSIS — Z6833 Body mass index (BMI) 33.0-33.9, adult: Secondary | ICD-10-CM | POA: Diagnosis not present

## 2021-10-23 DIAGNOSIS — G4730 Sleep apnea, unspecified: Secondary | ICD-10-CM | POA: Diagnosis present

## 2021-10-23 DIAGNOSIS — R531 Weakness: Secondary | ICD-10-CM | POA: Diagnosis present

## 2021-10-23 DIAGNOSIS — Z885 Allergy status to narcotic agent status: Secondary | ICD-10-CM | POA: Diagnosis not present

## 2021-10-23 LAB — URINE CULTURE: Culture: >=100000 — AB

## 2021-10-23 LAB — GLUCOSE, CAPILLARY: Glucose-Capillary: 101 mg/dL — ABNORMAL HIGH (ref 70–99)

## 2021-10-23 MED ORDER — SODIUM CHLORIDE 0.9 % IV SOLN
1.0000 g | INTRAVENOUS | Status: DC
  Administered 2021-10-23 – 2021-10-25 (×3): 1 g via INTRAVENOUS
  Filled 2021-10-23: qty 1
  Filled 2021-10-23 (×2): qty 10

## 2021-10-23 MED ORDER — POLYETHYLENE GLYCOL 3350 17 G PO PACK
17.0000 g | PACK | Freq: Every day | ORAL | Status: DC
  Administered 2021-10-23 – 2021-10-27 (×3): 17 g via ORAL
  Filled 2021-10-23 (×4): qty 1

## 2021-10-23 NOTE — Care Management Obs Status (Signed)
[facility] NOTIFICATION   Patient Details  Name: Theresa Mills MRN: [id number] Date of Birth: [birth year]   Medicare Observation Status Notification Given:  Yes    Justeen Hehr E Arun Herrod, LCSW 10/23/2021, 9:55 AM

## 2021-10-23 NOTE — TOC Initial Note (Addendum)
Transition of Care [facility]) - Initial/Assessment Note    Patient Details  Name: Theresa Mills MRN: [id number] Date of Birth: 1950/01/26  Transition of Care [facility]) CM/SW Contact:    Magnus Ivan, LCSW Phone Number: 10/23/2021, 9:57 AM  Clinical Narrative:                 CSW met with patient at bedside. Patient is in obs status with SNF rec. CSW has spoken to UR and MD on 9/30 and reached out again on 10/1 to determine if patient will change. As of 9/30, UR stated patient would stay in obs.   Patient lives alone. She has friends who can help her intermittently. PCP is Dr. Humphrey Rolls. Pharmacy is [facility]. Patient has had [facility] In the past, patient unsure of agency used. Patient has a walker and wheelchair at home. Patient went to SNF in the past, she could not remember name of SNF. Per notes, she went to [facility] in the past. Patient uses [employer] for transportation.   CSW explained to patient that her Medicare would not cover SNF if she is on obs. Patient states she does not feel safe returning home and does not have anyone who could stay with her. Patient states she would want to go to SNF if possible. Patient requested CSW follow up with her friend Dahlia Client. CSW will call Vaughan Basta today after hearing back from UR.   2:25- Reached out to UR and MD again. Per MD, patient will change to inpatient status.   Called patient's friend Vaughan Basta per patient's request. Vaughan Basta stated patient does have private pay sitters but they refuse to work with her until she can walk as they don't feel they can meet her needs at home. CSW asked which agency these sitters are through as patient said no one could stay with her, Vaughan Basta stated it is friends that patient pays to help her, and again stated they would not feel comfortable helping her until she gets some rehab at a SNF. Vaughan Basta stated patient went to [facility] in the past for SNF and would want to go back there.  CSW starting SNF work up.      Barriers to  Discharge: Continued Medical Work up   Patient Goals and CMS Choice Patient states their goals for this hospitalization and ongoing recovery are:: feels she needs rehab CMS Medicare.gov Compare Post Acute Care list provided to:: Patient Choice offered to / list presented to : Patient  Expected Discharge Plan and Services         Living arrangements for the past 2 months: Single Family Home                                      Prior Living Arrangements/Services Living arrangements for the past 2 months: [place] with:: Self Patient language and need for interpreter reviewed:: Yes Do you feel safe going back to the place where you live?: Yes      Need for Family Participation in Patient Care: Yes (Comment) Care giver support system in place?: Yes (comment) Current home services: DME Criminal Activity/Legal Involvement Pertinent to Current Situation/Hospitalization: No - Comment as needed  Activities of Daily Living Home Assistive Devices/Equipment: Gilford Rile (specify type) ADL Screening (condition at time of admission) Patient's cognitive ability adequate to safely complete daily activities?: Yes Is the patient deaf or have difficulty hearing?: No Does  the patient have difficulty seeing, even when wearing glasses/contacts?: No Does the patient have difficulty concentrating, remembering, or making decisions?: No Patient able to express need for assistance with ADLs?: Yes Does the patient have difficulty dressing or bathing?: Yes Independently performs ADLs?: No Communication: Independent Dressing (OT): Dependent Is this a change from baseline?: Change from baseline, expected to last <3days Grooming: Dependent Is this a change from baseline?: Change from baseline, expected to last <3 days Feeding: Needs assistance Is this a change from baseline?: Change from baseline, expected to last <3 days Bathing: Dependent Is this a change from baseline?: Change  from baseline, expected to last <3 days Toileting: Dependent Is this a change from baseline?: Change from baseline, expected to last >3days In/Out Bed: Dependent Is this a change from baseline?: Change from baseline, expected to last <3 days Walks in Home: Dependent Is this a change from baseline?: Change from baseline, expected to last <3 days Does the patient have difficulty walking or climbing stairs?: Yes Weakness of Legs: Both Weakness of Arms/Hands: Left  Permission Sought/Granted Permission sought to share information with : Facility Sport and exercise psychologist, Family Supports Permission granted to share information with : Yes, Verbal Permission Granted  Share Information with NAME: Dahlia Client - friend  Permission granted to share info w AGENCY: SNFs, [place], DME agencies        Emotional Assessment       Orientation: : Oriented to Self, Oriented to Place, Oriented to  Time, Oriented to Situation Alcohol / Substance Use: Not Applicable Psych Involvement: No (comment)  Admission diagnosis:  Weakness [R53.1] Ambulatory dysfunction [R26.2] Patient Active Problem List   Diagnosis Date Noted   Sinus bradycardia 10/22/2021   Ambulatory dysfunction 10/20/2021   Generalized weakness 10/20/2021   Sprain of scapholunate ligament, sequela 10/20/2021   Compression fracture of lumbar vertebra ([address]) 10/10/2020   Major depression in remission ([place]) 09/23/2020   Hav (hallux abducto valgus), unspecified laterality 09/23/2020   Chronic back pain 09/30/2019   Epidural abscess 09/30/2019   Psoas abscess, right ([place]) 09/30/2019   High anion gap metabolic acidosis 75/10/2583   Lumbar compression fracture ([place]) 08/25/2019   Osteomyelitis of lumbar vertebra ([place]) 08/25/2019   Discitis 08/25/2019   Malnutrition of moderate degree 08/20/2019   Severe sepsis ([place]) 08/19/2019   Bilateral hydronephrosis 08/19/2019   Acute pyelonephritis 27/78/2423   Acute metabolic encephalopathy 53/61/4431    Acute urinary retention 08/19/2019   AKI (acute kidney injury) ([place]) 08/19/2019   Osteoarthritis of multiple joints    Frequent falls 08/05/2019   UTI due to Klebsiella species 06/11/2019   Nausea and vomiting 02/13/2019   Acquired bilateral hammer toes 01/23/2019   Type 2 diabetes mellitus with hyperglycemia ([place]) 09/15/2018   Encounter for general adult medical examination with abnormal findings 09/05/2017   Dysuria 09/05/2017   Uncontrolled type 2 diabetes mellitus with hyperglycemia ([place]) 06/18/2017   Rib fractures 06/11/2017   Pressure injury of coccygeal region, stage 2 ([place]) 06/11/2017   Rhabdomyolysis 06/10/2017   Elevated troponin 06/10/2017   Hypernatremia 06/10/2017   Arthritis 01/08/2017   Cataracts, bilateral 01/08/2017   Chickenpox 01/08/2017   Shingles 01/08/2017   Anxiety and depression 01/08/2017   Diabetes mellitus type 2, uncomplicated ([place]) 54/00/8676   Hyperlipidemia, unspecified 01/08/2017   Essential hypertension 01/08/2017   Migraine headache 01/08/2017   Hypothyroidism, unspecified 01/08/2017   Parkinson disease 01/08/2017   Anxiety, generalized 09/01/2016   Dementia due to Parkinson's disease without behavioral disturbance ([place]) 09/01/2016   History of  depression 09/01/2016   Morbid obesity with BMI of 40.0-44.9, adult ([place]) 09/01/2016   Restless leg syndrome 09/01/2016   Class 2 severe obesity with serious comorbidity in adult [facility]) 09/01/2016   PCP:  Jonetta Osgood, NP Pharmacy:   [place], [place] - [address] [place] [address] Phone: [phone] Fax: [phone]  [place], [place] [address] [place] [address] Phone: [phone] Fax: [phone]     Social Determinants of Health (SDOH) Interventions    Readmission Risk Interventions    08/29/2019    8:34 AM  Readmission Risk Prevention Plan  Transportation Screening Complete  PCP or  Specialist Appt within 3-5 Days Complete  HRI or [place] Complete  Social Work Consult for [place] Planning/Counseling Complete  Palliative Care Screening Not Applicable  Medication Review Press photographer) Complete

## 2021-10-23 NOTE — NC FL2 (Signed)
[place] LEVEL OF CARE SCREENING TOOL     IDENTIFICATION  Patient Name: Theresa Mills Birthdate: Apr 22, 1950 Sex: female Admission Date (Current Location): 10/20/2021  [facility] and [place] Number:  Engineering geologist and Address:  [facility], [address], [place], [place] [address]      Provider Number: [id number]  Attending Physician Name and Address:  Loletha Grayer, MD  Relative Name and Phone Number:  Delrae Sawyers [phone]    Current Level of Care: Hospital Recommended Level of Care: [place] Prior Approval Number:    Date Approved/Denied:   PASRR Number: [id number]  Discharge Plan:      Current Diagnoses: Patient Active Problem List   Diagnosis Date Noted   UTI (urinary tract infection) 10/23/2021   Sinus bradycardia 10/22/2021   Ambulatory dysfunction 10/20/2021   Generalized weakness 10/20/2021   Sprain of scapholunate ligament, sequela 10/20/2021   Compression fracture of lumbar vertebra ([place]) 10/10/2020   Major depression in remission ([place]) 09/23/2020   Hav (hallux abducto valgus), unspecified laterality 09/23/2020   Chronic back pain 09/30/2019   Epidural abscess 09/30/2019   Psoas abscess, right ([place]) 09/30/2019   High anion gap metabolic acidosis 22/48/2500   Lumbar compression fracture ([place]) 08/25/2019   Osteomyelitis of lumbar vertebra ([place]) 08/25/2019   Discitis 08/25/2019   Malnutrition of moderate degree 08/20/2019   Severe sepsis ([place]) 08/19/2019   Bilateral hydronephrosis 08/19/2019   Acute pyelonephritis 37/04/8887   Acute metabolic encephalopathy 16/94/5038   Acute urinary retention 08/19/2019   AKI (acute kidney injury) ([place]) 08/19/2019   Osteoarthritis of multiple joints    Frequent falls 08/05/2019   UTI due to Klebsiella species 06/11/2019   Nausea and vomiting 02/13/2019   Acquired bilateral hammer toes 01/23/2019   Type 2 diabetes mellitus with  hyperglycemia ([place]) 09/15/2018   Encounter for general adult medical examination with abnormal findings 09/05/2017   Dysuria 09/05/2017   Uncontrolled type 2 diabetes mellitus with hyperglycemia ([place]) 06/18/2017   Rib fractures 06/11/2017   Pressure injury of coccygeal region, stage 2 ([place]) 06/11/2017   Rhabdomyolysis 06/10/2017   Elevated troponin 06/10/2017   Hypernatremia 06/10/2017   Arthritis 01/08/2017   Cataracts, bilateral 01/08/2017   Chickenpox 01/08/2017   Shingles 01/08/2017   Anxiety and depression 01/08/2017   Diabetes mellitus type 2, uncomplicated ([place]) 88/28/0034   Hyperlipidemia, unspecified 01/08/2017   Essential hypertension 01/08/2017   Migraine headache 01/08/2017   Hypothyroidism, unspecified 01/08/2017   Parkinson disease 01/08/2017   Anxiety, generalized 09/01/2016   Dementia due to Parkinson's disease without behavioral disturbance ([place]) 09/01/2016   History of depression 09/01/2016   Morbid obesity with BMI of 40.0-44.9, adult ([place]) 09/01/2016   Restless leg syndrome 09/01/2016   Class 2 severe obesity with serious comorbidity in adult ([place]) 09/01/2016    Orientation RESPIRATION BLADDER Height & Weight     Self, Time, Situation, Place  Normal Incontinent, Indwelling catheter Weight: 190 lb 4.1 oz (86.3 kg) Height:  '5\' 3"'$  (160 cm)  BEHAVIORAL SYMPTOMS/MOOD NEUROLOGICAL BOWEL NUTRITION STATUS      Incontinent Diet (heart healthy/carb modified)  AMBULATORY STATUS COMMUNICATION OF NEEDS Skin   Extensive Assist Verbally Other (Comment) (stage 2 medial coccyx, blister R vagina)                       Personal Care Assistance Level of Assistance  Bathing, Feeding, Dressing Bathing Assistance: Maximum assistance Feeding assistance: Limited assistance Dressing Assistance: Maximum assistance  Functional Limitations Info             SPECIAL CARE FACTORS FREQUENCY  PT (By licensed PT), OT (By licensed OT)     PT Frequency: 5 times per  week OT Frequency: 5 times per week            Contractures      Additional Factors Info  Code Status, Allergies Code Status Info: DNR Allergies Info: Codeine, Ephadrene (Cholestatin), Other, Valium (Diazepam), Ephedrine           Current Medications (10/23/2021):  This is the current hospital active medication list Current Facility-Administered Medications  Medication Dose Route Frequency Provider Last Rate Last Admin   acetaminophen (TYLENOL) tablet 1,000 mg  1,000 mg Oral Q6H PRN Sharion Settler, NP   1,000 mg at 10/21/21 8338   Or   acetaminophen (TYLENOL) suppository 650 mg  650 mg Rectal Q6H PRN Sharion Settler, NP       calcium-vitamin D (OSCAL WITH D) 500-5 MG-MCG per tablet 1 tablet  1 tablet Oral Daily Athena Masse, MD   1 tablet at 10/23/21 2505   carbidopa-levodopa (SINEMET IR) 25-100 MG per tablet immediate release 2 tablet  2 tablet Oral QID Athena Masse, MD   2 tablet at 10/23/21 1418   cefTRIAXone (ROCEPHIN) 1 g in sodium chloride 0.9 % 100 mL IVPB  1 g Intravenous Q24H Wieting, Richard, MD       celecoxib (CELEBREX) capsule 200 mg  200 mg Oral BID PRN Athena Masse, MD       enoxaparin (LOVENOX) injection 40 mg  40 mg Subcutaneous Q24H Judd Gaudier V, MD   40 mg at 10/22/21 2019   levothyroxine (SYNTHROID) tablet 137 mcg  137 mcg Oral Daily Loletha Grayer, MD   137 mcg at 10/23/21 0555   melatonin tablet 5 mg  5 mg Oral QHS PRN Athena Masse, MD   5 mg at 10/22/21 2016   metFORMIN (GLUCOPHAGE) tablet 500 mg  500 mg Oral BID WC Athena Masse, MD   500 mg at 10/23/21 3976   multivitamin with minerals tablet 1 tablet  1 tablet Oral Daily Athena Masse, MD   1 tablet at 10/23/21 0823   ondansetron (ZOFRAN) tablet 4 mg  4 mg Oral Q6H PRN Athena Masse, MD   4 mg at 10/21/21 0755   Or   ondansetron (ZOFRAN) injection 4 mg  4 mg Intravenous Q6H PRN Athena Masse, MD   4 mg at 10/23/21 7341   oxyCODONE-acetaminophen (PERCOCET/ROXICET) 5-325 MG per  tablet 1 tablet  1 tablet Oral Q6H PRN Loletha Grayer, MD   1 tablet at 10/23/21 0555   pantoprazole (PROTONIX) EC tablet 40 mg  40 mg Oral Daily Judd Gaudier V, MD   40 mg at 10/23/21 0823   polyethylene glycol (MIRALAX / GLYCOLAX) packet 17 g  17 g Oral Daily Loletha Grayer, MD   17 g at 10/23/21 1107   rOPINIRole (REQUIP) tablet 2 mg  2 mg Oral QHS Athena Masse, MD   2 mg at 10/22/21 2016   senna-docusate (Senokot-S) tablet 1 tablet  1 tablet Oral QHS PRN Athena Masse, MD       sertraline (ZOLOFT) tablet 100 mg  100 mg Oral Daily Judd Gaudier V, MD   100 mg at 10/23/21 9379   simethicone (MYLICON) chewable tablet 80 mg  80 mg Oral QID PRN Loletha Grayer, MD  simvastatin (ZOCOR) tablet 40 mg  40 mg Oral q1800 Athena Masse, MD   40 mg at 10/22/21 1710     Discharge Medications: Please see discharge summary for a list of discharge medications.  Relevant Imaging Results:  Relevant Lab Results:   Additional Information SS #: [id number]  [place], LCSW

## 2021-10-23 NOTE — Progress Notes (Signed)
Progress Note   Patient: Theresa Mills VOZ:[id number] DOB: 1950/07/18 DOA: 10/20/2021     0 DOS: the patient was seen and examined on 10/23/2021     Assessment and Plan: * UTI due to Klebsiella species Klebsiella growing out of urine culture.  Patient received a dose of fosfomycin in the ER.  We will start IV Rocephin, since patient is still very weak.  I think this is contributing to her legs giving out on her and her generalized weakness and unable to walk.  Generalized weakness Frequent falls.  Patient states her ankles and knees gave out.  Right ankle chronically deviated out.  Physical therapy recommends rehab.  Needed 2 people just to try to stand up.  Unsafe discharge home at this point.  Sinus bradycardia Acute episodes of bradycardia in the afternoon of 9/29 and evening of 9/29.  Patient not on any rate controlling medications.  Sprain of scapholunate ligament, sequela Patient was placed in a splint in the ED. Case discussed with Dr. Posey Pronto orthopedics and okay to follow-up as outpatient.  Hypothyroidism, unspecified Continue levothyroxine at increased dose 137 mcg secondary to elevated TSH of 7.503.  Parkinson disease Continue Sinemet and ropinirole  Diabetes mellitus type 2, uncomplicated (HCC) Continue metformin Hemoglobin A1c low at 5.3.    Chronic back pain History of lumbar osteomyelitis July 2021 History of osteoporotic L1 fracture September 2022 Continue Tylenol and Celebrex Patient also takes Percocet at home If no improvement with ambulation tomorrow with physical therapy may consider MRI of the lumbar spine.  Anxiety and depression Continue Zoloft       Subjective: Patient feels weak.  Has some intermittent nausea.  Urine culture came back positive for Klebsiella.  Received a dose of fosfomycin in the emergency room.  Will start Rocephin.  Physical Exam: Vitals:   10/23/21 0020 10/23/21 0455 10/23/21 0730 10/23/21 1154  BP: 122/68 138/74 136/77 (!)  107/59  Pulse: 68 67 61 (!) 58  Resp: '18 18 17 17  '$ Temp: 97.9 F (36.6 C) (!) 97.4 F (36.3 C) 97.9 F (36.6 C) 98.5 F (36.9 C)  TempSrc:      SpO2: 93% 95% 93% 92%  Weight:      Height:       Physical Exam HENT:     Head: Normocephalic.     Mouth/Throat:     Pharynx: No oropharyngeal exudate.  Eyes:     General: Lids are normal.     Conjunctiva/sclera: Conjunctivae normal.  Cardiovascular:     Rate and Rhythm: Normal rate and regular rhythm.     Heart sounds: Normal heart sounds, S1 normal and S2 normal.  Pulmonary:     Breath sounds: Normal breath sounds. No decreased breath sounds, wheezing, rhonchi or rales.  Abdominal:     Palpations: Abdomen is soft.     Tenderness: There is no abdominal tenderness.  Musculoskeletal:     Right lower leg: Swelling present.     Left lower leg: Swelling present.     Comments: Right ankle turned out  Skin:    General: Skin is warm.     Findings: No rash.  Neurological:     Mental Status: She is alert and oriented to person, place, and time.     Comments: Patient able to wiggle her fingers left hand.  Patient able to straight leg raise.  No resting tremor seen when I saw her.      Data Reviewed: Klebsiella UTI 100,000 colonies.  Family Communication: Spoke  with friend on the phone  Disposition: Status is: Observation Changed to inpatient.  Start IV Rocephin for Klebsiella UTI.  Received fosfomycin in the ER.  Planned Discharge Destination: Rehab    Time spent: 28 minutes  Author: Loletha Grayer, MD 10/23/2021 2:43 PM  For on call review [email or web address].

## 2021-10-24 DIAGNOSIS — R001 Bradycardia, unspecified: Secondary | ICD-10-CM | POA: Diagnosis not present

## 2021-10-24 DIAGNOSIS — E669 Obesity, unspecified: Secondary | ICD-10-CM

## 2021-10-24 DIAGNOSIS — E039 Hypothyroidism, unspecified: Secondary | ICD-10-CM | POA: Diagnosis not present

## 2021-10-24 DIAGNOSIS — R531 Weakness: Secondary | ICD-10-CM | POA: Diagnosis not present

## 2021-10-24 DIAGNOSIS — N390 Urinary tract infection, site not specified: Secondary | ICD-10-CM | POA: Diagnosis not present

## 2021-10-24 LAB — COMPREHENSIVE METABOLIC PANEL
ALT: 12 U/L (ref 0–44)
AST: 46 U/L — ABNORMAL HIGH (ref 15–41)
Albumin: 3.2 g/dL — ABNORMAL LOW (ref 3.5–5.0)
Alkaline Phosphatase: 46 U/L (ref 38–126)
Anion gap: 5 (ref 5–15)
BUN: 18 mg/dL (ref 8–23)
CO2: 30 mmol/L (ref 22–32)
Calcium: 8.6 mg/dL — ABNORMAL LOW (ref 8.9–10.3)
Chloride: 107 mmol/L (ref 98–111)
Creatinine, Ser: 0.59 mg/dL (ref 0.44–1.00)
GFR, Estimated: >60 mL/min (ref >60)
Glucose, Bld: 112 mg/dL — ABNORMAL HIGH (ref 70–99)
Potassium: 3.7 mmol/L (ref 3.5–5.1)
Sodium: 142 mmol/L (ref 135–145)
Total Bilirubin: 0.5 mg/dL (ref 0.3–1.2)
Total Protein: 6.3 g/dL — ABNORMAL LOW (ref 6.5–8.1)

## 2021-10-24 LAB — CBC
HCT: 39.7 % (ref 36.0–46.0)
Hemoglobin: 12.3 g/dL (ref 12.0–15.0)
MCH: 30.8 pg (ref 26.0–34.0)
MCHC: 31 g/dL (ref 30.0–36.0)
MCV: 99.5 fL (ref 80.0–100.0)
Platelets: 204 10*3/uL (ref 150–400)
RBC: 3.99 MIL/uL (ref 3.87–5.11)
RDW: 12.3 % (ref 11.5–15.5)
WBC: 7.1 10*3/uL (ref 4.0–10.5)
nRBC: 0 % (ref 0.0–0.2)

## 2021-10-24 LAB — GLUCOSE, CAPILLARY: Glucose-Capillary: 93 mg/dL (ref 70–99)

## 2021-10-24 NOTE — Progress Notes (Signed)
Physical Therapy Treatment Patient Details Name: Theresa Mills MRN: [id number] DOB: 22-May-1950 Today's Date: 10/24/2021   History of Present Illness Pt is a 71 y.o. female presenting to hospital 9/28 with generalized weakness and unsteadiness; weakness for several months; recent fall onto L hip; c/o B hip pain and worsening chronic pain (neck to her toes).  Imaging showing widening of the scapholunate interval at the L wrist suggesting scapholunate ligament insufficiency/tear.  Pt admitted with fall at home, bacteriuria, sprain of scapholunate ligament, chronic back pain, and hypothyroidism.  PMH includes Parkinson's, thyroid disease, sleep apnea, chronic pain, anxiety, DM, high BP, lumbar compression fx, and RLS.    PT Comments    Pt resting in bed upon therapy arrival; agreeable to therapy session; PT/OT co-session performed.  5-6/10 B knees, ankles, and neck pain during session (pt received pain medication during session).  Max assist x1 to stand from bed; max assist x2 stand pivot bed to [facility] (assist to advance R LE); max assist x2 to stand from lower BSC; mod to max assist x2 stand step turn BSC to bed; vc's for technique and assist for balance required.  Pt assisted back to bed and repositioned for comfort end of session.  Will continue to focus on strengthening, balance, and progressive functional mobility per pt tolerance.   Recommendations for follow up therapy are one component of a multi-disciplinary discharge planning process, led by the attending physician.  Recommendations may be updated based on patient status, additional functional criteria and insurance authorization.  Follow Up Recommendations  Skilled nursing-short term rehab (<3 hours/day) Can patient physically be transported by private vehicle: No   Assistance Recommended at Discharge Frequent or constant Supervision/Assistance  Patient can return home with the following Two people to help with walking and/or transfers;Two  people to help with bathing/dressing/bathroom;Assistance with cooking/housework;Assist for transportation;Help with stairs or ramp for entrance   Equipment Recommendations  BSC/3in1;Wheelchair (measurements PT);Wheelchair cushion (measurements PT);Hospital bed;Other (comment) (L platform RW)    Recommendations for Other Services       Precautions / Restrictions Precautions Precautions: Fall Required Braces or Orthoses: Other Brace Other Brace: L thumb spice splint Restrictions Weight Bearing Restrictions: Yes Other Position/Activity Restrictions: NWBing through wrist, through elbow OK     Mobility  Bed Mobility Overal bed mobility: Needs Assistance Bed Mobility: Supine to Sit, Sit to Supine     Supine to sit: Min assist, Mod assist, +2 for physical assistance Sit to supine: Mod assist, Max assist, +2 for physical assistance   General bed mobility comments: assist for trunk and B LE's; vc's for technique    Transfers Overall transfer level: Needs assistance Equipment used: None Transfers: Sit to/from Stand, Bed to chair/wheelchair/BSC Sit to Stand: Max assist, +2 physical assistance Stand pivot transfers: Mod assist, Max assist, +2 physical assistance         General transfer comment: Max assist x1 to stand from bed; max assist x2 stand pivot bed to [facility] (assist to advance R LE); max assist x2 to stand from lower BSC; mod to max assist x2 stand step turn BSC to bed; vc's for technique and assist for balance required    Ambulation/Gait               General Gait Details: unable at this time   Stairs             Wheelchair Mobility    Modified Rankin (Stroke Patients Only)       Balance Overall balance assessment:  Needs assistance Sitting-balance support: Single extremity supported, Feet supported Sitting balance-Leahy Scale: Fair Sitting balance - Comments: steady static sitting   Standing balance support: Single extremity supported, During  functional activity Standing balance-Leahy Scale: Poor Standing balance comment: assist for balance required                            Cognition Arousal/Alertness: Awake/alert Behavior During Therapy: Anxious Overall Cognitive Status: Within Functional Limits for tasks assessed                                          Exercises      General Comments General comments (skin integrity, edema, etc.): L thumb spica splint in place.  Nursing cleared pt for participation in physical therapy.  Pt agreeable to PT session.      Pertinent Vitals/Pain Pain Assessment Pain Assessment: 0-10 Pain Score: 6  Pain Descriptors / Indicators: Aching, Constant, Guarding Pain Intervention(s): Limited activity within patient's tolerance, Monitored during session, Repositioned, Patient requesting pain meds-RN notified, RN gave pain meds during session Vitals (HR and O2 on room air) stable and WFL throughout treatment session.    Home Living                          Prior Function            PT Goals (current goals can now be found in the care plan section) Acute Rehab PT Goals Patient Stated Goal: to improve mobility and strength PT Goal Formulation: With patient Time For Goal Achievement: 11/04/21 Potential to Achieve Goals: Fair Progress towards PT goals: Progressing toward goals    Frequency    Min 2X/week      PT Plan Current plan remains appropriate    Co-evaluation PT/OT/SLP Co-Evaluation/Treatment: Yes Reason for Co-Treatment: For patient/therapist safety;To address functional/ADL transfers PT goals addressed during session: Mobility/safety with mobility;Balance OT goals addressed during session: ADL's and self-care      AM-PAC PT "6 Clicks" Mobility   Outcome Measure  Help needed turning from your back to your side while in a flat bed without using bedrails?: A Little Help needed moving from lying on your back to sitting on the  side of a flat bed without using bedrails?: Total Help needed moving to and from a bed to a chair (including a wheelchair)?: Total Help needed standing up from a chair using your arms (e.g., wheelchair or bedside chair)?: Total Help needed to walk in hospital room?: Total Help needed climbing 3-5 steps with a railing? : Total 6 Click Score: 8    End of Session Equipment Utilized During Treatment: Gait belt Activity Tolerance: Patient limited by fatigue Patient left: in bed;with call bell/phone within reach;with bed alarm set;Other (comment) (B heels floating via pillow support) Nurse Communication: Mobility status;Precautions PT Visit Diagnosis: Unsteadiness on feet (R26.81);Other abnormalities of gait and mobility (R26.89);Muscle weakness (generalized) (M62.81);History of falling (Z91.81);Pain Pain - Right/Left: Left Pain - part of body: Hand     Time: 5170-0174 PT Time Calculation (min) (ACUTE ONLY): 38 min  Charges:  $Therapeutic Activity: 23-37 mins                    Leitha Bleak, PT 10/24/21, 5:44 PM

## 2021-10-24 NOTE — Assessment & Plan Note (Signed)
BMI 33.70 with current height and weight in computer.

## 2021-10-24 NOTE — Progress Notes (Addendum)
Progress Note   Patient: Theresa Mills BZJ:[id number] DOB: 14-Jun-1950 DOA: 10/20/2021     1 DOS: the patient was seen and examined on 10/24/2021     Assessment and Plan: * UTI due to Klebsiella species Klebsiella growing out of urine culture sensitive to Rocephin.  Patient received a dose of fosfomycin in the ER.  Started IV Rocephin on 10/23/2021.  I think this is contributing to her legs giving out on her and her generalized weakness and unable to walk.  Generalized weakness Frequent falls.  Patient states her ankles and knees gave out.  Right ankle chronically deviated out.  Physical therapy recommends rehab.  Unsafe discharge home at this point.  Needs a 3 night stay for rehab.  Sinus bradycardia Acute episodes of bradycardia in the afternoon of 9/29 and evening of 9/29.  Patient not on any rate controlling medications.  Sprain of scapholunate ligament, sequela Patient was placed in a splint in the ED. Case discussed with Dr. Posey Pronto orthopedics and okay to follow-up as outpatient.  Hypothyroidism, unspecified Continue levothyroxine at increased dose 137 mcg secondary to elevated TSH of 7.503.  Parkinson disease Continue Sinemet and ropinirole.  Check orthostatics if able to do so.  Diabetes mellitus type 2, uncomplicated (HCC) Continue metformin Hemoglobin A1c low at 5.3.    Obesity (BMI 30-39.9) BMI 33.70 with current height and weight in computer.  Chronic back pain History of lumbar osteomyelitis July 2021 History of osteoporotic L1 fracture September 2022 Continue Tylenol and Celebrex Patient also takes Percocet at home If no improvement with ambulation tomorrow with physical therapy may consider MRI of the lumbar spine.  Anxiety and depression Continue Zoloft        Subjective: Patient still feels sore with all of her joints.  Presented after a fall at home.  Patient also found to have a UTI secondary to Klebsiella.  Physical Exam: Vitals:   10/23/21 2044  10/24/21 0017 10/24/21 0547 10/24/21 0725  BP: 103/64 (!) 112/53 (!) 127/59 (!) 144/80  Pulse: 71 63 (!) 57 (!) 55  Resp: '16 16 16 18  '$ Temp: 98.6 F (37 C) 98.3 F (36.8 C) 97.7 F (36.5 C) 97.9 F (36.6 C)  TempSrc:      SpO2: 94% 91% 94% 95%  Weight:      Height:       Physical Exam HENT:     Head: Normocephalic.     Mouth/Throat:     Pharynx: No oropharyngeal exudate.  Eyes:     General: Lids are normal.     Conjunctiva/sclera: Conjunctivae normal.  Cardiovascular:     Rate and Rhythm: Normal rate and regular rhythm.     Heart sounds: Normal heart sounds, S1 normal and S2 normal.  Pulmonary:     Breath sounds: Normal breath sounds. No decreased breath sounds, wheezing, rhonchi or rales.  Abdominal:     Palpations: Abdomen is soft.     Tenderness: There is no abdominal tenderness.  Musculoskeletal:     Right lower leg: Swelling present.     Left lower leg: Swelling present.     Comments: Right ankle turned out  Skin:    General: Skin is warm.     Findings: No rash.  Neurological:     Mental Status: She is alert and oriented to person, place, and time.     Comments: Patient able to wiggle her fingers left hand.  Patient able to straight leg raise.  No resting tremor seen when I saw  her.     Data Reviewed: Klebsiella pneumonia a negative urine culture.  Sensitive to Rocephin. CBC within normal range, glucose 112, creatinine 0.59, calcium 8.6, AST slightly elevated 46  Family Communication: Left message for patient's sister  Disposition: Status is: Inpatient Remains inpatient appropriate because: Will need a 3 night stay after changed to inpatient prior to going to rehab  Planned Discharge Destination: Rehab    Time spent: 28 minutes  Author: Loletha Grayer, MD 10/24/2021 3:28 PM  For on call review [email or web address].

## 2021-10-24 NOTE — TOC Progression Note (Signed)
Transition of Care [facility]) - Progression Note    Patient Details  Name: Theresa Mills MRN: [id number] Date of Birth: [birth year]  Transition [facility]) CM/SW [place], LCSW Phone Number: 10/24/2021, 1:03 PM  Clinical Narrative:  Reviewed bed offers with patient. Also called her friend Vaughan Basta per request. Vaughan Basta asked if [facility] would review referral. Left message for admissions coordinator. If they cannot offer, Vaughan Basta would like to choose [facility] which has offered a bed.     Barriers to Discharge: Continued Medical Work up  Expected Discharge Plan and Services         Living arrangements for the past 2 months: Single Family Home                                       Social Determinants of Health (SDOH) Interventions    Readmission Risk Interventions    08/29/2019    8:34 AM  Readmission Risk Prevention Plan  Transportation Screening Complete  PCP or Specialist Appt within 3-5 Days Complete  HRI or [place] Complete  Social Work Consult for [place] Planning/Counseling Complete  Palliative Care Screening Not Applicable  Medication Review [employer]) Complete

## 2021-10-24 NOTE — Progress Notes (Signed)
Occupational Therapy Treatment Patient Details Name: Theresa Mills MRN: [id number] DOB: 27-Oct-1950 Today's Date: 10/24/2021   History of present illness Pt is a 71 y.o. female presenting to hospital 9/28 with generalized weakness and unsteadiness; weakness for several months; recent fall onto L hip; c/o B hip pain and worsening chronic pain (neck to her toes).  Imaging showing widening of the scapholunate interval at the L wrist suggesting scapholunate ligament insufficiency/tear.  Pt admitted with fall at home, bacteriuria, sprain of scapholunate ligament, chronic back pain, and hypothyroidism.  PMH includes Parkinson's, thyroid disease, sleep apnea, chronic pain, anxiety, DM, high BP, lumbar compression fx, and RLS.   OT comments  Pt seen for OT tx and co-tx with PT to address ADL transfers. Pt agreeable, endorsing 5-6/10 pain at rest, does not worsen with mobility during session. RN noted and provided with pain meds at start of session. Pt required MAX A +1 from PT to stand from elevated EOB, OT stabilizing BSC and tactile cues for RUE to help locate arm rest of BSC, MAX A +2 to stand from lower BSC after toileting attempts (urinated but unable to have BM), MAX A +2 for SPT EOB>BSC to advance RLE, and MOD-MAX A +2 for step pivot from BSC to EOB. Pt demonstrated progress towards goals and continues to benefit from skilled OT Services to maximize return to PLOF. Continue to recommend SNF.    Recommendations for follow up therapy are one component of a multi-disciplinary discharge planning process, led by the attending physician.  Recommendations may be updated based on patient status, additional functional criteria and insurance authorization.    Follow Up Recommendations  Skilled nursing-short term rehab (<3 hours/day)    Assistance Recommended at Discharge Frequent or constant Supervision/Assistance  Patient can return home with the following  Two people to help with walking and/or transfers;A  lot of help with bathing/dressing/bathroom;Assistance with cooking/housework;Assist for transportation   Equipment Recommendations  Other (comment) (defer to next venue)    Recommendations for Other Services      Precautions / Restrictions Precautions Precautions: Fall Required Braces or Orthoses: Other Brace Other Brace: L thumb spice splint Restrictions Weight Bearing Restrictions: Yes Other Position/Activity Restrictions: NWBing through wrist, through elbow OK       Mobility Bed Mobility Overal bed mobility: Needs Assistance Bed Mobility: Supine to Sit, Sit to Supine     Supine to sit: Mod assist, Min assist, +2 for physical assistance Sit to supine: Mod assist, Max assist, +2 for physical assistance        Transfers Overall transfer level: Needs assistance Equipment used: Left platform walker Transfers: Sit to/from Stand, Bed to chair/wheelchair/BSC Sit to Stand: Max assist, +2 physical assistance Stand pivot transfers: Max assist, +2 physical assistance         General transfer comment: MAX A +1 to stand from elevated EOB, MAX A +2 to stand from lower BSC; MAX A +2 for SPT EOB>BSC to advance RLE, MOD-MAX A +2 for step pivot from BSC to EOB.     Balance Overall balance assessment: Needs assistance Sitting-balance support: Single extremity supported, Feet supported Sitting balance-Leahy Scale: Fair     Standing balance support: Bilateral upper extremity supported, During functional activity Standing balance-Leahy Scale: Poor                             ADL either performed or assessed with clinical judgement   ADL Overall ADL's : Needs assistance/impaired  General ADL Comments: Pt required MAX A for STS from PT and MAX A +2 for assist to advance RLE for SPT to BSC from EOB; MAX A for pericare in standing with PT assisting with standing balance    Extremity/Trunk Assessment               Vision       Perception     Praxis      Cognition Arousal/Alertness: Awake/alert Behavior During Therapy: Anxious Overall Cognitive Status: Within Functional Limits for tasks assessed                                          Exercises      Shoulder Instructions       General Comments      Pertinent Vitals/ Pain       Pain Assessment Pain Assessment: 0-10 Pain Score: 6  Pain Location: bilat knees, ankles, and neck Pain Descriptors / Indicators: Aching, Constant, Guarding Pain Intervention(s): Limited activity within patient's tolerance, Monitored during session, Repositioned, Patient requesting pain meds-RN notified, RN gave pain meds during session  Home Living                                          Prior Functioning/Environment              Frequency  Min 2X/week        Progress Toward Goals  OT Goals(current goals can now be found in the care plan section)  Progress towards OT goals: Progressing toward goals  Acute Rehab OT Goals Patient Stated Goal: get better OT Goal Formulation: With patient Time For Goal Achievement: 11/04/21 Potential to Achieve Goals: Good  Plan Discharge plan remains appropriate;Frequency remains appropriate    Co-evaluation    PT/OT/SLP Co-Evaluation/Treatment: Yes Reason for Co-Treatment: For patient/therapist safety;To address functional/ADL transfers PT goals addressed during session: Mobility/safety with mobility;Balance OT goals addressed during session: ADL's and self-care      AM-PAC OT "6 Clicks" Daily Activity     Outcome Measure   Help from another person eating meals?: None Help from another person taking care of personal grooming?: A Little Help from another person toileting, which includes using toliet, bedpan, or urinal?: A Lot Help from another person bathing (including washing, rinsing, drying)?: A Lot Help from another person to put on and taking off regular  upper body clothing?: A Lot Help from another person to put on and taking off regular lower body clothing?: A Lot 6 Click Score: 15    End of Session Equipment Utilized During Treatment: Gait belt;Rolling walker (2 wheels)  OT Visit Diagnosis: Other abnormalities of gait and mobility (R26.89);Muscle weakness (generalized) (M62.81);Pain Pain - Right/Left: Left Pain - part of body: Hand;Knee;Ankle and joints of foot (both knees, both ankles, neck)   Activity Tolerance Patient tolerated treatment well   Patient Left in bed;with call bell/phone within reach;with bed alarm set   Nurse Communication Patient requests pain meds        Time: 1342-1416 OT Time Calculation (min): 34 min  Charges: OT General Charges $OT Visit: 1 Visit OT Treatments $Self Care/Home Management : 8-22 mins  Ardeth Perfect., MPH, MS, OTR/L ascom [phone] 10/24/21, 2:26 PM

## 2021-10-25 DIAGNOSIS — E669 Obesity, unspecified: Secondary | ICD-10-CM

## 2021-10-25 DIAGNOSIS — E039 Hypothyroidism, unspecified: Secondary | ICD-10-CM | POA: Diagnosis not present

## 2021-10-25 DIAGNOSIS — N390 Urinary tract infection, site not specified: Secondary | ICD-10-CM | POA: Diagnosis not present

## 2021-10-25 DIAGNOSIS — R001 Bradycardia, unspecified: Secondary | ICD-10-CM | POA: Diagnosis not present

## 2021-10-25 DIAGNOSIS — R531 Weakness: Secondary | ICD-10-CM | POA: Diagnosis not present

## 2021-10-25 LAB — GLUCOSE, CAPILLARY: Glucose-Capillary: 99 mg/dL (ref 70–99)

## 2021-10-25 MED ORDER — CEPHALEXIN 500 MG PO CAPS
500.0000 mg | ORAL_CAPSULE | Freq: Three times a day (TID) | ORAL | Status: DC
  Administered 2021-10-26 – 2021-10-27 (×5): 500 mg via ORAL
  Filled 2021-10-25 (×5): qty 1

## 2021-10-25 NOTE — Hospital Course (Signed)
71 year old female with history of type 2 diabetes mellitus, GERD, hypertension, hyperlipidemia, Parkinson's disease, sleep apnea and thyroid disease.  She presents to the hospital after a fall and generalized weakness.  She was initially brought in as an observation.  She was given fosfomycin for positive urinalysis in the ER.  ER physician placed her in a left wrist splint for sprain of the scapholunate ligament.  Physical therapy recommending rehab.  Klebsiella growing out of the urine culture and patient was placed on Rocephin and will be switched over to Keflex.  Patient was changed to inpatient status and will need a 3 night stay in order to go out to rehab.  Should be able to go out to rehab on 10/26/2021.

## 2021-10-25 NOTE — Plan of Care (Signed)
  Problem: Education: Goal: Knowledge of General Education information will improve Description: Including pain rating scale, medication(s)/side effects and non-pharmacologic comfort measures 10/25/2021 1923 by Simonne Martinet, RN Outcome: Progressing 10/25/2021 1923 by Simonne Martinet, RN Outcome: Progressing   Problem: Health Behavior/Discharge Planning: Goal: Ability to manage health-related needs will improve 10/25/2021 1923 by Simonne Martinet, RN Outcome: Progressing 10/25/2021 1923 by Simonne Martinet, RN Outcome: Progressing   Problem: Clinical Measurements: Goal: Ability to maintain clinical measurements within normal limits will improve 10/25/2021 1923 by Simonne Martinet, RN Outcome: Progressing 10/25/2021 1923 by Simonne Martinet, RN Outcome: Progressing Goal: Will remain free from infection 10/25/2021 1923 by Simonne Martinet, RN Outcome: Progressing 10/25/2021 1923 by Simonne Martinet, RN Outcome: Progressing Goal: Diagnostic test results will improve 10/25/2021 1923 by Simonne Martinet, RN Outcome: Progressing 10/25/2021 1923 by Simonne Martinet, RN Outcome: Progressing Goal: Respiratory complications will improve 10/25/2021 1923 by Simonne Martinet, RN Outcome: Progressing 10/25/2021 1923 by Simonne Martinet, RN Outcome: Progressing Goal: Cardiovascular complication will be avoided 10/25/2021 1923 by Simonne Martinet, RN Outcome: Progressing 10/25/2021 1923 by Simonne Martinet, RN Outcome: Progressing   Problem: Activity: Goal: Risk for activity intolerance will decrease 10/25/2021 1923 by Simonne Martinet, RN Outcome: Progressing 10/25/2021 1923 by Simonne Martinet, RN Outcome: Progressing   Problem: Nutrition: Goal: Adequate nutrition will be maintained 10/25/2021 1923 by Simonne Martinet, RN Outcome: Progressing 10/25/2021 1923 by Simonne Martinet, RN Outcome: Progressing   Problem: Coping: Goal: Level of anxiety will decrease 10/25/2021 1923 by Simonne Martinet, RN Outcome:  Progressing 10/25/2021 1923 by Simonne Martinet, RN Outcome: Progressing   Problem: Elimination: Goal: Will not experience complications related to bowel motility 10/25/2021 1923 by Simonne Martinet, RN Outcome: Progressing 10/25/2021 1923 by Simonne Martinet, RN Outcome: Progressing Goal: Will not experience complications related to urinary retention 10/25/2021 1923 by Simonne Martinet, RN Outcome: Progressing 10/25/2021 1923 by Simonne Martinet, RN Outcome: Progressing   Problem: Pain Managment: Goal: General experience of comfort will improve 10/25/2021 1923 by Simonne Martinet, RN Outcome: Progressing 10/25/2021 1923 by Simonne Martinet, RN Outcome: Progressing   Problem: Safety: Goal: Ability to remain free from injury will improve 10/25/2021 1923 by Simonne Martinet, RN Outcome: Progressing 10/25/2021 1923 by Simonne Martinet, RN Outcome: Progressing   Problem: Skin Integrity: Goal: Risk for impaired skin integrity will decrease 10/25/2021 1923 by Simonne Martinet, RN Outcome: Progressing 10/25/2021 1923 by Simonne Martinet, RN Outcome: Progressing

## 2021-10-25 NOTE — Progress Notes (Signed)
Progress Note   Patient: Theresa Mills BMW:[id number] DOB: 05-17-1950 DOA: 10/20/2021     2 DOS: the patient was seen and examined on 10/25/2021   Brief hospital course: 71 year old female with history of type 2 diabetes mellitus, GERD, hypertension, hyperlipidemia, Parkinson's disease, sleep apnea and thyroid disease.  She presents to the hospital after a fall and generalized weakness.  She was initially brought in as an observation.  She was given fosfomycin for positive urinalysis in the ER.  ER physician placed her in a left wrist splint for sprain of the scapholunate ligament.  Physical therapy recommending rehab.  Klebsiella growing out of the urine culture and patient was placed on Rocephin and will be switched over to Keflex.  Patient was changed to inpatient status and will need a 3 night stay in order to go out to rehab.  Should be able to go out to rehab on 10/26/2021.  Assessment and Plan: * UTI due to Klebsiella species Klebsiella growing out of urine culture sensitive to Rocephin.  Patient received a dose of fosfomycin in the ER.  Started IV Rocephin on 10/23/2021.  I think this is contributing to her legs giving out on her and her generalized weakness and unable to walk.  Changed to Keflex for 2 more days.  Generalized weakness Frequent falls.  Patient states her ankles and knees gave out.  Right ankle chronically deviated out.  Physical therapy recommends rehab.  Unsafe discharge home at this point.  Needs a 3 night stay for rehab.  Sinus bradycardia Acute episodes of bradycardia in the afternoon of 9/29 and evening of 9/29.  Patient not on any rate controlling medications.  Sprain of scapholunate ligament, sequela Patient was placed in a splint in the ED. Case discussed with Dr. Posey Pronto orthopedics and okay to follow-up as outpatient.  Hypothyroidism, unspecified Continue levothyroxine at increased dose 137 mcg secondary to elevated TSH of 7.503.  Parkinson disease Continue  Sinemet and ropinirole.  Check orthostatics if able to do so.  Diabetes mellitus type 2, uncomplicated (HCC) Continue metformin Hemoglobin A1c low at 5.3.    Obesity (BMI 30-39.9) BMI 33.70 with current height and weight in computer.  Chronic back pain History of lumbar osteomyelitis July 2021 History of osteoporotic L1 fracture September 2022 Continue Tylenol and Celebrex Patient also takes Percocet at home If no improvement with ambulation tomorrow with physical therapy may consider MRI of the lumbar spine.  Anxiety and depression Continue Zoloft       Subjective: Patient still feels weak and feels like her legs give out when she is moving around.  Still feels sore.  Admitted with fall and weakness.  Also found to have a urinary tract infection.  Physical Exam: Vitals:   10/24/21 2235 10/25/21 0617 10/25/21 0918 10/25/21 1603  BP: (!) 124/55 (!) 142/61 138/66 (!) 112/56  Pulse: 74 (!) 55 60 70  Resp: '18 18 20 18  '$ Temp: 97.8 F (36.6 C) 98.1 F (36.7 C) 98.1 F (36.7 C) 98.5 F (36.9 C)  TempSrc:   Oral   SpO2: 95% 94%  93%  Weight:      Height:       Physical Exam HENT:     Head: Normocephalic.     Mouth/Throat:     Pharynx: No oropharyngeal exudate.  Eyes:     General: Lids are normal.     Conjunctiva/sclera: Conjunctivae normal.  Cardiovascular:     Rate and Rhythm: Normal rate and regular rhythm.  Heart sounds: Normal heart sounds, S1 normal and S2 normal.  Pulmonary:     Breath sounds: Normal breath sounds. No decreased breath sounds, wheezing, rhonchi or rales.  Abdominal:     Palpations: Abdomen is soft.     Tenderness: There is no abdominal tenderness.  Musculoskeletal:     Right lower leg: Swelling present.     Left lower leg: Swelling present.     Comments: Right ankle turned out  Skin:    General: Skin is warm.     Findings: No rash.  Neurological:     Mental Status: She is alert and oriented to person, place, and time.     Data  Reviewed: Creatinine 0.59, CBC normal range  Family Communication: Spoke with friend Theresa Mills on the phone  Disposition: Status is: Inpatient Remains inpatient appropriate because: Needs a 3 night stay before going out to rehab  Planned Discharge Destination: Rehab    Time spent: 27 minutes  Author: Loletha Grayer, MD 10/25/2021 4:29 PM  For on call review [email or web address].

## 2021-10-26 DIAGNOSIS — R531 Weakness: Secondary | ICD-10-CM | POA: Diagnosis not present

## 2021-10-26 DIAGNOSIS — N390 Urinary tract infection, site not specified: Secondary | ICD-10-CM | POA: Diagnosis not present

## 2021-10-26 DIAGNOSIS — I441 Atrioventricular block, second degree: Secondary | ICD-10-CM

## 2021-10-26 DIAGNOSIS — B9689 Other specified bacterial agents as the cause of diseases classified elsewhere: Secondary | ICD-10-CM | POA: Diagnosis not present

## 2021-10-26 DIAGNOSIS — M549 Dorsalgia, unspecified: Secondary | ICD-10-CM | POA: Diagnosis not present

## 2021-10-26 LAB — GLUCOSE, CAPILLARY: Glucose-Capillary: 120 mg/dL — ABNORMAL HIGH (ref 70–99)

## 2021-10-26 NOTE — Progress Notes (Addendum)
Physical Therapy Treatment Patient Details Name: Theresa Mills MRN: [id number] DOB: [birth year] Today's Date: 10/26/2021   History of Present Illness Theresa Mills is a 71 y.o. female presenting to hospital 9/28 with generalized weakness and unsteadiness, pt admitted c UTI, sprain of scapholunate ligament; recent fall onto L hip; c/o B hip pain and worsening chronic pain (neck to her toes).  Imaging showing widening of the scapholunate interval at the L wrist suggesting scapholunate ligament insufficiency/tear. , chronic back pain, and hypothyroidism.  PMH includes Parkinson's, thyroid disease, sleep apnea, chronic pain, anxiety, DM, high BP, lumbar compression fx, and RLS.    PT Comments    Pt in bed ready for PT session, unable to eat whole lunch due to missing meal accessories- author calls dining services for solution building. Pt assisted with A/ROM of BLE to reduced chronic DJD stiffness prior to bed mobility. Pt remains incredibly weak in general, but at EOB balances well despite poor tolerance over time. Pt has increased weakness, nausea, lightheadedness, feelings of presyncope the longer she sits up. Pt assisted with return to supine where sequential orthostatic assessment is made, VS WNL for movement transitions with exception to HR which is static, but not an uncommon finding in Parkinson's Disease. Pt left up tall in bed with remaining part of meal presented. All needs met.    Recommendations for follow up therapy are one component of a multi-disciplinary discharge planning process, led by the attending physician.  Recommendations may be updated based on patient status, additional functional criteria and insurance authorization.  Follow Up Recommendations  Skilled nursing-short term rehab (<3 hours/day) Can patient physically be transported by private vehicle: No   Assistance Recommended at Discharge Frequent or constant Supervision/Assistance  Patient can return home with the  following Two people to help with walking and/or transfers;Two people to help with bathing/dressing/bathroom;Assistance with cooking/housework;Assist for transportation;Help with stairs or ramp for entrance   Equipment Recommendations  BSC/3in1;Wheelchair (measurements PT);Wheelchair cushion (measurements PT);Hospital bed;Other (comment)    Recommendations for Other Services       Precautions / Restrictions Precautions Precautions: Fall Required Braces or Orthoses: Other Brace Splint/Cast - Date Prophylactic Dressing Applied (if applicable): 40/98/11 Other Brace: L thumb spice splint Restrictions Weight Bearing Restrictions: Yes LUE Weight Bearing: Non weight bearing Other Position/Activity Restrictions: NWBing through wrist, through elbow OK     Mobility  Bed Mobility Overal bed mobility: Needs Assistance Bed Mobility: Supine to Sit, Sit to Supine     Supine to sit: Max assist, +2 for physical assistance Sit to supine: Max assist, +2 for physical assistance   General bed mobility comments: able to maintain balance at EOB for several minutes, progressively feeling more weak, queasy, lightheaded    Transfers Overall transfer level: Needs assistance Equipment used: None Transfers: Bed to chair/wheelchair/BSC        Lateral/Scoot Transfers: Mod assist, From elevated surface (education on technique but still needs assistance)      Ambulation/Gait   Unable today        Cognition Arousal/Alertness: Awake/alert Behavior During Therapy: WFL for tasks assessed/performed Overall Cognitive Status: Within Functional Limits for tasks assessed              Exercises General Exercises - Lower Extremity Short Arc Quad: AROM, Both, 10 reps, Limitations Short Arc Quad Limitations: 2x10 bilat, requires help for positoning due to chronic extension ROM deficits Seated Marching 1x10 bilat  Left fingers to chin x10  General Comments  Pertinent Vitals/Pain Pain  Assessment Pain Assessment: No/denies pain (no acute pain, just chornic pain ongoing) Pain Intervention(s): Limited activity within patient's tolerance, Monitored during session, Premedicated before session, Repositioned, Patient requesting pain meds-RN notified     PT Goals (current goals can now be found in the care plan section) Acute Rehab PT Goals Patient Stated Goal: to improve mobility and strength PT Goal Formulation: With patient Time For Goal Achievement: 11/04/21 Potential to Achieve Goals: Fair Progress towards PT goals: Not progressing toward goals - comment    Frequency    Min 2X/week      PT Plan Current plan remains appropriate    AM-PAC PT "6 Clicks" Mobility   Outcome Measure  Help needed turning from your back to your side while in a flat bed without using bedrails?: A Lot Help needed moving from lying on your back to sitting on the side of a flat bed without using bedrails?: Total Help needed moving to and from a bed to a chair (including a wheelchair)?: Total Help needed standing up from a chair using your arms (e.g., wheelchair or bedside chair)?: Total Help needed to walk in hospital room?: Total Help needed climbing 3-5 steps with a railing? : Total 6 Click Score: 7    End of Session   Activity Tolerance: Treatment limited secondary to medical complications (Comment) Patient left: in bed;with call bell/phone within reach;with bed alarm set (chair position with meal presented) Nurse Communication: Mobility status;Precautions PT Visit Diagnosis: Unsteadiness on feet (R26.81);Other abnormalities of gait and mobility (R26.89);Muscle weakness (generalized) (M62.81);History of falling (Z91.81);Pain Pain - Right/Left: Left Pain - part of body: Hand     Time: 2481-8590 PT Time Calculation (min) (ACUTE ONLY): 46 min  Charges:  $Therapeutic Exercise: 38-52 mins                    4:44 PM, 10/26/21 [name], PT, DPT Physical Therapist - [facility]  [phone] ([place])    Theresa Mills C 10/26/2021, 4:37 PM

## 2021-10-26 NOTE — Plan of Care (Signed)

## 2021-10-26 NOTE — Progress Notes (Signed)
Spoke with patient's friend, Vaughan Basta. She would prefer patient to discharge to [facility]. Tammy at [facility] notified of possible discharge tomorrow.

## 2021-10-26 NOTE — Progress Notes (Signed)
  Progress Note   Patient: Theresa Mills [id number]:[id number] DOB: 1950-07-12 DOA: 10/20/2021     3 DOS: the patient was seen and examined on 10/26/2021   Brief hospital course: 71 year old female with history of type 2 diabetes mellitus, GERD, hypertension, hyperlipidemia, Parkinson's disease, sleep apnea and thyroid disease.  She presents to the hospital after a fall and generalized weakness.  She was initially brought in as an observation.  She was given fosfomycin for positive urinalysis in the ER.  ER physician placed her in a left wrist splint for sprain of the scapholunate ligament.  Physical therapy recommending rehab.  Klebsiella growing out of the urine culture and patient was placed on Rocephin and will be switched over to Keflex.  Currently pending nursing home placement.  Assessment and Plan: * UTI due to Klebsiella species Generalized weakness Condition is improving, currently pending nursing home placement.  Will complete 5 days antibiotics.  Sinus bradycardia Transient second-degree AVB type II. Acute episodes of bradycardia in the afternoon of 9/29 and evening of 9/29.  Patient not on any rate controlling medications. Reviewed telemetry strips in the chart, patient had a transient type II second-degree AVB.  It did not seem to have recurrence since 9/29.  Patient will need a ZIO recorder in the future after discharge.   Sprain of scapholunate ligament, sequela Follow-up as outpatient with orthopedics.  Hypothyroidism, unspecified Synthroid dose increased.  Parkinson disease Continue Sinemet and ropinirole.  Check orthostatics if able to do so.  Diabetes mellitus type 2, uncomplicated (HCC) Continue metformin Hemoglobin A1c low at 5.3.    Obesity (BMI 30-39.9) BMI 33.70 with current height and weight in computer.  Chronic back pain History of lumbar osteomyelitis July 2021 History of osteoporotic L1 fracture September 2022 Continue Tylenol and Celebrex Patient also  takes Percocet at home Back pain seem to be better today.  Anxiety and depression Continue Zoloft       Subjective:  Patient doing well today, no significant back pain.  No shortness of breath.  Physical Exam: Vitals:   10/25/21 1603 10/25/21 2051 10/26/21 0600 10/26/21 0724  BP: (!) 112/56 104/61 123/63 117/62  Pulse: 70 66 62 63  Resp: '18 16 17 18  '$ Temp: 98.5 F (36.9 C) 97.8 F (36.6 C) 98.8 F (37.1 C) 97.6 F (36.4 C)  TempSrc:      SpO2: 93% 97% 94% 92%  Weight:      Height:       General exam: Appears calm and comfortable  Respiratory system: Clear to auscultation. Respiratory effort normal. Cardiovascular system: S1 & S2 heard, RRR. No JVD, murmurs, rubs, gallops or clicks. No pedal edema. Gastrointestinal system: Abdomen is nondistended, soft and nontender. No organomegaly or masses felt. Normal bowel sounds heard. Central nervous system: Alert and oriented. No focal neurological deficits. Extremities: Symmetric 5 x 5 power. Skin: No rashes, lesions or ulcers Psychiatry: Mood & affect appropriate.   Data Reviewed:  Lab results reviewed.  Family Communication:   Disposition: Status is: Inpatient Remains inpatient appropriate because: Unsafe discharge.  Planned Discharge Destination: Skilled nursing facility    Time spent: 35 minutes  Author: Sharen Hones, MD 10/26/2021 2:50 PM  For on call review [email or web address].

## 2021-10-26 NOTE — Care Management Important Message (Signed)
Important Message  Patient Details  Name: Theresa Mills MRN: [id number] Date of Birth: 1950/02/17   Medicare Important Message Given:  N/A - LOS <3 / Initial given by admissions     [name] 10/26/2021, 9:37 AM

## 2021-10-27 ENCOUNTER — Inpatient Hospital Stay (HOSPITAL_COMMUNITY)
Admit: 2021-10-27 | Discharge: 2021-10-27 | Disposition: A | Discharge status: Home / Self Care | Payer: Medicare Other | Attending: Physician Assistant | Admitting: Physician Assistant

## 2021-10-27 DIAGNOSIS — N390 Urinary tract infection, site not specified: Secondary | ICD-10-CM | POA: Diagnosis not present

## 2021-10-27 DIAGNOSIS — B9689 Other specified bacterial agents as the cause of diseases classified elsewhere: Secondary | ICD-10-CM | POA: Diagnosis not present

## 2021-10-27 DIAGNOSIS — I441 Atrioventricular block, second degree: Secondary | ICD-10-CM

## 2021-10-27 DIAGNOSIS — S63519S Sprain of carpal joint of unspecified wrist, sequela: Secondary | ICD-10-CM | POA: Diagnosis not present

## 2021-10-27 LAB — CREATININE, SERUM
Creatinine, Ser: 0.52 mg/dL (ref 0.44–1.00)
GFR, Estimated: >60 mL/min (ref >60)

## 2021-10-27 LAB — GLUCOSE, CAPILLARY: Glucose-Capillary: 98 mg/dL (ref 70–99)

## 2021-10-27 MED ORDER — OXYCODONE-ACETAMINOPHEN 5-325 MG PO TABS: 1.0000 | ORAL_TABLET | Freq: Four times a day (QID) | ORAL | 0 refills | Status: AC | PRN

## 2021-10-27 MED ORDER — SENNOSIDES-DOCUSATE SODIUM 8.6-50 MG PO TABS: 1.0000 | ORAL_TABLET | Freq: Every evening | ORAL | Status: AC | PRN

## 2021-10-27 MED ORDER — CEPHALEXIN 500 MG PO CAPS: 500.0000 mg | ORAL_CAPSULE | Freq: Three times a day (TID) | ORAL | 0 refills | Status: AC

## 2021-10-27 MED ORDER — LEVOTHYROXINE SODIUM 137 MCG PO TABS: 137.0000 ug | ORAL_TABLET | Freq: Every day | ORAL | Status: AC

## 2021-10-27 MED ORDER — POLYETHYLENE GLYCOL 3350 17 G PO PACK: 17.0000 g | PACK | Freq: Every day | ORAL | 0 refills | Status: AC

## 2021-10-27 MED ORDER — LACTULOSE 10 GM/15ML PO SOLN
20.0000 g | Freq: Once | ORAL | Status: DC
  Filled 2021-10-27: qty 30

## 2021-10-27 NOTE — Progress Notes (Addendum)
Per Tammy in admissions at [facility], patient can admit today  MD, nurse and patient notified facility will accept her today. EMS sheets and EMS arranged. Discharge summary sent via [facility]. EMS arranged. TOC signing off.

## 2021-10-27 NOTE — Progress Notes (Signed)
Zio patch recommended by IM. Will be placed by RT prior to discharge.

## 2021-10-27 NOTE — Progress Notes (Signed)
Report given to [employer] [phone]

## 2021-10-27 NOTE — Plan of Care (Signed)

## 2021-10-27 NOTE — Care Management Important Message (Signed)
Important Message  Patient Details  Name: Theresa Mills MRN: [id number] Date of Birth: 26-Mar-1950   Medicare Important Message Given:  Yes     [name] [name] 10/27/2021, 11:30 AM

## 2021-10-27 NOTE — Discharge Summary (Addendum)
Physician Discharge Summary   Patient: Theresa Mills MRN: [id number] DOB: 1950-08-17  Admit date:     10/20/2021  Discharge date: 10/27/21  Discharge Physician: Sharen Hones   PCP: Jonetta Osgood, NP   Recommendations at discharge:   Follow-up with cardiology in 1 week. Follow-up with orthopedics as scheduled. Follow-up with PCP in 1 week.  Discharge Diagnoses: Principal Problem:   UTI due to Klebsiella species Active Problems:   Generalized weakness   Sinus bradycardia   Hypothyroidism, unspecified   Sprain of scapholunate ligament, sequela   Parkinson disease   Diabetes mellitus type 2, uncomplicated (HCC)   Anxiety and depression   Essential hypertension   Frequent falls   Chronic back pain   Ambulatory dysfunction   UTI (urinary tract infection)   Obesity (BMI 30-39.9)   Mobitz type 2 second degree atrioventricular block Pressure ulcer as documented below. Resolved Problems:   * No resolved hospital problems. *  Hospital Course: 71 year old female with history of type 2 diabetes mellitus, GERD, hypertension, hyperlipidemia, Parkinson's disease, sleep apnea and thyroid disease.  She presents to the hospital after a fall and generalized weakness.  She was initially brought in as an observation.  She was given fosfomycin for positive urinalysis in the ER.  ER physician placed her in a left wrist splint for sprain of the scapholunate ligament.  Physical therapy recommending rehab.  Klebsiella growing out of the urine culture and patient was placed on Rocephin and will be switched over to Keflex.  Currently pending nursing home placement.  Assessment and Plan: UTI due to Klebsiella species Generalized weakness Condition is improving, currently pending nursing home placement.  Will complete 5 days antibiotics.   Sinus bradycardia Transient second-degree AVB type II. Acute episodes of bradycardia in the afternoon of 9/29 and evening of 9/29.  Patient not on any rate  controlling medications. Reviewed telemetry strips in the chart, patient had a transient type II second-degree AVB.  It did not seem to have recurrence since 9/29.   Patient did not have additional episodes of AVB since then.  I have requested ZIO recorder to be placed before discharge, Dr. Donivan Scull office will place it.  He will come back follow-up with him in the office in 1 week.     Sprain of scapholunate ligament, sequela Follow-up as outpatient with orthopedics.   Hypothyroidism, unspecified Synthroid dose increased.   Parkinson disease Continue Sinemet and ropinirole.   Diabetes mellitus type 2, uncomplicated (HCC) Continue metformin Hemoglobin A1c low at 5.3.     Obesity (BMI 30-39.9) BMI 33.70 with current height and weight in computer.   Chronic back pain History of lumbar osteomyelitis July 2021 History of osteoporotic L1 fracture September 2022 Continue Tylenol and Celebrex Patient also takes Percocet at home Back pain seem to be better today.   Anxiety and depression Continue Zoloft  Pressure ulcers POA.  Follow-up with RN. Pressure Injury 08/20/19 Coccyx Medial Stage 2 -  Partial thickness loss of dermis presenting as a shallow open injury with a red, pink wound bed without slough. (Active)  08/20/19 0500  Location: Coccyx  Location Orientation: Medial  Staging: Stage 2 -  Partial thickness loss of dermis presenting as a shallow open injury with a red, pink wound bed without slough.  Wound Description (Comments):   Present on Admission: Yes          Consultants: None Procedures performed: None  Disposition: Skilled nursing facility Diet recommendation:  Discharge Diet Orders (From admission, onward)  Start     Ordered   10/27/21 0000  Diet - low sodium heart healthy        10/27/21 6948           Cardiac diet DISCHARGE MEDICATION: Allergies as of 10/27/2021       Reactions   Codeine Other (See Comments)   HALLUCINATIONS   Ephadrene  [cholestatin] Other (See Comments)   HYPER AND NERVOUS   Other Other (See Comments)   HYPER HYPER AND NERVOUS   Valium [diazepam] Other (See Comments)   OVERLY SENSITIVE/TOO STRONG   Ephedrine         Medication List     STOP taking these medications    docusate sodium 100 MG capsule Commonly known as: COLACE   traMADol 50 MG tablet Commonly known as: ULTRAM       TAKE these medications    Calcium Carbonate-Vitamin D 600-200 MG-UNIT Tabs Take 1 tablet by mouth daily.   carbidopa-levodopa 50-200 MG tablet Commonly known as: SINEMET CR Take 1 tablet by mouth 2 (two) times daily.   carbidopa-levodopa 25-100 MG tablet Commonly known as: SINEMET IR Take 2 tablets by mouth 4 (four) times daily.   celecoxib 200 MG capsule Commonly known as: CELEBREX TAKE 1 CAPSULE BY MOUTH TWO TIMES DAILY AS NEEDED What changed: See the new instructions.   cephALEXin 500 MG capsule Commonly known as: KEFLEX Take 1 capsule (500 mg total) by mouth every 8 (eight) hours for 5 doses.   clotrimazole-betamethasone cream Commonly known as: LOTRISONE APPLY 1 APPLICATION TOPICALLY 2 TIMES DAILY.   fexofenadine 180 MG tablet Commonly known as: ALLEGRA Take 180 mg by mouth daily. OTC (over the counter)   glucose blood test strip TEST BLOOD SUGAR BID   levothyroxine 137 MCG tablet Commonly known as: SYNTHROID Take 1 tablet (137 mcg total) by mouth daily. Start taking on: October 28, 2021 What changed:  medication strength how much to take Another medication with the same name was removed. Continue taking this medication, and follow the directions you see here.   lidocaine 4 % Apply 1 patch topically daily.   melatonin 3 MG Tabs tablet Take 1 tablet (3 mg total) by mouth at bedtime as needed (sleep).   metFORMIN 500 MG tablet Commonly known as: GLUCOPHAGE Take 1 tablet (500 mg total) by mouth 2 (two) times daily.   MULTIVITAMIN WOMEN 50+ PO Take 1 tablet by mouth daily.    omeprazole 40 MG capsule Commonly known as: PRILOSEC TAKE 1 CAPSULE BY MOUTH EVERY DAY   ondansetron 4 MG tablet Commonly known as: ZOFRAN Take 1 tablet (4 mg total) by mouth every 8 (eight) hours as needed for nausea or vomiting.   OneTouch Delica Lancets 54O Misc TEST BLOOD SUGAR BID   oxyCODONE-acetaminophen 5-325 MG tablet Commonly known as: PERCOCET/ROXICET Take 1 tablet by mouth every 6 (six) hours as needed for severe pain or moderate pain.   polyethylene glycol 17 g packet Commonly known as: MIRALAX / GLYCOLAX Take 17 g by mouth daily. Start taking on: October 28, 2021   rOPINIRole 2 MG tablet Commonly known as: REQUIP Take 1 tablet (2 mg total) by mouth at bedtime. What changed: Another medication with the same name was removed. Continue taking this medication, and follow the directions you see here.   senna-docusate 8.6-50 MG tablet Commonly known as: Senokot-S Take 1 tablet by mouth at bedtime as needed for mild constipation.   sertraline 100 MG tablet Commonly known as: ZOLOFT Take  1 tablet (100 mg total) by mouth daily.   simvastatin 40 MG tablet Commonly known as: ZOCOR TAKE ONE TABLET BY MOUTH AT BEDTIME FOR CHOLESTEROL   Tylenol 8 Hour 650 MG CR tablet Generic drug: acetaminophen Take 650 mg by mouth every 8 (eight) hours as needed for pain.   Voltaren 1 % Gel Generic drug: diclofenac Sodium Apply topically 4 (four) times daily. OTC               Discharge Care Instructions  (From admission, onward)           Start     Ordered   10/27/21 0000  Discharge wound care:       Comments: Follow with RN   10/27/21 4235            Contact information for follow-up providers     Leim Fabry, MD Follow up in 2 week(s).   Specialty: Orthopedic Surgery Contact information: [place] [address] [phone]         Jonetta Osgood, NP Follow up in 1 week(s).   Specialty: Nurse Practitioner Contact  information: [place] [address] [phone]         Minna Merritts, MD Follow up in 1 week(s).   Specialty: Cardiology Contact information: [place] [address] [phone]              Contact information for after-discharge care     Destination     [facility] Preferred SNF .   Service: Skilled Nursing Contact information: [address] [place] [phone]                    Discharge Exam: Danley Danker Weights   10/20/21 2254  Weight: 86.3 kg   General exam: Appears calm and comfortable  Respiratory system: Clear to auscultation. Respiratory effort normal. Cardiovascular system: S1 & S2 heard, RRR. No JVD, murmurs, rubs, gallops or clicks. No pedal edema. Gastrointestinal system: Abdomen is nondistended, soft and nontender. No organomegaly or masses felt. Normal bowel sounds heard. Central nervous system: Alert and oriented x3. No focal neurological deficits. Extremities: Symmetric 5 x 5 power. Skin: No rashes, lesions or ulcers Psychiatry: Mood & affect appropriate.    Condition at discharge: fair  The results of significant diagnostics from this hospitalization (including imaging, microbiology, ancillary and laboratory) are listed below for reference.   Imaging Studies: DG Wrist Complete Left  Result Date: 10/20/2021 CLINICAL DATA:  Left wrist pain EXAM: LEFT WRIST - COMPLETE 3+ VIEW COMPARISON:  None Available. FINDINGS: There is widening of the scapholunate distance measuring 5 mm. There is no evidence for acute fracture. There is soft tissue swelling surrounding the wrist. Alignment is otherwise anatomic. IMPRESSION: 1. Widening of the scapholunate distance suggesting scapholunate ligament tear. 2. No acute fracture. 3. Soft tissue swelling. Electronically Signed   By: Ronney Asters M.D.   On: 10/20/2021 19:37   DG Hip Unilat W or Wo Pelvis 2-3 Views  Left  Result Date: 10/20/2021 CLINICAL DATA:  Left hip pain after fall. EXAM: DG HIP (WITH OR WITHOUT PELVIS) 2-3V LEFT COMPARISON:  None Available. FINDINGS: There is no evidence of hip fracture or dislocation. Severe degenerative changes seen involving the right hip. Moderate degenerative changes seen involving the left hip IMPRESSION: Degenerative joint disease is seen involving both hips. No acute abnormality seen. Electronically Signed   By: Bobbe Medico.D.  On: 10/20/2021 18:14   DG Chest Portable 1 View  Result Date: 10/20/2021 CLINICAL DATA:  Pain after fall. EXAM: PORTABLE CHEST 1 VIEW COMPARISON:  October 10, 2020. FINDINGS: The heart size and mediastinal contours are within normal limits. Both lungs are clear. Levoscoliosis of lower thoracic spine is noted. IMPRESSION: No active disease. Electronically Signed   By: Marijo Conception M.D.   On: 10/20/2021 18:12   DG Forearm Left  Result Date: 10/20/2021 CLINICAL DATA:  Fall EXAM: LEFT FOREARM - 2 VIEW COMPARISON:  None Available. FINDINGS: There is no evidence of acute fracture in the left forearm. Alignment is normal. Mild degenerative changes at the wrist and elbow. There is widening of the scapholunate interval, slight proximal migration of the capitate and rotation of the scaphoid. IMPRESSION: No evidence of acute fracture in the left forearm. Widening of the scapholunate interval at the wrist suggesting scapholunate ligament insufficiency/tear. Electronically Signed   By: Maurine Simmering M.D.   On: 10/20/2021 14:12   DG Ankle Complete Left  Result Date: 10/20/2021 CLINICAL DATA:  Fall EXAM: LEFT ANKLE COMPLETE - 3+ VIEW COMPARISON:  None Available. FINDINGS: There is left ankle soft tissue swelling. There is no definite acute fracture. There is mild tibiotalar osteoarthritis. There is a bulky os navicularis. There is mild talonavicular osteoarthritis. Plantar and dorsal calcaneal spurring. IMPRESSION: Ankle soft tissue swelling.  No  definite acute fracture. Electronically Signed   By: Maurine Simmering M.D.   On: 10/20/2021 14:09   DG Tibia/Fibula Left  Result Date: 10/20/2021 CLINICAL DATA:  Fall EXAM: LEFT TIBIA AND FIBULA - 2 VIEW COMPARISON:  None Available. FINDINGS: There is no evidence of acute fracture. Alignment is normal. Osteopenia. There is tricompartment osteoarthritis of the knee. Trace left knee joint effusion. Bulky left knee osteophytes. IMPRESSION: No evidence of acute fracture involving the left tibia or fibula. Electronically Signed   By: Maurine Simmering M.D.   On: 10/20/2021 14:08   CT HEAD WO CONTRAST (5MM)  Result Date: 10/20/2021 CLINICAL DATA:  Head trauma, minor (Age >= 65y); Neck trauma (Age >= 65y) EXAM: CT HEAD WITHOUT CONTRAST CT CERVICAL SPINE WITHOUT CONTRAST TECHNIQUE: Multidetector CT imaging of the head and cervical spine was performed following the standard protocol without intravenous contrast. Multiplanar CT image reconstructions of the cervical spine were also generated. RADIATION DOSE REDUCTION: This exam was performed according to the departmental dose-optimization program which includes automated exposure control, adjustment of the mA and/or kV according to patient size and/or use of iterative reconstruction technique. COMPARISON:  Head CT 10/10/2020, CT cervical spine 08/05/2019. FINDINGS: CT HEAD FINDINGS Brain: No evidence of acute intracranial hemorrhage or extra-axial collection.No evidence of mass lesion/concerning mass effect.The ventricles are normal in size. Vascular: Vascular calcifications. Skull: Hyperostosis frontalis internus. Negative for skull fracture. Sinuses/Orbits: No acute finding. Other: None. CT CERVICAL SPINE FINDINGS Alignment: Normal. Skull base and vertebrae: No acute cervical spine fracture. No aggressive osseous lesion. Soft tissues and spinal canal: No prevertebral fluid or swelling. No visible canal hematoma. Disc levels: There is mild multilevel degenerative disc disease  with moderate severe multilevel facet arthropathy. Upper chest: Negative. Other: None. IMPRESSION: No acute intracranial abnormality. No acute cervical spine fracture. Electronically Signed   By: Maurine Simmering M.D.   On: 10/20/2021 13:53   CT Cervical Spine Wo Contrast  Result Date: 10/20/2021 CLINICAL DATA:  Head trauma, minor (Age >= 65y); Neck trauma (Age >= 65y) EXAM: CT HEAD WITHOUT CONTRAST CT CERVICAL SPINE WITHOUT CONTRAST TECHNIQUE: Multidetector  CT imaging of the head and cervical spine was performed following the standard protocol without intravenous contrast. Multiplanar CT image reconstructions of the cervical spine were also generated. RADIATION DOSE REDUCTION: This exam was performed according to the departmental dose-optimization program which includes automated exposure control, adjustment of the mA and/or kV according to patient size and/or use of iterative reconstruction technique. COMPARISON:  Head CT 10/10/2020, CT cervical spine 08/05/2019. FINDINGS: CT HEAD FINDINGS Brain: No evidence of acute intracranial hemorrhage or extra-axial collection.No evidence of mass lesion/concerning mass effect.The ventricles are normal in size. Vascular: Vascular calcifications. Skull: Hyperostosis frontalis internus. Negative for skull fracture. Sinuses/Orbits: No acute finding. Other: None. CT CERVICAL SPINE FINDINGS Alignment: Normal. Skull base and vertebrae: No acute cervical spine fracture. No aggressive osseous lesion. Soft tissues and spinal canal: No prevertebral fluid or swelling. No visible canal hematoma. Disc levels: There is mild multilevel degenerative disc disease with moderate severe multilevel facet arthropathy. Upper chest: Negative. Other: None. IMPRESSION: No acute intracranial abnormality. No acute cervical spine fracture. Electronically Signed   By: Maurine Simmering M.D.   On: 10/20/2021 13:53    Microbiology: Results for orders placed or performed during the hospital encounter of 10/20/21   Urine Culture     Status: Abnormal   Collection Time: 10/20/21  5:31 PM   Specimen: Urine, Clean Catch  Result Value Ref Range Status   Specimen Description   Final    URINE, CLEAN CATCH Performed at [facility], [address] [place], [place] [address]    Special Requests   Final    NONE Performed at [facility], [address], [place], [place] [address]    Culture (A)  Final    >=100,000 COLONIES/mL KLEBSIELLA PNEUMONIAE Two isolates with different morphologies were identified as the same organism.The most resistant organism was reported. Performed at [facility], [place] [address], [place], [place] [address]    Report Status 10/23/2021 FINAL  Final   Organism ID, Bacteria KLEBSIELLA PNEUMONIAE (A)  Final      Susceptibility   Klebsiella pneumoniae - MIC*    AMPICILLIN >=32 RESISTANT Resistant     CEFAZOLIN <=4 SENSITIVE Sensitive     CEFEPIME <=0.12 SENSITIVE Sensitive     CEFTRIAXONE <=0.25 SENSITIVE Sensitive     CIPROFLOXACIN <=0.25 SENSITIVE Sensitive     GENTAMICIN <=1 SENSITIVE Sensitive     IMIPENEM <=0.25 SENSITIVE Sensitive     NITROFURANTOIN 128 RESISTANT Resistant     TRIMETH/SULFA <=20 SENSITIVE Sensitive     AMPICILLIN/SULBACTAM 4 SENSITIVE Sensitive     PIP/TAZO <=4 SENSITIVE Sensitive     * >=100,000 COLONIES/mL KLEBSIELLA PNEUMONIAE    Labs: CBC: Recent Labs  Lab 10/20/21 1315 10/24/21 0522  WBC 9.0 7.1  HGB 13.0 12.3  HCT 41.6 39.7  MCV 98.8 99.5  PLT 216 539   Basic Metabolic Panel: Recent Labs  Lab 10/20/21 1315 10/24/21 0522 10/27/21 0510  NA 144 142  --   K 3.9 3.7  --   CL 106 107  --   CO2 27 30  --   GLUCOSE 84 112*  --   BUN 27* 18  --   CREATININE 0.61 0.59 0.52  CALCIUM 9.5 8.6*  --    Liver Function Tests: Recent Labs  Lab 10/24/21 0522  AST 46*  ALT 12  ALKPHOS 46  BILITOT 0.5  PROT 6.3*  ALBUMIN 3.2*   CBG: Recent Labs  Lab 10/23/21 0732 10/24/21 0757 10/25/21 7673  10/26/21 0722 10/27/21 0753  GLUCAP 101* 93 99 120* 98    Discharge time spent: greater than 30 minutes.  Signed: Sharen Hones, MD [facility] 10/27/2021

## 2021-11-01 ENCOUNTER — Ambulatory Visit: Payer: Medicare Other | Admitting: Medical

## 2021-11-01 NOTE — Progress Notes (Signed)
Cardiology Office Note  Date:  11/02/2021   ID:  Theresa Mills, DOB 1950/07/20, MRN [id number]  PCP:  Theresa Osgood, NP   Chief Complaint  Patient presents with   Hospitalization Follow-up    HPI:  [name] is a 71 year old woman with past medical history of Hyperlipidemia Diabetes type 2 Parkinson's Sleep apnea Referred by Dr. Roosevelt Mills for Mobitz type II second-degree AV block  Recent hospitalization September into October 2023 UTI Klebsiella, weakness, sinus bradycardia Treated with antibiotics, discharged to nursing home given weakness  Hospitalist reported short episodes of bradycardia in the afternoon of 9/29 and evening of 9/29.  Patient not on any rate controlling medications. Patient was asymptomatic Per the hospitalist, patient had a transient type II second-degree AVB.   Patient did not have additional episodes of AVB since then.  Zio monitor was requested and placed at the time of discharge October 27, 2021  On today's visit she is unable to stand, required 3 person assist to go to the bathroom.   She is doing therapy at [facility]  Has Zio monitor in place Denies any syncope or near syncope Notes indicating history of sleep apnea  EKG personally reviewed by myself on todays visit NSR rate 74 bpm poor R wave progression to the anterior precordial leads, left axis deviation   PMH:   has a past medical history of Anxiety, Diabetes ([place]), Difficult intravenous access, GERD (gastroesophageal reflux disease), HBP (high blood pressure), Hyperlipidemia, Osteoarthritis, Parkinson disease, Sleep apnea, and Thyroid disease.  PSH:    Past Surgical History:  Procedure Laterality Date   ABDOMINAL HYSTERECTOMY     CATARACT EXTRACTION Bilateral    CHOLECYSTECTOMY     COLONOSCOPY WITH PROPOFOL N/A 04/02/2015   Procedure: COLONOSCOPY WITH PROPOFOL;  Surgeon: Josefine Class, MD;  Location: [facility] ENDOSCOPY;  Service: Endoscopy;  Laterality: N/A;    ESOPHAGOGASTRODUODENOSCOPY (EGD) WITH PROPOFOL N/A 04/02/2015   Procedure: ESOPHAGOGASTRODUODENOSCOPY (EGD) WITH PROPOFOL;  Surgeon: Josefine Class, MD;  Location: [facility] ENDOSCOPY;  Service: Endoscopy;  Laterality: N/A;   GALLBLADDER SURGERY     LAPAROSCOPIC HYSTERECTOMY     THYROIDECTOMY Left    TONSILLECTOMY     TONSILLECTOMY Bilateral     Current Outpatient Medications  Medication Sig Dispense Refill   acetaminophen (TYLENOL 8 HOUR) 650 MG CR tablet Take 650 mg by mouth every 8 (eight) hours as needed for pain.     Calcium Carbonate-Vitamin D 600-200 MG-UNIT TABS Take 1 tablet by mouth daily.      carbidopa-levodopa (SINEMET CR) 50-200 MG tablet Take 1 tablet by mouth 2 (two) times daily.     carbidopa-levodopa (SINEMET IR) 25-100 MG tablet Take 2 tablets by mouth 4 (four) times daily. 720 tablet 3   celecoxib (CELEBREX) 200 MG capsule TAKE 1 CAPSULE BY MOUTH TWO TIMES DAILY AS NEEDED 60 capsule 3   clotrimazole-betamethasone (LOTRISONE) cream APPLY 1 APPLICATION TOPICALLY 2 TIMES DAILY. 45 g 2   diclofenac Sodium (VOLTAREN) 1 % GEL Apply topically 4 (four) times daily. OTC     fexofenadine (ALLEGRA) 180 MG tablet Take 180 mg by mouth daily. OTC (over the counter)     glucose blood test strip TEST BLOOD SUGAR BID     levothyroxine (SYNTHROID) 137 MCG tablet Take 1 tablet (137 mcg total) by mouth daily.     Lidocaine 4 % PTCH Apply 1 patch topically daily. 15 patch 0   melatonin 3 MG TABS tablet Take 1 tablet (3 mg total) by mouth  at bedtime as needed (sleep). 90 tablet 3   metFORMIN (GLUCOPHAGE) 500 MG tablet Take 1 tablet (500 mg total) by mouth 2 (two) times daily. 180 tablet 3   Multiple Vitamins-Minerals (MULTIVITAMIN WOMEN 50+ PO) Take 1 tablet by mouth daily.      omeprazole (PRILOSEC) 40 MG capsule TAKE 1 CAPSULE BY MOUTH EVERY DAY 90 capsule 3   ondansetron (ZOFRAN) 4 MG tablet Take 1 tablet (4 mg total) by mouth every 8 (eight) hours as needed for nausea or vomiting. 20  tablet 5   ONETOUCH DELICA LANCETS 09N MISC TEST BLOOD SUGAR BID     oxyCODONE-acetaminophen (PERCOCET/ROXICET) 5-325 MG tablet Take 1 tablet by mouth every 6 (six) hours as needed for severe pain or moderate pain. 10 tablet 0   polyethylene glycol (MIRALAX / GLYCOLAX) 17 g packet Take 17 g by mouth daily. 14 each 0   rOPINIRole (REQUIP) 2 MG tablet Take 1 tablet (2 mg total) by mouth at bedtime. 90 tablet 3   senna-docusate (SENOKOT-S) 8.6-50 MG tablet Take 1 tablet by mouth at bedtime as needed for mild constipation.     sertraline (ZOLOFT) 100 MG tablet Take 1 tablet (100 mg total) by mouth daily. 90 tablet 3   simvastatin (ZOCOR) 40 MG tablet TAKE ONE TABLET BY MOUTH AT BEDTIME FOR CHOLESTEROL 90 tablet 3   No current facility-administered medications for this visit.    Allergies:   Codeine, Ephadrene [cholestatin], Other, Valium [diazepam], and Ephedrine   Social History:  The patient  reports that she has never smoked. She has never used smokeless tobacco. She reports that she does not drink alcohol and does not use drugs.   Family History:   family history includes Asthma in her father; Cancer in her sister; Diabetes in her maternal uncle; Heart disease in her maternal uncle; Stroke in her maternal uncle.    Review of Systems: Review of Systems  Constitutional: Negative.   HENT: Negative.    Respiratory: Negative.    Cardiovascular: Negative.   Gastrointestinal: Negative.   Musculoskeletal: Negative.   Neurological: Negative.   Psychiatric/Behavioral: Negative.    All other systems reviewed and are negative.    PHYSICAL EXAM: VS:  BP 130/80 (BP Location: Right Arm, Patient Position: Sitting, Cuff Size: Large)   Pulse 74   Ht '5\' 3"'$  (1.6 m)   Wt 190 lb (86.2 kg)   SpO2 98%   BMI 33.66 kg/m  , BMI Body mass index is 33.66 kg/m. GEN: Well nourished, well developed, in no acute distress HEENT: normal Neck: no JVD, carotid bruits, or masses Cardiac: RRR; no murmurs,  rubs, or gallops,no edema  Respiratory:  clear to auscultation bilaterally, normal work of breathing GI: soft, nontender, nondistended, + BS MS: no deformity or atrophy Skin: warm and dry, no rash Neuro:  Strength and sensation are intact Psych: euthymic mood, full affect   Recent Labs: 10/20/2021: TSH 7.503 10/24/2021: ALT 12; BUN 18; Hemoglobin 12.3; Platelets 204; Potassium 3.7; Sodium 142 10/27/2021: Creatinine, Ser 0.52    Lipid Panel Lab Results  Component Value Date   CHOL 135 05/21/2020   HDL 51 05/21/2020   LDLCALC 68 05/21/2020   TRIG 84 05/21/2020      Wt Readings from Last 3 Encounters:  11/02/21 190 lb (86.2 kg)  10/20/21 190 lb 4.1 oz (86.3 kg)  10/20/21 190 lb 12.8 oz (86.5 kg)      ASSESSMENT AND PLAN:  Problem List Items Addressed This Visit  Cardiology Problems   Essential hypertension   Hyperlipidemia, unspecified     Other   UTI due to Klebsiella species   Obesity (BMI 30-39.9)   Frequent falls   Other Visit Diagnoses     Bradycardia    -  Primary   Relevant Orders   EKG 12-Lead      Sinus bradycardia Rare brief episodes noted in the hospital October 21, 2021, patient was asymptomatic Minimally active at this time, predominantly sedentary at rehab facility Normal EKG Zio monitor in place We have downloaded 1 week of Zio monitor data, no arrhythmia noted Recommend she turn her Zio monitor into the company on October 19 for full evaluation We will call her with the results  Debility Currently participating in [facility]hab at [facility] She reports recent falls, ligamental injury left forearm, legs weak, difficulty standing without assistance She required 3 person assistance to have bowel movement in the office bathroom today  UTI sepsis Recent hospitalization, events reviewed   Total encounter time more than 50 minutes  Greater than 50% was spent in counseling and coordination of care with the patient    Signed, Esmond Plants, M.D., Ph.D. [place], [place]

## 2021-11-02 ENCOUNTER — Ambulatory Visit: Payer: Medicare Other | Attending: Cardiovascular Disease | Admitting: Cardiovascular Disease

## 2021-11-02 ENCOUNTER — Encounter: Payer: Self-pay | Admitting: Cardiovascular Disease

## 2021-11-02 VITALS — BP 130/80 | HR 74 | Ht 63.0 in | Wt 190.0 lb

## 2021-11-02 DIAGNOSIS — B9689 Other specified bacterial agents as the cause of diseases classified elsewhere: Secondary | ICD-10-CM | POA: Insufficient documentation

## 2021-11-02 DIAGNOSIS — R296 Repeated falls: Secondary | ICD-10-CM | POA: Diagnosis present

## 2021-11-02 DIAGNOSIS — E782 Mixed hyperlipidemia: Secondary | ICD-10-CM

## 2021-11-02 DIAGNOSIS — E669 Obesity, unspecified: Secondary | ICD-10-CM

## 2021-11-02 DIAGNOSIS — N390 Urinary tract infection, site not specified: Secondary | ICD-10-CM

## 2021-11-02 DIAGNOSIS — I10 Essential (primary) hypertension: Secondary | ICD-10-CM

## 2021-11-02 DIAGNOSIS — R001 Bradycardia, unspecified: Secondary | ICD-10-CM

## 2021-11-02 NOTE — Patient Instructions (Addendum)
2 week monitor was placed 10/27/21 At the end of 2 weeks (11/10/21), we need to send back ZIO monitor to company (box is on her bedside table)   Medication Instructions:  No changes  If you need a refill on your cardiac medications before your next appointment, please call your pharmacy.    Lab work: No new labs needed   Testing/Procedures: No new testing needed   Follow-Up: At [facility], you and your health needs are our priority.  As part of our continuing mission to provide you with exceptional heart care, we have created designated Provider Care Teams.  These Care Teams include your primary Cardiologist (physician) and Advanced Practice Providers (APPs -  Physician Assistants and Nurse Practitioners) who all work together to provide you with the care you need, when you need it.  You will need a follow up appointment as needed  Providers on your designated Care Team:   Murray Hodgkins, NP Christell Faith, PA-C Cadence Kathlen Mody, [place]  COVID-19 Vaccine Information can be found at: [email or web address].uk For questions related to vaccine distribution or appointments, please email [email or web address] or call [phone].

## 2021-11-13 ENCOUNTER — Encounter: Payer: Self-pay | Admitting: Nurse Practitioner

## 2021-11-24 ENCOUNTER — Telehealth: Payer: Self-pay | Admitting: *Deleted

## 2021-11-24 NOTE — Telephone Encounter (Signed)
-----   Message from Rise Mu, PA-C sent at 11/21/2021 10:06 AM EDT ----- Heart monitor showed a predominant rhythm of sinus with an average rate of 67 bpm (range 48 to 154 bpm), 119 episodes of SVT (fast rhythm coming from the top portion of the heart) with the fastest interval lasting 8 beats with a maximum rate of 154 bpm and the longest interval lasting 16.8 seconds, rare extra beats from the top and bottom portions of the heart.  No patient triggered events were identified.  Recommendations: -Preliminary monitor data was discussed with patient by primary cardiologist earlier this month -Due to history of bradycardia, defer addition of rate controlling medication at this time -It looks like her last follow-up was noted to be as needed, she should follow-up in 6 months time to assess if further testing or intervention is indicated based on SVT noted on outpatient cardiac monitor

## 2021-11-24 NOTE — Telephone Encounter (Signed)
Phone just rang with no voicemail

## 2021-12-01 NOTE — Telephone Encounter (Addendum)
Results were reviewed with patient and 6 month recall has been placed.

## 2022-01-12 ENCOUNTER — Ambulatory Visit: Payer: Medicare Other | Admitting: Nurse Practitioner

## 2022-02-16 ENCOUNTER — Telehealth: Payer: Self-pay

## 2022-02-16 NOTE — Telephone Encounter (Signed)
[place] [phone] called [facility] and they will fax [place] to sign

## 2022-03-01 ENCOUNTER — Telehealth: Payer: Self-pay | Admitting: Nurse Practitioner

## 2022-03-01 NOTE — Telephone Encounter (Signed)
Received 02/27/22 [facility] order. Gave to [place] for signature-nm

## 2022-03-03 ENCOUNTER — Telehealth: Payer: Self-pay | Admitting: Nurse Practitioner

## 2022-03-03 NOTE — Telephone Encounter (Signed)
Received 02/15/22 verbal SOC order from [employer]. Gave to Alyssa for signature-Toni

## 2022-03-13 ENCOUNTER — Telehealth: Payer: Self-pay | Admitting: Nurse Practitioner

## 2022-03-13 NOTE — Telephone Encounter (Signed)
[place] order signed. Faxed back; [phone]. To be scanned-nm

## 2022-03-13 NOTE — Telephone Encounter (Signed)
Received 02/16/22 [facility] order. Faxed back; [phone]. To be scanned-nm

## 2022-03-16 ENCOUNTER — Telehealth: Payer: Self-pay | Admitting: Nurse Practitioner

## 2022-03-16 NOTE — Telephone Encounter (Signed)
Error

## 2022-04-12 ENCOUNTER — Telehealth: Payer: Self-pay | Admitting: Nurse Practitioner

## 2022-04-12 NOTE — Telephone Encounter (Signed)
Received [facility] order. Gave to AA for signature-nm

## 2022-04-12 NOTE — Telephone Encounter (Signed)
[facility] note report order signed. Faxed back; [phone]. To be scanned

## 2022-04-14 ENCOUNTER — Telehealth: Payer: Self-pay

## 2022-04-14 NOTE — Telephone Encounter (Signed)
Zacharia from [facility] called for OT exercise/activity for once a week for 5 weeks. [phone]

## 2022-04-17 NOTE — Telephone Encounter (Signed)
Error

## 2022-04-19 ENCOUNTER — Telehealth: Payer: Self-pay | Admitting: Nurse Practitioner

## 2022-04-19 NOTE — Telephone Encounter (Signed)
Received 04/12/2022 [facility] home health order. Gave to [place] for signature-nm

## 2022-05-01 ENCOUNTER — Telehealth: Payer: Self-pay | Admitting: Nurse Practitioner

## 2022-05-01 NOTE — Telephone Encounter (Signed)
[facility] home health order signed. Faxed back; [phone]. To be scanned

## 2022-05-19 ENCOUNTER — Telehealth: Payer: Self-pay | Admitting: Nurse Practitioner

## 2022-05-19 NOTE — Telephone Encounter (Signed)
Received [facility] home health order form 05/15/2022 for AA signature. [facility] order form signed. Faxed back; [phone]. To be scanned-nm

## 2022-06-22 ENCOUNTER — Telehealth: Payer: Self-pay | Admitting: Nurse Practitioner

## 2022-06-22 NOTE — Telephone Encounter (Signed)
06/13/22 PT order faxed back to [facility]; [phone]. Scanned-Toni

## 2022-09-18 ENCOUNTER — Ambulatory Visit: Payer: Medicare Other | Admitting: Nurse Practitioner
